# Patient Record
Sex: Male | Born: 1955 | Race: White | Hispanic: No | Marital: Single | Smoking: Former smoker
Health system: Southern US, Community
[De-identification: ages and names within clinical notes are randomized; demographics above are authoritative.]

## PROBLEM LIST (undated history)

## (undated) DIAGNOSIS — A159 Respiratory tuberculosis unspecified: Secondary | ICD-10-CM

## (undated) DIAGNOSIS — Z9221 Personal history of antineoplastic chemotherapy: Secondary | ICD-10-CM

## (undated) DIAGNOSIS — I252 Old myocardial infarction: Secondary | ICD-10-CM

## (undated) DIAGNOSIS — IMO0002 Reserved for concepts with insufficient information to code with codable children: Secondary | ICD-10-CM

## (undated) DIAGNOSIS — I2699 Other pulmonary embolism without acute cor pulmonale: Secondary | ICD-10-CM

## (undated) DIAGNOSIS — Z9981 Dependence on supplemental oxygen: Secondary | ICD-10-CM

## (undated) DIAGNOSIS — J45909 Unspecified asthma, uncomplicated: Secondary | ICD-10-CM

## (undated) DIAGNOSIS — IMO0001 Reserved for inherently not codable concepts without codable children: Secondary | ICD-10-CM

## (undated) DIAGNOSIS — I2510 Atherosclerotic heart disease of native coronary artery without angina pectoris: Secondary | ICD-10-CM

## (undated) DIAGNOSIS — C3490 Malignant neoplasm of unspecified part of unspecified bronchus or lung: Secondary | ICD-10-CM

## (undated) DIAGNOSIS — E669 Obesity, unspecified: Secondary | ICD-10-CM

## (undated) DIAGNOSIS — T66XXXA Radiation sickness, unspecified, initial encounter: Secondary | ICD-10-CM

## (undated) DIAGNOSIS — E785 Hyperlipidemia, unspecified: Secondary | ICD-10-CM

## (undated) DIAGNOSIS — K208 Other esophagitis without bleeding: Secondary | ICD-10-CM

## (undated) DIAGNOSIS — N189 Chronic kidney disease, unspecified: Secondary | ICD-10-CM

## (undated) DIAGNOSIS — I214 Non-ST elevation (NSTEMI) myocardial infarction: Secondary | ICD-10-CM

## (undated) DIAGNOSIS — E119 Type 2 diabetes mellitus without complications: Secondary | ICD-10-CM

## (undated) DIAGNOSIS — I10 Essential (primary) hypertension: Secondary | ICD-10-CM

## (undated) DIAGNOSIS — R229 Localized swelling, mass and lump, unspecified: Secondary | ICD-10-CM

## (undated) DIAGNOSIS — Z8673 Personal history of transient ischemic attack (TIA), and cerebral infarction without residual deficits: Secondary | ICD-10-CM

## (undated) DIAGNOSIS — J189 Pneumonia, unspecified organism: Secondary | ICD-10-CM

## (undated) DIAGNOSIS — J449 Chronic obstructive pulmonary disease, unspecified: Secondary | ICD-10-CM

## (undated) HISTORY — DX: Reserved for concepts with insufficient information to code with codable children: IMO0002

## (undated) HISTORY — DX: Localized swelling, mass and lump, unspecified: R22.9

## (undated) HISTORY — DX: Hyperlipidemia, unspecified: E78.5

## (undated) HISTORY — DX: Other pulmonary embolism without acute cor pulmonale: I26.99

## (undated) HISTORY — DX: Atherosclerotic heart disease of native coronary artery without angina pectoris: I25.10

## (undated) HISTORY — DX: Old myocardial infarction: I25.2

## (undated) HISTORY — DX: Type 2 diabetes mellitus without complications: E11.9

## (undated) HISTORY — DX: Non-ST elevation (NSTEMI) myocardial infarction: I21.4

## (undated) HISTORY — DX: Personal history of transient ischemic attack (TIA), and cerebral infarction without residual deficits: Z86.73

## (undated) HISTORY — DX: Other esophagitis: K20.8

## (undated) HISTORY — DX: Chronic obstructive pulmonary disease, unspecified: J44.9

## (undated) HISTORY — DX: Obesity, unspecified: E66.9

## (undated) HISTORY — DX: Malignant neoplasm of unspecified part of unspecified bronchus or lung: C34.90

## (undated) HISTORY — DX: Dependence on supplemental oxygen: Z99.81

## (undated) HISTORY — DX: Chronic kidney disease, unspecified: N18.9

## (undated) HISTORY — DX: Other esophagitis without bleeding: K20.80

## (undated) HISTORY — DX: Radiation sickness, unspecified, initial encounter: T66.XXXA

---

## 1997-08-23 ENCOUNTER — Emergency Department (HOSPITAL_COMMUNITY): Admission: EM | Admit: 1997-08-23 | Discharge: 1997-08-23 | Payer: Self-pay | Admitting: Emergency Medicine

## 1998-11-12 ENCOUNTER — Encounter: Payer: Self-pay | Admitting: Emergency Medicine

## 1998-11-12 ENCOUNTER — Emergency Department (HOSPITAL_COMMUNITY): Admission: EM | Admit: 1998-11-12 | Discharge: 1998-11-12 | Payer: Self-pay | Admitting: Emergency Medicine

## 2001-12-10 DIAGNOSIS — I679 Cerebrovascular disease, unspecified: Secondary | ICD-10-CM | POA: Insufficient documentation

## 2001-12-28 ENCOUNTER — Inpatient Hospital Stay (HOSPITAL_COMMUNITY): Admission: EM | Admit: 2001-12-28 | Discharge: 2001-12-30 | Payer: Self-pay | Admitting: *Deleted

## 2001-12-28 ENCOUNTER — Encounter: Payer: Self-pay | Admitting: Pediatrics

## 2001-12-28 ENCOUNTER — Encounter: Payer: Self-pay | Admitting: Emergency Medicine

## 2001-12-28 ENCOUNTER — Encounter (INDEPENDENT_AMBULATORY_CARE_PROVIDER_SITE_OTHER): Payer: Self-pay | Admitting: Cardiology

## 2001-12-30 ENCOUNTER — Encounter: Payer: Self-pay | Admitting: Internal Medicine

## 2005-05-12 DIAGNOSIS — Z8673 Personal history of transient ischemic attack (TIA), and cerebral infarction without residual deficits: Secondary | ICD-10-CM

## 2005-05-12 HISTORY — DX: Personal history of transient ischemic attack (TIA), and cerebral infarction without residual deficits: Z86.73

## 2005-07-23 DIAGNOSIS — Z87891 Personal history of nicotine dependence: Secondary | ICD-10-CM | POA: Insufficient documentation

## 2005-07-23 DIAGNOSIS — J45909 Unspecified asthma, uncomplicated: Secondary | ICD-10-CM | POA: Insufficient documentation

## 2006-08-05 ENCOUNTER — Ambulatory Visit: Payer: Self-pay | Admitting: Cardiology

## 2006-08-05 ENCOUNTER — Inpatient Hospital Stay (HOSPITAL_COMMUNITY): Admission: EM | Admit: 2006-08-05 | Discharge: 2006-08-08 | Payer: Self-pay | Admitting: Emergency Medicine

## 2006-08-17 ENCOUNTER — Ambulatory Visit: Payer: Self-pay | Admitting: Cardiology

## 2006-10-17 ENCOUNTER — Ambulatory Visit: Payer: Self-pay | Admitting: Cardiology

## 2006-10-17 ENCOUNTER — Other Ambulatory Visit: Payer: Self-pay

## 2006-10-17 ENCOUNTER — Inpatient Hospital Stay: Payer: Self-pay | Admitting: Internal Medicine

## 2006-11-16 ENCOUNTER — Ambulatory Visit: Payer: Self-pay | Admitting: Cardiology

## 2007-05-13 HISTORY — PX: CORONARY STENT PLACEMENT: SHX1402

## 2007-05-13 HISTORY — PX: CARDIAC CATHETERIZATION: SHX172

## 2007-07-08 ENCOUNTER — Ambulatory Visit: Payer: Self-pay | Admitting: Cardiology

## 2008-01-04 ENCOUNTER — Ambulatory Visit: Payer: Self-pay | Admitting: Cardiology

## 2008-10-23 ENCOUNTER — Ambulatory Visit: Payer: Self-pay | Admitting: Cardiology

## 2008-10-23 ENCOUNTER — Inpatient Hospital Stay: Payer: Self-pay | Admitting: Internal Medicine

## 2008-11-02 DIAGNOSIS — I10 Essential (primary) hypertension: Secondary | ICD-10-CM

## 2008-11-03 ENCOUNTER — Encounter: Payer: Self-pay | Admitting: Cardiology

## 2008-11-03 ENCOUNTER — Ambulatory Visit: Payer: Self-pay | Admitting: Cardiology

## 2008-11-03 DIAGNOSIS — E785 Hyperlipidemia, unspecified: Secondary | ICD-10-CM | POA: Insufficient documentation

## 2008-11-03 DIAGNOSIS — Z9861 Coronary angioplasty status: Secondary | ICD-10-CM

## 2008-11-03 DIAGNOSIS — I2510 Atherosclerotic heart disease of native coronary artery without angina pectoris: Secondary | ICD-10-CM

## 2008-11-20 ENCOUNTER — Encounter: Payer: Self-pay | Admitting: Cardiovascular Disease

## 2008-11-24 ENCOUNTER — Telehealth: Payer: Self-pay | Admitting: Cardiology

## 2008-12-04 ENCOUNTER — Telehealth: Payer: Self-pay | Admitting: Cardiology

## 2008-12-29 ENCOUNTER — Telehealth: Payer: Self-pay | Admitting: Cardiology

## 2009-03-05 ENCOUNTER — Telehealth: Payer: Self-pay | Admitting: Cardiology

## 2009-03-26 ENCOUNTER — Telehealth: Payer: Self-pay | Admitting: Cardiology

## 2009-07-02 ENCOUNTER — Telehealth: Payer: Self-pay | Admitting: Cardiology

## 2009-07-11 ENCOUNTER — Telehealth: Payer: Self-pay | Admitting: Cardiology

## 2009-09-07 ENCOUNTER — Ambulatory Visit: Payer: Self-pay | Admitting: Cardiology

## 2009-09-12 ENCOUNTER — Ambulatory Visit: Payer: Self-pay | Admitting: Cardiology

## 2009-09-12 DIAGNOSIS — E119 Type 2 diabetes mellitus without complications: Secondary | ICD-10-CM | POA: Insufficient documentation

## 2009-09-18 LAB — CONVERTED CEMR LAB
ALT: 16 units/L (ref 0–53)
AST: 14 units/L (ref 0–37)
Cholesterol: 105 mg/dL (ref 0–200)
Creatinine, Ser: 1.58 mg/dL — ABNORMAL HIGH (ref 0.40–1.50)
HDL: 30 mg/dL — ABNORMAL LOW (ref >39)
Hgb A1c MFr Bld: 7.1 % — ABNORMAL HIGH (ref <5.7)
Total Bilirubin: 0.4 mg/dL (ref 0.3–1.2)
Total CHOL/HDL Ratio: 3.5
VLDL: 22 mg/dL (ref 0–40)

## 2009-10-06 ENCOUNTER — Ambulatory Visit: Payer: Self-pay | Admitting: Critical Care Medicine

## 2009-10-06 ENCOUNTER — Inpatient Hospital Stay (HOSPITAL_COMMUNITY): Admission: EM | Admit: 2009-10-06 | Discharge: 2009-10-11 | Payer: Self-pay | Admitting: Emergency Medicine

## 2009-10-09 ENCOUNTER — Encounter: Payer: Self-pay | Admitting: Cardiology

## 2009-10-10 ENCOUNTER — Encounter (INDEPENDENT_AMBULATORY_CARE_PROVIDER_SITE_OTHER): Payer: Self-pay | Admitting: Internal Medicine

## 2009-11-14 ENCOUNTER — Ambulatory Visit: Payer: Self-pay | Admitting: Internal Medicine

## 2009-11-14 DIAGNOSIS — Z87891 Personal history of nicotine dependence: Secondary | ICD-10-CM

## 2009-11-19 ENCOUNTER — Telehealth (INDEPENDENT_AMBULATORY_CARE_PROVIDER_SITE_OTHER): Payer: Self-pay | Admitting: *Deleted

## 2009-11-21 ENCOUNTER — Telehealth: Payer: Self-pay | Admitting: Cardiovascular Disease

## 2009-11-22 ENCOUNTER — Ambulatory Visit: Payer: Self-pay | Admitting: Cardiovascular Disease

## 2009-11-23 ENCOUNTER — Encounter: Payer: Self-pay | Admitting: Cardiovascular Disease

## 2009-11-29 ENCOUNTER — Telehealth: Payer: Self-pay | Admitting: Internal Medicine

## 2009-12-06 ENCOUNTER — Ambulatory Visit: Payer: Self-pay | Admitting: Internal Medicine

## 2009-12-31 ENCOUNTER — Telehealth (INDEPENDENT_AMBULATORY_CARE_PROVIDER_SITE_OTHER): Payer: Self-pay | Admitting: *Deleted

## 2010-01-03 ENCOUNTER — Ambulatory Visit: Payer: Self-pay | Admitting: Internal Medicine

## 2010-01-07 ENCOUNTER — Ambulatory Visit: Payer: Self-pay | Admitting: Internal Medicine

## 2010-01-10 ENCOUNTER — Telehealth: Payer: Self-pay | Admitting: Internal Medicine

## 2010-01-17 ENCOUNTER — Ambulatory Visit (HOSPITAL_COMMUNITY): Admission: RE | Admit: 2010-01-17 | Discharge: 2010-01-17 | Payer: Self-pay | Admitting: Internal Medicine

## 2010-01-17 ENCOUNTER — Telehealth: Payer: Self-pay | Admitting: Internal Medicine

## 2010-01-23 ENCOUNTER — Telehealth: Payer: Self-pay | Admitting: Internal Medicine

## 2010-01-24 ENCOUNTER — Telehealth: Payer: Self-pay | Admitting: Internal Medicine

## 2010-01-25 ENCOUNTER — Telehealth: Payer: Self-pay | Admitting: Internal Medicine

## 2010-01-25 ENCOUNTER — Encounter: Payer: Self-pay | Admitting: Internal Medicine

## 2010-01-30 ENCOUNTER — Telehealth: Payer: Self-pay | Admitting: Internal Medicine

## 2010-02-01 ENCOUNTER — Telehealth: Payer: Self-pay | Admitting: Internal Medicine

## 2010-02-01 ENCOUNTER — Ambulatory Visit: Payer: Self-pay | Admitting: Internal Medicine

## 2010-02-06 ENCOUNTER — Ambulatory Visit (HOSPITAL_COMMUNITY): Admission: RE | Admit: 2010-02-06 | Discharge: 2010-02-06 | Payer: Self-pay | Admitting: Emergency Medicine

## 2010-02-08 ENCOUNTER — Telehealth: Payer: Self-pay | Admitting: Emergency Medicine

## 2010-02-11 ENCOUNTER — Telehealth: Payer: Self-pay | Admitting: Internal Medicine

## 2010-02-20 ENCOUNTER — Telehealth: Payer: Self-pay | Admitting: Internal Medicine

## 2010-02-25 ENCOUNTER — Telehealth: Payer: Self-pay | Admitting: Internal Medicine

## 2010-03-01 ENCOUNTER — Encounter: Payer: Self-pay | Admitting: Internal Medicine

## 2010-03-01 ENCOUNTER — Telehealth: Payer: Self-pay | Admitting: Internal Medicine

## 2010-03-04 ENCOUNTER — Ambulatory Visit: Payer: Self-pay | Admitting: Cardiology

## 2010-03-07 ENCOUNTER — Ambulatory Visit: Payer: Self-pay | Admitting: Internal Medicine

## 2010-03-19 ENCOUNTER — Encounter: Payer: Self-pay | Admitting: Internal Medicine

## 2010-03-19 ENCOUNTER — Telehealth: Payer: Self-pay | Admitting: Cardiology

## 2010-03-19 ENCOUNTER — Ambulatory Visit: Payer: Self-pay | Admitting: Thoracic Surgery

## 2010-03-28 HISTORY — PX: WEDGE RESECTION: SHX5070

## 2010-03-29 ENCOUNTER — Encounter: Payer: Self-pay | Admitting: Thoracic Surgery

## 2010-03-29 ENCOUNTER — Ambulatory Visit: Payer: Self-pay | Admitting: Thoracic Surgery

## 2010-03-29 ENCOUNTER — Encounter: Payer: Self-pay | Admitting: Internal Medicine

## 2010-03-29 ENCOUNTER — Inpatient Hospital Stay (HOSPITAL_COMMUNITY): Admission: RE | Admit: 2010-03-29 | Discharge: 2010-04-03 | Payer: Self-pay | Admitting: Thoracic Surgery

## 2010-04-09 ENCOUNTER — Telehealth: Payer: Self-pay | Admitting: Internal Medicine

## 2010-04-10 ENCOUNTER — Encounter: Admission: RE | Admit: 2010-04-10 | Discharge: 2010-04-10 | Payer: Self-pay | Admitting: Thoracic Surgery

## 2010-04-10 ENCOUNTER — Ambulatory Visit: Payer: Self-pay | Admitting: Thoracic Surgery

## 2010-04-16 ENCOUNTER — Ambulatory Visit: Payer: Self-pay | Admitting: Cardiology

## 2010-05-01 ENCOUNTER — Encounter: Admission: RE | Admit: 2010-05-01 | Payer: Self-pay | Source: Home / Self Care | Admitting: Thoracic Surgery

## 2010-05-01 ENCOUNTER — Ambulatory Visit: Payer: Self-pay | Admitting: Thoracic Surgery

## 2010-05-02 ENCOUNTER — Ambulatory Visit: Payer: Self-pay | Admitting: Internal Medicine

## 2010-05-14 ENCOUNTER — Ambulatory Visit: Admit: 2010-05-14 | Payer: Self-pay

## 2010-06-11 ENCOUNTER — Other Ambulatory Visit: Payer: Self-pay | Admitting: Thoracic Surgery

## 2010-06-11 DIAGNOSIS — R0789 Other chest pain: Secondary | ICD-10-CM

## 2010-06-11 DIAGNOSIS — D381 Neoplasm of uncertain behavior of trachea, bronchus and lung: Secondary | ICD-10-CM

## 2010-06-12 ENCOUNTER — Ambulatory Visit: Admit: 2010-06-12 | Payer: Self-pay | Admitting: Thoracic Surgery

## 2010-06-12 ENCOUNTER — Ambulatory Visit: Payer: 59 | Admitting: Thoracic Surgery

## 2010-06-12 ENCOUNTER — Ambulatory Visit
Admission: RE | Admit: 2010-06-12 | Discharge: 2010-06-12 | Disposition: A | Discharge status: Home / Self Care | Payer: 59 | Source: Ambulatory Visit | Attending: Thoracic Surgery | Admitting: Thoracic Surgery

## 2010-06-12 DIAGNOSIS — J8410 Pulmonary fibrosis, unspecified: Secondary | ICD-10-CM

## 2010-06-12 DIAGNOSIS — D381 Neoplasm of uncertain behavior of trachea, bronchus and lung: Secondary | ICD-10-CM

## 2010-06-13 NOTE — Progress Notes (Signed)
Summary: CPST ok  and needs ENB-  Phone Note Outgoing Call   Summary of Call: I gave cPST report. Acceptable lobecotmy canddiate. D/w Dr. Edwyna Shell who will see scan and decide if ENB First or direct lobectomy first. If this is cancer, patient prefers lobectomy. I will call patient back once I hear back from Dr. Edwyna Shell Initial call taken by: Kalman Shan MD,  February 01, 2010 2:53 PM  Follow-up for Phone Call        heard back from Dr. Edwyna Shell - he wants [place] to ENB bx on wednesday 02/06/2010. Patient should hear from [facility]. Pls check with Forestine Na if she has patient details and if he is all set Follow-up by: Kalman Shan MD,  February 04, 2010 9:15 AM  Additional Follow-up for Phone Call Additional follow up Details #1::        I spoke to pt and he staets he has not heard from anyone regarding biopsy. I advised I will call Forestine Na and find out when pt will be contacted. Carron Curie CMA  February 05, 2010 8:20 AM  Pt called back again. wants to hear back from someone ASAP regarding same. [phone]. Tivis Ringer, CNA  February 05, 2010 1:02 PM    Additional Follow-up for Phone Call Additional follow up Details #2::    Spoke to [facility] and she states I need to contact Short Stay at [phone] to ask about pre-op and appt time. I called and they instructed me that pre-op is today at 3pm and procedure is tomorrow at 8:30am. I called pt and made him aware of each appt.Carron Curie, 02-05-10 at 1:18pm.

## 2010-06-13 NOTE — Letter (Signed)
Summary: CPST [facility] Pul[facility]  [address]   [place], [place] [address]   Phone: [phone]  Fax: [phone]     Patient's Name: [name] Date of Birth: [birth year] MRN: [id number]  CPST  Choose test method and choice  a)__x_Bike - recommended by ATS/ACCP. Do at [facility] at Dr. Gala Romney [facility]  b)___Treadmill - less preferred. Do at [facility] at Dr. Gala Romney lab or do at [facility] lab  Choose one or more indication for test  INDICATIONS FOR CARDIOPULMONARY EXERCISE TESTING Evaluation of exercise tolerance ______ Determination of functional impairment or capacity (peak V? O2) ______ Determination of exercise-limiting factors and pathophysiologic mechanisms  Evaluation of undiagnosed exercise intolerance _____ Assessing contribution of cardiac and pulmonary etiology in coexisting disease _____ Symptoms disproportionate to resting pulmonary and cardiac tests  _____Unexplained dyspnea when initial cardiopulmonary testing is nondiagnostic  Evaluation of patients with cardiovascular disease _____ Functional evaluation and prognosis in patients with heart failure _____ Selection for cardiac transplantation _____ Exercise prescription and monitoring response to exercise training for cardiac rehabilitation (special circumstances; i.e., pacemakers)  Evaluation of patients with respiratory disease _____ Functional impairment assessment (see specific clinical applications)  _____Chronic obstructive pulmonary disease Establishing exercise limitation(s) and assessing other potential contributing factors, especially occult heart disease (ischemia) ______Determination of magnitude of hypoxemia and for O2 prescription When objective determination of therapeutic intervention is necessary and not adequately addressed by standard pulmonary function testing  _____ Interstitial lung diseases _____Detection of early (occult) gas exchange abnormalities _____Overall  assessment/monitoring of pulmonary gas exchange _____Determination of magnitude of hypoxemia and for O2 prescription _____Determination of potential exercise-limiting factors _____Documentation of therapeutic response to potentially toxic therapy  ____ Pulmonary vascular disease (careful risk-benefit analysis required)  ____ Cystic fibrosis  ____ Exercise-induced bronchospasm  Specific clinical applications ___x_  Preoperative evaluation __x___Lung resectional surgery _____Elderly patients undergoing major abdominal surgery _____Lung volume resectional surgery for emphysema (currently investigational)  ____ Exercise evaluation and prescription for pulmonary rehabilitation  _x___ Evaluation for impairment-disability  ____ Evaluation for lung, heart-lung transplantation ____ Definition of abbreviation: V? O2______ -oxygen consumption.    Kalman Shan MD    [facility]

## 2010-06-13 NOTE — Progress Notes (Signed)
Summary: needs PET scan  Phone Note Outgoing Call   Summary of Call: I called him to let him know that we discused at [facility] today and they recommended PET scan. He was not at home and [facility]. If he calls back, pls state the above. I hve placed order inPET Scan. I am sending this message to Lorenda Ishihara, [facility] and triage so all of you know and there is no confusion. '  If he wants to talk to me, pls get a cell number and I can call him late afternoon today Initial call taken by: Kalman Shan MD,  January 10, 2010 10:08 AM  Follow-up for Phone Call        attempted to reach patient agin. LMTCB. Needs PET scan. Order done Follow-up by: Kalman Shan MD,  January 11, 2010 3:31 PM  Additional Follow-up for Phone Call Additional follow up Details #1::        pt aware of PET scan.Carron Curie CMA  January 16, 2010 1:10 PM

## 2010-06-13 NOTE — Assessment & Plan Note (Signed)
Summary: 1 mth follow-up//jrc   Visit Type:  Follow-up Copy to:  Dr. [name] Primary [name]/Referring [name]:  Kevin Merry, MD  CC:  1 month follow-up after procedure. Marland Kitchen  History of Present Illness: RUL PET HOT intermediate prob nodule - ENB nondiagnostic 02/06/2010.  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 Marland Kitchen EX-smoker - quit may 2011. Fnctional status  - Vo2 max 32ml/kg/min on CPST 02/01/2010   March 07, 2010. Follow up after biipsy and tests. ENB 02/06/2010 was indeterminate. Oncimmune panel 03/01/2010 ws negagtive. He feels well and at baseline. He is very frustrated about the nodule. He understands the non-diagnostic results of the biopsy and uncertainty. States family friend was monitored for years for nodule and it ended up with stage 4 cancer and a close relative was operated upon for nodule and was negative for malignancy. Therefore, he is torn if he wants this monitored or removed. He is aware of postoperative risk morbidity and mortality and potential need for O2 and loss of lung function if he has lobectomey. No new complaints.  REC: EVAL WITH DR Edwyna Shell   May 02, 2010: Foloowup for above. s/p RUL wede resection 03/28/2010. 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 We dicussed potential etiologies for granuloma. He admits to extensive travel to [place], [place] few years ago. Currenty feels well other than neuropathic  pan from incisional site. Dyspnea, cough are stable. No new issues. Smoking still in remission. No fever. No chills, No hemoptysis   Preventive Screening-Counseling & Management  Alcohol-Tobacco     Smoking Status: quit < 6 months     Smoking Cessation Counseling: no     Smoke Cessation Stage: quit     Packs/Day: 1.0     Year Started: 1963     Year Quit: May 2011     Pack years: 55     Tobacco Counseling: not to resume use of tobacco products  Current Medications (verified): 1)  Lipitor  40 Mg Tabs (Atorvastatin Calcium) .... Take 1 Tab By Mouth At Bedtime 2)  Aspirin Ec 325 Mg Tbec (Aspirin) .... Take One Tablet By Mouth Daily 3)  Hydrochlorothiazide 12.5 Mg Caps (Hydrochlorothiazide) .... Take 1 Tablet By Mouth Once A Day 4)  Plavix 75 Mg Tabs (Clopidogrel Bisulfate) .... Take One Tablet By Mouth Daily 5)  Metoprolol Tartrate 25 Mg Tabs (Metoprolol Tartrate) .... Take One Tablet By Mouth Twice A Day 6)  Glipizide 5 Mg Tabs (Glipizide) .... Take 1 Tablet By Mouth Two Times A Day 7)  Nitrostat 0.4 Mg Subl (Nitroglycerin) .Marland Kitchen.. 1 Tablet Under Tongue At Onset of Chest Pain; You May Repeat Every 5 Minutes For Up To 3 Doses. 8)  Ventolin Hfa 108 (90 Base) Mcg/act Aers (Albuterol Sulfate) .Marland Kitchen.. 1-2 Puffs Four Times Daily As Needed 9)  Benicar 20 Mg Tabs (Olmesartan Medoxomil) .... Take 1 Tablet By Mouth Once A Day 10)  Atrovent Hfa 17 Mcg/act Aers (Ipratropium Bromide Hfa) .... 2 Puffs Four Times Daily  Allergies (verified): 1)  ! Pcn  Past History:  Past medical, surgical, family and social histories (including risk factors) reviewed, and no changes noted (except as noted below).  Past Medical History: 1.  CAD:  PCI 3/08 to OM2 and mid CFX.  NSTEMI 6/10.  Prior stents patent.  90% distal CFX, totally occluded mRCA (old) with collaterals.  EF 55% on LV-gram.  Xience DES 2.5 x 23 mm to distal CFX.  2.  DM2 3.  Hyperlipidemia 4.  COPD :   - quit tobacco 5/11.    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 Marchelle Gearing - Aug 2011 - started on atrovent HFA - fall 2011. No desaturation on walk test Dec 2011 5.  CKD, last creatinine 1.6 6.  obesity 7..  History of CVA without residual deficits 8.  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  -  Rx  for CAP end May 2011  - Persistent and PET positive - Aug 2011  - ENB Bx 02/06/2010 - indeterminate  - Onciummne lung cancer antigen panel - Negative  03/01/2010 (test of poor sensitivity)    - S/p wedge resetion bx 03/28/2010 - Necrotizing granuloma without lymph node involvement   Past Surgical History: Reviewed history from 11/14/2009 and no changes required. stents  Past Pulmonary History:  Pulmonary History:  PRIMARY DISCHARGE DIAGNOSIS: 10/06/2009  - 10/11/2009  1. Atypical pneumonia.   2. Bronchospasm questionable underlying asthma with chronic       obstructive pulmonary disease.      SECONDARY DISCHARGE DIAGNOSES:   1. History of cerebrovascular accident in 2003.   2. History of coronary artery disease status post percutaneous       transluminal coronary angioplasty in  March 2008.   3. Diabetes.   4. Hypertension.   5. Chronic kidney disease stage two.   6. Tobacco abuse.   7. Asthma as a child.  Family History: Reviewed history from 11/03/2008 and no changes required. Family history of stroke.   Social History: Reviewed history from 03/04/2010 and no changes required. 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  Quit in May 2011.  1ppd x 38 yrs Alcohol Use - yes Regular Exercise - yes Drug Use - no  Review of Systems       The patient complains of chest pain.  The patient denies shortness of breath with activity, shortness of breath at rest, productive cough, non-productive cough, coughing up blood, irregular heartbeats, acid heartburn, indigestion, loss of appetite, weight change, abdominal pain, difficulty swallowing, sore throat, tooth/dental problems, headaches, nasal congestion/difficulty breathing through nose, sneezing, itching, ear ache, anxiety, depression, hand/feet swelling, joint stiffness or pain, rash, change in color of mucus, and fever.    Vital Signs:  Patient  profile:   55 year old male Height:      71 inches Weight:      248.25 pounds BMI:     34.75 O2 Sat:      94 % on Room air Temp:     98.1 degrees F oral Pulse rate:   71 / minute BP sitting:   104 / 70  (right arm) Cuff size:   regular  Vitals Entered By: [name] CMA (May 02, 2010 9:25 AM)  O2 Flow:  Room air  Serial Vital Signs/Assessments:  Comments: Ambulatory Pulse Oximetry  Resting; HR__80___    02 Sat__93___  Lap1 (185 feet)   HR_89____   02 Sat_93____ Lap2 (185 feet)   HR_97____   02 Sat_90____    Lap3 (185 feet)   HR__100___   02 Sat_91____  _x__Test Completed without Difficulty ___Test Stopped  due to:   By: [name] CMA   CC: 1 month follow-up after procedure.  Comments Medications reviewed with patient [name] CMA  May 02, 2010 9:25 AM Daytime phone number verified with patient.    Physical Exam  General:  normal appearance, healthy appearing, and obese.   Head:  normocephalic and atraumatic Eyes:  PERRLA/EOM intact; conjunctiva and sclera clear Ears:  TMs intact and clear with normal canals Nose:  no deformity, discharge, inflammation, or lesions Mouth:  no deformity or lesions Neck:  no masses, thyromegaly, or abnormal cervical nodes Chest Wall:  mild right shoulder droop following wedge resection scar from recent surgery - mildly tender and mild induration + Lungs:  clear bilaterally to auscultation and percussion Heart:  regular rate and rhythm, S1, S2 without murmurs, rubs, gallops, or clicks Abdomen:  bowel sounds positive; abdomen soft and non-tender without masses, or organomegaly Msk:  no deformity or scoliosis noted with normal posture Pulses:  pulses normal Extremities:  no clubbing, cyanosis, edema, or deformity noted Neurologic:  CN II-XII grossly intact with normal reflexes, coordination, muscle strength and tone Skin:  intact without lesions or rashes Cervical Nodes:  no significant  adenopathy Axillary Nodes:  no significant adenopathy Psych:  alert and cooperative; normal mood and affect; normal attention span and concentration   CXR  Procedure date:  05/01/2010  Findings:        Clinical Data: Right VATS.    CHEST - 2 VIEW    Comparison: 04/10/2010    Findings: Trachea is midline.  Heart size normal.  There are   postoperative changes and volume loss in the right hemithorax,   stable.  Probable lingular scarring or atelectasis.  No pleural   fluid.  There are degenerative changes in the spine.    IMPRESSION:   Postoperative changes and volume loss in the right hemithorax,   stable.    Read By:  Kevin Ivan.,  M.D.   Released By:  Kevin Ivan.,  M.D.  MISC. Report  Procedure date:  03/29/2010  Findings:      FINAL DIAGNOSIS    1. 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    2. Lymph node, biopsy, 10 R :   ANTHRACOTIC LYMPH NODE.NO GRANULOMAS OR MALIGNANCY   3. Lymph node, biopsy, 10 R #2 :   BENIGN ADIPOSE TISSUE.NO LYMPH NODE TISSUE, GRANULOMAS OR MALIGNANCY.   4. Lymph node, biopsy, 11 R :   ANTHRACOTIC LYMPH NODE.NO GRANULOMAS OR MALIGNANCY.   CORRECTED   [id number]: Special stain results.    ELECTRONIC SIGNATURE : [name], John, Pathologist, Electronic Signature   Impression & Recommendations:  Problem # 1:  CHRONIC OBSTRUCTIVE PULMONARY DISEASE, SEVERE (ICD-496) Assessment Unchanged stable disease withou desaturation on exertion. Unwilling to attend rehab due to $ issues.  plan continue atroven hfa monitor smoking to stay in remission  Problem # 2:  PULMONARY NODULE, RIGHT UPPER LOBE (ICD-518.89) Assessment: Improved  This is a necrotizing granuloma. I am suspecting cocci from trips to [facility] few years ago.   plan reassure place TB skin test just to be on safe side (has hx of TB exposure as a child but TB skin test negatie at that  time)  Orders: Est. Patient Level III ([address])  Patient Instructions: 1)  #RIght lung nodule/granuloma 2)   - this is very reassuring it is granuloma 3)   - at next visit we wil place a TB skin test  4)   - no interventions curently 5)  #COPD 6)   - continue atrovent 7)   - try to gently exercise as allowd 8)  #RETURN  9)  -  6 months, come on any day except thursday 10)   - TB skin test at followup

## 2010-06-13 NOTE — Progress Notes (Signed)
Summary: RX   Phone Note Refill Request Call back at Home Phone [phone] Message from:  Patient on March 19, 2010 2:40 PM  Refills Requested: Medication #1:  PLAVIX 75 MG TABS Take one tablet by mouth daily [facility]  Initial call taken by: Harlon Flor,  March 19, 2010 2:41 PM    Prescriptions: PLAVIX 75 MG TABS (CLOPIDOGREL BISULFATE) Take one tablet by mouth daily  #30 x 6   Entered by:   Bishop Dublin, CMA   Authorized by:   [name], MD   Signed by:   Bishop Dublin, CMA on 03/19/2010   Method used:   Electronically to        [facility] [address]* (retail)       [address]       [place], [place]  [address]       Ph: [phone]       Fax: [phone]   RxID:   [id number]

## 2010-06-13 NOTE — Consult Note (Signed)
Summary: Consultation Report  Consultation Report   Imported By: Debby Freiberg 11/13/2009 13:05:45  _____________________________________________________________________  External Attachment:    Type:   Image     Comment:   External Document

## 2010-06-13 NOTE — Letter (Signed)
Summary: CPST- R/O Contraindications  [facility]  [address]   [place], [place] [address]   Phone: [phone]  Fax: [phone]    Patient's Name: [name] Date of Birth: 1955/06/26 MRN: [id number]  *********Rule out Contraindications**************** Absolute                                                                                                                           ___ Acute MI (3-5 Days)                                 ___ Unstable Angina                                          ___ Uncontrolled arrhythmias causing symptoms or hemodynamic compromise. ___ Syncope                                                     ___ Active endocarditis                                         ___ Acute Myocarditis/Pericarditis                        ___ Symptomatic severe aortic stenosis  ___ Acute Pulmonary embolus or pulmonary infarction                ___ Uncontrolled Heart Failure  ___ Thrombosis of lower extremitie ___ Suspected dissecting aneurysm ___ Uncontrolled Asthma                          ___ Pulmonary Edema                                        ___ RA desat @ rest<85%                                      ___ Repiratory Failure                                         ___ Acute noncardiopulmonary disorder that may affect exercise performance or be         aggravated by exercise (ie infection, renal failure,  thyrotoxicosis) .                               ___ Mental impairment leading to inabliity to cooperate   Relative ___ Left main coronary stenosis or its equivalent ___ Moderate stentoic valvular heart disease ___ Severe untreated arterial hypertension @ rest (<200 mmHg             systolic,>137mmHg Diastolic ___ Tachy/Brady Arrhythmias ___ High- degree artioventricular block ___ Hypertrophic cardiomyopathy ___ Significant pulmonary hypertension ___ Advanced or complicated pregnancy ___ Electrolyte abnormalities ___ Orthopedic impairment that  compromises exercise performance  NO CONTRAINDICATIONS FROM ABOVE BUT KNOWN CAD WITH STENT. GOOD FUNCTIONAL ADL'S CURRENTLY   Kalman Shan MD    [facility]

## 2010-06-13 NOTE — Progress Notes (Signed)
Summary: Plavix- BMS (lmtc)   Phone Note Outgoing Call   Call placed by: Sherri Rad, RN, BSN,  November 21, 2009 9:38 AM Call placed to: Patient Summary of Call: I left a message for the pt to call - need to let him know his Plavix is in the office. Sherri Rad, RN, BSN  November 21, 2009 9:38 AM  [facility]. Sherri Rad, RN, BSN  November 21, 2009 5:26 PM   Follow-up for Phone Call        Pt came in the office today to pick up plavix Follow-up by: Hardin Negus, RMA,  November 22, 2009 3:45 PM

## 2010-06-13 NOTE — Progress Notes (Signed)
Summary: RX   Phone Note Call from Patient Call back at [phone]   [name]: SELF Call For: [facility] Summary of Call: PT IS ABOUT TO RUN OUT OF PLAVIX-HAS 2 PILLS LEFT-#[phone] Initial call taken by: Harlon Flor,  July 02, 2009 8:40 AM  Follow-up for Phone Call        plavix is being shipped out today and shall be here in 7 business days, will call pt to let know and put samples up front for pt to pick up.  left message on voice mail for pt to come by. Cala Bradford :) Follow-up by: Mercer Pod,  July 03, 2009 3:50 PM     Appended Document: RX plavix here and ready to be picked up, spoke with pt asn pt aware

## 2010-06-13 NOTE — Progress Notes (Signed)
Summary: returning call  Phone Note Call from Patient Call back at Home Phone [phone]   [name]: Patient Call For: Emaley Applin Summary of Call: Returning Jennifer's call. Initial call taken by: Darletta Moll,  April 09, 2010 4:33 PM  Follow-up for Phone Call        pt wanted to r/s appt from 05-16-09 due to insurance he wanted to have appt in 2011 so appt set for 05-02-10. Carron Curie CMA  April 09, 2010 4:41 PM

## 2010-06-13 NOTE — Progress Notes (Signed)
Summary: bx results  Phone Note Outgoing Call   Call placed by: Leslye Peer MD,  February 08, 2010 1:53 PM Call placed to: Patient Summary of Call: Called pt to notify him that final bx results are not back yet. Reviewed the prelim data - all normal bronchial epithelium. Also reviewed the possible strategies ahead depending on results. he is expecting to hear results and review plans with Dr Marchelle Gearing either today or next week.  Initial call taken by: Leslye Peer MD,  February 08, 2010 2:04 PM

## 2010-06-13 NOTE — Progress Notes (Signed)
Summary: talk to nurse---appt date and time of CT  Phone Note Call from Patient Call back at Home Phone [phone]   [name]: Patient Call For: ramaswamy Summary of Call: Pt states he lost his appt card for his ct scan, wants to know the date, time, and location. Initial call taken by: Darletta Moll,  December 31, 2009 8:55 AM  Follow-up for Phone Call        called and spoke with pt.  informed him CT chest scheduled for 01-03-2010 at 10:00am at [facility]. pt needs to arrive 15 mins prior to appt time.  pt verbalized understanding.  nothing further needed.  Aundra Millet Reynolds LPN  December 31, 2009 9:08 AM

## 2010-06-13 NOTE — Assessment & Plan Note (Signed)
Summary: EPH/AMD      Allergies Added:   Visit Type:  Follow-up Referring Provider:  Dr. [name] Primary Provider:  [name], MD  CC:  post lung sugery. Denies SOB, chest pain, and palpitations.  History of Present Illness: 55 yo with h/o HTN, DM, hyperlipidemia, and smoking who developed NSTEMI in 6/10 returns for followup.  At the time, he was found to have a totally occluded mid RCA (known from prior studies) and a tight distal  CFX stenosis.  A drug-eluting stent was placed in the distal CFX, which was though to be the infarct-related lesion.  EF was preserved on left ventriculogram.  Since I last saw him, patient was hospitalized with COPD exacerbation and was found to have RUL mass.  This was PET positive.  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    Since VATS, he has had soreness in his right chest.  He has lost 9 lbs since last appointment.  BP is stable.  Short of breath with heavy exertion (no worse since VATS).  No exertional chest pain.   Labs (6/10):  Creatinine 1.48, K normal Labs (5/11): K 4, creatinine 1.6, LDL 53, HDL 30  Current Medications (verified): 1)  Lipitor 40 Mg Tabs (Atorvastatin Calcium) .... Take 1 Tab By Mouth At Bedtime 2)  Aspirin Ec 325 Mg Tbec (Aspirin) .... Take One Tablet By Mouth Daily 3)  Hydrochlorothiazide 12.5 Mg Caps (Hydrochlorothiazide) .... Take 1 Tablet By Mouth Once A Day 4)  Plavix 75 Mg Tabs (Clopidogrel Bisulfate) .... Take One Tablet By Mouth Daily 5)  Metoprolol Tartrate 25 Mg Tabs (Metoprolol Tartrate) .... Take One Tablet By Mouth Twice A Day 6)  Glipizide 5 Mg Tabs (Glipizide) .... Take 1 Tablet By Mouth Two Times A Day 7)  Nitrostat 0.4 Mg Subl (Nitroglycerin) .Marland Kitchen.. 1 Tablet Under Tongue At Onset of Chest Pain; You May Repeat Every 5 Minutes For Up To 3 Doses. 8)  Ventolin Hfa 108 (90 Base) Mcg/act Aers (Albuterol Sulfate) .Marland Kitchen.. 1-2 Puffs Four Times Daily As Needed 9)   Benicar 20 Mg Tabs (Olmesartan Medoxomil) .... Take 1 Tablet By Mouth Once A Day 10)  Atrovent Hfa 17 Mcg/act Aers (Ipratropium Bromide Hfa) .... 2 Puffs Four Times Daily  Allergies (verified): 1)  ! Pcn  Past History:  Past Surgical History: Last updated: 11/14/2009 stents  Family History: Last updated: 11/03/2008 Family history of stroke.   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  Quit in May 2011.  1ppd x 38 yrs Alcohol Use - yes Regular Exercise - yes Drug Use - no  Risk Factors: Exercise: yes (03/07/2010)  Risk Factors: Smoking Status: quit < 6 months (03/07/2010) Packs/Day: 1.0 (03/07/2010)  Past Medical History: 1.  CAD:  PCI 3/08 to OM2 and mid CFX.  NSTEMI 6/10.  Prior stents patent.  90% distal CFX, totally occluded mRCA (old) with collaterals.  EF 55% on LV-gram.  Xience DES 2.5 x 23 mm to distal CFX.  2.  DM2 3.  Hyperlipidemia 4.  COPD:   - quit tobacco 5/11.    - Gold Stage 3 with asthma component - Fev1 1.53L/54%, 14% fev1 BD response, DLCO `6/54%- July 2011  - MM genotyple  01/07/2010  - unable to afford spiriva and unwilling to try ics due to prior renal  failure: stated to Dr. Marchelle Gearing - Aug 2011 5.  CKD, last creatinine 1.6 6.  obesity 7..  History of CVA without residual deficits 8.  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  -  Rx for CAP end May 2011  - Persistent and PET positive - Aug 2011  - ENB Bx 02/06/2010 - indeterminate  - Onciummne lung cancer antigen panel - Negative  03/01/2010 (test of poor sensitivity)      Family History: Reviewed history from 11/03/2008 and no changes required. Family history of stroke.   Social History: Reviewed history from 03/04/2010 and no changes required. 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  Quit in May 2011.  1ppd x 38 yrs Alcohol Use - yes Regular Exercise - yes Drug Use - no  Vital Signs:  Patient profile:   55 year old male Height:      71 inches Weight:      242.50 pounds BMI:     33.94 Pulse rate:   96 / minute BP sitting:   108 / 68  (left arm) Cuff size:   regular  Vitals Entered By: Lysbeth Galas CMA (April 16, 2010 1:20 PM)  Physical Exam  General:  Well developed, well nourished, in no acute distress.  Obese.  Neck:  Neck thick, no JVD. No masses, thyromegaly or abnormal cervical nodes. Lungs:  Clear bilaterally to auscultation and percussion. Heart:  Non-displaced PMI, chest non-tender; regular rate and rhythm, S1, S2 without murmurs, rubs or gallops. Carotid upstroke normal, no bruit. Pedals normal pulses. No edema, no varicosities. Abdomen:  Bowel sounds positive; abdomen soft and non-tender without masses, organomegaly, or hernias noted. No hepatosplenomegaly. Extremities:  No clubbing or cyanosis. Neurologic:  Alert and oriented x 3. Psych:  Normal affect.   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  Stable post-operatively except for right-sided chest soreness.   Problem # 2:  CAD, NATIVE VESSEL (ICD-414.01) No ischemic symptoms.  He is doing well post-PCI.  I will have him continue his current medications, including ASA, Plavix, ACEI, beta blocker, and statin.  Given his multiple stents, I will have him stay on Plavix.   Problem # 3:  HYPERLIPIDEMIA-MIXED (ICD-272.4) Goal LDL < 70.  Check lipids/LFTs in 1/12.   Patient Instructions: 1)  Your physician recommends that you schedule a follow-up appointment in: 6 months 2)  Your physician recommends that you return for a FASTING lipid profile: TO BE SCHEDULED Jan 2012 3)  Your physician recommends that you continue on your current  medications as directed. Please refer to the Current Medication list given to you today.

## 2010-06-13 NOTE — Progress Notes (Signed)
Summary: cpst pnm 9/23  Phone Note Outgoing Call   Summary of Call: Mr [name] of CPST at [facility] says he can do test at 7:30am on friday 02/01/2010. He wants you guys to give instructions to report Initial call taken by: Kalman Shan MD,  January 30, 2010 3:13 PM  Follow-up for Phone Call         Per Almyra Free only directions is to tell pt to go to admitting at [facility] 15 minutes before appointment. [id number]. Carron Curie CMA  January 30, 2010 3:41 PM   Additional Follow-up for Phone Call Additional follow up Details #1::        ok Renae Fickle chase is also emailing me saying that to call [phone] which is [facility] and get patient on schedule Additional Follow-up by: Kalman Shan MD,  January 30, 2010 4:23 PM    Additional Follow-up for Phone Call Additional follow up Details #2::    ok pt placed on CPST schedule. order has been faxed. I have LMTCB withthe pt to advise of appt. Carron Curie CMA  January 30, 2010 4:40 PM  PER MR stop plavix today for possible biopsy next week. I called the pt to advise of CPST appt, pt stated he was not sure if he could make it because his office would be short staffed that day. He states he will call back and let me know for sure if ha can do it or not. Pt states he will call back within an hour.  Carron Curie CMA  January 31, 2010 9:26 AM  Pt states he will be at [facility] on 02-01-10. I also advised to stop plavix. pt states he stopped on 01-30-10. Carron Curie CMA  January 31, 2010 10:04 AM

## 2010-06-13 NOTE — Progress Notes (Signed)
Summary: cpst needed, cancel ENB for 9/21  Phone Note Outgoing Call   Summary of Call: [name], CPST cannot be done before 9/21 per text communication with Laymond Purser (he is of today). So, a) he can go back on plavix; b) we will cancel ENB [facility] in OR scheduled for 9/21 wednesday - Dr Delton Coombes is the one who sets this up and I have texted him; c) plan for ENB on 9/28 wednesday - patient to stop plavix on 9/21; d)  Renae Fickle chase will get back to me on monday 9/19 about when he can do CPST but understands that we need it by 9/27 tuesday; e) I will place order for CPST now; F) cOPYING note to DR. Mclean his cardiologist who should be aware that we want patient off plavix in context of drug eluting stent but stent placed in 2008; g) note being sent to Dr. Delton Coombes as well to be in the loop Initial call taken by: Kalman Shan MD,  January 25, 2010 12:56 PM  Follow-up for Phone Call        [facility] I spoke to the pt and advised him of the plan for the CPST before the ENB. I advised him of the tenative dates and that I will call him on Monday with time and date of CPST. I also advised pt to go back on plavix and to stop again on 01-30-10. Pt stated understanding of all of the above. Carron Curie CMA  January 25, 2010 2:20 PM

## 2010-06-13 NOTE — Assessment & Plan Note (Signed)
Summary: NURSE VISIT  Nurse Visit   Vital Signs:  Patient profile:   55 year old male Height:      71 inches Weight:      247 pounds BMI:     34.57 Pulse rate:   83 / minute Pulse rhythm:   irregular BP sitting:   127 / 78  (left arm) Cuff size:   large  Vitals Entered By: Danielle Rankin, CMA (November 22, 2009 4:12 PM)  CC:  pt was walk in states chest pain..EKG done Dr. Mariah Milling reviewed said everything looked fine w/EKG.Jovita Gamma pt option to have lab work done to check cardiac enzymes and pt refused. pt states he would call if pain continues..   Allergies: 1)  ! Pcn

## 2010-06-13 NOTE — Progress Notes (Signed)
Summary: diag code for onc immune test  Phone Note From Other Clinic Call back at [phone]   [name]: [facility]  yvonne Call For: ramaswmay Summary of Call: need pt's diagnosis code. Initial call taken by: Rickard Patience,  February 20, 2010 3:31 PM  Follow-up for Phone Call        Diagnosis codes were left off of form. I apoligized and gave code for COPD and Dyspnea. Carron Curie CMA  February 20, 2010 3:55 PM

## 2010-06-13 NOTE — Progress Notes (Signed)
Summary: prescript  Phone Note Call from Patient Call back at [phone]   [name]: Patient Call For: Debbera Wolken Summary of Call: need prescript for inhaler he had samples only can't remember the name pharmacy [facility] [address] Initial call taken by: Rickard Patience,  November 29, 2009 11:13 AM  Follow-up for Phone Call        called and spoke with pt and he is aware of rx for rescue inhaler has been sent in to his pharmacy Randell Loop CMA  November 29, 2009 11:25 AM     Prescriptions: VENTOLIN HFA 108 (90 BASE) MCG/ACT AERS (ALBUTEROL SULFATE) 1-2 puffs four times daily as needed  #1 x 6   Entered by:   Randell Loop CMA   Authorized by:   Kalman Shan MD   Signed by:   Randell Loop CMA on 11/29/2009   Method used:   Electronically to        [facility] [address]* (retail)       [address]       [place], [place]  [address]       Ph: [phone]       Fax: [phone]   RxID:   [id number]

## 2010-06-13 NOTE — Assessment & Plan Note (Signed)
Summary: 6 month f/u/ewj  Medications Added LIPITOR 40 MG TABS (ATORVASTATIN CALCIUM) Take 1 tab by mouth at bedtime      Allergies Added:   Visit Type:  Follow-up Primary [name]:  [name], MD  CC:  [name]. gets shortness of breath..  History of Present Illness: 55 yo with h/o HTN, DM, hyperlipidemia, and smoking who developed NSTEMI in 6/10 returns for followup.  At the time, he was found to have a totally occluded mid RCA (known from prior studies) and a tight distal  CFX stenosis.  A drug-eluting stent was placed in the distal CFX, which was though to be the infarct-related lesion.  EF was preserved on left ventriculogram.  Since I last saw him, patient was hospitalized with COPD exacerbation and was found to have RUL mass.  This was PET positive.  He had a biopsy done recently which was inconclusive and he is going back to see Dr. Marchelle Gearing soon to see what steps are next.   He has chronic exertional dyspnea: mopping floor, moving furniture.  He is actually able to climb up 2 flights of steps at work without trouble.  He has had no chest pain.  Spiriva has been helping his symptoms.   Labs (6/10):  Creatinine 1.48, K normal Labs (5/11): K 4, creatinine 1.6, LDL 53, HDL 30  Current Medications (verified): 1)  Simvastatin 80 Mg Tabs (Simvastatin) .... Take One Tablet By Mouth Daily At Bedtime 2)  Aspirin Ec 325 Mg Tbec (Aspirin) .... Take One Tablet By Mouth Daily 3)  Hydrochlorothiazide 12.5 Mg Caps (Hydrochlorothiazide) .... Take 1 Tablet By Mouth Once A Day 4)  Plavix 75 Mg Tabs (Clopidogrel Bisulfate) .... Take One Tablet By Mouth Daily 5)  Metoprolol Tartrate 25 Mg Tabs (Metoprolol Tartrate) .... Take One Tablet By Mouth Twice A Day 6)  Glipizide 5 Mg Tabs (Glipizide) .... Take 1 Tablet By Mouth Two Times A Day 7)  Nitrostat 0.4 Mg Subl (Nitroglycerin) .Marland Kitchen.. 1 Tablet Under Tongue At Onset of Chest Pain; You May Repeat Every 5 Minutes For Up To 3 Doses. 8)  Ventolin Hfa  108 (90 Base) Mcg/act Aers (Albuterol Sulfate) .Marland Kitchen.. 1-2 Puffs Four Times Daily As Needed 9)  Benicar 20 Mg Tabs (Olmesartan Medoxomil) .... Take 1 Tablet By Mouth Once A Day  Allergies (verified): 1)  ! Pcn  Past History:  Past Surgical History: Last updated: 11/14/2009 stents  Family History: Last updated: 11/03/2008 Family history of stroke.   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  Quit in May 2011.  1ppd x 38 yrs Alcohol Use - yes Regular Exercise - yes Drug Use - no  Risk Factors: Exercise: yes (01/07/2010)  Risk Factors: Smoking Status: quit < 6 months (01/07/2010) Packs/Day: 1.0 (01/07/2010)  Past Medical History: 1.  CAD:  PCI 3/08 to OM2 and mid CFX.  NSTEMI 6/10.  Prior stents patent.  90% distal CFX, totally occluded mRCA (old) with collaterals.  EF 55% on LV-gram.  Xience DES 2.5 x 23 mm to distal CFX.  2.  HTN 3.  DM2 4.  Hyperlipidemia 5.  COPD: quit tobacco 5/11.   > Gold Stage 3 on PFT July 2011 6.  CKD, last creatinine 1.6 7.  obesity 8.  History of CVA 9.  RUL mass: PET positive, biopsy 10/11 indeterminant.      Family History: Reviewed  history from 11/03/2008 and no changes required. Family history of stroke.   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  Quit in May 2011.  1ppd x 38 yrs Alcohol Use - yes Regular Exercise - yes Drug Use - no  Review of Systems       All systems reviewed and negative except as per HPI.   Vital Signs:  Patient profile:   55 year old male Height:      71 inches Weight:      251 pounds BMI:     35.13 Pulse rate:   94 / minute BP sitting:   118 / 75  (left arm) Cuff size:   large  Vitals Entered By: Bishop Dublin, CMA (March 04, 2010 4:14 PM)  Physical Exam  General:  Well  developed, well nourished, in no acute distress. Obese.  Neck:  Neck supple, no JVD. No masses, thyromegaly or abnormal cervical nodes. Lungs:  Distant breath sounds bilaterally.  Heart:  Non-displaced PMI, chest non-tender; regular rate and rhythm, S1, S2 without murmurs, rubs or gallops. Carotid upstroke normal, no bruit.  Pedals normal pulses. 1+ ankle edema.  Abdomen:  Bowel sounds positive; abdomen soft and non-tender without masses, organomegaly, or hernias noted. No hepatosplenomegaly. Extremities:  No clubbing or cyanosis. Neurologic:  Alert and oriented x 3. Psych:  Normal affect.   Impression & Recommendations:  Problem # 1:  CAD, NATIVE VESSEL (ICD-414.01) No ischemic symptoms.  He is doing well post-PCI.  I will have him continue his current medications, including ASA, Plavix, ACEI, beta blocker, and statin.  Given his multiple stents, I will have him stay on Plavix.  If he needs to have a lobectomy, it will be ok to hold Plavix prior to this (> 1 year since last stent).  He should continue his beta blocker post-operatively.  No further testing would be needed before procedure.   Problem # 2:  HYPERLIPIDEMIA-MIXED (ICD-272.4) Lipids at goal when last checked (LDL < 70).  He is on simvastatin 80 mg daily.  Given the higher risk of side effects with this medication, I will have him stop this and go on atorvastatin 40 mg daily with lipids/LFTs in 2 months.  Atorvastatin will be generic in November.    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   Patient Instructions: 1)  Your physician has recommended you make the following change in your medication: STOP simvastatin START lipitor 2)  Your physician wants you to follow-up in:   6 months You will receive a reminder letter in the mail two months in advance. If you don't receive a letter, please call our office to schedule the follow-up appointment. 3)  Your physician recommends that  you return for a FASTING lipid profile: 2 months  Prescriptions: LIPITOR 40 MG TABS (ATORVASTATIN CALCIUM) Take 1 tab by mouth at bedtime  #30 x 12   Entered by:   Benedict Needy, RN   Authorized by:   [name], MD   Signed by:   Benedict Needy, RN on 03/04/2010   Method used:   Electronically to        [facility] [address]* (retail)       [address]       [place], [place]  [address]       Ph: [phone]  Fax: [phone]   RxID:   [id number]

## 2010-06-13 NOTE — Progress Notes (Signed)
Summary: results  Phone Note Call from Patient Call back at Home Phone [phone]   [name]: Patient Call For: ramaswamy Reason for Call: Talk to Nurse Summary of Call: Requesting result of PET scan. Initial call taken by: Lehman Prom,  January 23, 2010 12:51 PM  Follow-up for Phone Call        Spoke with pt and advised that MR has been trying to reach him with these results and that MR is is the office this afternoon.  Pt states that he can be reached at home number today- [phone].  Will forward to MR. Follow-up by: Vernie Murders,  January 23, 2010 12:55 PM  Additional Follow-up for Phone Call Additional follow up Details #1::        d/w patient. He states he never got my messages at the number I called him which is above and he has affirmed as correct number!!  nformed of PET scan result. Explained he needs biopsy. CT guided bx versus ENB. HE prefers to be under anesthesia. Risks explained - bleeding, ptx and non-diagnosis but he wishes to proceed  Additional Follow-up by: Kalman Shan MD,  January 23, 2010 4:49 PM     Appended Document: results pls tell him to go ahead and stop plavix. continue aspirin. This will give [place] a headstart for timing bx  Appended Document: results pt advised.

## 2010-06-13 NOTE — Progress Notes (Signed)
Summary: results of onc immune  Phone Note Call from Patient   [name]: Patient Call For: ramaswamy Summary of Call: calling for lab results Initial call taken by: Rickard Patience,  February 25, 2010 3:51 PM  Follow-up for Phone Call        Pt is requesting results of onc immune test. I advised I have not seen these results yet.I advised pt it takes 2 weeks, pt has test on 02-20-10. Pt states he was told it would be back in a week. I advised i will keep an eye out for these results and we will call him as soon as we know something. Carron Curie CMA  February 25, 2010 4:02 PM

## 2010-06-13 NOTE — Progress Notes (Signed)
Summary: need super D images of existing CT chest or PET CT  Phone Note Outgoing Call   Summary of Call: pls call [facility] at [facility] [address] and see if they can covert CT chest or PET scan already done into SUPERDIMENSION IMAGE PROTOCL and get a CD out for [place].  Initial call taken by: Kalman Shan MD,  January 24, 2010 4:03 PM  Follow-up for Phone Call        Spoke to kristin at [facility] and CT will be put on Super-D and put on CD and sent to [place]. MR aware. Carron Curie CMA  January 24, 2010 5:39 PM

## 2010-06-13 NOTE — Progress Notes (Signed)
Summary: rx  Medications Added NITROSTAT 0.4 MG SUBL (NITROGLYCERIN) 1 tablet under tongue at onset of chest pain; you may repeat every 5 minutes for up to 3 doses.       Phone Note Refill Request Call back at Home Phone [phone] Message from:  Patient on July 11, 2009 4:02 PM  Refills Requested: Medication #1:  nitro kmart  Initial call taken by: Harlon Flor,  July 11, 2009 4:02 PM    New/Updated Medications: NITROSTAT 0.4 MG SUBL (NITROGLYCERIN) 1 tablet under tongue at onset of chest pain; you may repeat every 5 minutes for up to 3 doses. Prescriptions: NITROSTAT 0.4 MG SUBL (NITROGLYCERIN) 1 tablet under tongue at onset of chest pain; you may repeat every 5 minutes for up to 3 doses.  #30 x 6   Entered by:   Mercer Pod   Authorized by:   [name], MD   Signed by:   Mercer Pod on 07/11/2009   Method used:   Electronically to        [facility] [address]* (retail)       [address]       [place], [place]  [address]       Ph: [phone]       Fax: [phone]   RxID:   [id number]

## 2010-06-13 NOTE — Assessment & Plan Note (Signed)
Summary: ROV/AMD      Allergies Added:   Visit Type:  Follow-up Referring Provider:  Dr. [name] Primary Provider:  [name], MD  CC:  Patient has chest pain and discomfort occasionally. Patient had SOB few days ago and also get shortenss when moving items like furniture. No other complaints.  History of Present Illness: 55 yo with h/o HTN, DM, hyperlipidemia, and smoking who developed NSTEMI in 6/10 returns for followup.  At the time, he was found to have a totally occluded mid RCA (known from prior studies) and a tight distal  CFX stenosis.  A drug-eluting stent was placed in the distal CFX, which was though to be the infarct-related lesion.  EF was preserved on left ventriculogram.  Patient had a tough year. He has been out of work and his father died.  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  He gets occasional, fleeting pin-prick type chest pains, not associated with exertion.  He can walk on flat ground and climb a flight of steps without chest pain or dyspnea. He notes some dyspnea with heavier exertion such as moving heavy furniture.  He still does not have health insurance so cost remains a big issue for him.  He is smoking a rare cigarette (maybe once a week).   Labs (6/10):  Creatinine 1.48, K normal  Current Medications (verified): 1)  Simvastatin 80 Mg Tabs (Simvastatin) .... Take One Tablet By Mouth Daily At Bedtime 2)  Aspirin Ec 325 Mg Tbec (Aspirin) .... Take One Tablet By Mouth Daily 3)  Enalapril-Hydrochlorothiazide 5-12.5 Mg Tabs (Enalapril-Hydrochlorothiazide) .... Once Daily 4)  Plavix 75 Mg Tabs (Clopidogrel Bisulfate) .... Take One Tablet By Mouth Daily 5)  Metoprolol Tartrate 25 Mg Tabs (Metoprolol Tartrate) .... Take One Tablet By Mouth Twice A Day 6)  Glipizide 5 Mg Tabs (Glipizide) .... Once Daily 7)  Nitrostat 0.4 Mg Subl (Nitroglycerin) .Marland Kitchen.. 1 Tablet Under Tongue At Onset of Chest Pain; You May Repeat  Every 5 Minutes For Up To 3 Doses.  Allergies (verified): 1)  ! Pcn  Past History:  Past Medical History: Reviewed history from 11/03/2008 and no changes required. 1.  CAD:  PCI 3/08 to OM2 and mid CFX.  NSTEMI 6/10.  Prior stents patent.  90% distal CFX, totally occluded mRCA (old) with collaterals.  EF 55% on LV-gram.  Xience DES 2.5 x 23 mm to distal CFX.  2.  HTN 3.  DM2 4.  Hyperlipidemia 5.  Prior tobacco abuse, quit 6/10.  6.  CKD, last creatinine 1.48 (6/10) 7.  obesity 8.  h/o CVA    Family History: Reviewed history from 11/03/2008 and no changes required. Family history of stroke.   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  Single  Tobacco Use - Former, quit 6/10. However, occasionally sneaks a cigarette.  Alcohol Use - yes Regular Exercise - yes Drug Use - no  Review of Systems       All systems reivewed and negative except as per HPI.   Vital Signs:  Patient profile:   55 year old male Height:      71 inches Weight:      252.50 pounds BMI:     35.34 Pulse rate:   79 / minute BP sitting:   122 / 80  (left arm) Cuff size:   large  Physical Exam  General:  Well developed, well nourished, in no acute distress.  Obese.  Neck:  Neck supple, no JVD. No masses, thyromegaly or abnormal cervical nodes. Lungs:  Clear bilaterally to auscultation and percussion. Heart:  Non-displaced PMI, chest non-tender; regular rate and rhythm, S1, S2 without murmurs, rubs or gallops. Carotid upstroke normal, no bruit. Pedals normal pulses. Trace ankle edema.  Abdomen:  Bowel sounds positive; abdomen soft and non-tender without masses, organomegaly, or hernias noted. No hepatosplenomegaly. Extremities:  No clubbing or cyanosis. Neurologic:  Alert and oriented x 3. Psych:  Normal affect.   Impression & Recommendations:  Problem # 1:  CAD, NATIVE VESSEL (ICD-414.01) No ischemic symptoms.  He is doing well post-PCI.  I will have him continue his  current medications, including ASA, Plavix, ACEI, beta blocker, and statin.  He needs to get more exercise (walking, etc).   Problem # 2:  HYPERLIPIDEMIA-MIXED (ICD-272.4) Patient will return fasting for lipids/LFTs.  Will also check his hemoglobin A1c.   Problem # 3:  SMOKING Rare, but counselled to quit altogether.   Patient Instructions: 1)  Your physician recommends that you schedule a follow-up appointment in: 6 months 2)  Your physician recommends that you return for a FASTING lipid profile: at your convenience (lipids, cmet, A1C) 3)  Your physician recommends that you continue on your current medications as directed. Please refer to the Current Medication list given to you today. Prescriptions: NITROSTAT 0.4 MG SUBL (NITROGLYCERIN) 1 tablet under tongue at onset of chest pain; you may repeat every 5 minutes for up to 3 doses.  #25 x 3   Entered by:   Charlena Cross, RN, BSN   Authorized by:   [name], MD   Signed by:   Charlena Cross, RN, BSN on 09/07/2009   Method used:   Electronically to        [facility] [address]* (retail)       [address]       [place], [place]  [address]       Ph: [phone]       Fax: [phone]   RxID:   [id number] GLIPIZIDE 5 MG TABS (GLIPIZIDE) once daily  #30 x 6   Entered by:   Charlena Cross, RN, BSN   Authorized by:   [name], MD   Signed by:   Charlena Cross, RN, BSN on 09/07/2009   Method used:   Electronically to        [facility] [address]* (retail)       [address]       [place], [place]  [address]       Ph: [phone]       Fax: [phone]   RxID:   [id number] PLAVIX 75 MG TABS (CLOPIDOGREL BISULFATE) Take one tablet by mouth daily  #30 x 6   Entered by:   Charlena Cross, RN, BSN   Authorized by:   [name], MD   Signed by:   Charlena Cross, RN, BSN on 09/07/2009   Method used:   Electronically to        [facility] [address] (retail)        [address]       [place], [place]  [address]       Ph: [phone]       Fax: [phone]   RxID:   [id number] METOPROLOL TARTRATE 25 MG TABS (METOPROLOL TARTRATE) Take one tablet by mouth twice a day  #60 x 6   Entered by:   Charlena Cross, RN,  BSN   Authorized by:   [name], MD   Signed by:   Charlena Cross, RN, BSN on 09/07/2009   Method used:   Electronically to        [facility] [address] (retail)       [address]       [place], [place]  [address]       Ph: [phone]       Fax: [phone]   RxID:   [id number] ENALAPRIL-HYDROCHLOROTHIAZIDE 5-12.5 MG TABS (ENALAPRIL-HYDROCHLOROTHIAZIDE) once daily  #30 x 6   Entered by:   Charlena Cross, RN, BSN   Authorized by:   [name], MD   Signed by:   Charlena Cross, RN, BSN on 09/07/2009   Method used:   Electronically to        [facility] [address]* (retail)       [address]       [place], [place]  [address]       Ph: [phone]       Fax: [phone]   RxID:   [id number] SIMVASTATIN 80 MG TABS (SIMVASTATIN) Take one tablet by mouth daily at bedtime  #30 x 6   Entered by:   Charlena Cross, RN, BSN   Authorized by:   [name], MD   Signed by:   Charlena Cross, RN, BSN on 09/07/2009   Method used:   Electronically to        [facility] [address]* (retail)       [address]       [place], [place]  [address]       Ph: [phone]       Fax: [phone]   RxID:   [id number]

## 2010-06-13 NOTE — Progress Notes (Signed)
Summary: PET SCAN NODULE IS PET HOT-LMTCB x 1  Phone Note Outgoing Call   Call placed by: Kalman Shan MD,  January 17, 2010 5:35 PM Call placed to: Patient Summary of Call: called to give PET Scan results from today - > Nodule is PET HOT. LMTCB. HE might call tomorrow. If so, please page me or call me on my cell with his call back number. So, I can call and discuss next step Initial call taken by: Kalman Shan MD,  January 17, 2010 5:35 PM  Follow-up for Phone Call        called again and phone at his end went silent half way through. [name], pls try him tomorrow 9/13 Follow-up by: Kalman Shan MD,  January 21, 2010 5:39 PM    Additional Follow-up for Phone Call Additional follow up Details #2::    see 01/23/2010 note

## 2010-06-13 NOTE — Progress Notes (Signed)
Summary: onc immune results  Phone Note Call from Patient   [name]: pt Summary of Call: Pt calling requesting ONC Immune results. I have received the results and have faxed them to MR at [facility] so he can review them. Carron Curie CMA  March 01, 2010 4:43 PM  Initial call taken by: Carron Curie CMA,  March 01, 2010 4:43 PM  Follow-up for Phone Call        [facility] test is NEGATIVE. Test collected on 10/10. They got it 10/12 but read out report today10/21 only. Typically is a 1 week turn around that is why I told him "one week to ten days". But this time the lab took this long. Called to give him test result but mail box was full x 2. Negative test does not alter risk profile for lung cancer (a positive test would have). Please give appt with me to discuss implications of test results Follow-up by: Kalman Shan MD,  March 01, 2010 5:45 PM  Additional Follow-up for Phone Call Additional follow up Details #1::        Spoke with pt and advised of resutls. Pt request OV this week in the afternoon as late as possible. Pt adament to haev OV this week. Pt set to see MR on Thursday at 4:30. Carron Curie CMA  March 04, 2010 9:38 AM

## 2010-06-13 NOTE — Progress Notes (Signed)
Summary: Rx refill Plavix 75 mg   Phone Note Call from Patient   Reason for Call: Refill Medication Details for Reason: Plavix 75mg  Summary of Call: Needs a refill called in for Plavix 75 mg one tablet everyday to the patient assistance program.   Initial call taken by: Bishop Dublin, CMA,  November 19, 2009 11:51 AM  Follow-up for Phone Call        Rx Called In for Plavix 75 mg one tablet everyday with 90 tabs and 3 refills to the patient assistance program. Follow-up by: Bishop Dublin, CMA,  November 19, 2009 11:55 AM

## 2010-06-13 NOTE — Assessment & Plan Note (Signed)
Summary: f/u pft results///kp   Visit Type:  Follow-up Copy to:  Dr. [name] Primary [name]/Referring [name]:  Kevin Merry, MD  CC:  Pt here to review CT scan resutls. .  History of Present Illness: 55 year old male. Heave smoker with likely copd. Quit May 2011. Admitte end May 2011 - early June 2011 for RUL infiltrate. Initial concern was TB but BAL bronch showed negative AFB stain. Treated for Round Pneumonia CAP and discharged.   OV 01/07/2010: Now presents for followup 3 months since pna hospialization for pna followup, copd and tobacco relapse review. Has had fu CT and full PFTs. Feels better overall. Has not relapsed into smoking Improved  dyspnea. Les fatigue. Feels well overall. Denies active complaints. PFTs show Gold stage 3 COPD and CT chest 01/03/2010 shows mildly reduced but persistent RUL infiltrate/scar. Has new complaints of medical bill payments and he is struggling with it despite gettung a new job with health insurance; unable to afford spiriva and unwilling to try ics due to prior renal failure  Preventive Screening-Counseling & Management  Alcohol-Tobacco     Smoking Status: quit < 6 months     Smoking Cessation Counseling: no     Smoke Cessation Stage: quit     Packs/Day: 1.0     Year Started: 1963     Year Quit: May 2011     Pack years: 31     Tobacco Counseling: not to resume use of tobacco products  Caffeine-Diet-Exercise     Does Patient Exercise: yes  Current Medications (verified): 1)  Simvastatin 80 Mg Tabs (Simvastatin) .... Take One Tablet By Mouth Daily At Bedtime 2)  Aspirin Ec 325 Mg Tbec (Aspirin) .... Take One Tablet By Mouth Daily 3)  Hydrochlorothiazide 12.5 Mg Caps (Hydrochlorothiazide) .... Take 1 Tablet By Mouth Once A Day 4)  Plavix 75 Mg Tabs (Clopidogrel Bisulfate) .... Take One Tablet By Mouth Daily 5)  Metoprolol Tartrate 25 Mg Tabs (Metoprolol Tartrate) .... Take One Tablet By Mouth Twice A Day 6)  Glipizide 5 Mg Tabs  (Glipizide) .... Take 1 Tablet By Mouth Two Times A Day 7)  Nitrostat 0.4 Mg Subl (Nitroglycerin) .Marland Kitchen.. 1 Tablet Under Tongue At Onset of Chest Pain; You May Repeat Every 5 Minutes For Up To 3 Doses. 8)  Ventolin Hfa 108 (90 Base) Mcg/act Aers (Albuterol Sulfate) .Marland Kitchen.. 1-2 Puffs Four Times Daily As Needed 9)  Benicar 20 Mg Tabs (Olmesartan Medoxomil) .... Take 1 Tablet By Mouth Once A Day  Allergies (verified): 1)  ! Pcn  Past History:  Past medical, surgical, family and social histories (including risk factors) reviewed, and no changes noted (except as noted below).  Past Medical History: #  CAD:  PCI 3/08 to OM2 and mid CFX.  NSTEMI 6/10.  Prior stents patent.  90% distal CFX, totally occluded mRCA (old) with collaterals.  EF 55% on LV-gram.  Xience DES 2.5 x 23 mm to distal CFX.  #  HTN #  DM2 #.  Hyperlipidemia #  Prior tobacco abuse, quit 5/11 #COPD  > Gold Stage 3 on PFT July 2011 #  CKD, last creatinine 1.48 (6/10) #.  obesity #  h/o CVA    Past Surgical History: Reviewed history from 11/14/2009 and no changes required. stents  Past Pulmonary History:  Pulmonary History:  PRIMARY DISCHARGE DIAGNOSIS: 10/06/2009  - 10/11/2009  1. Atypical pneumonia.   2. Bronchospasm questionable underlying asthma with chronic       obstructive pulmonary disease.  SECONDARY DISCHARGE DIAGNOSES:   1. History of cerebrovascular accident in 2003.   2. History of coronary artery disease status post percutaneous       transluminal coronary angioplasty in  March 2008.   3. Diabetes.   4. Hypertension.   5. Chronic kidney disease stage two.   6. Tobacco abuse.   7. Asthma as a child.  Family History: Reviewed history from 11/03/2008 and no changes required. Family history of stroke.   Social History: Reviewed history from 11/14/2009 and no changes required. 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   Quit in May 2011.  1ppd x 38 yrs Alcohol Use - yes Regular Exercise - yes Drug Use - no  Review of Systems  The patient denies shortness of breath with activity, shortness of breath at rest, productive cough, non-productive cough, coughing up blood, chest pain, irregular heartbeats, acid heartburn, indigestion, loss of appetite, weight change, abdominal pain, difficulty swallowing, sore throat, tooth/dental problems, headaches, nasal congestion/difficulty breathing through nose, sneezing, itching, ear ache, anxiety, depression, hand/feet swelling, joint stiffness or pain, rash, change in color of mucus, and fever.    Vital Signs:  Patient profile:   55 year old male Height:      71 inches Weight:      249.38 pounds BMI:     34.91 O2 Sat:      97 % on Room air Temp:     98.9 degrees F oral Pulse rate:   87 / minute BP sitting:   120 / 72  (left arm) Cuff size:   regular  Vitals Entered By: Carron Curie CMA (January 07, 2010 4:15 PM)  O2 Flow:  Room air CC: Pt here to review CT scan resutls.  Comments Medications reviewed with patient Carron Curie CMA  January 07, 2010 4:16 PM Daytime phone number verified with patient.    Physical Exam  General:  normal appearance, healthy appearing, and obese.   Head:  normocephalic and atraumatic Eyes:  PERRLA/EOM intact; conjunctiva and sclera clear Ears:  TMs intact and clear with normal canals Nose:  no deformity, discharge, inflammation, or lesions Mouth:  no deformity or lesions Neck:  no masses, thyromegaly, or abnormal cervical nodes Chest Wall:  no deformities noted Lungs:  clear bilaterally to auscultation and percussion Heart:  regular rate and rhythm, S1, S2 without murmurs, rubs, gallops, or clicks Abdomen:  bowel sounds positive; abdomen soft and non-tender without masses, or organomegaly Msk:  no deformity or scoliosis noted with normal posture Pulses:  pulses normal Extremities:  no clubbing, cyanosis, edema, or  deformity noted Neurologic:  CN II-XII grossly intact with normal reflexes, coordination, muscle strength and tone Skin:  intact without lesions or rashes Cervical Nodes:  no significant adenopathy Axillary Nodes:  no significant adenopathy Psych:  alert and cooperative; normal mood and affect; normal attention span and concentration   CT of Chest  Procedure date:  02/03/2010  Findings:      RUL density persistent. NOt fully like a scar. Concerning for a mass within it. Discussed at [facility] 01/10/2010  MISC. Report  Procedure date:  12/06/2009  Findings:      PFTS - GOld stage 3 COPD. FEv1 1.53L/44%. DLCO 54%  CT of Chest  Procedure date:  01/03/2010  Findings:      tht ct chest is 01/03/2010 not 02/03/2010  Impression & Recommendations:  Problem # 1:  CHRONIC OBSTRUCTIVE PULMONARY DISEASE, SEVERE (ICD-496) Assessment Unchanged stable disease  plan ou have stage 3 or severe copd - nurse will give you pft result  - start spiriva 1 puff daily - take samples, show technique (has $ issues, will give him spiriva samples to tide over) - we wil send out blood for alpha 1 antitrypsin to [facility] - flu shot asap - will figure out pneumovax status next visit  Problem # 2:  TOBACCO USE, QUIT (ICD-V15.82) Assessment: Unchanged continue remission Orders: T- * Misc. Laboratory test [id number]) Radiology Referral (Radiology) Est. 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 MTOC discussion 01/10/2010 everyone is uniformly concerned abut underlying malignancy. Will Get PET scan, IF PET negative then serial followup. IF PET positive, then it could be inflammatory or malignancy. Might need CT guided bx for PET positive lesion but again there is risk for false negative results   I called him 01/10/2010 to discuss but went to  voicemai. LMTCB Orders: Radiology Referral (Radiology) Est. Patient Level IV (13244)  Problem # 4:  FATIGUE (ICD-780.79) Assessment: Improved resolved. Orders: Est. Patient Level IV (01027)  Medications Added to Medication List This Visit: 1)  Glipizide 5 Mg Tabs (Glipizide) .... Take 1 tablet by mouth two times a day 2)  Spiriva Handihaler 18 Mcg Caps (Tiotropium bromide monohydrate) .... One puffs in handihaler daily  Other Orders: Prescription Created Electronically [id number]) HFA Instruction [phone])  Patient Instructions: 1)  #COPD 2)  - you have stage 3 or severe copd - nurse will give you pft result 3)   - start spiriva 1 puff daily - take samples, show technique 4)  - we wil send out blood for alpha 1 antitrypsin to [facility] 5)  #LUNG INFILTRATE RT LUNG  6)   - you likely need PET scan 7)   - I will let you know end of this week after discussing at team conf Prescriptions: SPIRIVA HANDIHALER 18 MCG  CAPS (TIOTROPIUM BROMIDE MONOHYDRATE) one puffs in handihaler daily  #1 x 6   Entered and Authorized by:   Kalman Shan MD   Signed by:   Kalman Shan MD on 01/07/2010   Method used:   Electronically to        [facility] [address] (retail)       [address]       [place], [place]  [address]       Ph: [phone]       Fax: [phone]   RxID:   [id number]

## 2010-06-13 NOTE — Letter (Signed)
Summary: [facility]  [facility]   Imported By: Lester [place] 04/23/2010 08:44:01  _____________________________________________________________________  External Attachment:    Type:   Image     Comment:   External Document

## 2010-06-13 NOTE — Assessment & Plan Note (Signed)
Summary: discuss results//jrc   Visit Type:  Follow-up Copy to:  Dr. [name] Primary [name]/Referring Miracle Mongillo:  [name], MD  CC:  Discuss test results and plan. [name]  History of Present Illness: RUL PET HOT intermediate prob nodule - ENB nondiagnostic 02/06/2010.  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 [name] EX-smoker - quit may 2011. Fnctional status  - Vo2 max 4ml/kg/min on CPST 02/01/2010   March 07, 2010. Follow up after biipsy and tests. ENB 02/06/2010 was indeterminate. Oncimmune panel 03/01/2010 ws negagtive. He feels well and at baseline. He is very frustrated about the nodule. He understands the non-diagnostic results of the biopsy and uncertainty. States family friend was monitored for years for nodule and it ended up with stage 4 cancer and a close relative was operated upon for nodule and was negative for malignancy. Therefore, he is torn if he wants this monitored or removed. He is aware of postoperative risk morbidity and mortality and potential need for O2 and loss of lung function if he has lobectomey. No new complaints.   Preventive Screening-Counseling & Management  Alcohol-Tobacco     Smoking Status: quit < 6 months     Smoking Cessation Counseling: no     Smoke Cessation Stage: quit     Packs/Day: 1.0     Year Started: 1963     Year Quit: May 2011     Pack years: 73     Tobacco Counseling: not to resume use of tobacco products  Caffeine-Diet-Exercise     Does Patient Exercise: yes  Current Medications (verified): 1)  Lipitor 40 Mg Tabs (Atorvastatin Calcium) .... Take 1 Tab By Mouth At Bedtime 2)  Aspirin Ec 325 Mg Tbec (Aspirin) .... Take One Tablet By Mouth Daily 3)  Hydrochlorothiazide 12.5 Mg Caps (Hydrochlorothiazide) .... Take 1 Tablet By Mouth Once A Day 4)  Plavix 75 Mg Tabs (Clopidogrel Bisulfate) .... Take One Tablet By Mouth Daily 5)  Metoprolol Tartrate 25 Mg Tabs (Metoprolol Tartrate) .... Take One Tablet By Mouth  Twice A Day 6)  Glipizide 5 Mg Tabs (Glipizide) .... Take 1 Tablet By Mouth Two Times A Day 7)  Nitrostat 0.4 Mg Subl (Nitroglycerin) .[name].. 1 Tablet Under Tongue At Onset of Chest Pain; You May Repeat Every 5 Minutes For Up To 3 Doses. 8)  Ventolin Hfa 108 (90 Base) Mcg/act Aers (Albuterol Sulfate) .[name].. 1-2 Puffs Four Times Daily As Needed 9)  Benicar 20 Mg Tabs (Olmesartan Medoxomil) .... Take 1 Tablet By Mouth Once A Day  Allergies (verified): 1)  ! Pcn  Past History:  Past medical, surgical, family and social histories (including risk factors) reviewed, and no changes noted (except as noted below).  Past Medical History: #.  CAD:  PCI 3/08 to OM2 and mid CFX.  NSTEMI 6/10.  Prior stents patent.  90% distal CFX, totally occluded mRCA (old) with collaterals.  EF 55% on LV-gram.  Xience DES 2.5 x 23 mm to distal CFX.  #.  DM2 #.  Hyperlipidemia #.  COPD:   - quit tobacco 5/11.    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 Marchelle Gearing - Aug 2011 #  CKD, last creatinine 1.6 #  obesity #.  History of CVA wihtout residual deficits #.  RUL mas   -  Rx for CAP end May 2011  - Persistent and PET positive - Aug 2011  - ENB Bx 02/06/2010 - indeterminate  - Onciummne lung cancer antigen panel - Negative  03/01/2010 (test of poor sensitivity)      Past Surgical History: Reviewed history from 11/14/2009 and no changes required. stents  Past Pulmonary History:  Pulmonary History:  PRIMARY DISCHARGE DIAGNOSIS: 10/06/2009  - 10/11/2009  1. Atypical pneumonia.   2. Bronchospasm questionable underlying asthma with chronic       obstructive pulmonary disease.      SECONDARY DISCHARGE DIAGNOSES:   1. History of cerebrovascular accident in 2003.   2. History of coronary artery disease status post percutaneous        transluminal coronary angioplasty in  March 2008.   3. Diabetes.   4. Hypertension.   5. Chronic kidney disease stage two.   6. Tobacco abuse.   7. Asthma as a child.  Family History: Reviewed history from 11/03/2008 and no changes required. Family history of stroke.   Social History: Reviewed history from 03/04/2010 and no changes required. 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  Quit in May 2011.  1ppd x 38 yrs Alcohol Use - yes Regular Exercise - yes Drug Use - no  Review of Systems  The patient denies anorexia, fever, weight loss, weight gain, vision loss, decreased hearing, hoarseness, chest pain, syncope, dyspnea on exertion, peripheral edema, prolonged cough, headaches, hemoptysis, abdominal pain, melena, hematochezia, severe indigestion/heartburn, hematuria, incontinence, genital sores, muscle weakness, suspicious skin lesions, transient blindness, difficulty walking, depression, unusual weight change, abnormal bleeding, enlarged lymph nodes, angioedema, breast masses, and testicular masses.    Vital Signs:  Patient profile:   55 year old male Height:      71 inches Weight:      252.13 pounds BMI:     35.29 O2 Sat:      95 % on Room air Temp:     98.3 degrees F oral Pulse rate:   86 / minute BP sitting:   108 / 72  (right arm) Cuff size:   regular  Vitals Entered By: Carron Curie CMA (March 07, 2010 4:50 PM)  O2 Flow:  Room air CC: Discuss test results and plan.  Comments Medications reviewed with patient Carron Curie CMA  March 07, 2010 4:50 PM Daytime phone number verified with patient.    Physical Exam  General:  normal appearance, healthy appearing, and obese.   Head:  normocephalic and atraumatic Eyes:  PERRLA/EOM intact; conjunctiva and sclera clear Ears:  TMs intact and clear with normal canals Nose:  no deformity, discharge, inflammation, or  lesions Mouth:  no deformity or lesions Neck:  no masses, thyromegaly, or abnormal cervical nodes Chest Wall:  no deformities noted Lungs:  clear bilaterally to auscultation and percussion Heart:  regular rate and rhythm, S1, S2 without murmurs, rubs, gallops, or clicks Abdomen:  bowel sounds positive; abdomen soft and non-tender without masses, or organomegaly Msk:  no deformity or scoliosis noted with normal posture Pulses:  pulses normal Extremities:  no clubbing, cyanosis, edema, or deformity noted Neurologic:  CN II-XII grossly intact with normal reflexes, coordination, muscle strength and tone Skin:  intact without lesions or rashes Cervical Nodes:  no significant adenopathy Axillary Nodes:  no significant adenopathy Psych:  alert and cooperative; normal mood and affect; normal attention span and concentration   MISC.  Report  Procedure date:  03/07/2010  Findings:      results reviewed: alpha 1 - MM 01/07/2010 CPST 02/01/2010- VO2 max 66ml/kg/min ENB bx 02/06/2010 - NONDIAGNOSTIC 03/01/2010 - Onciumme panel - negative  Impression & Recommendations:  Problem # 1:  PULMONARY NODULE, RIGHT UPPER LOBE (ICD-518.89) Assessment Unchanged Though he has borderline PFTs, his CPST suggests he is an accetable risk candidate for lobecctomy although expect him to be o2 dependent. He is nervous about monitoring lesion. He is not too keen on CT guided biospsy for fear of another non-diagnostic result. He wants to know if he can have wedge biopsy on table and if positive he wil undergo lobectomy. He would prefer this route but wants to talk to surgeon. He hs met Dr. Edwyna Shell and wishes to see the same.  Orders: Surgical Referral (Surgery) Est. Patient Level III (16109)  Problem # 2:  CHRONIC OBSTRUCTIVE PULMONARY DISEASE, SEVERE (ICD-496) Assessment: Unchanged currently not on any inhalers. He stated in august that spiriva was too expensive and he is afraid of inhaled steroids. I will have my  nurse cal him and see if he is interested in Atrovent nebs that could be substantially cheaper  Patient Instructions: 1)  please see Dr. Edwyna Shell  2)  I wil email Dr. Edwyna Shell 3)  call me after seeing Dr. Edwyna Shell 4)  followup based on that    Appended Document: discuss results//jrc jen  a) when is his appt with Dr. Edwyna Shell b) he had stated in past that spiriva too expensive. Please check with him if he wants to do atrovent HFA that could be much cheaper or if he is willing to rethink spiriva  Thanks  MR  Appended Document: discuss results//jrc Pt states he saw Dr. Edwyna Shell yesterday. He is interested in starting atrovent HFA. Please advise on directions and I will send in rx to [facility]  Thanks.   Appended Document: discuss results//jrc atrovent HFA is 2 puff 4 times daily  Appended Document: discuss results//jrc Medications Added ATROVENT HFA 17 MCG/ACT AERS (IPRATROPIUM BROMIDE HFA) 2 puffs four times daily          Clinical Lists Changes  Medications: Added new medication of ATROVENT HFA 17 MCG/ACT AERS (IPRATROPIUM BROMIDE HFA) 2 puffs four times daily - Signed Rx of ATROVENT HFA 17 MCG/ACT AERS (IPRATROPIUM BROMIDE HFA) 2 puffs four times daily;  #1 x 3;  Signed;  Entered by: Carron Curie CMA;  Authorized by: Kalman Shan MD;  Method used: Electronically to [facility] [address], [place], [place]  [address], Ph: [phone], Fax: [phone]    Prescriptions: ATROVENT HFA 17 MCG/ACT AERS (IPRATROPIUM BROMIDE HFA) 2 puffs four times daily  #1 x 3   Entered by:   Carron Curie CMA   Authorized by:   Kalman Shan MD   Signed by:   Carron Curie CMA on 03/22/2010   Method used:   Electronically to        [facility] [address] (retail)       [address]       [place], [place]  [address]       Ph: [phone]       Fax: [phone]   RxID:   [id number]

## 2010-06-13 NOTE — Assessment & Plan Note (Signed)
Summary: hfu/ mbw   Visit Type:  Hospital Follow-up Copy to:  Dr. [name] Primary Provider/Referring Provider:  [name], MD  CC:  Pt here for post hospital follow up.  Pt states he is having good days and bad days.  States ventolin was helping "tremendously" but has ran out. Marland Kitchen  History of Present Illness: 55 year old male. Heave smoker. Quit May 2011. Admitte endMay 2011 - early June 2011 for RUL infiltrate. Initial concern was TB but BAL bronch showed negative AFB stain. Treated for Round Pneumonia CAP and discharged. Now presents for followup. Feels better overall. Still dyspneic. In fact worse than pre-hsoptialization. Gets dyspneic moving furniutre inside house. Also, c/o fatigue and being sleepy all the time.. This was there at baseline but worse now. No other complaints.   Preventive Screening-Counseling & Management  Alcohol-Tobacco     Smoking Status: quit < 6 months     Smoking Cessation Counseling: no     Smoke Cessation Stage: quit     Packs/Day: 1.0     Year Started: 1963     Year Quit: May 2011     Pack years: 12     Tobacco Counseling: not to resume use of tobacco products  Current Medications (verified): 1)  Simvastatin 80 Mg Tabs (Simvastatin) .... Take One Tablet By Mouth Daily At Bedtime 2)  Aspirin Ec 325 Mg Tbec (Aspirin) .... Take One Tablet By Mouth Daily 3)  Hydrochlorothiazide 12.5 Mg Caps (Hydrochlorothiazide) .... Take 1 Tablet By Mouth Once A Day 4)  Plavix 75 Mg Tabs (Clopidogrel Bisulfate) .... Take One Tablet By Mouth Daily 5)  Metoprolol Tartrate 25 Mg Tabs (Metoprolol Tartrate) .... Take One Tablet By Mouth Twice A Day 6)  Glipizide 5 Mg Tabs (Glipizide) .... Once Daily 7)  Nitrostat 0.4 Mg Subl (Nitroglycerin) .Marland Kitchen.. 1 Tablet Under Tongue At Onset of Chest Pain; You May Repeat Every 5 Minutes For Up To 3 Doses. 8)  Ventolin Hfa 108 (90 Base) Mcg/act Aers (Albuterol Sulfate) .Marland Kitchen.. 1-2 Puffs Four Times Daily As Needed 9)  Benicar 20 Mg Tabs  (Olmesartan Medoxomil) .... Take 1 Tablet By Mouth Once A Day  Allergies (verified): 1)  ! Pcn  Past History:  Past Medical History: Last updated: 11/03/2008 1.  CAD:  PCI 3/08 to OM2 and mid CFX.  NSTEMI 6/10.  Prior stents patent.  90% distal CFX, totally occluded mRCA (old) with collaterals.  EF 55% on LV-gram.  Xience DES 2.5 x 23 mm to distal CFX.  2.  HTN 3.  DM2 4.  Hyperlipidemia 5.  Prior tobacco abuse, quit 6/10.  6.  CKD, last creatinine 1.48 (6/10) 7.  obesity 8.  h/o CVA    Family History: Last updated: 11/03/2008 Family history of stroke.   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  Quit in May 2011.  1ppd x 38 yrs Alcohol Use - yes Regular Exercise - yes Drug Use - no  Risk Factors: Exercise: yes (11/02/2008)  Risk Factors: Smoking Status: quit < 6 months (11/14/2009) Packs/Day: 1.0 (11/14/2009)  Past Surgical History: stents  Past Pulmonary History:  Pulmonary History:  PRIMARY DISCHARGE DIAGNOSIS: 10/06/2009  - 10/11/2009  1. Atypical pneumonia.   2. Bronchospasm questionable underlying asthma with chronic       obstructive pulmonary disease.      SECONDARY DISCHARGE DIAGNOSES:   1. History of cerebrovascular accident in 2003.  2. History of coronary artery disease status post percutaneous       transluminal coronary angioplasty in  March 2008.   3. Diabetes.   4. Hypertension.   5. Chronic kidney disease stage two.   6. Tobacco abuse.   7. Asthma as a child.  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  Quit in May 2011.  1ppd x 38 yrs Alcohol Use - yes Regular Exercise - yes Drug Use - no Smoking Status:  quit < 6 months Packs/Day:  1.0 Pack years:  38  Review of Systems       The patient complains of shortness of breath with activity, productive cough, and non-productive cough.  The  patient denies shortness of breath at rest, coughing up blood, chest pain, irregular heartbeats, acid heartburn, indigestion, loss of appetite, weight change, abdominal pain, difficulty swallowing, sore throat, tooth/dental problems, headaches, nasal congestion/difficulty breathing through nose, sneezing, itching, ear ache, anxiety, depression, hand/feet swelling, joint stiffness or pain, rash, change in color of mucus, and fever.    Vital Signs:  Patient profile:   55 year old male Height:      71 inches Weight:      247 pounds BMI:     34.57 O2 Sat:      96 % on Room air Temp:     97.3 degrees F oral Pulse rate:   91 / minute BP sitting:   132 / 82  (right arm) Cuff size:   regular  Vitals Entered By: Gweneth Dimitri RN (November 14, 2009 1:54 PM)  O2 Flow:  Room air  Serial Vital Signs/Assessments:  Comments: Ambulatory Pulse Oximetry  Resting; HR__73___    02 Sat_96%RA____  Lap1 (185 feet)   HR__82___   02 Sat_94%RA____ Lap2 (185 feet)   HR__96___   02 Sat__92%RA___    Lap3 (185 feet)   HR__102___   02 Sat__93%RA___  _X__Test Completed without Difficulty ___Test Stopped due to: Carver Fila  November 14, 2009 2:29 PM  By: Carver Fila   CC: Pt here for post hospital follow up.  Pt states he is having good days and bad days.  States ventolin was helping "tremendously" but has ran out.  Comments Medications reviewed with patient Daytime contact number verified with patient. Gweneth Dimitri RN  November 14, 2009 1:57 PM    Physical Exam  General:  normal appearance, healthy appearing, and obese.   Head:  normocephalic and atraumatic Eyes:  PERRLA/EOM intact; conjunctiva and sclera clear Ears:  TMs intact and clear with normal canals Nose:  no deformity, discharge, inflammation, or lesions Mouth:  no deformity or lesions Neck:  no masses, thyromegaly, or abnormal cervical nodes Chest Wall:  no deformities noted Lungs:  clear bilaterally to auscultation and percussion Heart:   regular rate and rhythm, S1, S2 without murmurs, rubs, gallops, or clicks Abdomen:  bowel sounds positive; abdomen soft and non-tender without masses, or organomegaly Msk:  no deformity or scoliosis noted with normal posture Pulses:  pulses normal Extremities:  no clubbing, cyanosis, edema, or deformity noted Neurologic:  CN II-XII grossly intact with normal reflexes, coordination, muscle strength and tone Skin:  intact without lesions or rashes Cervical Nodes:  no significant adenopathy Axillary Nodes:  no significant adenopathy Psych:  alert and cooperative; normal mood and affect; normal attention span and concentration   CXR  Procedure date:  10/10/2009  Findings:  Comparison: Chest x-ray 10/09/2009    Findings: No pneumothorax post procedure.  The right upper lobe   density may be  improved.  This could be an artifact of C-arm   imaging.    IMPRESSION:   No acute findings post bronchial lavage.  Right upper lobe density   possibly improving.    Read By:  Bernerd Limbo,  M.D.   Released By:  Bernerd Limbo,  M.  Comments:      independently reviewed  Impression & Recommendations:  Problem # 1:  TOBACCO USE, QUIT (ICD-V15.82) Assessment Improved advised to stay quit. He is not having withdrawals. This is a good sign.  Orders: Pulmonary Referral (Pulmonary) Est. Patient Level IV (16109)  Problem # 2:  BRONCHOPNEUMONIA ORGANISM UNSPECIFIED (ICD-485) Assessment: Improved Clinically resolved. Will do fu CXR in 1-2 weeks which will be 6 weeks post admission.  Orders: T-2 View CXR (71020TC) Pulmonary Referral (Pulmonary) Est. Patient Level IV (60454)  Problem # 3:  DYSPNEA (ICD-786.05) Assessment: Deteriorated This ws present at [facility]ine at Class 2. Now class 2-3. ? due to dconditionin or COPD. will get full PFt. In interim, will give sample albuterol for use Orders: T-2 View CXR (71020TC) Pulmonary Referral (Pulmonary) Est. Patient Level IV  (09811)  Problem # 4:  FATIGUE (ICD-780.79) Assessment: New  He had this at baseline. This is worse after pneumonia admit. He is obese. THis could just be deconditioning or it could be deconditioning superimposed on underling sleep apnea. I will give him an epworth questionnaire and reviw this at followup  Orders: Est. Patient Level IV (91478)  Medications Added to Medication List This Visit: 1)  Hydrochlorothiazide 12.5 Mg Caps (Hydrochlorothiazide) .... Take 1 tablet by mouth once a day 2)  Ventolin Hfa 108 (90 Base) Mcg/act Aers (Albuterol sulfate) .Marland Kitchen.. 1-2 puffs four times daily as needed 3)  Benicar 20 Mg Tabs (Olmesartan medoxomil) .... Take 1 tablet by mouth once a day  Patient Instructions: 1)  My nurse will do a walking test on you 2)  Have full breathing test called PFT 3)  have CXR in 1-2 weeks 4)  REturn to see me after above 5)  You will get a sample albuterol inhaler - use 1-2 puff as needed but do not use it more than 4 times a day 6)  My nurse will give you a sleep apnea questionaire now  7)  I wil review the results of the questionnaire at next visit   Immunization History:  Influenza Immunization History:    Influenza:  historical (05/12/2009)  Pneumovax Immunization History:    Pneumovax:  historical (06/12/2009)   Appended Document: hfu/ mbw reviewed pft 12/06/2009 - Shows gold stage 3 COPD. Pls check with him his inhalers and get back to me  also cxr done 12/06/2009 still shows RUL density. So, please have him do non contrast CT chest end of this month on day of or day before he sees me. Pls set this up  Appended Document: Orders Update     Clinical Lists Changes  Orders: Added new Referral order of Radiology Referral (Radiology) - Signed      Appended Document: hfu/ mbw Called and spoke with pt and pt stated he verbally understood the results. Sent pcc referral for the ct chest w/o contrast. Pt states he is only taking the albuteral inhaler  that was given to him as an RX.

## 2010-06-13 NOTE — Progress Notes (Signed)
Summary: requesting results -needs oncimmune  Phone Note Call from Patient   [name]: Patient Call For: byrum Summary of Call: calling for biopsy results Initial call taken by: Rickard Patience,  February 11, 2010 4:03 PM  Follow-up for Phone Call        Per phone note from 9.30.11, Dr. Delton Coombes discuss prelim bx results with pt but pt will be waiting to hear from MR either that day or the follow wk re: results and plan.  MR, pt calling about bx results.  Pls advise.  thanks! Follow-up by: Gweneth Dimitri RN,  February 11, 2010 4:10 PM  Additional Follow-up for Phone Call Additional follow up Details #1::        for unclear reasons as of this minute it is not back yet. IF not back tomorrow, I will call path myself. Unsure why it is not back Additional Follow-up by: Kalman Shan MD,  February 11, 2010 4:18 PM    Additional Follow-up for Phone Call Additional follow up Details #2::    Called, spoke with pt. He was informed of above per MR and verbalized understanding.  Will forward message back to MR as a reminder.  Gweneth Dimitri RN  February 11, 2010 4:44 PM  Pt wants bx results.Darletta Moll  February 12, 2010 3:55 PM    Additional Follow-up for Phone Call Additional follow up Details #3:: Details for Additional Follow-up Action Taken: updated results - NON-DIAGNOSTIC. so, recommended oncimmune lung cancer antigen panel. JEn, pls call him and set it up asap. I will see him a week or 10 days after that so I can discuss face to face Additional Follow-up by: Kalman Shan MD,  February 12, 2010 5:26 PM  Pt advised to come by and have test anytime. I advised him to ask for me at front desk. I will schedule appt at that time Carron Curie [facility]  February 14, 2010 5:00 PM'

## 2010-06-13 NOTE — Miscellaneous (Signed)
Summary: Orders Update pft charges  Clinical Lists Changes  Orders: Added new Service order of Carbon Monoxide diffusing w/capacity (94720) - Signed Added new Service order of Lung Volumes (94240) - Signed Added new Service order of Spirometry (Pre & Post) (94060) - Signed 

## 2010-06-18 ENCOUNTER — Telehealth: Payer: Self-pay | Admitting: Cardiology

## 2010-06-19 ENCOUNTER — Telehealth: Payer: Self-pay | Admitting: Cardiology

## 2010-06-24 ENCOUNTER — Telehealth: Payer: Self-pay | Admitting: Internal Medicine

## 2010-06-24 NOTE — Assessment & Plan Note (Signed)
OFFICE VISIT  DEVLIN, BRINK R DOB:  Jun 25, 1955                                        June 12, 2010 CHART #:  [id number]  The patient came today.  He is still having some moderate pain but this is improving.  Overall, he is doing much better than I thought.  His blood pressure was 100/72, pulse 95, respirations 20, temperature 97.  I told him we would refill his hydrocodone for about another 6 weeks.  I will see him back again in 2 months with a chest x-ray.  Ines Bloomer, M.D. Electronically Signed  DPB/MEDQ  D:  06/12/2010  T:  06/13/2010  Job:  [id number]

## 2010-06-27 NOTE — Progress Notes (Signed)
Summary: RX   Phone Note Refill Request Call back at Home Phone [phone] Message from:  Patient on June 19, 2010 1:18 PM  Refills Requested: Medication #1:  enalipril [facility]  Initial call taken by: Harlon Flor,  June 19, 2010 1:19 PM  Follow-up for Phone Call        The pharmacist at [facility] states patient has been taking the enalapril 5/12.5mg  once daily.  Looks like enalapril changed to HCTZ 12.5mg  once daily back in July 2011.   Do you want this patient on enalapril or just the HCTZ  Follow-up by: Bishop Dublin, CMA,  June 19, 2010 5:17 PM     Appended Document: RX Appears that he should be on Benicar 20 once daily and HCTZ 12.5 qday.   Appended Document: RX Notified pt that this is what he should be taking, pt needs rx. Rx sent in.   Clinical Lists Changes  Medications: Changed medication from BENICAR 20 MG TABS (OLMESARTAN MEDOXOMIL) Take 1 tablet by mouth once a day to BENICAR 20 MG TABS (OLMESARTAN MEDOXOMIL) Take 1 tablet by mouth once a day - Signed Changed medication from HYDROCHLOROTHIAZIDE 12.5 MG CAPS (HYDROCHLOROTHIAZIDE) Take 1 tablet by mouth once a day to HYDROCHLOROTHIAZIDE 12.5 MG CAPS (HYDROCHLOROTHIAZIDE) Take 1 tablet by mouth once a day - Signed Rx of BENICAR 20 MG TABS (OLMESARTAN MEDOXOMIL) Take 1 tablet by mouth once a day;  #30 x 6;  Signed;  Entered by: Lanny Hurst RN;  Authorized by: [name], MD;  Method used: Electronically to [facility] [address], [place], [place]  [address], Ph: [phone], Fax: [phone] Rx of HYDROCHLOROTHIAZIDE 12.5 MG CAPS (HYDROCHLOROTHIAZIDE) Take 1 tablet by mouth once a day;  #30 x 6;  Signed;  Entered by: Lanny Hurst RN;  Authorized by: [name], MD;  Method used: Electronically to [facility] [address] [place], [place]  [address], Ph: [phone], Fax: [phone]    Prescriptions: HYDROCHLOROTHIAZIDE 12.5 MG CAPS (HYDROCHLOROTHIAZIDE) Take 1  tablet by mouth once a day  #30 x 6   Entered by:   Lanny Hurst RN   Authorized by:   [name], MD   Signed by:   Lanny Hurst RN on 06/20/2010   Method used:   Electronically to        [facility] [address] (retail)       [address]       [place], [place]  [address]       Ph: [phone]       Fax: [phone]   RxID:   [id number] BENICAR 20 MG TABS (OLMESARTAN MEDOXOMIL) Take 1 tablet by mouth once a day  #30 x 6   Entered by:   Lanny Hurst RN   Authorized by:   [name], MD   Signed by:   Lanny Hurst RN on 06/20/2010   Method used:   Electronically to        [facility] [address] (retail)       [address]       [place], [place]  [address]       Ph: [phone]       Fax: [phone]   RxID:   [id number]

## 2010-06-27 NOTE — Progress Notes (Signed)
Summary: RX   Phone Note Refill Request Call back at Home Phone [phone] Message from:  Patient on June 18, 2010 2:50 PM  There is an Advertising account executive at [facility] that is waiting to be refilled.  Pt states that Ozzie Hoyle has been trying to get in touch with our office about the medication.  Pt cannot remember what the name of it is.  It is a green pill for BP.  Pt is out of the medication now.  Initial call taken by: Harlon Flor,  June 18, 2010 2:53 PM     Appended Document: RX Attempted to contact pt, voicemailbox is full, unable to reach at work number. 06/20/10 /MES

## 2010-07-03 NOTE — Progress Notes (Signed)
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 NOT contagious. Now that is removed no Rx needed. Genrally we just watch him. If he wants I can do ID consult. Can discuss at fu. Ensure fu per prior ov Initial call taken by: Kalman Shan MD,  June 24, 2010 1:46 AM  Follow-up for Phone Call        pt set to se MR on 07-09-10 at 4:30. pt requested late appt. Carron Curie CMA  June 25, 2010 12:32 PM

## 2010-07-09 ENCOUNTER — Ambulatory Visit: Payer: 59 | Admitting: Internal Medicine

## 2010-07-09 ENCOUNTER — Encounter: Payer: Self-pay | Admitting: Internal Medicine

## 2010-07-09 ENCOUNTER — Ambulatory Visit (INDEPENDENT_AMBULATORY_CARE_PROVIDER_SITE_OTHER): Payer: 59 | Admitting: Internal Medicine

## 2010-07-09 DIAGNOSIS — J449 Chronic obstructive pulmonary disease, unspecified: Secondary | ICD-10-CM

## 2010-07-09 DIAGNOSIS — J984 Other disorders of lung: Secondary | ICD-10-CM

## 2010-07-18 NOTE — Assessment & Plan Note (Signed)
Summary: discuss bx results//jrc   Visit Type:  Follow-up Copy to:  Dr. [name] Primary Provider/Referring Provider:  [name], MD  CC:  Pt here for follow-up to discuss biopsy results.  Pt states doing well except for chest discomfort. [name]  History of Present Illness: RUL PET HOT intermediate prob nodule - ENB nondiagnostic 02/06/2010.  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 [name] EX-smoker - quit may 2011. Fnctional status  - Vo2 max 36ml/kg/min on CPST 02/01/2010   March 07, 2010. Follow up after biipsy and tests. ENB 02/06/2010 was indeterminate. Oncimmune panel 03/01/2010 ws negagtive. He feels well and at baseline. He is very frustrated about the nodule. He understands the non-diagnostic results of the biopsy and uncertainty. States family friend was monitored for years for nodule and it ended up with stage 4 cancer and a close relative was operated upon for nodule and was negative for malignancy. Therefore, he is torn if he wants this monitored or removed. He is aware of postoperative risk morbidity and mortality and potential need for O2 and loss of lung function if he has lobectomey. No new complaints.  REC: EVAL WITH DR Edwyna Shell   May 02, 2010: Foloowup for above. s/p RUL wede resection 03/28/2010. 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 We dicussed potential etiologies for granuloma. He admits to extensive travel to [place], [place] few years ago. Currenty feels well other than neuropathic  pan from incisional site. Dyspnea, cough are stable. No new issues. Smoking still in remission. No fever. No chills, No hemoptysis  July 09, 2010: followup for above. No interim complaints. Taking atrovent only as needed. Not interested in any mdi Rx. RUL bx cultures showing M. Gery Pray. So here to discuss. Saw Dr. Edwyna Shell 06/23/2010 - CXR clear (persinally reviewed). Still having post bx neuropathic pain. No associated fever,  weight loss, hemoptysis, chest pain. Feels well overall.     Preventive Screening-Counseling & Management  Alcohol-Tobacco     Smoking Status: quit > 6 months     Smoking Cessation Counseling: no     Smoke Cessation Stage: quit     Packs/Day: 1.0     Year Started: 1963     Year Quit: May 2011     Pack years: 26     Tobacco Counseling: not to resume use of tobacco products  Caffeine-Diet-Exercise     Does Patient Exercise: yes  Current Medications (verified): 1)  Lipitor 40 Mg Tabs (Atorvastatin Calcium) .... Take 1 Tab By Mouth At Bedtime 2)  Aspirin Ec 325 Mg Tbec (Aspirin) .... Take One Tablet By Mouth Daily 3)  Hydrochlorothiazide 12.5 Mg Caps (Hydrochlorothiazide) .... Take 1 Tablet By Mouth Once A Day 4)  Plavix 75 Mg Tabs (Clopidogrel Bisulfate) .... Take One Tablet By Mouth Daily 5)  Metoprolol Tartrate 25 Mg Tabs (Metoprolol Tartrate) .... Take One Tablet By Mouth Twice A Day 6)  Glipizide 5 Mg Tabs (Glipizide) .... Take 1 Tablet By Mouth Two Times A Day 7)  Nitrostat 0.4 Mg Subl (Nitroglycerin) .[name].. 1 Tablet Under Tongue At Onset of Chest Pain; You May Repeat Every 5 Minutes For Up To 3 Doses. 8)  Ventolin Hfa 108 (90 Base) Mcg/act Aers (Albuterol Sulfate) .[name].. 1-2 Puffs Four Times Daily As Needed 9)  Benicar 20 Mg Tabs (Olmesartan Medoxomil) .... Take 1 Tablet By Mouth Once A Day 10)  Atrovent Hfa 17 Mcg/act Aers (Ipratropium Bromide Hfa) .... 2 Puffs Four Times Daily  Allergies (verified): 1)  ! Pcn  Past History:  Past medical, surgical, family and social histories (including risk factors) reviewed, and no changes noted (except as noted below).  Past Medical History: 1.  CAD:  PCI 3/08 to OM2 and mid CFX.  NSTEMI 6/10.  Prior stents patent.  90% distal CFX, totally occluded mRCA (old) with collaterals.  EF 55% on LV-gram.  Xience DES 2.5 x 23 mm to distal CFX.  2.  DM2 3.  Hyperlipidemia 4.  COPD :   - quit tobacco 5/11.    - Gold Stage 2 with asthma component  - Fev1 1.53L/54%, 14% fev1 BD response, DLCO 54%- July 2011  - MM genotyple  01/07/2010  - unable to afford spiriva and unwilling to try ics due to prior renal failure: stated to Dr. Marchelle Gearing - Aug 2011 - started on atrovent HFA - fall 2011. No desaturation on walk test Dec 2011 5.  CKD, last creatinine 1.6 6.  obesity 7..  History of CVA without residual deficits 8.  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 No further Rx  Past Surgical History: Reviewed history from 11/14/2009 and no changes required. stents  Past Pulmonary History:  Pulmonary History:  PRIMARY DISCHARGE DIAGNOSIS: 10/06/2009  - 10/11/2009  1. Atypical pneumonia.   2. Bronchospasm questionable underlying asthma with chronic       obstructive pulmonary disease.      SECONDARY DISCHARGE DIAGNOSES:   1. History of cerebrovascular accident in 2003.   2. History of coronary artery disease status post percutaneous       transluminal coronary angioplasty in  March 2008.   3. Diabetes.   4. Hypertension.   5. Chronic kidney disease stage two.   6. Tobacco abuse.   7. Asthma as a child.  Family History: Reviewed history from 11/03/2008 and no changes required. Family history of stroke.   Social History: Reviewed history from 03/04/2010 and no changes required. 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  Quit in May 2011.  1ppd x 38 yrs Alcohol Use - yes Regular Exercise - yes Drug Use - no Smoking Status:  quit > 6 months  Review of Systems  The patient denies anorexia, fever, weight loss, weight gain, vision  loss, decreased hearing, hoarseness, chest pain, syncope, dyspnea on exertion, peripheral edema, prolonged cough, headaches, hemoptysis, abdominal pain, melena, hematochezia, severe indigestion/heartburn, hematuria, incontinence, genital sores, muscle weakness, suspicious skin lesions, transient blindness, difficulty walking, depression, unusual weight change, abnormal bleeding, enlarged lymph nodes, angioedema, breast masses, and testicular masses.    Vital Signs:  Patient profile:   55 year old male Height:      71 inches Weight:      246.13 pounds BMI:     34.45 O2 Sat:      98 % on Room air Temp:     98.1 degrees F oral Pulse rate:   89 / minute BP sitting:   110 / 80  (right arm) Cuff size:   regular  Vitals Entered By: Victorino Dike  Yancey Flemings CMA (July 09, 2010 4:34 PM)  O2 Flow:  Room air  CC: Pt here for follow-up to discuss biopsy results.  Pt states doing well except for chest discomfort.  Comments Medications reviewed with patient Carron Curie CMA  July 09, 2010 4:34 PM Daytime phone number verified with patient.    Physical Exam  General:  normal appearance, healthy appearing, and obese.   Head:  normocephalic and atraumatic Eyes:  PERRLA/EOM intact; conjunctiva and sclera clear Ears:  TMs intact and clear with normal canals Nose:  no deformity, discharge, inflammation, or lesions Mouth:  no deformity or lesions Neck:  no masses, thyromegaly, or abnormal cervical nodes Chest Wall:  mild right shoulder droop following wedge resection scar from recent surgery - mildly tender and mild induration + Lungs:  clear bilaterally to auscultation and percussion Heart:  regular rate and rhythm, S1, S2 without murmurs, rubs, gallops, or clicks Abdomen:  bowel sounds positive; abdomen soft and non-tender without masses, or organomegaly Msk:  no deformity or scoliosis noted with normal posture Pulses:  pulses normal Extremities:  no clubbing, cyanosis, edema, or deformity  noted Neurologic:  CN II-XII grossly intact with normal reflexes, coordination, muscle strength and tone Skin:  intact without lesions or rashes Cervical Nodes:  no significant adenopathy Axillary Nodes:  no significant adenopathy Psych:  flat affect   CXR  Procedure date:  06/12/2010  Findings:      clear  Comments:      independently reviwed  Impression & Recommendations:  Problem # 1:  PULMONARY NODULE, 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 s/p resection Fall 2011. CXR FEb 2012 is clear. Asymptomatic. Discussed risk factors - denies he could be HIV positive. States he was tested for this at PMD office and was negative. Refuses testing here. Therefore, COPD is suspected risk factor. No indicaton for ATT Rx. Will monitor clinically. 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 Patient Level III ([address])  Problem # 2:  C O P D (ICD-496) Assessment: Unchanged moderate disease before resection. Not inclined for any inhaler rx. Doing atrovent only as needed. He is open to SUMMITT study inhalers for COPD. Wil screen him  Medications Added to Medication List This Visit: 1)  Proair Hfa 108 (90 Base) Mcg/act Aers (Albuterol sulfate) .[name].. 1-2 puffs every 4-6 hours as needed  Patient Instructions: 1)  #RIght lung nodule/granuloma 2)   - this is mycobacterium kansasii 3)    - no interventions curently as discussed 4)   - wil place PPD skin test next visit 5)  #COPD 6)   - continue atrovent 7)   - try to gently exercise as allowd 8)   -i will have reserarch nurse call you regarding SUMMITT Study 9)  #RETURN  10)  -  9 months or sooner if needed Prescriptions: PROAIR HFA 108 (90 BASE) MCG/ACT  AERS (ALBUTEROL SULFATE) 1-2 puffs every 4-6 hours as needed  #1 x 4   Entered and Authorized by:   Kalman Shan MD   Signed by:   Kalman Shan MD on 07/09/2010   Method used:   Electronically to         [facility] [address] (retail)       [address]       [place], [place]  [address]       Ph: [phone]       Fax:  [phone]   RxID:   [id number]

## 2010-07-23 LAB — BASIC METABOLIC PANEL
BUN: 12 mg/dL (ref 6–23)
BUN: 17 mg/dL (ref 6–23)
CO2: 30 mEq/L (ref 19–32)
CO2: 30 mEq/L (ref 19–32)
CO2: 32 mEq/L (ref 19–32)
Calcium: 8.5 mg/dL (ref 8.4–10.5)
Chloride: 101 mEq/L (ref 96–112)
Chloride: 104 mEq/L (ref 96–112)
Creatinine, Ser: 1.4 mg/dL (ref 0.4–1.5)
Glucose, Bld: 116 mg/dL — ABNORMAL HIGH (ref 70–99)
Glucose, Bld: 131 mg/dL — ABNORMAL HIGH (ref 70–99)
Potassium: 3.6 mEq/L (ref 3.5–5.1)
Potassium: 4.1 mEq/L (ref 3.5–5.1)
Sodium: 138 mEq/L (ref 135–145)

## 2010-07-23 LAB — BLOOD GAS, ARTERIAL
Bicarbonate: 25.9 mEq/L — ABNORMAL HIGH (ref 20.0–24.0)
Drawn by: 181601
FIO2: 0.21 %
O2 Saturation: 97.5 %
pCO2 arterial: 43.3 mmHg (ref 35.0–45.0)
pH, Arterial: 7.394 (ref 7.350–7.450)
pO2, Arterial: 94 mmHg (ref 80.0–100.0)

## 2010-07-23 LAB — GLUCOSE, CAPILLARY
Glucose-Capillary: 103 mg/dL — ABNORMAL HIGH (ref 70–99)
Glucose-Capillary: 120 mg/dL — ABNORMAL HIGH (ref 70–99)
Glucose-Capillary: 123 mg/dL — ABNORMAL HIGH (ref 70–99)
Glucose-Capillary: 123 mg/dL — ABNORMAL HIGH (ref 70–99)
Glucose-Capillary: 127 mg/dL — ABNORMAL HIGH (ref 70–99)
Glucose-Capillary: 135 mg/dL — ABNORMAL HIGH (ref 70–99)
Glucose-Capillary: 142 mg/dL — ABNORMAL HIGH (ref 70–99)
Glucose-Capillary: 158 mg/dL — ABNORMAL HIGH (ref 70–99)
Glucose-Capillary: 164 mg/dL — ABNORMAL HIGH (ref 70–99)
Glucose-Capillary: 212 mg/dL — ABNORMAL HIGH (ref 70–99)
Glucose-Capillary: 217 mg/dL — ABNORMAL HIGH (ref 70–99)
Glucose-Capillary: 242 mg/dL — ABNORMAL HIGH (ref 70–99)
Glucose-Capillary: 87 mg/dL (ref 70–99)

## 2010-07-23 LAB — FUNGUS CULTURE W SMEAR
Fungal Smear: NONE SEEN
Fungal Smear: NONE SEEN

## 2010-07-23 LAB — CBC
HCT: 36.2 % — ABNORMAL LOW (ref 39.0–52.0)
HCT: 37.3 % — ABNORMAL LOW (ref 39.0–52.0)
HCT: 38.1 % — ABNORMAL LOW (ref 39.0–52.0)
HCT: 40.1 % (ref 39.0–52.0)
HCT: 44.6 % (ref 39.0–52.0)
Hemoglobin: 12.5 g/dL — ABNORMAL LOW (ref 13.0–17.0)
Hemoglobin: 12.6 g/dL — ABNORMAL LOW (ref 13.0–17.0)
MCH: 31.4 pg (ref 26.0–34.0)
MCH: 31.6 pg (ref 26.0–34.0)
MCH: 31.7 pg (ref 26.0–34.0)
MCHC: 33.1 g/dL (ref 30.0–36.0)
MCHC: 33.5 g/dL (ref 30.0–36.0)
MCHC: 34.3 g/dL (ref 30.0–36.0)
MCV: 91 fL (ref 78.0–100.0)
MCV: 93.3 fL (ref 78.0–100.0)
MCV: 94.1 fL (ref 78.0–100.0)
MCV: 95.7 fL (ref 78.0–100.0)
Platelets: 156 10*3/uL (ref 150–400)
RBC: 4.26 MIL/uL (ref 4.22–5.81)
RDW: 13.1 % (ref 11.5–15.5)
RDW: 13.3 % (ref 11.5–15.5)
RDW: 13.5 % (ref 11.5–15.5)
WBC: 10.1 10*3/uL (ref 4.0–10.5)

## 2010-07-23 LAB — POCT I-STAT 3, ART BLOOD GAS (G3+)
Acid-base deficit: 3 mmol/L — ABNORMAL HIGH (ref 0.0–2.0)
Bicarbonate: 29.4 mEq/L — ABNORMAL HIGH (ref 20.0–24.0)
O2 Saturation: 97 %
Patient temperature: 98
TCO2: 32 mmol/L (ref 0–100)
pCO2 arterial: 58.8 mmHg (ref 35.0–45.0)
pCO2 arterial: 63.7 mmHg (ref 35.0–45.0)
pH, Arterial: 7.29 — ABNORMAL LOW (ref 7.350–7.450)
pH, Arterial: 7.295 — ABNORMAL LOW (ref 7.350–7.450)
pO2, Arterial: 109 mmHg — ABNORMAL HIGH (ref 80.0–100.0)
pO2, Arterial: 97 mmHg (ref 80.0–100.0)

## 2010-07-23 LAB — TYPE AND SCREEN

## 2010-07-23 LAB — URINALYSIS, ROUTINE W REFLEX MICROSCOPIC
Ketones, ur: NEGATIVE mg/dL
Nitrite: NEGATIVE
Protein, ur: NEGATIVE mg/dL
Urobilinogen, UA: 0.2 mg/dL (ref 0.0–1.0)

## 2010-07-23 LAB — COMPREHENSIVE METABOLIC PANEL
ALT: 16 U/L (ref 0–53)
Albumin: 3.8 g/dL (ref 3.5–5.2)
BUN: 14 mg/dL (ref 6–23)
BUN: 20 mg/dL (ref 6–23)
CO2: 30 mEq/L (ref 19–32)
Calcium: 8.5 mg/dL (ref 8.4–10.5)
Creatinine, Ser: 1.4 mg/dL (ref 0.4–1.5)
Creatinine, Ser: 1.49 mg/dL (ref 0.4–1.5)
GFR calc non Af Amer: 53 mL/min — ABNORMAL LOW (ref >60)
Glucose, Bld: 116 mg/dL — ABNORMAL HIGH (ref 70–99)
Glucose, Bld: 128 mg/dL — ABNORMAL HIGH (ref 70–99)
Total Bilirubin: 0.4 mg/dL (ref 0.3–1.2)
Total Protein: 6.3 g/dL (ref 6.0–8.3)

## 2010-07-23 LAB — AFB CULTURE WITH SMEAR (NOT AT ARMC): Acid Fast Smear: NONE SEEN

## 2010-07-23 LAB — WOUND CULTURE: Culture: NO GROWTH

## 2010-07-23 LAB — PROTIME-INR
INR: 0.91 (ref 0.00–1.49)
Prothrombin Time: 12.5 seconds (ref 11.6–15.2)

## 2010-07-23 LAB — APTT: aPTT: 26 seconds (ref 24–37)

## 2010-07-23 LAB — TISSUE CULTURE: Culture: NO GROWTH

## 2010-07-25 LAB — GLUCOSE, CAPILLARY
Glucose-Capillary: 169 mg/dL — ABNORMAL HIGH (ref 70–99)
Glucose-Capillary: 110 mg/dL — ABNORMAL HIGH (ref 70–99)

## 2010-07-25 LAB — SURGICAL PCR SCREEN
MRSA, PCR: NEGATIVE
Staphylococcus aureus: NEGATIVE

## 2010-07-25 LAB — CBC
HCT: 43.8 % (ref 39.0–52.0)
Hemoglobin: 15.1 g/dL (ref 13.0–17.0)
MCHC: 34.5 g/dL (ref 30.0–36.0)
RBC: 4.68 MIL/uL (ref 4.22–5.81)

## 2010-07-25 LAB — FUNGUS CULTURE W SMEAR: Fungal Smear: NONE SEEN

## 2010-07-25 LAB — PROTIME-INR: INR: 0.97 (ref 0.00–1.49)

## 2010-07-25 LAB — BASIC METABOLIC PANEL
CO2: 27 mEq/L (ref 19–32)
GFR calc Af Amer: 54 mL/min — ABNORMAL LOW (ref >60)
GFR calc non Af Amer: 45 mL/min — ABNORMAL LOW (ref >60)
Glucose, Bld: 164 mg/dL — ABNORMAL HIGH (ref 70–99)
Potassium: 3.7 mEq/L (ref 3.5–5.1)
Sodium: 137 mEq/L (ref 135–145)

## 2010-07-25 LAB — CULTURE, RESPIRATORY W GRAM STAIN

## 2010-07-25 LAB — AFB CULTURE WITH SMEAR (NOT AT ARMC): Acid Fast Smear: NONE SEEN

## 2010-07-25 LAB — APTT: aPTT: 29 seconds (ref 24–37)

## 2010-07-29 LAB — GLUCOSE, CAPILLARY
Glucose-Capillary: 164 mg/dL — ABNORMAL HIGH (ref 70–99)
Glucose-Capillary: 256 mg/dL — ABNORMAL HIGH (ref 70–99)
Glucose-Capillary: 263 mg/dL — ABNORMAL HIGH (ref 70–99)
Glucose-Capillary: 113 mg/dL — ABNORMAL HIGH (ref 70–99)
Glucose-Capillary: 157 mg/dL — ABNORMAL HIGH (ref 70–99)
Glucose-Capillary: 288 mg/dL — ABNORMAL HIGH (ref 70–99)
Glucose-Capillary: 289 mg/dL — ABNORMAL HIGH (ref 70–99)
Glucose-Capillary: 296 mg/dL — ABNORMAL HIGH (ref 70–99)
Glucose-Capillary: 304 mg/dL — ABNORMAL HIGH (ref 70–99)
Glucose-Capillary: 308 mg/dL — ABNORMAL HIGH (ref 70–99)
Glucose-Capillary: 311 mg/dL — ABNORMAL HIGH (ref 70–99)
Glucose-Capillary: 427 mg/dL — ABNORMAL HIGH (ref 70–99)

## 2010-07-29 LAB — DIFFERENTIAL
Basophils Absolute: 0 10*3/uL (ref 0.0–0.1)
Basophils Relative: 0 % (ref 0–1)
Eosinophils Absolute: 0 10*3/uL (ref 0.0–0.7)
Eosinophils Absolute: 0.1 10*3/uL (ref 0.0–0.7)
Eosinophils Relative: 0 % (ref 0–5)
Eosinophils Relative: 1 % (ref 0–5)
Lymphocytes Relative: 5 % — ABNORMAL LOW (ref 12–46)
Lymphs Abs: 0.7 10*3/uL (ref 0.7–4.0)
Monocytes Relative: 5 % (ref 3–12)
Neutrophils Relative %: 86 % — ABNORMAL HIGH (ref 43–77)
Neutrophils Relative %: 95 % — ABNORMAL HIGH (ref 43–77)

## 2010-07-29 LAB — COMPREHENSIVE METABOLIC PANEL
AST: 18 U/L (ref 0–37)
CO2: 24 mEq/L (ref 19–32)
Chloride: 103 mEq/L (ref 96–112)
Creatinine, Ser: 1.57 mg/dL — ABNORMAL HIGH (ref 0.4–1.5)
GFR calc Af Amer: 56 mL/min — ABNORMAL LOW (ref >60)
GFR calc non Af Amer: 46 mL/min — ABNORMAL LOW (ref >60)
Glucose, Bld: 261 mg/dL — ABNORMAL HIGH (ref 70–99)
Total Bilirubin: 0.3 mg/dL (ref 0.3–1.2)

## 2010-07-29 LAB — CBC
HCT: 39.6 % (ref 39.0–52.0)
HCT: 45.2 % (ref 39.0–52.0)
Hemoglobin: 14.7 g/dL (ref 13.0–17.0)
Hemoglobin: 13.7 g/dL (ref 13.0–17.0)
Hemoglobin: 14.1 g/dL (ref 13.0–17.0)
MCHC: 33.6 g/dL (ref 30.0–36.0)
MCHC: 34.8 g/dL (ref 30.0–36.0)
MCV: 98.5 fL (ref 78.0–100.0)
MCV: 97.6 fL (ref 78.0–100.0)
MCV: 97.7 fL (ref 78.0–100.0)
RBC: 4.43 MIL/uL (ref 4.22–5.81)
RBC: 4.06 MIL/uL — ABNORMAL LOW (ref 4.22–5.81)
RBC: 4.14 MIL/uL — ABNORMAL LOW (ref 4.22–5.81)
RBC: 4.63 MIL/uL (ref 4.22–5.81)
WBC: 8.1 10*3/uL (ref 4.0–10.5)
WBC: 12.3 10*3/uL — ABNORMAL HIGH (ref 4.0–10.5)
WBC: 6.4 10*3/uL (ref 4.0–10.5)
WBC: 8.2 10*3/uL (ref 4.0–10.5)

## 2010-07-29 LAB — AFB CULTURE WITH SMEAR (NOT AT ARMC): Acid Fast Smear: NONE SEEN

## 2010-07-29 LAB — FUNGUS CULTURE W SMEAR

## 2010-07-29 LAB — FUNGAL ANTIBODIES PANEL, ID-BLOOD
Aspergillus fumigatus: NEGATIVE
Histoplasma Ab, Immunodiffusion: NEGATIVE

## 2010-07-29 LAB — CULTURE, BLOOD (ROUTINE X 2): Culture: NO GROWTH

## 2010-07-29 LAB — BASIC METABOLIC PANEL
BUN: 23 mg/dL (ref 6–23)
CO2: 27 mEq/L (ref 19–32)
CO2: 26 mEq/L (ref 19–32)
Calcium: 8.7 mg/dL (ref 8.4–10.5)
Chloride: 102 mEq/L (ref 96–112)
Chloride: 101 mEq/L (ref 96–112)
Creatinine, Ser: 1.66 mg/dL — ABNORMAL HIGH (ref 0.4–1.5)
GFR calc Af Amer: 57 mL/min — ABNORMAL LOW (ref >60)
GFR calc Af Amer: >60 mL/min (ref >60)
GFR calc non Af Amer: 51 mL/min — ABNORMAL LOW (ref >60)
Potassium: 4.4 mEq/L (ref 3.5–5.1)
Potassium: 4.1 mEq/L (ref 3.5–5.1)
Sodium: 135 mEq/L (ref 135–145)
Sodium: 136 mEq/L (ref 135–145)

## 2010-07-29 LAB — HEPATIC FUNCTION PANEL
ALT: 20 U/L (ref 0–53)
AST: 27 U/L (ref 0–37)
Alkaline Phosphatase: 64 U/L (ref 39–117)
Bilirubin, Direct: 0.1 mg/dL (ref 0.0–0.3)
Indirect Bilirubin: 0.4 mg/dL (ref 0.3–0.9)
Total Bilirubin: 0.5 mg/dL (ref 0.3–1.2)

## 2010-07-29 LAB — LEGIONELLA PROFILE(CULTURE+DFA/SMEAR): Legionella Antigen (DFA): NEGATIVE

## 2010-07-29 LAB — LACTIC ACID, PLASMA: Lactic Acid, Venous: 3 mmol/L — ABNORMAL HIGH (ref 0.5–2.2)

## 2010-07-29 LAB — CULTURE, RESPIRATORY W GRAM STAIN

## 2010-07-29 LAB — EXPECTORATED SPUTUM ASSESSMENT W GRAM STAIN, RFLX TO RESP C

## 2010-07-29 LAB — PROTIME-INR: Prothrombin Time: 13.4 seconds (ref 11.6–15.2)

## 2010-07-29 LAB — MAGNESIUM: Magnesium: 2.4 mg/dL (ref 1.5–2.5)

## 2010-08-08 ENCOUNTER — Other Ambulatory Visit: Payer: Self-pay | Admitting: Cardiology

## 2010-08-08 NOTE — Telephone Encounter (Signed)
[address] °

## 2010-08-12 ENCOUNTER — Other Ambulatory Visit: Payer: Self-pay | Admitting: Thoracic Surgery

## 2010-08-12 DIAGNOSIS — D381 Neoplasm of uncertain behavior of trachea, bronchus and lung: Secondary | ICD-10-CM

## 2010-08-13 ENCOUNTER — Ambulatory Visit: Payer: 59 | Admitting: Thoracic Surgery

## 2010-09-24 NOTE — Letter (Signed)
April 10, 2010   Kalman Shan, MD  [address] [place] [address]  [place] [address]   Re:  [name], [name]                 DOB:  [birth year]   Dear Dr. Marchelle Gearing:   She came today and removed his chest tube sutures.  Incision is well-  healed.  Chest x-ray showed normal postoperative changes.  He is having  some neurogenic pain, so we added Neurontin to 300 mg 2 twice a day.  His blood pressure is 122/74, pulse 88, and respirations 18.  Overall,  he is making satisfactory recovery.  We will see him back again in 3  weeks with a chest x-ray.   Ines Bloomer, M.D.  Electronically Signed   DPB/MEDQ  D:  04/10/2010  T:  04/11/2010  Job:  [id number]

## 2010-09-24 NOTE — Assessment & Plan Note (Signed)
[facility] OFFICE NOTE   NAME:SHOEKahle, Mcqueen                        MRN:          [id number]  DATE:01/04/2008                            DOB:          03-Apr-1956    Mr. Follansbee comes in today for followup.  He has lost from 233-182 since  February!  He is now on p.r.n. insulin, which is just great news.  He  feels better.   He still smokes a couple of cigarettes a day, but he is doing so well  with his diet.  He denies any angina or ischemic symptoms.   His meds are,  1. Metoprolol 25 b.i.d.  2. Simvastatin 80 mg a day.  3. Enteric-coated aspirin 325 a day.  4. Enalapril/hydrochlorothiazide 5/12.5 daily.  5. Glipizide 5 mg a day.   His blood pressure is 118/68, his pulse is 68 and regular, and his  weight is 182.  HEENT is unchanged.  Carotids are full without bruits.  Thyroid is not enlarged.  Trachea is midline.  Lungs were clear without  rhonchi.  Heart reveals a nondisplaced PMI and normal S1 and S2.  Abdominal exam is protuberant and good bowel sounds.  No midline bruit.  No hepatomegaly.  Extremities reveal no cyanosis, clubbing, or edema.  Pulses are intact.   EKG is completely normal.   ASSESSMENT/PLAN:  Mr. Carstarphen is doing remarkably well.  I am ecstatic  about his weight loss.  His chance now is to keep it off, which he  thinks he can.  There has been a major lifestyle change, however.   Assuming he is doing well, we will see him back again in 6 months.     Thomas C. Daleen Squibb, MD, [facility]  Electronically Signed    TCW/MedQ  DD: 01/04/2008  DT: 01/05/2008  Job #: [id number]

## 2010-09-24 NOTE — Assessment & Plan Note (Signed)
[facility] OFFICE NOTE   NAME:SHOEJashad, [name]                        MRN:          [id number]  DATE:07/08/2007                            DOB:          1955-09-04    She returns today.   PROBLEM LIST:  Nonobstructive coronary artery disease.  He is status  post two drug-eluting stents; one to the obtuse marginal of the  circumflex and one to the circumflex proper March 2008 at the time of  non-ST segment elevation MI.  He has been on Plavix for almost a year.  He is also on aspirin.  He has multiple risk factors, and now he tells  me that he is diabetic.  He has continued to struggle with his weight.   His blood pressures have been low if anything.  I lowered his enalapril  HCTZ to 5/12.5 last year.  He is not symptomatic with this.   His laboratory data is being followed by Dr. Elease Hashimoto, and we have an  excellent lipid profile on him as well.  November 13, 2006 his total  cholesterol at that time was 121, triglycerides 130, LDL 71.  Unfortunately his HDL was 24, but actually it increased from 19.  His  LFT's were normal.   He is not having any symptoms of angina or ischemia present.   MEDICATIONS:  1. Insulin injectable, but he does not know the dose.  2. Glipizide 5 mg a day.  3. Enalapril HCTZ 5/12.5 mg daily.  4. Enteric-coated aspirin 325 mg a day.  5. Plavix 75 mg a day.  6. Simvastatin 80 mg a day.  7. Metoprolol 25 mg p.o. b.i.d.  8. He carries sublingual nitroglycerin.   He is employed and does have insurance at the present time.  Plavix,  however, has cost him well over $120 a month.   PHYSICAL EXAMINATION:  VITAL SIGNS:  Blood pressure today is 88/60,  pulse 89 and regular, weight is 233 up 7 from last summer.  HEENT:  Normocephalic, atraumatic.  PERRLA.  Extraocular movements  intact.  Sclerae are clear.  Face symmetry is normal.  NECK:  Carotid upstrokes are equal bilaterally without bruits, no  JVD.  Thyroid is not enlarged.  Trachea is midline.  LUNGS:  Clear.  HEART:  Reveals a nondisplaced PMI, normal S1, S2.  ABDOMEN:  Protuberant with good bowel sounds.  Organomegaly cannot be  adequately assessed.  EXTREMITIES:  With no cyanosis, clubbing or edema.  Pulses are intact.   Electrocardiogram is normal.   I have had a long talk greater than 20 minutes with [name] today.  I  think his greatest challenge right now is losing weight and keeping it  off.  He can most likely rid himself of insulin which is a major problem  as he knows.  It would also substantially decrease his risk of future  vascular event including renal disease.   PLAN:  1. Try to lose 25 pounds over the next six months which is about a      pound per week.  2. Continue current medications except he can stop his Plavix now.      The risk of a subacute or acute thrombosis is low, and he and I are      comfortable with this risk as discussed.  3. Continue other medications.  4. See me back in six months.     Thomas C. Daleen Squibb, MD, [facility]  Electronically Signed    TCW/MedQ  DD: 07/08/2007  DT: 07/09/2007  Job #: [id number]   cc:   Lorie Phenix

## 2010-09-24 NOTE — Assessment & Plan Note (Signed)
[facility] OFFICE NOTE   NAME:SHOEColton, Tassin                        MRN:          [id number]  DATE:11/16/2006                            DOB:          May 14, 1955    [name] returns today.  He was admitted to [facility]  with pneumonia.  We have no records.   I saw him last on August 17, 2006.  Please refer to that note.   He started smoking again.  He is trying to follow his diet which,  according to his lipids, looks like he probably is doing a good job.  He  is very stressed out about his bills at [facility] and now [employer].   His insurance is covering Plavix.   He is having no angina or ischemic symptoms.   He has had some dizziness.   His last lipids showed a total cholesterol of 121, triglycerides 130,  HDL had increased from 19 to 24, LDL dropped from 101 to 71.  LFTs were  normal.  He is on simvastatin 80 mg a day.   OTHER MEDICATIONS:  1. Enalapril/HCT 10/25 daily.  2. Metoprolol 25 b.i.d.  3. Plavix 75 mg daily.  4. Enteric coated aspirin 325 a day.  5. Sublingual nitroglycerin p.r.n.   PHYSICAL EXAMINATION:  VITAL SIGNS:  Blood pressure 90/60, pulse 80 and  regular, weight 226.  HEENT:  Normocephalic, atraumatic.  PERRLA.  Extraocular movements  intact.  Sclerae clear.  Facial symmetry is normal.  NECK:  Carotids are full, without bruits.  LUNGS:  Clear, except for some rhonchi.  HEART:  Reveals a nondisplaced PMI.  He has a normal S1, S2.  ABDOMEN:  With good bowel sounds.  EXTREMITIES:  Revealed no edema.  Pulses are intact.  NEUROLOGIC:  Intact.   [name] is doing well.  I have cut his enalapril back to 5/12.5 daily,  with the hydrochlorothiazide combination.  We will continue his other  medications.  I will see him back in three months.  I have encouraged  him not to smoke.     Thomas C. Daleen Squibb, MD, [facility]  Electronically Signed    TCW/MedQ  DD: 11/16/2006  DT:  11/16/2006  Job #: [id number]   cc:   [name]

## 2010-09-24 NOTE — Letter (Signed)
May 01, 2010   Kalman Shan, MD  [address] [place] [address]  [place] [address]   Re:  [name], [name]                 DOB:  1955/12/04   Dear Carmin Muskrat,   I saw the patient back today.  His blood pressure is 128/70, pulse 87,  respirations 18, sats were 98%, temp was 97.  As you know, his initial  cultures came back for mycobacterium and he presents today for  evaluation.  His chest x-ray today showed normal postoperative changes.  He is breathing better since his surgery.  Overall, I think he is making  good progress.  I will plan to see him back again in 6 weeks with a  chest x-ray.   Ines Bloomer, M.D.  Electronically Signed   DPB/MEDQ  D:  05/01/2010  T:  05/01/2010  Job:  [id number]   cc:   [name]

## 2010-09-24 NOTE — Letter (Signed)
March 19, 2010   Kalman Shan, MD  [address] [place] [address]  [place], [place] [address]   Re:  [name], [name]                 DOB:  14-Sep-1955   Dear Dr. Marchelle Gearing:   The patient presents with multiple medical problems.  He has got  cerebrovascular disease and had a cerebrovascular accident in 2002.  He  has coronary artery disease and had a PTCA in March 2008.  He has  diabetes mellitus type 2, chronic renal disease, and hypertension.  He  has a long history of tobacco abuse.  He was in the hospital with  pneumonia when he was found to have a right upper lobe lesion and this  was followed and got a PET scan which showed an SUV of 4.2.  He  underwent electromagnetic navigational bronchoscopy by Dr. Delton Coombes which  revealed no evidence of cancer, but apparently on one of the specimens,  there was some evidence of atypia.  Pulmonary function tests showed an  FVC of 4.6 with an FEV1 of 2.11, which is 54% predicted.  He had a  cardiopulmonary exercise test and which was acceptable for lobectomy.  His diffusion capacity was 54%.   ALLERGIES:  He is allergic to penicillin.   MEDICATIONS:  1. Lipitor 40 mg a day.  2. Aspirin.  3. Hydrochlorothiazide 12.5 mg a day.  4. Plavix 75 mg a day.  5. Metoprolol 25 mg twice a day.  6. Glipizide 5 mg twice a day.  7. Nitrostat.  8. Ventolin.  9. Benicar 20 mg daily.   PAST MEDICAL HISTORY:  He also has a history of bronchospasm and asthma  as a child.   FAMILY HISTORY:  Noncontributory except there is a history of cardiac  disease.   SOCIAL HISTORY:  He is married.  He works as a Horticulturist, commercial.  He quit  smoking in 2010.  Does not drink alcohol on a regular basis.   REVIEW OF SYSTEMS:  GENERAL:  His weight has been stable.  CARDIAC:  See history of present illness.  PULMONARY:  See history of present illness.  GI:  No nausea, vomiting, constipation, or diarrhea.  GU:  See past medical history.  VASCULAR:  No claudication, DVT, or  TIAs.  NEUROLOGICAL:  No dizziness, headaches, blackouts, or seizures.  MUSCULOSKELETAL:  No arthritis.  PSYCHIATRIC:  No depression or nervousness.  EYE/ENT:  No changes in eyesight or hearing.  HEMATOLOGICAL:  No problems with bleeding, clotting disorders, or  anemia.   PHYSICAL EXAMINATION:  General:  This is a slightly obese Caucasian  male, in no acute distress.  Vital Signs:  His blood pressure was  121/75, pulse 94, respirations 18, and sats were 95%.  Head, Eyes, Ears,  Nose, and Throat:  Unremarkable.  Neck:  Supple without thyromegaly.  There is no supraclavicular or axillary adenopathy.  Chest:  Clear to  auscultation and percussion.  Heart:  Regular, sinus rhythm.  No  murmurs.  Abdomen:  Soft.  There is no hepatosplenomegaly.  Extremities:  Pulses are 2+.  There is no clubbing or edema.  Neurological:  He is  oriented x3.  Sensory and motor intact.  Cranial nerves intact.   I feel that it is hard to tell about this right upper lobe lesion.  I  would recommend a VATS wedge resection of this and if it is cancer, he  may require right upper lobectomy or  we can get by with an apical  posterior segmentectomy and node dissection.  We plan to do this on the  18th at [facility].  He understands the risks of the surgery.  I  appreciate the opportunity of seeing the patient.   Sincerely,   Ines Bloomer, M.D.  Electronically Signed   DPB/MEDQ  D:  03/19/2010  T:  03/20/2010  Job:  [id number]

## 2010-09-27 NOTE — H&P (Signed)
[name], [name]                           ACCOUNT NO.:  [id number]   MEDICAL RECORD NO.:  [id number]                   PATIENT TYPE:  INP   LOCATION:  3021                                 FACILITY:  [facility]   PHYSICIAN:  Deanna Artis. Sharene Skeans, M.D.           DATE OF BIRTH:  [birth year]   DATE OF ADMISSION:  12/27/2001  DATE OF DISCHARGE:                                HISTORY & PHYSICAL   CHIEF COMPLAINT:  Trouble speaking.   HISTORY OF THE PRESENT CONDITION:  The patient is a 55 year old right-handed  single Caucasian gentleman, sudden onset 7:30 p.m., with inability to say  the correct words in his sentences.  In his words at triage, he knows what  he wants to say but the wrong words are coming out of his mouth.  This  persisted.  He decided to eat dinner and arrived at [facility] at  2148 hours.  On return to the hospital, his symptoms subsided but persisted  to a mild degree before disappearing.  His symptoms are unassociated with  headache, nausea, vomiting, diplopia, dysphagia, weakness, numbness,  tingling, or loss of bowel and bladder control or loss of consciousness.   RISK FACTORS FOR STROKE:  Hypertension (we do not know if this new onset);  obesity (50-60 pound weight gain over the past 8 years).   PAST MEDICAL HISTORY:  The patient has never been admitted to the hospital,  has had no closed-head injuries, no serious illnesses including sexually  transmitted infections.   REVIEW OF SYSTEMS:  GENERAL: The patient has had normal appetite and sleep.  No night sweats.  EAR/NOSE AND THROAT: No infection.  CARDIOVASCULAR: No  chest pain, arrhythmia, or hypotension (known).  RESPIRATIONS: No asthma or  bronchitis or pneumonia but he has a persistent cough and claims that it is  related to allergies.  GI: No nausea, vomiting, diarrhea, or hepatopathy.  GU: No urinary tract infection or hematuria.  MUSCULOSKELETAL: No  osteoarthritis or fractures.  SKIN: No lesions  or cutaneous lesions.  ENDOCRINE: No diabetes or thyroid disease.  HEMATOLOGY: No bleeding or  bruisability.  ALLERGY/LYMPHATIC SYSTEM: He complains of environmental  allergies; no immune or autoimmune disorders.  REPRODUCTIVE: No complaints.  PSYCHIATRIC: No psychiatric.  The patient's mother died of a prolonged  illness (see below) in 1995.  He has gained his weight since that time, one  wonders about a reactive depression.  NEUROLOGIC: See above.  Review of  systems otherwise negative.   PAST SURGICAL HISTORY:  None.   MEDICATIONS:  None.   ALLERGIES:  PENICILLIN which causes rash and hives.   FAMILY HISTORY:  Mother died age 46 eight years after a massive left  cerebrovascular accident, she had diabetes (death in 59).  Father is in  his 4s, he has COPD and is disabled from that, he has hypertension, he had  two repairs of an abdominal aortic aneurysm.  Sister is alive and well.  Brother has atherosclerotic cardiovascular disease and has had angioplasty  and stent.  Father's side has had problems with COPD and atherosclerotic  cardiovascular disease.  Mother's side maternal grandmother had a stroke.   SOCIAL HISTORY:  He is single.  The patient smokes a half a pack of  cigarettes per day between ages 68 and 19.  He has rare alcohol use.  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  He was primary  caretaker for his mother for the 8 years of her declining health.   EXAMINATION TODAY:  VITAL SIGNS: Blood pressure 154/98, resting pulse 75,  respirations 20, afebrile.  EAR/NOSE/THROAT: No signs of infection; no bruits.  LUNGS: Clear.  HEART: No murmurs.  Pulses normal.  ABDOMEN: Soft, bowel sounds normal; no hepatosplenomegaly.  EXTREMITIES: Normal without edema, cyanosis, alterations in tone or tight  heel cords.  NEUROLOGIC: The patient is awake, alert; no dysphasia or dyspraxia.  He is  oriented.  He has normal fund of knowledge and normal memories for  recent  and remote events.  Cranial nerves: Round, reactive pupils, normal fundi,  visual fields full to double simultaneous stimuli, okay and responses equal.  Symmetric facial strength.  Midline tongue and uvula.  Air conduction  greater than bone conduction bilaterally.  MOTOR: Normal strength, tone, and mass.  Good fine motor movements.  No  pronator drift.  SENSATION: Intact to cold, vibration, stereognosis.  CEREBELLAR: Good finger-to-nose, rapid repetitive movements, gait normal, he  is able to walk on his heels and toes and perform a tandem.  DEEP TENDON REFLEXES: Normal.  He has bilateral flexor plantar responses.   IMPRESSION:  Transient ischemic attack left brain.   PLAN:  1. The patient has had a cranial CT scan which I have reviewed and is normal     other than hyperemia in the left parietal region with contrast.  2. EKG shows a normal sinus rhythm.  His other labs are pending at this     time.  3. The patient will have an MRI brain/MRA intracranial and extracranial if     he can tolerate it.  If not a carotid Doppler of his neck vessels and 2-D     echocardiogram.  We will carry out blood work for fasting serum blood     glucose, serum homocysteine, anticardiolipin antibodies, protein S,     protein C, antithrombin III, TSH, and sedimentation rate.   The patient will be placed on aspirin 325 mg per day, hydrochlorothiazide 25  mg per day.  We will consider the addition of an angiotensin-converting  enzyme inhibitor for blood pressure.  The patient will be given Nicorette CQ  14 mg patch and he will not smoke during this hospitalization.  Overall, the  plan is to define his risk factors and provide appropriate secondary stroke  prevention and discharge him to home unless some other unexpected situation  is found such as nocturia or venous malformation, a vascular tumor, or a  significant more proximal occlusion of a vessel.  It would appear at this point that unless he  has some form of patent foramen ovale that the heart is  not involved with this particular situation.  Deanna Artis. Sharene Skeans, M.D.    [facility]  D:  12/28/2001  T:  12/29/2001  Job:  [id number]   cc:   Elliot Cousin, M.D.  [facility]  [address]  [place], [place] [address]

## 2010-09-27 NOTE — Discharge Summary (Signed)
NAMESTEEL, KERNEY                 ACCOUNT NO.:  [id number]   MEDICAL RECORD NO.:  [id number]          PATIENT TYPE:  INP   LOCATION:  3713                         FACILITY:  [facility]   PHYSICIAN:  Jesse Sans. Wall, MD, FACCDATE OF BIRTH:  [birth year]   DATE OF ADMISSION:  08/05/2006  DATE OF DISCHARGE:  08/08/2006                               DISCHARGE SUMMARY   PROCEDURES:  1. Cardiac catheterization.  2. Coronary arteriogram.  3. Left ventriculogram.  4. Overnight oximetry  5. PTCA and drug-eluting stent x2.   PRIMARY DIAGNOSIS:  Non-ST segment elevation myocardial infarction.   SECONDARY DIAGNOSES:  1. Family history of coronary artery disease.  2. Hypertension.  3. Remote history of asthma as a child.  4. Prior to transient ischemic attack/possible cerebrovascular      accident.  5. Allergy or intolerance to penicillin.  6. Ongoing tobacco use.  7. Unknown lipid status.   TIME AT DISCHARGE:  Forty-eight minutes.   HOSPITAL COURSE:  Mr. Saiz is a 55 year old male with no previous  history of coronary artery disease.  He had stuttering chest pain for 3  days prior to admission and came to the emergency room when it got  worse.  He was admitted for further evaluation and treatment.   Initially, he was made pain free with medical therapy, but on August 06, 2006, he had chest tightness requiring nitroglycerin and was taken  urgently to the cath lab.   The cardiac catheterization showed a nondominant RCA that was totalled.  There was a 70% stenosis in the circumflex and a 99% stenosis in the OM,  that were both treated with drug-eluting stents.  He had a 50% stenosis  in the distal circumflex, and a 30% stenosis in the LAD, for which  medical therapy is recommended.  His EF was 55%.   He was started on medications for blood pressure control as his  diastolic was consistently over 100.  He was started on a beta blocker  and ACE inhibitor and a diuretic.  These were up  titrated for better  blood pressure control.  There was concern for sleep apnea, so he was  put on overnight oximetry.   An overnight oximetry:  His total duration of time with an SPO2 of less  than 89% was 1 minute 10 seconds.  The total study duration was greater  than 12 hours.  He had 91 desaturations and some of these were  associated with elevations in his heart rate.  However, because there  was very little time spent below 89%, and because he had no critical  desaturations, he can be followed as an outpatient.   On his initial blood work, his blood sugar, that was non-fasting, was  277.  A fasting blood sugar was 146.  Hemoglobin A1c has been ordered,  but has not yet been drawn.  A lipid profile is also scheduled to be  done.  He was started empirically on Lipitor 80 mg a day but financial  issues are significant and his best option is to get his medications at  [facility] where a 90-day prescription is $15.   Mr. Ohlin was agreeable to quitting smoking.  He is given information on  a heart-healthy diabetic diet with the understanding that he has not yet  been diagnosed as a diabetic, although he states several years ago he  was told he was borderline.  He has not seen his family physician in 2  years, but is advised to follow up with him as soon as possible as well.  He lives in [place] and wishes to be followed there.  Pending  evaluation by Dr. Daleen Squibb, he is in the tentatively considered stable for  discharge with outpatient followup pending.   DISCHARGE INSTRUCTIONS:  1. His activity level is to be increased gradually.  2. He is to call our office for any problems with the cath site.  3. He is not to use tobacco.  4. He is to stick to a heart-healthy diabetic diet.  5. He is to follow up with Dr. Daleen Squibb and a message will be left for an      appointment.  6. He is to follow up with Dr. Sherrie Mustache in [place] as well.   DISCHARGE MEDICATIONS:  1. Enalapril HCT 10/25 mg  daily.  2. Simvastatin 80 mg a day.  3. Toprol 25 mg b.i.d.  4. Coated aspirin 325 mg daily.  5. Plavix 75 mg daily.  6. Nitroglycerin sublingual p.r.n.      Theodore Demark, PA-C      Jesse Sans. Daleen Squibb, MD, [facility]  Electronically Signed    RB/MEDQ  D:  08/08/2006  T:  08/08/2006  Job:  [id number]   cc:   Sherrie Mustache, Dr.

## 2010-09-27 NOTE — Discharge Summary (Signed)
[name], [name]                           ACCOUNT NO.:  [id number]   MEDICAL RECORD NO.:  [id number]                   PATIENT TYPE:  INP   LOCATION:  3021                                 FACILITY:  [facility]   PHYSICIAN:  Marlan Palau, M.D.               DATE OF BIRTH:  19-Jan-1956   DATE OF ADMISSION:  12/27/2001  DATE OF DISCHARGE:  12/30/2001                                 DISCHARGE SUMMARY   ADMISSION DIAGNOSIS:  Transient aphagia rule out left brain transient  ischemic attack or stroke.   DISCHARGE DIAGNOSIS:  1. Left inferoanterior temporal lobe stroke/cerebrovascular infarction.  2. Hypertension.  3. Obesity.  4. Tobacco abuse.   PROCEDURES:  1. CT of the head.  2. MRI of the brain.  3. MRA scan.  4. 2D echocardiogram.  5. Carotid doppler.  6. Transesophageal echocardiogram.   COMPLICATIONS OF PROCEDURES:  None.   HISTORY OF PRESENT ILLNESS:  [name] is a 55 year old right-hand  white male born on Sep 27, 2055, with a history of hypertension, and obesity.  The patient noted onset of  problem with verbalizations, noted that he would  say the wrong words that persisted over an hour or two.  The patient had  some improvement in his symptomatology eventually return to normal.  The  patient denied any headache, visual field changes, numbness or weakness on  the face, arms, or legs.   PAST MEDICAL HISTORY:  Significant for:  1. History of new onset speech disturbance, left brain stroke.  2. Obesity.  3. Hypertension.  4. Tobacco abuse.   MEDICATIONS PRIOR TO ADMISSION:  None.   PAST SURGICAL HISTORY:  None.   ALLERGIES:  PENICILLIN.   SOCIAL HISTORY:  The patient smokes 1/2 pack of cigarettes a day.  Does not  drink alcohol regularly.  Please refer to the history and physical dictation  for social history, family history, review of history, physical examination.   LABORATORY DATA:  Notable for a homocystine level of 19.52, TSH of 2.04,  white count of  7.4, hemoglobin 16.1, hematocrit of 46.4.  MCV of 93.2.  Platelets of 210,000, sed rate of 4, INR of 0.9.  Cholesterol level of 161,  triglycerides of 149.  TSH of 2.04, HDL cholesterol of 27, VLDL 30, LDL 104.  Anticardiolipin antibodies are pending.  Urinalysis reveals specific gravity  1.014, pH of 6.0, antithrombin 3 level of 83 which is normal.  Protein S and  protein C levels are pending.   EKG reveals normal sinus rhythm, normal EKG.  Heart rate is 66.   HOSPITAL COURSE:  This patient was admitted to the hospital for evaluation  for transient speech disturbance.  The patient was placed on aspirin, was  set for an MRI scan of the brain which showed a moderate sized acute  nonhemorrhagic stroke involving the posterior aspect of the left sub-insular  region, periopercular region.  MRI angiogram shows pruning of the left  middle cerebral vessels, high grade stenosis at the origin of the right  vertebral artery was seen.  CT of the head initially was unremarkable.  A 2D  echocardiogram was performed showing ejection fraction of 55-65 percent.  Left atrium was mildly dilated.  A transesophageal echocardiogram was set up  and the patient underwent this study showing no evidence of a patent foramen  ovale and no other significant abnormalities were seen.  Normal thoracic  aorta was seen.   The patient clinically is at baseline and is to be sent home today taking  aspirin 325 mg one daily.  Will also place patient on folate.  Recheck  homocystine level when the patient is seen in the office in 3 weeks.  The  patient is to have no strenuous activity in 4 weeks.  May return to work  within the next several days, as patient works as a Retail buyer.  The  patient is to contact the office immediately if any further problems arise.  The patient will try to stop smoking.  The patient has no focal motor or  sensory deficits.  No speech disturbance or visual field disturbance at the  time of  discharge.                                               Marlan Palau, M.D.    CKW/MEDQ  D:  12/30/2001  T:  01/03/2002  Job:  [id number]   cc:   [facility]  [address]   Elliot Cousin, M.D.

## 2010-09-27 NOTE — H&P (Signed)
[name]                 ACCOUNT NO.:  [id number]   MEDICAL RECORD NO.:  [id number]          PATIENT TYPE:  INP   LOCATION:  1823                         FACILITY:  [facility]   PHYSICIAN:  Learta Codding, MD,FACC DATE OF BIRTH:  April 08, 1956   DATE OF ADMISSION:  08/05/2006  DATE OF DISCHARGE:                              HISTORY & PHYSICAL   CHIEF COMPLAINT:  Chest pain.   HISTORY OF PRESENT ILLNESS:  [name] is a 55 year old smoker who has  no doctor, has a history of stroke 3 years ago, has had stuttering chest  pain for 3 days.  He describes a substernal ache, which radiates down  his left arm, which lasts for several hours.  Seems to be worse with  exertion and is associated with shortness of breath on exertion.  He  denies any lower extremity edema or orthopnea.  He has had no syncope or  palpitations.  The symptoms started 3 days ago.  On the second day, the  patient began taking aspirin with some symptomatic improvement.  Today  symptoms however, seemed worse and so he sought attention in the  emergency room.  Upon arrival to the emergency room he was found to be  extremely hypertensive with a blood pressure of 194/109.  After 1  sublingual nitroglycerin his blood pressure dropped to 157/67 and his  chest pain resolved.   PAST MEDICAL HISTORY:  1. Asthma as a child, currently without requirements of inhalers or      any other medications.  2. Prior stroke.   FAMILY HISTORY:  Positive for coronary artery disease in a brother and a  stroke in mother.   SOCIAL HISTORY:  Cigarettes.   ALLERGIES:  PENICILLIN.   MEDICATIONS:  1. Aspirin.  2. Folic acid.   REVIEW OF SYSTEMS:  Otherwise negative.   PHYSICAL EXAMINATION:  VITAL SIGNS:  Temperature 98, pulse 80,  respiratory rate 18, blood pressure 180/100, current blood pressure on  my exam was 157/67.  GENERAL:  He or alert and oriented x3 with no apparent distress.  NECK:  Supple without bruits bilaterally.   Thyroid is normal.  CHEST:  Clear to auscultation bilaterally.  CARDIOVASCULAR EXAM:  Shows 2+ pulses in bilateral femoral areas, radial  areas, and carotid normal contour.  S1, S2 are normal.  There are no  murmurs, rubs, or gallop.  ABDOMEN:  Soft and nontender and obese.  EXTREMITIES:  Show full range of motion of all extremities.  SKIN:  Showed no lesions.  NEUROLOGICAL EXAM:  Nonfocal.   LABORATORY DATA:  Currently are pending.   IMPRESSION:  This is unstable angina in a 55 year old smoker with a  family history of coronary artery disease.  The symptoms have been  ongoing for 3 days.  There is no occur of injury on EKG.   PLAN:  Plan of action will be a treadmill stress test if enzymes remain  negative and patient remains chest pain free.      Daniel B. Haithcock, MD   Electronically Signed     ______________________________  Learta Codding, MD,FACC  DBH/MEDQ  D:  08/05/2006  T:  08/06/2006  Job:  [id number]

## 2010-09-27 NOTE — Assessment & Plan Note (Signed)
[facility] OFFICE NOTE   NAME:Follette, AREON COCUZZA                        MRN:          [id number]  DATE:08/17/2006                            DOB:          12/18/1955    [name] returns today after being discharged from [facility] on  August 08, 2006 with a non-ST segment elevation infarct.   He has multiple cardiac risk factors including a family history of  coronary disease, hypertension, tobacco use which he has quit and  severely depressed HDL.   He underwent coronary angiography.  His EF was 55%.  He had drug-eluting  stents placed to an obtuse marginal of the circumflex, as well as a 70%  stenosis in the circumflex proper.  He had residual 30% stenosis in the  LAD, 50% distal circumflex lesion and a totally occluded non-dominant  right.   He has also had a history of a stroke in the past, treated [facility]  neurology in 2005.  Echocardiogram at that time showed no cardio-embolic  source of his stroke.   Since discharge, he has quit smoking.  He has thrown out all of his food  and has gotten healthy food.  He refuses to go out to eat.  He is very  stressed, in that he did not have insurance at the time of his event,  and he is actually going to [facility] today to talk to them about  what to do about his bill.   He spent the large majority of his life taking care of his grandparents  and his parents.  He thinks this has a lot to do with him being in the  shape he is in.   CURRENT MEDICATIONS:  He is currently on:  1. Enalapril HCT 10/25 q. day.  2. Simvastatin 80 mg a day.  3. Toprol XL 25 b.i.d.  4. Coated aspirin 325 a day.  5. Plavix 75 mg a day.  6. Nitroglycerin p.r.n.   EXAMINATION:  GENERAL:  He is in no acute distress.  VITAL SIGNS:  His blood pressure is 110/78.  His pulse is 67 and  regular.  His electrocardiogram demonstrates sinus rhythm with non-  specific ST segment changes.   Weight is 237.  HEENT:  Normocephalic, atraumatic, PERRLA, extraocular movements intact,  sclerae clear.  Facial symmetry is normal, dentition satisfactory.  NECK:  Supple.  Carotid upstrokes are equal bilaterally without bruits.  There is no JVD.  Thyroid is not enlarged.  Trachea is midline.  LUNGS:  Clear to auscultation.  HEART:  Reveals a poorly appreciated PMI, is normal S1, S2.  ABDOMEN:  Protuberant with good bowel sounds.  There is no midline  tenderness.  There is no midline bruits.  EXTREMITIES:  Reveals good pulses bilaterally.  There is no edema.  NEURO:  Exam is intact.   ASSESSMENT/PLAN:  We had a long talk with Mr. Binion today.  I have  reinforced to him not smoking and watching his diet.  Will follow up  lipids in 4 weeks on Simvastatin which is a new  drug.  Will try to keep  everything generic to reduce his cost.  He will need to be on Plavix for  at least a year.   I will plan on seeing him back again in a couple of months.     Thomas C. Daleen Squibb, MD, [facility]  Electronically Signed    TCW/MedQ  DD: 08/17/2006  DT: 08/17/2006  Job #: [id number]   cc:   [name]

## 2010-09-27 NOTE — Cardiovascular Report (Signed)
NAMECHASE, ARNALL                 ACCOUNT NO.:  [id number]   MEDICAL RECORD NO.:  [id number]          PATIENT TYPE:  INP   LOCATION:  1823                         FACILITY:  [facility]   PHYSICIAN:  Veverly Fells. Excell Seltzer, MD  DATE OF BIRTH:  October 11, 1955   DATE OF PROCEDURE:  08/06/2006  DATE OF DISCHARGE:                            CARDIAC CATHETERIZATION   PROCEDURE:  Left heart catheterization, selective coronary angiography,  left ventricular angiography, PTCA and drug-eluting stent placement in  the left circumflex and OM2.   INDICATIONS:  Mr. [name] is a 55 year old gentleman who presented with a  non-ST MI.  He had typical symptoms of an acute coronary syndrome and  was treated with Lovenox and Integralin.  He was referred for urgent  cardiac catheterization.   The risks and indication of the procedure were explained to the patient.  Informed consent was obtained.  The right groin was prepped, draped and  anesthetized with 1% lidocaine using a front wall puncture.  A 6-French  sheath was placed in the right femoral artery.  Standard catheters were  used to image the left and right coronary arteries.  An angled pigtail  catheter was inserted into the left ventricle, and pressures were  recorded.  A 30-degree RAO left ventriculogram was performed.  A  pullback across the aortic valve was done.   At the conclusion of the diagnostic procedure, the patient was found to  have his tight stenosis in the second OM branch of the left circumflex  and moderate stenosis of the mid left circumflex.  I elected to treat  those areas percutaneously.  He had received full-dose Lovenox recently,  and his Integralin was continued.  An XB4 guide catheter was inserted,  and a cougar guidewire was inserted beyond the tight stenosis.  The  second OM branch was pre-dilated with a 2.0 x 15-mm Maverick balloon up  to 6 atmospheres.  A 2.5 x 24-mm Taxus stent was placed in that vessel  and deployed at 14  atmospheres.  The stent was well expanded, but I  elected to post dilate it with a Quantum Maverick 2.75 x 20-mm balloon,  up to 16 atmospheres.  At the conclusion of the OM intervention, the  stent was well expanded, and there was TIMI III flow in the vessel.   At that point, attention was turned to the mid-circumflex.  There was a  70% stenosis, just beyond the first OM branch.  That area was direct  stented with 3.5 x 16-mm Taxus stent which was deployed at 16  atmospheres.  It was then post-dilated with 3.75 x 12-mm Quantum  Maverick balloon, up to 16 atmospheres on two serial inflations.  At the  conclusion of the procedure, both stents were well expanded, and there  was TIMI III flow through the entire circumflex system.   The right femoral arteriotomy was closed with a Star close device.  The  patient tolerated procedure well and had no immediate complications.   FINDINGS:  Aortic pressure 156/98 with a mean of 123, left ventricular  pressure 155/8 with an end-diastolic pressure  of 13.   The left mainstem is angiographically normal.  It bifurcates into the  LAD and left circumflex.   The LAD is large-caliber vessel that courses down to the LV apex.  The  LAD gives off  two small diseased diagonal branches from its most  proximal portion.  There is a medium-size diagonal branch from the mid-  portion of the vessel that is actually the third diagonal.  The proximal  LAD has area of 30% smooth stenosis.  There is no other significant  angiographic disease in the LAD or the diagonal branches.   The left circumflex is dominant.  It is a large caliber throughout the  proximal vessel.  Just beyond the first OM and the mid-circumflex, there  is a focal 70% stenosis that is irregular.  It then courses down and  gives off a second OM branch.  The second OM branch has a 99% stenosis  that is likely the patient's culprit vessel.  Beyond the area of the  stenosis, the vessel bifurcates  into twin vessels.  The left circumflex  courses down the distal AV groove and gives off a left posterior  descending branch.  Just beyond the second OM,  there is a 50% stenosis.   The right coronary artery is occluded in the mid-portion.  The distal  portion of the vessel is collateralized both from left-to-right  collaterals, as well as right-to-right collaterals.   Left ventricular function assessed by 30-degree RAO.  Left  ventriculogram shows mid-inferior wall hypokinesis with overall  preserved left ventricular ejection fraction of 55%.   ASSESSMENT:  1. 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  2. Occluded non-dominant right coronary artery.  3. Non-obstructive LAD disease.  4. Mild segmental left ventricular dysfunction with overall preserved      left ventricular ejection fraction.   DISCUSSION:  As as detailed above.  PCI with drug-eluting stents were  was performed in the second OM branch, as well as the mid-circumflex.  The patient tolerated the procedure well with no residual stenosis, and  TIMI III flow at the conclusion of the case.  He was given 600 mg of  clopidogrel in the cath lab.  He should continue on aspirin and  clopidogrel in combination for a minimum of 12 months.  He will receive  18 hours of Integralin and is transferred to the PCU in stable  condition.      Veverly Fells. Excell Seltzer, MD  Electronically Signed     MDC/MEDQ  D:  08/06/2006  T:  08/06/2006  Job:  [id number]

## 2010-10-03 ENCOUNTER — Other Ambulatory Visit: Payer: Self-pay | Admitting: Internal Medicine

## 2010-10-16 ENCOUNTER — Ambulatory Visit: Payer: 59 | Admitting: Internal Medicine

## 2010-10-18 ENCOUNTER — Encounter: Payer: Self-pay | Admitting: Cardiology

## 2010-10-22 ENCOUNTER — Encounter: Payer: Self-pay | Admitting: Internal Medicine

## 2010-10-22 ENCOUNTER — Ambulatory Visit (INDEPENDENT_AMBULATORY_CARE_PROVIDER_SITE_OTHER): Payer: 59 | Admitting: Internal Medicine

## 2010-10-22 VITALS — BP 126/78 | HR 74 | Temp 97.5°F | Ht 70.0 in | Wt 250.4 lb

## 2010-10-22 DIAGNOSIS — J449 Chronic obstructive pulmonary disease, unspecified: Secondary | ICD-10-CM

## 2010-10-22 DIAGNOSIS — R0602 Shortness of breath: Secondary | ICD-10-CM

## 2010-10-22 DIAGNOSIS — G548 Other nerve root and plexus disorders: Secondary | ICD-10-CM

## 2010-10-22 DIAGNOSIS — M792 Neuralgia and neuritis, unspecified: Secondary | ICD-10-CM

## 2010-10-22 MED ORDER — IPRATROPIUM BROMIDE HFA 17 MCG/ACT IN AERS: 2.0000 | INHALATION_SPRAY | Freq: Two times a day (BID) | RESPIRATORY_TRACT | Status: DC

## 2010-10-22 MED ORDER — GABAPENTIN 100 MG PO CAPS: 100.0000 mg | ORAL_CAPSULE | Freq: Three times a day (TID) | ORAL | Status: DC

## 2010-10-22 NOTE — Progress Notes (Signed)
Subjective:    Patient ID: [name], male    DOB: [birth year], 55 y.o.   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 Refused/noncompliant  mdi Rx Ex-smoker - quit may 2011 Class 2-3 dyspnea but pre-op VO2 max 103ml/kg/min - CPST 02/01/2010 RUL PET Hot interemediate prob nodule, ENB non-diagnostic 9/28/20111. Culture: mycobacterium kansasii. Not on drug Rx Rt incisional neuropathic pain - since dec 2011  OV 10/22/2010: 4 month followup. Sleeping easy but ESS score is only 3. Admits to weight gain. Thinking of applying for disability; wants to talk to Dr Shirlee Latch cardiologist about it. Main issue is that he is more dyspneice since last visit. STates progressive dyspnea since surgery. Brought on with exertion at work like loading printer set or doing vaccuum in house. Relieved by rest and prn albuterol. Not using the atrovent I advised although in past it helped him. HE is not keen on MDI Rx. Also, with pain in incisional site. Presents almost sinc surgery. On ad off  But worse past 2 months. Some days worse than others. [employer] paper wat work makes it worse. STopping work helps pain. He is open to taking medications for same   Review of Systems  Constitutional: Negative for fever and unexpected weight change.  HENT: Negative for ear pain, nosebleeds, congestion, sore throat, rhinorrhea, sneezing, trouble swallowing, dental problem, postnasal drip and sinus pressure.   Eyes: Negative for redness and itching.  Respiratory: Positive for shortness of breath. Negative for cough, chest tightness and wheezing.   Cardiovascular: Positive for chest pain. Negative for palpitations and leg swelling.  Gastrointestinal: Negative for nausea and vomiting.  Genitourinary: Negative for dysuria.  Musculoskeletal: Negative for joint swelling.  Skin: Negative for rash.  Neurological: Negative for headaches.  Hematological: Does not bruise/bleed easily.    Psychiatric/Behavioral: Negative for dysphoric mood. The patient is not nervous/anxious.        Objective:   Physical Exam  Nursing note and vitals reviewed. Constitutional: He is oriented to person, place, and time. He appears well-developed and well-nourished. No distress.       obese  HENT:  Head: Normocephalic and atraumatic.  Right Ear: External ear normal.  Left Ear: External ear normal.  Mouth/Throat: Oropharynx is clear and moist. No oropharyngeal exudate.  Eyes: Conjunctivae and EOM are normal. Pupils are equal, round, and reactive to light. Right eye exhibits no discharge. Left eye exhibits no discharge. No scleral icterus.  Neck: Normal range of motion. Neck supple. No JVD present. No tracheal deviation present. No thyromegaly present.  Cardiovascular: Normal rate, regular rhythm and intact distal pulses.  Exam reveals no gallop and no friction rub.   No murmur heard. Pulmonary/Chest: Effort normal and breath sounds normal. No respiratory distress. He has no wheezes. He has no rales. He exhibits no tenderness.       Tender in areas of scar of prior surgery  Abdominal: Soft. Bowel sounds are normal. He exhibits no distension and no mass. There is no tenderness. There is no rebound and no guarding.  Musculoskeletal: Normal range of motion. He exhibits no edema and no tenderness.  Lymphadenopathy:    He has no cervical adenopathy.  Neurological: He is alert and oriented to person, place, and time. He has normal reflexes. No cranial nerve deficit. Coordination normal.  Skin: Skin is warm and dry. No rash noted. He is not diaphoretic. No erythema. No pallor.  Psychiatric: He has a normal mood  and affect. His behavior is normal. Judgment and thought content normal.          Assessment & Plan:

## 2010-10-22 NOTE — Patient Instructions (Signed)
#  Shortness of breath and copd  - this is due to untreated copd, weight, lack of fitness, and maybe heart issues  - you SHOULD take your atrovent inhaler 2 puff twice daily  - Beyond this we can consider research study or adding a low dose inhaled steroids   - I will refer you to pulmonary rehab (nurse to do order) #possible sleep apnea  - will administer sleep questionnaire now  - depending on the results we will call you to discuss sleep study evaluation #Pain  - this is probably related to surgical site pain and exertional pull with loading at work  - if you are interested we can try generic gapapentin 100mg  three times daily (can make you sleep) #Followup   - 6 weeks

## 2010-10-22 NOTE — Assessment & Plan Note (Signed)
Start neurontin at low dose Will reassess at fu and decide on dose escalation

## 2010-10-22 NOTE — Assessment & Plan Note (Signed)
#  Shortness of breath and copd  - this is due to untreated copd, weight, lack of fitness, and maybe heart issues  - you SHOULD take your atrovent inhaler 2 puff twice daily - i counseled extensively on importance of complaince  - Beyond this we can consider research study or adding a low dose inhaled steroids but it depends on how do with atrovent inhaler  - I will refer you to pulmonary rehab (nurse to do order)

## 2010-10-25 ENCOUNTER — Ambulatory Visit (INDEPENDENT_AMBULATORY_CARE_PROVIDER_SITE_OTHER): Payer: 59 | Admitting: Cardiology

## 2010-10-25 ENCOUNTER — Encounter: Payer: Self-pay | Admitting: *Deleted

## 2010-10-25 ENCOUNTER — Encounter: Payer: Self-pay | Admitting: Cardiology

## 2010-10-25 DIAGNOSIS — E785 Hyperlipidemia, unspecified: Secondary | ICD-10-CM

## 2010-10-25 DIAGNOSIS — I2510 Atherosclerotic heart disease of native coronary artery without angina pectoris: Secondary | ICD-10-CM

## 2010-10-25 DIAGNOSIS — I519 Heart disease, unspecified: Secondary | ICD-10-CM

## 2010-10-25 DIAGNOSIS — R0602 Shortness of breath: Secondary | ICD-10-CM

## 2010-10-25 DIAGNOSIS — I10 Essential (primary) hypertension: Secondary | ICD-10-CM

## 2010-10-25 MED ORDER — ASPIRIN 81 MG PO TABS: 81.0000 mg | ORAL_TABLET | Freq: Every day | ORAL | Status: DC

## 2010-10-25 NOTE — Patient Instructions (Signed)
Your physician has recommended you make the following change in your medication: START Aspirin 81mg  daily.  Your physician has requested that you have an echocardiogram. Echocardiography is a painless test that uses sound waves to create images of your heart. It provides your doctor with information about the size and shape of your heart and how well your heart's chambers and valves are working. This procedure takes approximately one hour. There are no restrictions for this procedure.  Your physician recommends that you return for a FASTING lipid profile: (Lipid/lft/BMP/BNP)  Your physician recommends that you schedule a follow-up appointment in: 6 months

## 2010-10-27 NOTE — Assessment & Plan Note (Addendum)
BP is under good control.   I told him that I would write him a letter for disability.  He also may need depression treatment.

## 2010-10-27 NOTE — Assessment & Plan Note (Signed)
Check lipids/LFTs, goal LDL < 70.  

## 2010-10-27 NOTE — Assessment & Plan Note (Addendum)
No chest pain.  I will have him continue his current medications, including ASA, Plavix, ACEI, beta blocker, and statin.  Given his multiple stents, I will have him stay on Plavix long-term as long as he does not have problems with it.  He is getting it at generic pricing.  OK to decrease ASA to 81 mg daily.

## 2010-10-27 NOTE — Assessment & Plan Note (Signed)
I think COPD is the major cause of his exertional dyspnea.  However, I will get an echo to make sure that LV systolic function is preserved and will check a BNP with labs.  He is following up with Dr. Marchelle Gearing for pulmonology.

## 2010-10-27 NOTE — Progress Notes (Signed)
PCP: Dr. Sherrie Mustache  55 yo with h/o HTN, DM, hyperlipidemia, and smoking who developed NSTEMI in 6/10 returns for followup.  At that time, he was found to have a totally occluded mid RCA (known from prior studies) and a tight distal  CFX stenosis.  A drug-eluting stent was placed in the distal CFX, which was though to be the infarct-related lesion.  EF was preserved on left ventriculogram.  Patient had a lung mass removed last year.  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  He has COPD. Since the surgery, he has been more short of breath.  He has to carry up to 50 lb loads at work and is getting very dyspneic with this.  He is short of breath climbing a flight of steps.  Housework like mopping and vacuuming get him short of breath.  No orthopnea or PND.   No chest pain with exertion.  He still gets some right-sided surgical site pain.  He has been prescribed Neurontin to try to help this.  He is thinking about filing for disability.  BP is under control.  He is not smoking.  His mood is poor today.  He is very concerned about finances and about whether he can physically continue working.   Labs (6/10):  Creatinine 1.48, K normal Labs (5/11): K 4, creatinine 1.6, LDL 53, HDL 30  Allergies (verified):  1)  ! Pcn  Family History: Family history of stroke.   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  Single  Tobacco Use - Former smoker.  Quit in May 2011.  1ppd x 38 yrs Alcohol Use - yes Regular Exercise - yes Drug Use - no  Past Medical History: 1.  CAD:  PCI 3/08 to OM2 and mid CFX.  NSTEMI 6/10.  Prior stents patent.  90% distal CFX, totally occluded mRCA (old) with collaterals.  EF 55% on LV-gram.  Xience DES 2.5 x 23 mm to distal CFX.  2.  DM2 3.  Hyperlipidemia 4.  COPD:   - quit tobacco 5/11.   - Gold Stage 3 with asthma component - Fev1 1.53L/54%, 14% fev1 BD response, DLCO `6/54%- July  2011  - MM genotyple  01/07/2010  - unable to afford spiriva and unwilling to try ics due to prior renal failure: stated to Dr. Marchelle Gearing - Aug 2011 5.  CKD, last creatinine 1.6 6.  obesity 7..  History of CVA without residual deficits 8.  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  -  Rx for CAP end May 2011  - Persistent and PET positive - Aug 2011  - ENB Bx 02/06/2010 - indeterminate  - Onciummne lung cancer antigen panel - Negative  03/01/2010 (test of poor sensitivity)    - Culture with Mycobacterium kansasii   ROS: All systems reviewed and negative except as per HPI.   Current Outpatient Prescriptions  Medication Sig Dispense Refill  . atorvastatin (LIPITOR) 40 MG tablet Take 40 mg by mouth at bedtime.        . clopidogrel (PLAVIX) 75 MG tablet Take 75 mg by mouth daily.        Marland Kitchen gabapentin (NEURONTIN) 100 MG capsule Take 1 capsule (100 mg total) by mouth 3 (three) times daily.  90 capsule  0  . glipiZIDE (GLUCOTROL) 10 MG tablet Take 10 mg by mouth 2 (two) times daily  before a meal.        . hydrochlorothiazide (,MICROZIDE/HYDRODIURIL,) 12.5 MG capsule Take 12.5 mg by mouth daily.        Marland Kitchen ipratropium (ATROVENT HFA) 17 MCG/ACT inhaler Inhale 2 puffs into the lungs 2 (two) times daily.  1 Inhaler  3  . metoprolol tartrate (LOPRESSOR) 25 MG tablet TAKE ONE TABLET BY MOUTH TWICE DAILY  30 tablet  5  . nitroGLYCERIN (NITROSTAT) 0.4 MG SL tablet Place 0.4 mg under the tongue every 5 (five) minutes as needed. May repeat for up to 3 doses.       Marland Kitchen olmesartan (BENICAR) 20 MG tablet Take 20 mg by mouth daily.        . VENTOLIN HFA 108 (90 BASE) MCG/ACT inhaler INHALE ONE OR TWO PUFFS EVERY FOUR TO SIX HOURS AS NEEDED  1 Inhaler  3  . aspirin 81 MG tablet Take 1 tablet (81 mg total) by mouth daily.  30 tablet  0    BP 112/72  Pulse 82  Ht 5\' 10"  (1.778 m)  Wt 248 lb (112.492 kg)  BMI 35.58 kg/m2 General:  Well developed, well  nourished, in no acute distress.  Obese.  Neck:  Neck thick, no JVD. No masses, thyromegaly or abnormal cervical nodes. Lungs:  Clear bilaterally to auscultation and percussion. Heart:  Non-displaced PMI, chest non-tender; regular rate and rhythm, S1, S2 without murmurs, rubs or gallops. Carotid upstroke normal, no bruit. Pedals normal pulses. No edema, no varicosities. Abdomen:  Bowel sounds positive; abdomen soft and non-tender without masses, organomegaly, or hernias noted. No hepatosplenomegaly. Extremities:  No clubbing or cyanosis. Neurologic:  Alert and oriented x 3. Psych:  Depressed affect.

## 2010-11-04 ENCOUNTER — Other Ambulatory Visit: Payer: Self-pay | Admitting: *Deleted

## 2010-11-04 MED ORDER — CLOPIDOGREL BISULFATE 75 MG PO TABS: 75.0000 mg | ORAL_TABLET | Freq: Every day | ORAL | Status: DC

## 2010-11-05 ENCOUNTER — Other Ambulatory Visit: Payer: Self-pay | Admitting: Emergency Medicine

## 2010-11-05 MED ORDER — CLOPIDOGREL BISULFATE 75 MG PO TABS: 75.0000 mg | ORAL_TABLET | Freq: Every day | ORAL | Status: DC

## 2010-11-07 ENCOUNTER — Other Ambulatory Visit: Payer: 59 | Admitting: *Deleted

## 2010-11-12 ENCOUNTER — Other Ambulatory Visit (INDEPENDENT_AMBULATORY_CARE_PROVIDER_SITE_OTHER): Payer: 59 | Admitting: *Deleted

## 2010-11-12 DIAGNOSIS — R0602 Shortness of breath: Secondary | ICD-10-CM

## 2010-11-12 DIAGNOSIS — I519 Heart disease, unspecified: Secondary | ICD-10-CM

## 2010-11-18 ENCOUNTER — Encounter: Payer: Self-pay | Admitting: Internal Medicine

## 2010-11-21 ENCOUNTER — Telehealth: Payer: Self-pay | Admitting: Internal Medicine

## 2010-11-21 MED ORDER — GABAPENTIN 100 MG PO CAPS: 100.0000 mg | ORAL_CAPSULE | Freq: Three times a day (TID) | ORAL | Status: DC

## 2010-11-21 NOTE — Telephone Encounter (Signed)
Spoke with pt. He states that he is still waiting on MR to write letter for disabilty for him. I explained that MR is out of the office and has been out of country this month. I advised he returns to the office on 12/03/10 and will forward this msg to him. Please advise thanks

## 2010-11-21 NOTE — Telephone Encounter (Signed)
done

## 2010-11-25 ENCOUNTER — Other Ambulatory Visit: Payer: 59 | Admitting: *Deleted

## 2010-11-26 NOTE — Telephone Encounter (Signed)
My last ov 6/12 he told me he was thinking about applying for disability. I told him I would support him. He told me he needed to talk to Dr. Shirlee Latch about it. On 6/15 when he saw Dr Shirlee Latch, DR Shirlee Latch told him that Dr. Shirlee Latch would write a letter for him. What is the status on that ?  I can write a letter too but a) I never heard back from him after his discussion with Dr. Shirlee Latch if he wanted a letter from me' b) is the process for disability - does he not provide me or healthport the forms to fill?  MR

## 2010-11-27 NOTE — Telephone Encounter (Signed)
[email or web address].Jennifer Castillo, CMA  

## 2010-11-28 NOTE — Telephone Encounter (Signed)
lmtcb

## 2010-12-02 NOTE — Telephone Encounter (Signed)
Called and spoke with pt. Informed him of MR's response.  Pt states Dr. Shirlee Latch has already dictated a letter for pt (see under the letters tab in chart review  for complete details)  and mailed letter to his home address.  Pt is requesting MR do the same.  Will forward message back to MR so he is aware.

## 2010-12-03 ENCOUNTER — Ambulatory Visit: Payer: 59 | Admitting: Internal Medicine

## 2010-12-09 ENCOUNTER — Other Ambulatory Visit: Payer: 59 | Admitting: *Deleted

## 2010-12-09 NOTE — Telephone Encounter (Signed)
MR, please advise if this has been taken care of for patient. Thanks.

## 2010-12-10 ENCOUNTER — Other Ambulatory Visit (INDEPENDENT_AMBULATORY_CARE_PROVIDER_SITE_OTHER): Payer: 59 | Admitting: *Deleted

## 2010-12-10 DIAGNOSIS — E785 Hyperlipidemia, unspecified: Secondary | ICD-10-CM

## 2010-12-10 DIAGNOSIS — R0602 Shortness of breath: Secondary | ICD-10-CM

## 2010-12-10 DIAGNOSIS — I2510 Atherosclerotic heart disease of native coronary artery without angina pectoris: Secondary | ICD-10-CM

## 2010-12-10 LAB — HEPATIC FUNCTION PANEL
Bilirubin, Direct: 0.1 mg/dL (ref 0.0–0.3)
Indirect Bilirubin: 0.3 mg/dL (ref 0.0–0.9)
Total Bilirubin: 0.4 mg/dL (ref 0.3–1.2)

## 2010-12-10 LAB — LIPID PANEL
LDL Cholesterol: 34 mg/dL (ref 0–99)
Total CHOL/HDL Ratio: 4.5 Ratio
VLDL: 40 mg/dL (ref 0–40)

## 2010-12-10 LAB — BASIC METABOLIC PANEL
BUN: 29 mg/dL — ABNORMAL HIGH (ref 6–23)
Chloride: 104 mEq/L (ref 96–112)
Glucose, Bld: 287 mg/dL — ABNORMAL HIGH (ref 70–99)
Potassium: 4.2 mEq/L (ref 3.5–5.3)
Sodium: 137 mEq/L (ref 135–145)

## 2010-12-12 NOTE — Telephone Encounter (Signed)
[name], can you do a similar leetter to Dr. Shirlee Latch please for his disability app. Thanks. MR

## 2010-12-16 ENCOUNTER — Other Ambulatory Visit: Payer: Self-pay | Admitting: Cardiology

## 2010-12-19 ENCOUNTER — Telehealth: Payer: Self-pay | Admitting: Internal Medicine

## 2010-12-19 MED ORDER — ALBUTEROL SULFATE HFA 108 (90 BASE) MCG/ACT IN AERS: 2.0000 | INHALATION_SPRAY | Freq: Four times a day (QID) | RESPIRATORY_TRACT | Status: DC | PRN

## 2010-12-19 NOTE — Telephone Encounter (Signed)
Refill has been sent to the pharmacy.  

## 2010-12-20 NOTE — Telephone Encounter (Signed)
Victorino Dike, Have you already taken care of this? Thanks.

## 2010-12-26 NOTE — Telephone Encounter (Signed)
Letter mailed to the pt. Pt aware. [name], CMA

## 2010-12-26 NOTE — Telephone Encounter (Signed)
I did it and routed to [facility]

## 2011-01-15 ENCOUNTER — Other Ambulatory Visit: Payer: Self-pay | Admitting: Cardiology

## 2011-01-16 ENCOUNTER — Other Ambulatory Visit: Payer: Self-pay | Admitting: *Deleted

## 2011-02-16 ENCOUNTER — Other Ambulatory Visit: Payer: Self-pay | Admitting: Cardiology

## 2011-03-27 ENCOUNTER — Telehealth: Payer: Self-pay | Admitting: Internal Medicine

## 2011-03-27 NOTE — Telephone Encounter (Signed)
Spoke with pharmacist at [facility]. He states that the pt will need refills on rescue inhaler, just had this filled yesterday and no refills remaining. I advised that pt will need to come in for appt before we give any refills since he is overdue for followup. Pharmacist verbalized understanding. I called and spoke with pt and notified needs ov and sched him for 04-22-11 at 10:15 am.

## 2011-03-31 ENCOUNTER — Other Ambulatory Visit: Payer: Self-pay | Admitting: Cardiology

## 2011-04-01 ENCOUNTER — Encounter: Payer: Self-pay | Admitting: Internal Medicine

## 2011-04-01 ENCOUNTER — Ambulatory Visit (INDEPENDENT_AMBULATORY_CARE_PROVIDER_SITE_OTHER): Payer: 59 | Admitting: Internal Medicine

## 2011-04-01 VITALS — BP 130/98 | HR 86 | Temp 98.0°F | Ht 68.5 in | Wt 245.4 lb

## 2011-04-01 DIAGNOSIS — R0602 Shortness of breath: Secondary | ICD-10-CM

## 2011-04-01 DIAGNOSIS — G548 Other nerve root and plexus disorders: Secondary | ICD-10-CM

## 2011-04-01 DIAGNOSIS — J441 Chronic obstructive pulmonary disease with (acute) exacerbation: Secondary | ICD-10-CM

## 2011-04-01 DIAGNOSIS — R0789 Other chest pain: Secondary | ICD-10-CM

## 2011-04-01 DIAGNOSIS — M792 Neuralgia and neuritis, unspecified: Secondary | ICD-10-CM

## 2011-04-01 MED ORDER — PREDNISONE 10 MG PO TABS: ORAL_TABLET | ORAL | Status: DC

## 2011-04-01 MED ORDER — ALBUTEROL SULFATE (2.5 MG/3ML) 0.083% IN NEBU
2.5000 mg | INHALATION_SOLUTION | Freq: Once | RESPIRATORY_TRACT | Status: AC
  Administered 2011-04-01: 2.5 mg via RESPIRATORY_TRACT

## 2011-04-01 NOTE — Patient Instructions (Signed)
I think you are in the middle of a copd/asthma attack and been living like this for a while; this is likely due to not being on optimal medications I think the pain and shortness of breath are related to this It is important that despite costs that you agree to be on optimal medications.  We discussed options of nebulizers (pulmicort, atrovent, brovana), 90 day supply on spiriva and symbicort or monthly supply of same; please find out from insurance the costs for these For now, today take a nebulizer Rx in office Please take Take prednisone 40mg once daily x 3 days, then 30mg once daily x 3 days, then 20mg once daily x 3 days, then prednisone 10mg once daily  x 3 days and stop Start symbicort 160/4.5 2 puff bid - take sample, script and show technique Start spiriva 1 puff daily -  take sample, script and show technique Return in 2 weeks with spirometry at followup to see me or my NP to review symptoms of pain and shortness of breath Continue your other medications   

## 2011-04-01 NOTE — Assessment & Plan Note (Signed)
Partly controlled by neurontin but I think active copd is cotnributing to a musculoskeletal pain. Wil monitor this with Rx of AECOPD and see how response is

## 2011-04-01 NOTE — Progress Notes (Signed)
Subjective:    Patient ID: [name], male    DOB: 12-07-1955, 55 y.o.   MRN: [id number]  HPI 1. 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 Refused/noncompliant  mdi Rx 2. Ex-smoker - quit may 2011 3. lass 2-3 dyspnea but pre-op VO2 max 51ml/kg/min - CPST 02/01/2010 RUL PET Hot interemediate prob nodule, ENB non-diagnostic 9/28/20111. Culture: mycobacterium kansasii. Not on drug Rx 4. 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 Sleeping easy but ESS score is only 3. Admits to weight gain. Thinking of applying for disability; wants to talk to Dr Shirlee Latch cardiologist about it. Main issue is that he is more dyspneice since last visit. STates progressive dyspnea since surgery. Brought on with exertion at work like loading printer set or doing vaccuum in house. Relieved by rest and prn albuterol. Not using the atrovent I advised although in past it helped him. HE is not keen on MDI Rx. Also, with pain in incisional site. Presents almost sinc surgery. On ad off  But worse past 2 months. Some days worse than others. [employer] paper wat work makes it worse. STopping work helps pain. He is open to taking medications for same   #Shortness of breath and copd  - this is due to untreated copd, weight, lack of fitness, and maybe heart issues  - you SHOULD take your atrovent inhaler 2 puff twice daily  - Beyond this we can consider research study or adding a low dose inhaled steroids  - I will refer you to pulmonary rehab (nurse to do order)  #possible sleep apnea  - will administer sleep questionnaire now  - depending on the results we will call you to discuss sleep study evaluation  #Pain  - this is probably related to surgical site pain and exertional pull with loading at work  - if you are interested we can try generic gapapentin 100mg  three times daily (can make you sleep)  #Followup  - 6 weeks  OV 04/01/2011  Followup for above issues. Reports worsening of pain. Infrascapular location - same site as post surgical and present since surgey. Also, incisional site pain +. The pain that is worse is the infrascapular pain which is constant, worse, and made worse by exertion, and carrying heavy things. At 1 flight of stairs would stop due to pain that he thinks is moderate-severe. Pain is more dominant than dyspnea. Incisional site pain is less and only random compared to infrascapular pain though both started after surgery.  He is taking gabapentin low dose but is only helping some.  Pain is sometimes only associated with dyspnea though dyspnea too occurs for same level of exertion. He states he is now more compliant with atrovent and taking it only few times a day. Denies fever, cough, weight loss or gain, chills, nausea, vomit, leg edema, hemoptysis. His sprioemtry today shows significant worsening of Fev1 to 1/34L/37%, Ratio 49 and c/w SEVERE OBSTRUCTION. Walking desaturation test shows: dyspnea at 3 laps with HR 122 but did not desaturate below 91%.   Social: now working for [employer]. 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 Still off cigs. Not yet applied for disability; trying to avoid.   No change in family or past medical hx.       Review of Systems  Constitutional: Negative for fever and unexpected weight change.  HENT: Negative for ear pain, nosebleeds, congestion, sore  throat, rhinorrhea, sneezing, trouble swallowing, dental problem, postnasal drip and sinus pressure.   Eyes: Negative for redness and itching.  Respiratory: Positive for cough and shortness of breath. Negative for chest tightness and wheezing.   Cardiovascular: Negative for palpitations and leg swelling.  Gastrointestinal: Negative for nausea and vomiting.  Genitourinary: Negative for dysuria.  Musculoskeletal: Negative for joint swelling.  Skin: Negative for rash.  Neurological: Negative for headaches.  Hematological: Does  not bruise/bleed easily.  Psychiatric/Behavioral: Negative for dysphoric mood. The patient is not nervous/anxious.        Objective:   Physical Exam Nursing note and vitals reviewed. Constitutional: He is oriented to person, place, and time. He appears well-developed and well-nourished. No distress.       obese  HENT:  Head: Normocephalic and atraumatic.  Right Ear: External ear normal.  Left Ear: External ear normal.  Mouth/Throat: Oropharynx is clear and moist. No oropharyngeal exudate.  Eyes: Conjunctivae and EOM are normal. Pupils are equal, round, and reactive to light. Right eye exhibits no discharge. Left eye exhibits no discharge. No scleral icterus.  Neck: Normal range of motion. Neck supple. No JVD present. No tracheal deviation present. No thyromegaly present.  Cardiovascular: Normal rate, regular rhythm and intact distal pulses.  Exam reveals no gallop and no friction rub.   No murmur heard. Pulmonary/Chest: Effort normal and breath sounds normal. No respiratory distress. He has no wheezes. He has no rales. He exhibits no tenderness.       Tender in areas of scar of prior surgery and also rt infrascapular area has costochondral tenderness Abdominal: Soft. Bowel sounds are normal. He exhibits no distension and no mass. There is no tenderness. There is no rebound and no guarding.  Musculoskeletal: Normal range of motion. He exhibits no edema and no tenderness.  Lymphadenopathy:    He has no cervical adenopathy.  Neurological: He is alert and oriented to person, place, and time. He has normal reflexes. No cranial nerve deficit. Coordination normal.  Skin: Skin is warm and dry. No rash noted. He is not diaphoretic. No erythema. No pallor.  Psychiatric: FLAT AFFECT His behavior is normal. Judgment and thought content normal.             Assessment & Plan:

## 2011-04-01 NOTE — Assessment & Plan Note (Signed)
I think you are in the middle of a copd/asthma attack and been living like this for a while; this is likely due to not being on optimal medications I think the pain and shortness of breath are related to this It is important that despite costs that you agree to be on optimal medications.  We discussed options of nebulizers (pulmicort, atrovent, brovana), 90 day supply on spiriva and symbicort or monthly supply of same; please find out from insurance the costs for these For now, today take a nebulizer Rx in office Please take Take prednisone 40mg  once daily x 3 days, then 30mg  once daily x 3 days, then 20mg  once daily x 3 days, then prednisone 10mg  once daily  x 3 days and stop Start symbicort 160/4.5 2 puff bid - take sample, script and show technique Start spiriva 1 puff daily -  take sample, script and show technique Return in 2 weeks with spirometry at followup to see me or my NP to review symptoms of pain and shortness of breath Continue your other medications

## 2011-04-09 ENCOUNTER — Telehealth: Payer: Self-pay | Admitting: Internal Medicine

## 2011-04-09 MED ORDER — IPRATROPIUM BROMIDE HFA 17 MCG/ACT IN AERS: 2.0000 | INHALATION_SPRAY | Freq: Two times a day (BID) | RESPIRATORY_TRACT | Status: DC

## 2011-04-09 NOTE — Telephone Encounter (Signed)
I spoke with pt and he states he needed his atrovent inhaler sent to [facility] in [place]. I advised pt will send rx and needed nothing further

## 2011-04-10 ENCOUNTER — Telehealth: Payer: Self-pay | Admitting: Internal Medicine

## 2011-04-10 MED ORDER — ALBUTEROL SULFATE HFA 108 (90 BASE) MCG/ACT IN AERS: 2.0000 | INHALATION_SPRAY | Freq: Four times a day (QID) | RESPIRATORY_TRACT | Status: DC | PRN

## 2011-04-10 NOTE — Telephone Encounter (Signed)
Called and spoke with pt. Pt states rx was sent yesterday for the wrong med.  He states he needed ventolin sent in, not atrovent.  rx sent for ventolin.  Pt aware.

## 2011-04-22 ENCOUNTER — Ambulatory Visit: Payer: 59 | Admitting: Internal Medicine

## 2011-04-22 ENCOUNTER — Encounter: Payer: Self-pay | Admitting: Internal Medicine

## 2011-04-22 ENCOUNTER — Ambulatory Visit (INDEPENDENT_AMBULATORY_CARE_PROVIDER_SITE_OTHER): Payer: 59 | Admitting: Internal Medicine

## 2011-04-22 DIAGNOSIS — G548 Other nerve root and plexus disorders: Secondary | ICD-10-CM

## 2011-04-22 DIAGNOSIS — M792 Neuralgia and neuritis, unspecified: Secondary | ICD-10-CM

## 2011-04-22 DIAGNOSIS — J449 Chronic obstructive pulmonary disease, unspecified: Secondary | ICD-10-CM | POA: Insufficient documentation

## 2011-04-22 MED ORDER — BUDESONIDE-FORMOTEROL FUMARATE 160-4.5 MCG/ACT IN AERO: 2.0000 | INHALATION_SPRAY | Freq: Two times a day (BID) | RESPIRATORY_TRACT | Status: DC

## 2011-04-22 NOTE — Assessment & Plan Note (Signed)
Improved  Plan Continue gabapentin

## 2011-04-22 NOTE — Progress Notes (Signed)
Subjective:    Patient ID: [name], male    DOB: 1955-08-30, 55 y.o.   MRN: [id number]  HPI 1. 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 - Clinical worsening to stage 4 Fev1 to 1.34L/37%, Ratio 49 in setting of mdi refusal ($$) - Nov 2012 - No desaturation on submaximal exertion Nov 2012 2. Ex-smoker - quit may 2011 3. lass 2-3 dyspnea but pre-op VO2 max 55ml/kg/min - CPST 02/01/2010 RUL PET Hot interemediate prob nodule, ENB non-diagnostic 9/28/20111. Culture: mycobacterium kansasii. Not on drug Rx 4. 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 Obese with ESS - 3 in June 2012  OV 10/22/2010: 4 month followup. Sleeping easy but ESS score is only 3. Admits to weight gain. Thinking of applying for disability; wants to talk to Dr Shirlee Latch cardiologist about it. Main issue is that he is more dyspneice since last visit. STates progressive dyspnea since surgery. Brought on with exertion at work like loading printer set or doing vaccuum in house. Relieved by rest and prn albuterol. Not using the atrovent I advised although in past it helped him. HE is not keen on MDI Rx. Also, with pain in incisional site. Presents almost sinc surgery. On ad off  But worse past 2 months. Some days worse than others. [employer] paper wat work makes it worse. STopping work helps pain. He is open to taking medications for same   #Shortness of breath and copd  - this is due to untreated copd, weight, lack of fitness, and maybe heart issues  - you SHOULD take your atrovent inhaler 2 puff twice daily  - Beyond this we can consider research study or adding a low dose inhaled steroids  - I will refer you to pulmonary rehab (nurse to do order)  #possible sleep apnea  - will administer sleep questionnaire now  - depending on the results we will call you to discuss sleep study evaluation  #Pain  - this is probably related to surgical site pain and exertional pull  with loading at work  - if you are interested we can try generic gapapentin 100mg  three times daily (can make you sleep)  #Followup  - 6 weeks  OV 04/01/2011 Followup for above issues. Reports worsening of pain. Infrascapular location - same site as post surgical and present since surgey. Also, incisional site pain +. The pain that is worse is the infrascapular pain which is constant, worse, and made worse by exertion, and carrying heavy things. At 1 flight of stairs would stop due to pain that he thinks is moderate-severe. Pain is more dominant than dyspnea. Incisional site pain is less and only random compared to infrascapular pain though both started after surgery.  He is taking gabapentin low dose but is only helping some.  Pain is sometimes only associated with dyspnea though dyspnea too occurs for same level of exertion. He states he is now more compliant with atrovent and taking it only few times a day. Denies fever, cough, weight loss or gain, chills, nausea, vomit, leg edema, hemoptysis. His sprioemtry today shows significant worsening of Fev1 to 1.34L/37%, Ratio 49 and c/w SEVERE OBSTRUCTION. Walking desaturation test shows: dyspnea at 3 laps with HR 122 but did not desaturate below 91%.   Social: now working for [employer]. 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 Still off cigs. Not yet applied for disability; trying to avoid.   No change  in family or past medical hx.   REC  I think you are in the middle of a copd/asthma attack and been living like this for a while; this is likely due to not being on optimal medications I think the pain and shortness of breath are related to this It is important that despite costs that you agree to be on optimal medications.  We discussed options of nebulizers (pulmicort, atrovent, brovana), 90 day supply on spiriva and symbicort or monthly supply of same; please find out from insurance the costs for these For now, today take a nebulizer Rx in  office Please take Take prednisone 40mg  once daily x 3 days, then 30mg  once daily x 3 days, then 20mg  once daily x 3 days, then prednisone 10mg  once daily  x 3 days and stop Start symbicort 160/4.5 2 puff bid - take sample, script and show technique Start spiriva 1 puff daily -  take sample, script and show technique Return in 2 weeks with spirometry at followup to see me or my NP to review symptoms of pain and shortness of breath Continue your other medications  OV 04/22/2011  Followup COPD and neuropathic pain. He is now taking gabapentin and pain score from 8 has dropped to 4. It is tolerable now. In terms of copd, he is not taking spiriva but is fairly compliant with the symbicort samples. With this dyspnea much improved. Feels good. He has had flu shot. However, running out of sample symbicort. He is yet to work out costs. He wants more free samples.  Past, Family, Social: No change    Review of Systems  Constitutional: Negative for fever and unexpected weight change.  HENT: Positive for rhinorrhea. Negative for ear pain, nosebleeds, congestion, sore throat, sneezing, trouble swallowing, dental problem, postnasal drip and sinus pressure.   Eyes: Negative for redness and itching.  Respiratory: Positive for cough and shortness of breath. Negative for chest tightness and wheezing.   Cardiovascular: Positive for palpitations. Negative for leg swelling.  Gastrointestinal: Negative for nausea and vomiting.  Genitourinary: Negative for dysuria.  Musculoskeletal: Negative for joint swelling.  Skin: Negative for rash.  Neurological: Negative for headaches.  Hematological: Bruises/bleeds easily.  Psychiatric/Behavioral: Negative for dysphoric mood. The patient is not nervous/anxious.        Objective:   Physical Exam  Nursing note and vitals reviewed. Constitutional: He is oriented to person, place, and time. He appears well-developed and well-nourished. No distress.       obese    HENT:  Head: Normocephalic and atraumatic.  Right Ear: External ear normal.  Left Ear: External ear normal.  Mouth/Throat: Oropharynx is clear and moist. No oropharyngeal exudate.  Eyes: Conjunctivae and EOM are normal. Pupils are equal, round, and reactive to light. Right eye exhibits no discharge. Left eye exhibits no discharge. No scleral icterus.  Neck: Normal range of motion. Neck supple. No JVD present. No tracheal deviation present. No thyromegaly present.  Cardiovascular: Normal rate, regular rhythm and intact distal pulses.  Exam reveals no gallop and no friction rub.   No murmur heard. Pulmonary/Chest: Effort normal and breath sounds normal. No respiratory distress. He has no wheezes. He has no rales. He exhibits no tenderness.       Tender in areas of scar of prior surgery and also rt infrascapular area has costochondral tenderness but this is much much improved Abdominal: Soft. Bowel sounds are normal. He exhibits no distension and no mass. There is no tenderness. There is no rebound  and no guarding.  Musculoskeletal: Normal range of motion. He exhibits no edema and no tenderness.  Lymphadenopathy:    He has no cervical adenopathy.  Neurological: He is alert and oriented to person, place, and time. He has normal reflexes. No cranial nerve deficit. Coordination normal.  Skin: Skin is warm and dry. No rash noted. He is not diaphoretic. No erythema. No pallor.  Psychiatric: FLAT AFFECT His behavior is normal. Judgment and thought content normal.            Assessment & Plan:

## 2011-04-22 NOTE — Patient Instructions (Addendum)
#  Obstructive lung disease  Glad you are better Continnue symbicort 160/4.5 2 puff bid - take sample, and script; work out with insurance the cost Ok to hold off spiriva #Neuropathic pain - Glad you are better  - Continue gabapentin with as before #Followup - Return in 3 months  weeks with spirometry at followup  - Continue your other medications

## 2011-04-22 NOTE — Assessment & Plan Note (Signed)
Resolved aecopd. REfusing spiriva. Has had flu shot. Will do symbicort samples. Check spiro next ov in 3 months. Asked him to talk to pharmacy and work out cost

## 2011-07-18 ENCOUNTER — Telehealth: Payer: Self-pay | Admitting: Cardiology

## 2011-07-18 ENCOUNTER — Other Ambulatory Visit: Payer: Self-pay | Admitting: Internal Medicine

## 2011-07-18 NOTE — Telephone Encounter (Signed)
Pt calling to see if he needs to stop his blood thinner for dental procedure.

## 2011-07-18 NOTE — Telephone Encounter (Signed)
The patient is aware of Dr. McLean's recommendations. 

## 2011-07-18 NOTE — Telephone Encounter (Signed)
I spoke with the patient. He states his dentist did want him to hold plavix prior to a tooth extraction. He wanted to know from Dr. Shirlee Latch how long he could hold prior to extraction. I explained I will forward this to Dr. Shirlee Latch and Thurston Hole and we will be in touch with him next week.

## 2011-07-18 NOTE — Telephone Encounter (Signed)
Can hold 5 days prior to tooth extraction, then start back afterwards.

## 2011-07-31 ENCOUNTER — Other Ambulatory Visit: Payer: Self-pay | Admitting: Cardiology

## 2011-07-31 ENCOUNTER — Other Ambulatory Visit: Payer: Self-pay

## 2011-07-31 MED ORDER — METOPROLOL TARTRATE 25 MG PO TABS: 25.0000 mg | ORAL_TABLET | Freq: Two times a day (BID) | ORAL | Status: DC

## 2011-08-14 ENCOUNTER — Other Ambulatory Visit: Payer: Self-pay | Admitting: Cardiology

## 2011-08-14 ENCOUNTER — Other Ambulatory Visit: Payer: Self-pay | Admitting: Internal Medicine

## 2011-08-15 ENCOUNTER — Telehealth: Payer: Self-pay | Admitting: Internal Medicine

## 2011-08-15 MED ORDER — ALBUTEROL SULFATE HFA 108 (90 BASE) MCG/ACT IN AERS: 2.0000 | INHALATION_SPRAY | Freq: Four times a day (QID) | RESPIRATORY_TRACT | Status: DC | PRN

## 2011-08-15 NOTE — Telephone Encounter (Signed)
I spoke with pt and he stated he needed a refill on his ventolin. I advised him will send rx but needed to keep OV for further refills. He voiced her understanding and needed nothing further

## 2011-08-29 ENCOUNTER — Other Ambulatory Visit: Payer: Self-pay | Admitting: Internal Medicine

## 2011-09-01 ENCOUNTER — Telehealth: Payer: Self-pay | Admitting: Internal Medicine

## 2011-09-01 MED ORDER — ALBUTEROL SULFATE HFA 108 (90 BASE) MCG/ACT IN AERS: 2.0000 | INHALATION_SPRAY | Freq: Four times a day (QID) | RESPIRATORY_TRACT | Status: DC | PRN

## 2011-09-01 NOTE — Telephone Encounter (Signed)
Refill sent. Pt is aware. Easten Maceachern, CMA  

## 2011-09-26 ENCOUNTER — Ambulatory Visit (INDEPENDENT_AMBULATORY_CARE_PROVIDER_SITE_OTHER): Payer: 59 | Admitting: Internal Medicine

## 2011-09-26 ENCOUNTER — Encounter: Payer: Self-pay | Admitting: Internal Medicine

## 2011-09-26 VITALS — BP 120/78 | HR 71 | Temp 97.0°F | Ht 69.0 in | Wt 242.4 lb

## 2011-09-26 DIAGNOSIS — M792 Neuralgia and neuritis, unspecified: Secondary | ICD-10-CM

## 2011-09-26 DIAGNOSIS — E8881 Metabolic syndrome: Secondary | ICD-10-CM

## 2011-09-26 DIAGNOSIS — J449 Chronic obstructive pulmonary disease, unspecified: Secondary | ICD-10-CM

## 2011-09-26 DIAGNOSIS — G548 Other nerve root and plexus disorders: Secondary | ICD-10-CM

## 2011-09-26 MED ORDER — ALBUTEROL SULFATE (2.5 MG/3ML) 0.083% IN NEBU: 2.5000 mg | INHALATION_SOLUTION | Freq: Four times a day (QID) | RESPIRATORY_TRACT | Status: DC

## 2011-09-26 MED ORDER — IPRATROPIUM BROMIDE 0.02 % IN SOLN: 500.0000 ug | Freq: Four times a day (QID) | RESPIRATORY_TRACT | Status: DC

## 2011-09-26 NOTE — Patient Instructions (Signed)
#  Obstructive lung disease - Too bad cost is an issue  - in order to combat cost we can try nebulizer treatment; might be cheaper  - take albuterol and atrovent generic nebulizer treatment 4 times daily with a nebulizer machine - take spiriva till we get nebulizer - no need to take symbicort due to cost   #Neuropathic pain - Glad you are better and off gabapentin  #Weight and diabetes  - avoid rice, breads, pastas, sugars, desserts, corn  - eat more vegetables

## 2011-09-26 NOTE — Progress Notes (Signed)
Subjective:    Patient ID: [name], male    DOB: Feb 24, 1956, 56 y.o.   MRN: [id number]  HPI  1. 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 - Clinical worsening to stage 4 Fev1 to 1.34L/37%, Ratio 49 in setting of mdi refusal ($$) - Nov 2012 - No desaturation on submaximal exertion Nov 2012 - CAT score at baseline (without med Rx) : 26  - Ma 2013  2. Ex-smoker - quit may 2011  3. Class 2-3 dyspnea but pre-op VO2 max 4ml/kg/min - CPST 02/01/2010  4. RUL PET Hot interemediate prob nodule, ENB non-diagnostic 9/28/20111. Culture: mycobacterium kansasii. Not on drug Rx  5. 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 Metabolic Syndrome with Obesitywith ESS - 3 in June 2012  7.Compliance with meds: Cost issues with MDI v reluctance to take MDI  - June 2012 - reluctant - Dec 2012 - samples given, new insurance + but still not buying mdi due to out of pocket cost   OV 09/26/2011 Six month followup for all the above multiple issues.  Feels stable.  Denies AECOPD symptoms  Off mdi - cost issues. Unable to afford even $50 per mdi.  Dysoneic with climibing 1 flight and vacuuming at house since coming off mdi Wheeiy at work env due to fabric exposure and carrying boxes No aecopd acute deterioration All symptooms above are chronic Off gabpentin too due to cost and fact pain is better  CAT COPD Symptom and Quality of Life Score - reflects below a score of 26 and high symptom burden of copd placing him in high risk category with dyspnea and cough/fatigue main issues  CAT COPD Symptom and Quality of Life Score ([employer])  0 (no burden) to 5 (highest burden)  Never Cough -> Cough all the time 3  No phlegm in chest -> Chest is full of phlegm 3  No chest tightness -> Chest feels very tight 2  No dyspnea for 1 flight stairs/hill -> Very dyspneic for 1 flight of stairs 4  No limitations for ADL at home -> Very limited with  ADL at home 5  Confident leaving home -> Not at all confident leaving home 2  Sleep soundly -> Do not sleep soundly because of lung condition 4  Lots of Energy -> No energy at all 3  TOTAL Score (max 40)  26     Review of Systems  Constitutional: Positive for fatigue.  Eyes: Negative.   Respiratory: Positive for cough, chest tightness and stridor. Negative for apnea.   Cardiovascular: Negative.   Gastrointestinal: Negative.   Genitourinary: Negative.   Musculoskeletal: Negative.   Skin: Negative.   Neurological: Negative.   Hematological: Negative.   Psychiatric/Behavioral: Negative.        Objective:   Physical Exam  Physical Exam  Nursing note and vitals reviewed.  Constitutional: He is oriented to person, place, and time. He appears well-developed and well-nourished. No distress.  obese  HENT:  Head: Normocephalic and atraumatic.  Right Ear: External ear normal.  Left Ear: External ear normal.  Mouth/Throat: Oropharynx is clear and moist. No oropharyngeal exudate.  Eyes: Conjunctivae and EOM are normal. Pupils are equal, round, and reactive to light. Right eye exhibits no discharge. Left eye exhibits no discharge. No scleral icterus.  Neck: Normal range of motion. Neck supple. No JVD present. No tracheal deviation present. No thyromegaly present.  Cardiovascular: Normal  rate, regular rhythm and intact distal pulses. Exam reveals no gallop and no friction rub.  No murmur heard.  Pulmonary/Chest: Effort normal and breath sounds normal. No respiratory distress. He has no wheezes. He has no rales. He exhibits no tenderness.  No longer Tender in areas of scar of prior surgery  Abdominal: Soft. Bowel sounds are normal. He exhibits no distension and no mass. There is no tenderness. There is no rebound and no guarding.  Musculoskeletal: Normal range of motion. He exhibits no edema and no tenderness.  Lymphadenopathy:  He has no cervical adenopathy.  Neurological: He is alert  and oriented to person, place, and time. He has normal reflexes. No cranial nerve deficit. Coordination normal.  Skin: Skin is warm and dry. No rash noted. He is not diaphoretic. No erythema. No pallor.  Psychiatric: FLAT AFFECT as before. His behavior is normal. Judgment and thought content normal.          Assessment & Plan:

## 2011-09-27 DIAGNOSIS — E8881 Metabolic syndrome: Secondary | ICD-10-CM | POA: Insufficient documentation

## 2011-09-27 NOTE — Assessment & Plan Note (Signed)
Discussed in detail about healthy  Lifestyle to help cut down health care costs in the long run. Expalined concepts of low carb diet and avoidance of pastas and bread and sugars. He says he is already working on this though his blood sugar admittedly is out of control. Will keep encouraging. Next visit will give him a diet sheet

## 2011-09-27 NOTE — Assessment & Plan Note (Signed)
Again discussed high symptom burden and the need for medication rx. Again he says cost is an issue but he does not call me about these problems instead opting to wait for next visit. We ddicussed using generic alb/atrovent nebs schjeduled to see if these will shave some cost. He is willing to look into this. Till then, will tide over with some spiriva samples

## 2011-09-27 NOTE — Assessment & Plan Note (Signed)
This is almost resolved. He is not taking gabapentin partly due to improvement but partly due to cost. Willcontinue to monitor clinically

## 2011-10-13 ENCOUNTER — Other Ambulatory Visit: Payer: Self-pay | Admitting: Internal Medicine

## 2011-11-04 ENCOUNTER — Other Ambulatory Visit: Payer: Self-pay | Admitting: Cardiology

## 2011-12-06 ENCOUNTER — Other Ambulatory Visit: Payer: Self-pay | Admitting: Cardiology

## 2011-12-09 ENCOUNTER — Other Ambulatory Visit: Payer: Self-pay | Admitting: *Deleted

## 2011-12-09 MED ORDER — CLOPIDOGREL BISULFATE 75 MG PO TABS: 75.0000 mg | ORAL_TABLET | Freq: Every day | ORAL | Status: DC

## 2012-01-02 ENCOUNTER — Other Ambulatory Visit: Payer: Self-pay | Admitting: Cardiology

## 2012-01-21 ENCOUNTER — Other Ambulatory Visit: Payer: Self-pay | Admitting: Internal Medicine

## 2012-02-12 ENCOUNTER — Other Ambulatory Visit: Payer: Self-pay | Admitting: Cardiology

## 2012-02-13 ENCOUNTER — Telehealth: Payer: Self-pay

## 2012-02-13 MED ORDER — METOPROLOL TARTRATE 25 MG PO TABS: 25.0000 mg | ORAL_TABLET | Freq: Two times a day (BID) | ORAL | Status: DC

## 2012-02-13 NOTE — Telephone Encounter (Signed)
Patient is to make a follow up appointment with Dr. Shirlee Latch.

## 2012-03-04 ENCOUNTER — Encounter: Payer: Self-pay | Admitting: Internal Medicine

## 2012-03-04 ENCOUNTER — Ambulatory Visit (INDEPENDENT_AMBULATORY_CARE_PROVIDER_SITE_OTHER): Payer: 59 | Admitting: Internal Medicine

## 2012-03-04 VITALS — BP 98/62 | HR 96 | Temp 98.3°F | Ht 70.0 in | Wt 242.8 lb

## 2012-03-04 DIAGNOSIS — J4489 Other specified chronic obstructive pulmonary disease: Secondary | ICD-10-CM

## 2012-03-04 DIAGNOSIS — E8881 Metabolic syndrome: Secondary | ICD-10-CM

## 2012-03-04 DIAGNOSIS — J449 Chronic obstructive pulmonary disease, unspecified: Secondary | ICD-10-CM

## 2012-03-04 NOTE — Progress Notes (Signed)
Subjective:    Patient ID: [name], male    DOB: Jul 18, 1955, 56 y.o.   MRN: [id number]  HPI 1. 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 - Clinical worsening to stage 4 Fev1 to 1.34L/37%, Ratio 49 in setting of mdi refusal ($$) - Nov 2012 - No desaturation on submaximal exertion Nov 2012 - CAT score at baseline (without med Rx) : 26  - Ma 2013  2. Ex-smoker - quit may 2011  3. Class 2-3 dyspnea but pre-op VO2 max 9ml/kg/min - CPST 02/01/2010  4. RUL PET Hot interemediate prob nodule, ENB non-diagnostic 9/28/20111. Culture: mycobacterium kansasii. Not on drug Rx  5. 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 Metabolic Syndrome with Obesitywith ESS - 3 in June 2012  7.Compliance with meds: Cost issues with MDI v reluctance to take MDI  - June 2012 - reluctant - Dec 2012 - samples given, new insurance + but still not buying mdi due to out of pocket cost   OV 09/26/2011 Six month followup for all the above multiple issues.  Feels stable.  Denies AECOPD symptoms  Off mdi - cost issues. Unable to afford even $50 per mdi.  Dysoneic with climibing 1 flight and vacuuming at house since coming off mdi Wheeiy at work env due to fabric exposure and carrying boxes No aecopd acute deterioration All symptooms above are chronic Off gabpentin too due to cost and fact pain is better  CAT COPD Symptom and Quality of Life Score - reflects below a score of 26 and high symptom burden of copd placing him in high risk category with dyspnea and cough/fatigue main issues  REC   #Obstructive lung disease  - Too bad cost is an issue  - in order to combat cost we can try nebulizer treatment; might be cheaper  - take albuterol and atrovent generic nebulizer treatment 4 times daily with a nebulizer machine  - take spiriva till we get nebulizer  - no need to take symbicort due to cost  #Neuropathic pain  - Glad you are better and off gabapentin    #Weight and diabetes  - avoid rice, breads, pastas, sugars, desserts, corn  - eat more vegetables   OV 03/04/2012   Followup COPD and obesity   COPD: stable. CAT score 26 and stble (see below). Past few days increased hoarseness of voice without change in cough, dyspnea or sputum. Says is due to weather change. Insists he is not in aECOPD. REfuses COPD flare Rx even though his exam showed wheeze. Says nebs working out better for him from $ standpiint but doing atrovent neb only bid due to work issues. Doing alb prn. REfuses neb steroids due to cost. Trying to get disability but says he needs to be laid off to apply for disability and cannot afford being laid off. Yet unhappy with work; they work him too hard despite his copd and give him bad insurance and give him hard time to take time off to visit MD  Weight: rviewed his diet. Lost 8# in 1 year but I noted he hardly eats and what he eats is mostly bread. Never eats nuts. Rarely eats vegetables.     CAT COPD Symptom and Quality of Life Score ([employer]) 5/71/13 03/04/2012   Never Cough -> Cough all the time 3 3  No phlegm in chest -> Chest is full of phlegm 3 4  No chest tightness -> Chest feels very tight 2 3  No dyspnea for 1 flight stairs/hill -> Very dyspneic for 1 flight of stairs 4 5  No limitations for ADL at home -> Very limited with ADL at home 5 5  Confident leaving home -> Not at all confident leaving home 2 1  Sleep soundly -> Do not sleep soundly because of lung condition 4 2  Lots of Energy -> No energy at all 3 3  TOTAL Score (max 40)  26 26   Review of Systems  Constitutional: Negative for fever and unexpected weight change.  HENT: Negative for ear pain, nosebleeds, congestion, sore throat, rhinorrhea, sneezing, trouble swallowing, dental problem, postnasal drip and sinus pressure.   Eyes: Negative for redness and itching.  Respiratory: Positive for cough and shortness of breath. Negative for chest  tightness and wheezing.   Cardiovascular: Negative for palpitations and leg swelling.  Gastrointestinal: Negative for nausea and vomiting.  Genitourinary: Negative for dysuria.  Musculoskeletal: Negative for joint swelling.  Skin: Negative for rash.  Neurological: Negative for headaches.  Hematological: Does not bruise/bleed easily.  Psychiatric/Behavioral: Negative for dysphoric mood. The patient is not nervous/anxious.       Current outpatient prescriptions:albuterol (PROVENTIL) (2.5 MG/3ML) 0.083% nebulizer solution, Take 3 mLs (2.5 mg total) by nebulization 4 (four) times daily., Disp: 75 mL, Rfl: 3;  atorvastatin (LIPITOR) 40 MG tablet, TAKE  ONE TABLET BY MOUTH NIGHTLY AT BEDTIME, Disp: 30 tablet, Rfl: 11;  BENICAR 20 MG tablet, TAKE ONE TABLET BY MOUTH EVERY DAY, Disp: 30 each, Rfl: 12 clopidogrel (PLAVIX) 75 MG tablet, Take 1 tablet (75 mg total) by mouth daily., Disp: 30 tablet, Rfl: 6;  gabapentin (NEURONTIN) 100 MG capsule, Take 100 mg by mouth 3 (three) times daily., Disp: , Rfl: ;  glipiZIDE (GLUCOTROL) 10 MG tablet, Take 10 mg by mouth 2 (two) times daily before a meal.  , Disp: , Rfl: ;  hydrochlorothiazide (MICROZIDE) 12.5 MG capsule, TAKE ONE CAPSULE BY MOUTH EVERY DAY, Disp: 90 capsule, Rfl: 0 , Rfl: 3;  ipratropium (ATROVENT) 0.02 % nebulizer solution, Take 2.5 mLs (500 mcg total) by nebulization 4 (four) times daily., Disp: 75 mL, Rfl: 3;  metoprolol tartrate (LOPRESSOR) 25 MG tablet, Take 1 tablet (25 mg total) by mouth 2 (two) times daily., Disp: 60 tablet, Rfl: 0 nitroGLYCERIN (NITROSTAT) 0.4 MG SL tablet, Place 0.4 mg under the tongue every 5 (five) minutes as needed. May repeat for up to 3 doses. , Disp: , Rfl: ;  VENTOLIN HFA 108 (90 BASE) MCG/ACT inhaler, INHALE 2 PUFFS EVERY 6 HOURS AS NEEDED FOR WHEEZING, SHORTNESS OFBREATH., Disp: 1 Inhaler, Rfl: 5;  DISCONTD: gabapentin (NEURONTIN) 100 MG capsule, Take 1 capsule (100 mg total) by mouth 3 (three) times daily., Disp: 90  capsule, Rfl: 0  Objective:   Physical Exam Nursing note and vitals reviewed.  Constitutional: He is oriented to person, place, and time. He appears well-developed and well-nourished. No distress.  obese  Filed Weights   03/04/12 1639  Weight: 242 lb 12.8 oz (110.133 kg)    HENT:  Head: Normocephalic and atraumatic.  Right Ear: External ear normal.  Left Ear: External ear normal.  Mouth/Throat: Oropharynx is clear and moist. No oropharyngeal exudate.  Eyes: Conjunctivae and EOM are normal. Pupils are equal, round, and reactive to light. Right eye exhibits no discharge. Left eye exhibits no discharge. No scleral icterus.  Neck: Normal range of motion. Neck supple. No JVD present. No tracheal deviation  present. No thyromegaly present.  Cardiovascular: Normal rate, regular rhythm and intact distal pulses. Exam reveals no gallop and no friction rub.  No murmur heard.  Pulmonary/Chest: Effort normal and breath sounds normal. No respiratory distress. He has wheezes which is a change but insists this is not copd flare and is because of missed medications. He has no rales. He exhibits no tenderness.  No longer Tender in areas of scar of prior surgery  Abdominal: Soft. Bowel sounds are normal. He exhibits no distension and no mass. There is no tenderness. There is no rebound and no guarding.  Musculoskeletal: Normal range of motion. He exhibits no edema and no tenderness.  Lymphadenopathy:  He has no cervical adenopathy.  Neurological: He is alert and oriented to person, place, and time. He has normal reflexes. No cranial nerve deficit. Coordination normal.  Skin: Skin is warm and dry. No rash noted. He is not diaphoretic. No erythema. No pallor.  Psychiatric: FLAT AFFECT as before. His behavior is normal. Judgment and thought content normal.            Assessment & Plan:

## 2012-03-04 NOTE — Patient Instructions (Addendum)
#Followup  - 9 months or sooner if needed  #COPD  - continue your medications  - you are wheezing but at your request will hold off treating this as copd flare because you feel this is because of missed medications today  - we will give you sample of ventolin HFA if we have it or pro-air hfa - CMA will ensure refills  - glad you haad flu shot  - we discussed rehab again but understand at this time due to work issues you cannot attend it  CAT score and walk test at followup   #WEight Management    - we discussed extensively about weight management   - follow low glycemic diet plan that I outlined for you after extensive discussion. Do not follow other plans  - General  - drink lot of water  - avoid all moderate and high glycemic foods especially bad fruits, breads, pastas, fried foods, battered foods, sugary foods  - make non-starchy vegetables your base in terms of volume you eat  - always make sure you balance good carbs, good protein and good fat source  - good carbs are non-starchy vegetables, uncanned beans in the left column and low glycemic fruits in the left colum  - good protein source is egg white, beans, tofu, fish, chicken breast, fish, [place] and bison. Remember meat has to be skinless  - good healthy fat source is nuts, and fish   - focusing on eating right healthy foods (left lane) and avoiding unhealthy foods (middle and right lane) is better way to lose weight than to go hypo-caloric  - focus on staying full by eating right  - having a daily and weekly plan for what you will eat and where you will eat depending on your work, social life schedule is very important   - watch out for misleading labels on grocery aisle: High Fiber and Low Fat labeled foods generally are high in bad carbs or sugar  - measure weight once  a week  - discipline and attitude is key. Do not care for anyone else's opinion or feelings. Only yours matters   - For breakfast  - most important meal  of the day. So, eat daily breakfast. Do not skip   - recommend 1/2 to 1 cup steel cut oat meal or 1/2 to 1 cup fiber one 60 cal   Or  1 to 1.5 cups Kashi go-lean with non-fat plain milk or 60 Cal Silk Soy mild. Can add Berries. Can have egg at same time for breakfast  - For snacks  - recommend total 2-3 snacks per day  - snack should be light and filling  - best times are between breakfast and lunch, lunch and dinner and sometimes post-dinner snack  - Nut are great snacks. Stick to low glycemic nuts (less than 50gm per day) and eat only the nuts in the left lane like peanuts, pista, almond, walnut  - Low glycemic fruits are great snacks. Have 1-2 servings each day of fruits from the left lane   - If you like yogurt or cottage cheese - recommend Oikos or Fage 0% greek yogourt or Plan non-fat yogurt or Breakstone non-fat cottage cheese. Theyse have the least sugar. Do no exceed 100-200 gram per day. Fruit yogurts are the worst  - For Lunch and dinner  - unlimited non-starchy vegetable (prefer raw fresh or roasted or grilled) with skinless chicken or fish  - Special Notes  - Nuts: Nut are great snacks  and have heart benefits. Stick to low glycemic nuts (less than 50gm per day) and eat only the nuts in the left lane like peanuts, pista, almond, walnuts  -  Ok to eat above nuts daily but only < 50gm/day  - If you eat more than 50gm/day then you run risk of eating too many calories or saturated fat  - AVoid nuts glazed with sugar. Nuts have to be in salted/original form or roasted   - Fruits: Eat 1-2 fresh fruit servings daily but fruits can be dangerous because of high sugar content. So, choose your fruits wisely. Eat only the low glycemic fruits (left lane). Eat them fresh.  Do not eat them canned  - Avoid all fresh juices except if you use the low glycemic fruits and make them yourself without adding extra sugar   - Dairy: Is optional. Eat zero fat or low fat, fruit free yogurts or cottage cheese  but not more than 100-200g per day  - Restaurant  - all restaurants have bad and good choices. Even fast food restaurants offer you good choices  - at restaurants do no fall prey to social pressure.. One way to eat healthy at restaurant is to eat healthy snack or light healthy meal before you go to restaurant so that will prevent your cravings  - Restaurants with worst choices: [place] (except [employer], or [place]), [place], [place]. At these restaurants avoid the bread, curry, fried and battered foods and chips  - Restaurants with best choices: greek, [place], [place], [place] (again here avoid bread, deep fried stuffed and pasta)  - Restaurants with Ok choice: McDonald's, [employer], [employer] (again here avoid the bread, fried stuff, fried meat)  - Always ask for grilled meat or vegetables, and fresh salad choices (get your salad dressing as low fat and to the side)

## 2012-03-05 ENCOUNTER — Ambulatory Visit: Payer: 59 | Admitting: Internal Medicine

## 2012-03-07 NOTE — Assessment & Plan Note (Signed)
#WEight Management    - we discussed extensively about weight management   - follow low glycemic diet plan that I outlined for you after extensive discussion. Do not follow other plans  - General  - drink lot of water  - avoid all moderate and high glycemic foods especially bad fruits, breads, pastas, fried foods, battered foods, sugary foods  - make non-starchy vegetables your base in terms of volume you eat  - always make sure you balance good carbs, good protein and good fat source  - good carbs are non-starchy vegetables, uncanned beans in the left column and low glycemic fruits in the left colum  - good protein source is egg white, beans, tofu, fish, chicken breast, fish, [place] and bison. Remember meat has to be skinless  - good healthy fat source is nuts, and fish   - focusing on eating right healthy foods (left lane) and avoiding unhealthy foods (middle and right lane) is better way to lose weight than to go hypo-caloric  - focus on staying full by eating right  - having a daily and weekly plan for what you will eat and where you will eat depending on your work, social life schedule is very important   - watch out for misleading labels on grocery aisle: High Fiber and Low Fat labeled foods generally are high in bad carbs or sugar  - measure weight once  a week  - discipline and attitude is key. Do not care for anyone else's opinion or feelings. Only yours matters   - For breakfast  - most important meal of the day. So, eat daily breakfast. Do not skip   - recommend 1/2 to 1 cup steel cut oat meal or 1/2 to 1 cup fiber one 60 cal   Or  1 to 1.5 cups Kashi go-lean with non-fat plain milk or 60 Cal Silk Soy mild. Can add Berries. Can have egg at same time for breakfast  - For snacks  - recommend total 2-3 snacks per day  - snack should be light and filling  - best times are between breakfast and lunch, lunch and dinner and sometimes post-dinner snack  - Nut are great snacks. Stick to  low glycemic nuts (less than 50gm per day) and eat only the nuts in the left lane like peanuts, pista, almond, walnut  - Low glycemic fruits are great snacks. Have 1-2 servings each day of fruits from the left lane   - If you like yogurt or cottage cheese - recommend Oikos or Fage 0% greek yogourt or Plan non-fat yogurt or Breakstone non-fat cottage cheese. Theyse have the least sugar. Do no exceed 100-200 gram per day. Fruit yogurts are the worst  - For Lunch and dinner  - unlimited non-starchy vegetable (prefer raw fresh or roasted or grilled) with skinless chicken or fish  - Special Notes  - Nuts: Nut are great snacks and have heart benefits. Stick to low glycemic nuts (less than 50gm per day) and eat only the nuts in the left lane like peanuts, pista, almond, walnuts  -  Ok to eat above nuts daily but only < 50gm/day  - If you eat more than 50gm/day then you run risk of eating too many calories or saturated fat  - AVoid nuts glazed with sugar. Nuts have to be in salted/original form or roasted   - Fruits: Eat 1-2 fresh fruit servings daily but fruits can be dangerous because of high sugar content. So, choose your fruits  wisely. Eat only the low glycemic fruits (left lane). Eat them fresh.  Do not eat them canned  - Avoid all fresh juices except if you use the low glycemic fruits and make them yourself without adding extra sugar   - Dairy: Is optional. Eat zero fat or low fat, fruit free yogurts or cottage cheese but not more than 100-200g per day  - Restaurant  - all restaurants have bad and good choices. Even fast food restaurants offer you good choices  - at restaurants do no fall prey to social pressure.. One way to eat healthy at restaurant is to eat healthy snack or light healthy meal before you go to restaurant so that will prevent your cravings  - Restaurants with worst choices: [place] (except [employer], or [place]), [place], [place]. At these restaurants avoid the bread, curry,  fried and battered foods and chips  - Restaurants with best choices: greek, [place], [place], [place] (again here avoid bread, deep fried stuffed and pasta)  - Restaurants with Ok choice: McDonald's, [employer], [employer] (again here avoid the bread, fried stuff, fried meat)  - Always ask for grilled meat or vegetables, and fresh salad choices (get your salad dressing as low fat and to the side)

## 2012-03-07 NOTE — Assessment & Plan Note (Signed)
#  COPD  - continue your medications  - you are wheezing but at your request will hold off treating this as copd flare because you feel this is because of missed medications today  - we will give you sample of ventolin HFA if we have it or pro-air hfa - CMA will ensure refills  - glad you haad flu shot  - we discussed rehab again but understand at this time due to work issues you cannot attend it  CAT score and walk test at followup

## 2012-03-29 ENCOUNTER — Other Ambulatory Visit: Payer: Self-pay | Admitting: Internal Medicine

## 2012-03-31 ENCOUNTER — Other Ambulatory Visit: Payer: Self-pay | Admitting: Cardiology

## 2012-03-31 ENCOUNTER — Encounter: Payer: Self-pay | Admitting: *Deleted

## 2012-03-31 ENCOUNTER — Ambulatory Visit (INDEPENDENT_AMBULATORY_CARE_PROVIDER_SITE_OTHER): Payer: 59 | Admitting: Cardiology

## 2012-03-31 ENCOUNTER — Encounter: Payer: Self-pay | Admitting: Cardiology

## 2012-03-31 VITALS — BP 116/58 | HR 91 | Ht 70.0 in | Wt 237.0 lb

## 2012-03-31 DIAGNOSIS — E785 Hyperlipidemia, unspecified: Secondary | ICD-10-CM

## 2012-03-31 DIAGNOSIS — R079 Chest pain, unspecified: Secondary | ICD-10-CM

## 2012-03-31 DIAGNOSIS — I2510 Atherosclerotic heart disease of native coronary artery without angina pectoris: Secondary | ICD-10-CM

## 2012-03-31 DIAGNOSIS — J441 Chronic obstructive pulmonary disease with (acute) exacerbation: Secondary | ICD-10-CM

## 2012-03-31 MED ORDER — LOSARTAN POTASSIUM 50 MG PO TABS: 50.0000 mg | ORAL_TABLET | Freq: Every day | ORAL | Status: DC

## 2012-03-31 NOTE — Patient Instructions (Addendum)
Stop benicar.   Start losartan 50mg  daily.  Your physician has requested that you have en exercise stress myoview. For further information please visit [email or web address] Please follow instruction sheet, as given.    Your physician recommends that you have  lab work today--BMET.  Your physician wants you to follow-up in: 6 months with Dr Shirlee Latch. (May 2014). You will receive a reminder letter in the mail two months in advance. If you don't receive a letter, please call our office to schedule the follow-up appointment.

## 2012-04-01 LAB — BASIC METABOLIC PANEL
BUN: 26 mg/dL — ABNORMAL HIGH (ref 6–23)
Calcium: 9.2 mg/dL (ref 8.4–10.5)
GFR: 35.03 mL/min — ABNORMAL LOW (ref >60.00)
Glucose, Bld: 202 mg/dL — ABNORMAL HIGH (ref 70–99)
Sodium: 137 mEq/L (ref 135–145)

## 2012-04-01 NOTE — Progress Notes (Signed)
Patient ID: [name], male   DOB: 08/25/1955, 56 y.o.   MRN: [id number] PCP: Dr. Fisher  56 yo with h/o HTN, DM, hyperlipidemia, and smoking who developed NSTEMI in 6/10 returns for followup.  At that time, he was found to have a totally occluded mid RCA (known from prior studies) and a tight distal  CFX stenosis.  A drug-eluting stent was placed in the distal CFX, which was though to be the infarct-related lesion.  EF was preserved on left ventriculogram.  Patient had a lung mass removed in 2011.  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  He has significant COPD followed by Dr. Ramaswamy.   Since I last saw him, he has gradually become more short of breath with exertion.  He is dyspneic after walking 100 yards, with sweeping or mopping, and with steps.  He has had 2 episodes in the last few months of severe chest tightness.  The first happened while carrying a load of paper down steps.  He became very short of breath with chest tightness and felt his heart racing.  He almost passed out and actually tripped and fell down the steps.  The second episode occurred again while carrying a heavy load of paper in his office.  He developed chest tightness that was rather severe.  Besides these episodes, he has occasional atypical chest pain.  He is also limited by chronic low back pain.  He is planning to file for disability.   Labs (6/10):  Creatinine 1.48, K normal Labs (5/11): K 4, creatinine 1.6, LDL 53, HDL 30 Labs (8/13): LDL 73, HDL 26, K 4.5, creatinine 1.58  ECG: NSR, normal  Allergies (verified):  1)  ! Pcn  Family History: Family history of stroke.   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  Single  Tobacco Use - Former smoker.  Quit in May 2011.  1ppd x 38 yrs Alcohol Use - yes Regular Exercise - yes Drug Use - no  Past Medical History: 1.  CAD:  PCI 3/08 to OM2 and mid CFX.   NSTEMI 6/10.  Prior stents patent.  90% distal CFX, totally occluded mRCA (old) with collaterals.  EF 55% on LV-gram.  Xience DES 2.5 x 23 mm to distal CFX.  Echo (7.12): EF 55-60%, grade I diastolic dysfunction, normal RV size and systolic function.  2.  DM2 3.  Hyperlipidemia 4.  COPD:   - quit tobacco 5/11.   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 Ramaswamy - Aug 2011 5.  CKD, last creatinine 1.6 6.  obesity 7..  History of CVA without residual deficits 8.  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  -  Rx for CAP end May 2011  - Persistent and PET positive - Aug 2011  - ENB Bx 02/06/2010 - indeterminate  - Onciummne lung cancer antigen panel - Negative  03/01/2010 (test of poor sensitivity)    - Culture with Mycobacterium kansasii   ROS: All systems reviewed and negative except as per HPI.   Current Outpatient Prescriptions  Medication Sig Dispense Refill  . albuterol (PROVENTIL) (2.5 MG/3ML) 0.083% nebulizer solution Take 3   mLs (2.5 mg total) by nebulization 4 (four) times daily.  75 mL  3  . atorvastatin (LIPITOR) 40 MG tablet TAKE  ONE TABLET BY MOUTH NIGHTLY AT BEDTIME  30 tablet  11  . clopidogrel (PLAVIX) 75 MG tablet Take 1 tablet (75 mg total) by mouth daily.  30 tablet  6  . gabapentin (NEURONTIN) 100 MG capsule Take 100 mg by mouth 3 (three) times daily.      . glipiZIDE (GLUCOTROL) 10 MG tablet Take 10 mg by mouth 2 (two) times daily before a meal.        . hydrochlorothiazide (MICROZIDE) 12.5 MG capsule TAKE ONE CAPSULE BY MOUTH EVERY DAY  90 capsule  0  . ipratropium (ATROVENT HFA) 17 MCG/ACT inhaler Inhale 2 puffs into the lungs 2 (two) times daily.  1 Inhaler  3  . ipratropium (ATROVENT) 0.02 % nebulizer solution Take 2.5 mLs (500  mcg total) by nebulization 4 (four) times daily.  75 mL  3  . metoprolol tartrate (LOPRESSOR) 25 MG tablet Take 1 tablet (25 mg total) by mouth 2 (two) times daily.  60 tablet  0  . nitroGLYCERIN (NITROSTAT) 0.4 MG SL tablet Place 0.4 mg under the tongue every 5 (five) minutes as needed. May repeat for up to 3 doses.       . VENTOLIN HFA 108 (90 BASE) MCG/ACT inhaler INHALE 2 PUFFS EVERY 6 HOURS  AS NEEDED FOR WHEEZING AND SHORTNESS OFBREATH.  18 each  4  . losartan (COZAAR) 50 MG tablet Take 1 tablet (50 mg total) by mouth daily.  30 tablet  6    BP 116/58  Pulse 91  Ht 5' 10" (1.778 m)  Wt 237 lb (107.502 kg)  BMI 34.01 kg/m2 General:  Well developed, well nourished, in no acute distress.  Obese.  Neck:  Neck thick, no JVD. No masses, thyromegaly or abnormal cervical nodes. Lungs:  Distant breath sounds bilaterally.  Heart:  Non-displaced PMI, chest non-tender; regular rate and rhythm, S1, S2 without murmurs, rubs or gallops. Carotid upstroke normal, no bruit. Pedals normal pulses. No edema, no varicosities. Abdomen:  Bowel sounds positive; abdomen soft and non-tender without masses, organomegaly, or hernias noted. No hepatosplenomegaly. Extremities:  No clubbing or cyanosis. Neurologic:  Alert and oriented x 3. Psych:  Depressed affect.  Assessment/Plan:  1. CAD: Patient has had 2 episodes of exertional chest pain in recent months along with episodes of atypical chest pain.  His exertional dyspnea is worsening.  I will set him up for an ETT-myoview to assess for ischemia. Continue ASA, Plavix, ARB, metoprolol.  I am going to keep him on Plavix as he has had no problems with it and has had multiple stents.  I suspect that the stents in 2008 were 1st generation DES. I will have him stop olmesartan and use losartan 50 mg daily instead (wants cheaper medication).  2. Hyperlipidemia: LDL at goal (< 70) in 8/13. 3. Dyspnea: He does not appear volume overloaded on exam.  Most likely this is from  COPD, but as above plan on getting ETT-myoview.   [name] 04/01/2012  

## 2012-04-05 ENCOUNTER — Telehealth: Payer: Self-pay | Admitting: *Deleted

## 2012-04-05 DIAGNOSIS — I2510 Atherosclerotic heart disease of native coronary artery without angina pectoris: Secondary | ICD-10-CM

## 2012-04-05 DIAGNOSIS — R079 Chest pain, unspecified: Secondary | ICD-10-CM

## 2012-04-05 MED ORDER — LOSARTAN POTASSIUM 25 MG PO TABS: 25.0000 mg | ORAL_TABLET | Freq: Every day | ORAL | Status: DC

## 2012-04-05 NOTE — Telephone Encounter (Signed)
I spoke with pt this morning about lab results. He has already taken all meds this am. He will stop HCTZ tomorrow and decrease Losartan to 25mg  daily. New prescription sent in to his pharmacy. Return for BMP on same day ( 04/14/12) as exercise myoview. Mylo Red RN

## 2012-04-12 ENCOUNTER — Other Ambulatory Visit: Payer: 59

## 2012-04-12 ENCOUNTER — Other Ambulatory Visit: Payer: Self-pay | Admitting: Cardiology

## 2012-04-14 ENCOUNTER — Other Ambulatory Visit (INDEPENDENT_AMBULATORY_CARE_PROVIDER_SITE_OTHER): Payer: 59

## 2012-04-14 ENCOUNTER — Ambulatory Visit (HOSPITAL_COMMUNITY): Payer: 59 | Attending: Cardiology | Admitting: Radiology

## 2012-04-14 VITALS — BP 121/78 | Ht 70.0 in | Wt 240.0 lb

## 2012-04-14 DIAGNOSIS — I10 Essential (primary) hypertension: Secondary | ICD-10-CM | POA: Insufficient documentation

## 2012-04-14 DIAGNOSIS — R0609 Other forms of dyspnea: Secondary | ICD-10-CM | POA: Insufficient documentation

## 2012-04-14 DIAGNOSIS — I2510 Atherosclerotic heart disease of native coronary artery without angina pectoris: Secondary | ICD-10-CM

## 2012-04-14 DIAGNOSIS — Z87891 Personal history of nicotine dependence: Secondary | ICD-10-CM | POA: Insufficient documentation

## 2012-04-14 DIAGNOSIS — Z8673 Personal history of transient ischemic attack (TIA), and cerebral infarction without residual deficits: Secondary | ICD-10-CM | POA: Insufficient documentation

## 2012-04-14 DIAGNOSIS — R002 Palpitations: Secondary | ICD-10-CM | POA: Insufficient documentation

## 2012-04-14 DIAGNOSIS — R079 Chest pain, unspecified: Secondary | ICD-10-CM

## 2012-04-14 DIAGNOSIS — R0989 Other specified symptoms and signs involving the circulatory and respiratory systems: Secondary | ICD-10-CM | POA: Insufficient documentation

## 2012-04-14 DIAGNOSIS — R000 Tachycardia, unspecified: Secondary | ICD-10-CM | POA: Insufficient documentation

## 2012-04-14 DIAGNOSIS — E785 Hyperlipidemia, unspecified: Secondary | ICD-10-CM | POA: Insufficient documentation

## 2012-04-14 DIAGNOSIS — R0602 Shortness of breath: Secondary | ICD-10-CM | POA: Insufficient documentation

## 2012-04-14 DIAGNOSIS — E119 Type 2 diabetes mellitus without complications: Secondary | ICD-10-CM | POA: Insufficient documentation

## 2012-04-14 LAB — BASIC METABOLIC PANEL
Calcium: 8.6 mg/dL (ref 8.4–10.5)
Creatinine, Ser: 1.5 mg/dL (ref 0.4–1.5)

## 2012-04-14 MED ORDER — TECHNETIUM TC 99M SESTAMIBI GENERIC - CARDIOLITE
33.0000 | Freq: Once | INTRAVENOUS | Status: AC | PRN
  Administered 2012-04-14: 33 via INTRAVENOUS

## 2012-04-14 MED ORDER — REGADENOSON 0.4 MG/5ML IV SOLN
0.4000 mg | Freq: Once | INTRAVENOUS | Status: AC
  Administered 2012-04-14: 0.4 mg via INTRAVENOUS

## 2012-04-14 MED ORDER — TECHNETIUM TC 99M SESTAMIBI GENERIC - CARDIOLITE
11.0000 | Freq: Once | INTRAVENOUS | Status: AC | PRN
  Administered 2012-04-14: 11 via INTRAVENOUS

## 2012-04-14 MED ORDER — ALBUTEROL SULFATE (5 MG/ML) 0.5% IN NEBU
5.0000 mg | INHALATION_SOLUTION | Freq: Once | RESPIRATORY_TRACT | Status: AC
  Administered 2012-04-14: 5 mg via RESPIRATORY_TRACT

## 2012-04-14 NOTE — Progress Notes (Addendum)
[facility] SITE 3 NUCLEAR MED [address] [id number] [place] [address] [id number]  Cardiology Nuclear Med Study  [name] is a 56 y.o. male     MRN : [id number]     DOB: 17-Dec-1955  Procedure Date: 04/14/2012  Nuclear Med Background Indication for Stress Test:  Evaluation for Ischemia and Stent Patency History:  2012 Echo EF 55-60%, 2010 MI Stent Cfx- EF 55% Cardiac Risk Factors: CVA, History of Smoking, Hypertension, Lipids and NIDDM  Symptoms:  Chest Pain, DOE, Palpitations, Rapid HR and SOB   Nuclear Pre-Procedure Caffeine/Decaff Intake:  None > 12 hrs NPO After: 5:30am   Lungs:  Mild exp. wheezing O2 Sat: 97% on room air. IV 0.9% NS with Angio Cath:  20g  IV Site: R Antecubital x 1, tolerated well IV Started by:  Irean Hong, RN  Chest Size (in):  46 Cup Size: n/a  Height: 5\' 10"  (1.778 m)  Weight:  240 lb (108.863 kg)  BMI:  Body mass index is 34.44 kg/(m^2). Tech Comments:  Held lopressor x 24 hrs. On arrival patient had audible wheezes,Lung fields with minimal expiratory wheezing,and diminished breath sounds. O2 Sat 95% RA. Pt uses nebulizer treatments four times a day at home.Last treatment was at 6:00am. Nebulizer treatment with albuterol 5 mg solution given via mask with 8L oxygen at 12:45 after arrival. Irean Hong, RN.     Nuclear Med Study 1 or 2 day study: 1 day  Stress Test Type:  Treadmill/Lexiscan  Reading MD: Cassell Clement, MD  Order Authorizing Provider:  [name], MD  Resting Radionuclide: Technetium 102m Sestamibi  Resting Radionuclide Dose: 11.0 mCi   Stress Radionuclide:  Technetium 70m Sestamibi  Stress Radionuclide Dose: 33.0 mCi           Stress Protocol Rest HR: 84 Stress HR: 116  Rest BP: 121/78 Stress BP: 140/87  Exercise Time (min): 2:00 METS: 1.0   Predicted Max HR: 164 bpm % Max HR: 70.73 bpm Rate Pressure Product: 13244   Dose of Adenosine (mg):  n/a Dose of Lexiscan: 0.4 mg  Dose of  Atropine (mg): n/a Dose of Dobutamine: n/a mcg/kg/min (at max HR)  Stress Test Technologist: Bonnita Levan, RN  Nuclear Technologist:  Domenic Polite, CNMT     Rest Procedure:  Myocardial perfusion imaging was performed at rest 45 minutes following the intravenous administration of Technetium 82m Tetrofosmin. Rest ECG: NSR with non-specific ST-T wave changes  Stress Procedure:   The patient attempted to walk on the treadmill for 2:00. He became extremely SOB with 02 Sats dropping to 85% and was unable to achieve target heart rate. He was changed to a sitting Lexiscan. The patient received IV Lexiscan 0.4 mg over 15-seconds.  Technetium 22m Tetrofosmin injected at 30-seconds. Quantitative spect images were obtained after a 45 minute delay. Stress ECG: No significant change from baseline ECG  QPS Raw Data Images:  There is interference from nuclear activity from structures below the diaphragm. This affects the ability to read the study. Stress Images:  There is decreased uptake in the apex and inferior wall Rest Images:  There is decreased uptake in the inferior wall. Subtraction (SDS):  These findings are consistent with ischemia. Transient Ischemic Dilatation (Normal <1.22):  1.06 Lung/Heart Ratio (Normal <0.45):  0.34  Quantitative Gated Spect Images QGS EDV:  94 ml QGS ESV:  28 ml  Impression Exercise Capacity:  Lexiscan with no exercise. BP Response:  Hypotensive blood pressure response. Clinical Symptoms:  Extreme dyspnea when attempted to exercise.  Therefore changed to lexiscan. ECG Impression:  No significant ST segment change suggestive of ischemia. Comparison with Prior Nuclear Study: No images to compare  Overall Impression:  Intermediate risk stress nuclear study. There is a small reversible perfusion defect of moderate severity at apex.  There is decreased perfusion on stress and rest images involving moderate sized area of midinferior and basal inferior regions with partial  reversibility. This area is difficult to interpret because of interference from structures below the diaphragm.  LV Ejection Fraction: 71%.  LV Wall Motion:  NL LV Function; NL Wall Motion  [employer]  Intermediate risk study.  I think he should have LHC given some chest pain.  Would set up for next week in J[facility] lab, radial access.  [name] 04/15/2012 3:43 PM

## 2012-04-15 NOTE — Progress Notes (Signed)
Dr Shirlee Latch spoke with pt. Dr Shirlee Latch recommended LHC radial to be done 04/23/12 at 12N.  Pt is aware of this and is going to call back on Monday and confirm this date and time works for him.

## 2012-04-19 ENCOUNTER — Other Ambulatory Visit: Payer: Self-pay | Admitting: *Deleted

## 2012-04-19 ENCOUNTER — Other Ambulatory Visit (INDEPENDENT_AMBULATORY_CARE_PROVIDER_SITE_OTHER): Payer: 59

## 2012-04-19 ENCOUNTER — Encounter: Payer: Self-pay | Admitting: *Deleted

## 2012-04-19 DIAGNOSIS — I2510 Atherosclerotic heart disease of native coronary artery without angina pectoris: Secondary | ICD-10-CM

## 2012-04-19 LAB — CBC WITH DIFFERENTIAL/PLATELET
Basophils Relative: 0.4 % (ref 0.0–3.0)
Eosinophils Absolute: 0.2 10*3/uL (ref 0.0–0.7)
Eosinophils Relative: 3.1 % (ref 0.0–5.0)
Hemoglobin: 14.5 g/dL (ref 13.0–17.0)
Lymphocytes Relative: 18.4 % (ref 12.0–46.0)
MCHC: 33.3 g/dL (ref 30.0–36.0)
Monocytes Relative: 7.6 % (ref 3.0–12.0)
Neutro Abs: 5 10*3/uL (ref 1.4–7.7)
RBC: 4.48 Mil/uL (ref 4.22–5.81)

## 2012-04-19 LAB — BASIC METABOLIC PANEL
CO2: 28 mEq/L (ref 19–32)
Calcium: 9 mg/dL (ref 8.4–10.5)
Sodium: 135 mEq/L (ref 135–145)

## 2012-04-19 NOTE — Progress Notes (Signed)
Spoke with pt. Pt confirmed he will have cath 04/23/12.

## 2012-04-22 ENCOUNTER — Other Ambulatory Visit: Payer: Self-pay | Admitting: Cardiology

## 2012-04-22 ENCOUNTER — Other Ambulatory Visit (INDEPENDENT_AMBULATORY_CARE_PROVIDER_SITE_OTHER): Payer: 59

## 2012-04-22 DIAGNOSIS — I2510 Atherosclerotic heart disease of native coronary artery without angina pectoris: Secondary | ICD-10-CM

## 2012-04-22 DIAGNOSIS — I200 Unstable angina: Secondary | ICD-10-CM

## 2012-04-22 LAB — BASIC METABOLIC PANEL
CO2: 28 mEq/L (ref 19–32)
Chloride: 100 mEq/L (ref 96–112)
Glucose, Bld: 188 mg/dL — ABNORMAL HIGH (ref 70–99)
Potassium: 4.2 mEq/L (ref 3.5–5.1)
Sodium: 134 mEq/L — ABNORMAL LOW (ref 135–145)

## 2012-04-23 ENCOUNTER — Encounter (HOSPITAL_BASED_OUTPATIENT_CLINIC_OR_DEPARTMENT_OTHER)
Admission: RE | Disposition: A | Discharge status: Home / Self Care | Payer: Self-pay | Source: Ambulatory Visit | Attending: Cardiology

## 2012-04-23 ENCOUNTER — Inpatient Hospital Stay (HOSPITAL_BASED_OUTPATIENT_CLINIC_OR_DEPARTMENT_OTHER)
Admission: RE | Admit: 2012-04-23 | Discharge: 2012-04-23 | Disposition: A | Discharge status: Home / Self Care | Payer: 59 | Source: Ambulatory Visit | Attending: Cardiology | Admitting: Cardiology

## 2012-04-23 DIAGNOSIS — J449 Chronic obstructive pulmonary disease, unspecified: Secondary | ICD-10-CM | POA: Insufficient documentation

## 2012-04-23 DIAGNOSIS — I2510 Atherosclerotic heart disease of native coronary artery without angina pectoris: Secondary | ICD-10-CM | POA: Insufficient documentation

## 2012-04-23 DIAGNOSIS — J4489 Other specified chronic obstructive pulmonary disease: Secondary | ICD-10-CM | POA: Insufficient documentation

## 2012-04-23 DIAGNOSIS — Z79899 Other long term (current) drug therapy: Secondary | ICD-10-CM | POA: Insufficient documentation

## 2012-04-23 DIAGNOSIS — N189 Chronic kidney disease, unspecified: Secondary | ICD-10-CM | POA: Insufficient documentation

## 2012-04-23 DIAGNOSIS — R0989 Other specified symptoms and signs involving the circulatory and respiratory systems: Secondary | ICD-10-CM | POA: Insufficient documentation

## 2012-04-23 DIAGNOSIS — Y840 Cardiac catheterization as the cause of abnormal reaction of the patient, or of later complication, without mention of misadventure at the time of the procedure: Secondary | ICD-10-CM | POA: Insufficient documentation

## 2012-04-23 DIAGNOSIS — E785 Hyperlipidemia, unspecified: Secondary | ICD-10-CM | POA: Insufficient documentation

## 2012-04-23 DIAGNOSIS — I129 Hypertensive chronic kidney disease with stage 1 through stage 4 chronic kidney disease, or unspecified chronic kidney disease: Secondary | ICD-10-CM | POA: Insufficient documentation

## 2012-04-23 DIAGNOSIS — E119 Type 2 diabetes mellitus without complications: Secondary | ICD-10-CM | POA: Insufficient documentation

## 2012-04-23 DIAGNOSIS — Z8673 Personal history of transient ischemic attack (TIA), and cerebral infarction without residual deficits: Secondary | ICD-10-CM | POA: Insufficient documentation

## 2012-04-23 DIAGNOSIS — Y929 Unspecified place or not applicable: Secondary | ICD-10-CM | POA: Insufficient documentation

## 2012-04-23 DIAGNOSIS — Z87891 Personal history of nicotine dependence: Secondary | ICD-10-CM | POA: Insufficient documentation

## 2012-04-23 DIAGNOSIS — I200 Unstable angina: Secondary | ICD-10-CM

## 2012-04-23 DIAGNOSIS — R0609 Other forms of dyspnea: Secondary | ICD-10-CM | POA: Insufficient documentation

## 2012-04-23 DIAGNOSIS — I252 Old myocardial infarction: Secondary | ICD-10-CM | POA: Insufficient documentation

## 2012-04-23 DIAGNOSIS — E669 Obesity, unspecified: Secondary | ICD-10-CM | POA: Insufficient documentation

## 2012-04-23 LAB — POCT I-STAT GLUCOSE: Operator id: 194801

## 2012-04-23 SURGERY — JV LEFT HEART CATHETERIZATION WITH CORONARY ANGIOGRAM
Anesthesia: Moderate Sedation

## 2012-04-23 MED ORDER — SODIUM CHLORIDE 0.9 % IJ SOLN: 3.0000 mL | INTRAMUSCULAR | Status: DC | PRN

## 2012-04-23 MED ORDER — SODIUM CHLORIDE 0.9 % IV SOLN
INTRAVENOUS | Status: DC
  Administered 2012-04-23: 12:00:00 via INTRAVENOUS

## 2012-04-23 MED ORDER — SODIUM CHLORIDE 0.9 % IV SOLN: 250.0000 mL | INTRAVENOUS | Status: DC | PRN

## 2012-04-23 MED ORDER — SODIUM CHLORIDE 0.9 % IV SOLN: INTRAVENOUS | Status: DC

## 2012-04-23 MED ORDER — ASPIRIN 81 MG PO CHEW
324.0000 mg | CHEWABLE_TABLET | ORAL | Status: AC
  Administered 2012-04-23: 324 mg via ORAL

## 2012-04-23 MED ORDER — SODIUM CHLORIDE 0.9 % IJ SOLN: 3.0000 mL | Freq: Two times a day (BID) | INTRAMUSCULAR | Status: DC

## 2012-04-23 MED ORDER — ACETAMINOPHEN 325 MG PO TABS: 650.0000 mg | ORAL_TABLET | ORAL | Status: DC | PRN

## 2012-04-23 MED ORDER — ONDANSETRON HCL 4 MG/2ML IJ SOLN: 4.0000 mg | Freq: Four times a day (QID) | INTRAMUSCULAR | Status: DC | PRN

## 2012-04-23 NOTE — CV Procedure (Signed)
   Cardiac Catheterization Procedure Note  Name: [name] MRN: [id number] DOB: February 13, 1956  Procedure: Coronary Angiography  Indication: Exertional dyspnea, abnormal stress test (intermediate risk).    Procedural Details: The right wrist was prepped, draped, and anesthetized with 1% lidocaine. Using the modified Seldinger technique, a 5 French sheath was introduced into the right radial artery. 3 mg of verapamil was administered through the sheath, weight-based unfractionated heparin was administered intravenously. Standard Judkins catheters were used for selective coronary angiography. The RCA was not injected as it was known to be totally occluded proximally and we were attempting to conserve contrast with CKD.  Catheter exchanges were performed over an exchange length guidewire. There were no immediate procedural complications. A TR band was used for radial hemostasis at the completion of the procedure.  The patient was transferred to the post catheterization recovery area for further monitoring.  Procedural Findings: Hemodynamics: AO 109/68  Coronary angiography: Coronary dominance: left  Left mainstem: Short vessel, no significant disease.   Left anterior descending (LAD): 50% stenosis in the proximal LAD after a small, diffusely diseased 1st diagonal and the first septal perforator.  The remainder of the LAD had diffuse luminal irregularities.   Left circumflex (LCx): Dominant vessel.  80% ostial stenosis in a moderate 1st OM.   There was a long stented segment in the mid LCx extending into a PLOM.  The PLOM was totally occluded ostially with collaterals filling the distal vessel from OM1.  There was an area of 70% mid LCx in-stent restenosis in the LCx stent proximal to the PLOM origin. There was 80% in-stent restenosis in the mid LCx stent just distal to the PLOM origin.  The was a small left-sided PDA supplied by the AV LCx beyond the occluded PLOM.   Right coronary artery (RCA):  Known to be totally occluded proximally from prior caths.  Relatively small, nondominant vessel.  There were some left to right collaterals. Not injected to conserve contrast.   Left ventriculography: Not done due to CKD.  Final Conclusions:  There is diffuse disease in the LCx system.  Since last procedure, the stent in the PLOM has totally occluded beginning at the ostium with collaterals to the distal vessel.  There is significant in-stent restenosis in the AV LCx in 2 areas.  The territory supplied by the AV LCx beyond the stenoses is relatively small, mainly comprised of a small left-sided PDA.  There is a 50% proximal LAD stenosis that does not appear to be hemodynamically significant.  The RCA is a small vessel known to be occluded proximally.   Recommendations: Difficult situation.  Myoview findings may be related to occlusion of the PLOM.  The AV LCx would be a potential interventional target but the territory supplied is not extensive and would have to deploy a stent within a pre-existing stent.  Plan medical management for the time being.    I will rework his medical regimen.  Given CKD, will stop HCTZ and losartan for now.  Will also stop metoprolol given potential to interact with his severe COPD.  I will use bisoprolol 5 mg daily, amlodipine 2.5 mg daily, and Imdur 30 mg daily.  He will continue other meds. I will need to see him back in 2 wks.   [name] 04/23/2012, 1:10 PM

## 2012-04-23 NOTE — Interval H&P Note (Signed)
History and Physical Interval Note:  04/23/2012 12:32 PM  [name]  has presented today for surgery, with the diagnosis of abnormal myoview and chest pain  The various methods of treatment have been discussed with the patient and family. After consideration of risks, benefits and other options for treatment, the patient has consented to  Procedure(s) (LRB) with comments: JV LEFT HEART CATHETERIZATION WITH CORONARY ANGIOGRAM (N/A) as a surgical intervention .  The patient's history has been reviewed, patient examined, no change in status, stable for surgery.  I have reviewed the patient's chart and labs.  Questions were answered to the patient's satisfaction.     Jaslene Marsteller [employer]

## 2012-04-23 NOTE — Progress Notes (Signed)
Allen's test performed on right hand with positive results, spo2 98%.

## 2012-04-23 NOTE — H&P (View-Only) (Signed)
Patient ID: [name], male   DOB: 1956-03-11, 56 y.o.   MRN: [id number] PCP: Dr. Sherrie Mustache  56 yo with h/o HTN, DM, hyperlipidemia, and smoking who developed NSTEMI in 6/10 returns for followup.  At that time, he was found to have a totally occluded mid RCA (known from prior studies) and a tight distal  CFX stenosis.  A drug-eluting stent was placed in the distal CFX, which was though to be the infarct-related lesion.  EF was preserved on left ventriculogram.  Patient had a lung mass removed in 2011.  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  He has significant COPD followed by Dr. Marchelle Gearing.   Since I last saw him, he has gradually become more short of breath with exertion.  He is dyspneic after walking 100 yards, with sweeping or mopping, and with steps.  He has had 2 episodes in the last few months of severe chest tightness.  The first happened while carrying a load of paper down steps.  He became very short of breath with chest tightness and felt his heart racing.  He almost passed out and actually tripped and fell down the steps.  The second episode occurred again while carrying a heavy load of paper in his office.  He developed chest tightness that was rather severe.  Besides these episodes, he has occasional atypical chest pain.  He is also limited by chronic low back pain.  He is planning to file for disability.   Labs (6/10):  Creatinine 1.48, K normal Labs (5/11): K 4, creatinine 1.6, LDL 53, HDL 30 Labs (8/13): LDL 73, HDL 26, K 4.5, creatinine 4.09  ECG: NSR, normal  Allergies (verified):  1)  ! Pcn  Family History: Family history of stroke.   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  Single  Tobacco Use - Former smoker.  Quit in May 2011.  1ppd x 38 yrs Alcohol Use - yes Regular Exercise - yes Drug Use - no  Past Medical History: 1.  CAD:  PCI 3/08 to OM2 and mid CFX.   NSTEMI 6/10.  Prior stents patent.  90% distal CFX, totally occluded mRCA (old) with collaterals.  EF 55% on LV-gram.  Xience DES 2.5 x 23 mm to distal CFX.  Echo (7.12): EF 55-60%, grade I diastolic dysfunction, normal RV size and systolic function.  2.  DM2 3.  Hyperlipidemia 4.  COPD:   - quit tobacco 5/11.   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 Marchelle Gearing - Aug 2011 5.  CKD, last creatinine 1.6 6.  obesity 7..  History of CVA without residual deficits 8.  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  -  Rx for CAP end May 2011  - Persistent and PET positive - Aug 2011  - ENB Bx 02/06/2010 - indeterminate  - Onciummne lung cancer antigen panel - Negative  03/01/2010 (test of poor sensitivity)    - Culture with Mycobacterium kansasii   ROS: All systems reviewed and negative except as per HPI.   Current Outpatient Prescriptions  Medication Sig Dispense Refill  . albuterol (PROVENTIL) (2.5 MG/3ML) 0.083% nebulizer solution Take 3  mLs (2.5 mg total) by nebulization 4 (four) times daily.  75 mL  3  . atorvastatin (LIPITOR) 40 MG tablet TAKE  ONE TABLET BY MOUTH NIGHTLY AT BEDTIME  30 tablet  11  . clopidogrel (PLAVIX) 75 MG tablet Take 1 tablet (75 mg total) by mouth daily.  30 tablet  6  . gabapentin (NEURONTIN) 100 MG capsule Take 100 mg by mouth 3 (three) times daily.      Marland Kitchen glipiZIDE (GLUCOTROL) 10 MG tablet Take 10 mg by mouth 2 (two) times daily before a meal.        . hydrochlorothiazide (MICROZIDE) 12.5 MG capsule TAKE ONE CAPSULE BY MOUTH EVERY DAY  90 capsule  0  . ipratropium (ATROVENT HFA) 17 MCG/ACT inhaler Inhale 2 puffs into the lungs 2 (two) times daily.  1 Inhaler  3  . ipratropium (ATROVENT) 0.02 % nebulizer solution Take 2.5 mLs (500  mcg total) by nebulization 4 (four) times daily.  75 mL  3  . metoprolol tartrate (LOPRESSOR) 25 MG tablet Take 1 tablet (25 mg total) by mouth 2 (two) times daily.  60 tablet  0  . nitroGLYCERIN (NITROSTAT) 0.4 MG SL tablet Place 0.4 mg under the tongue every 5 (five) minutes as needed. May repeat for up to 3 doses.       . VENTOLIN HFA 108 (90 BASE) MCG/ACT inhaler INHALE 2 PUFFS EVERY 6 HOURS  AS NEEDED FOR WHEEZING AND SHORTNESS OFBREATH.  18 each  4  . losartan (COZAAR) 50 MG tablet Take 1 tablet (50 mg total) by mouth daily.  30 tablet  6    BP 116/58  Pulse 91  Ht 5\' 10"  (1.778 m)  Wt 237 lb (107.502 kg)  BMI 34.01 kg/m2 General:  Well developed, well nourished, in no acute distress.  Obese.  Neck:  Neck thick, no JVD. No masses, thyromegaly or abnormal cervical nodes. Lungs:  Distant breath sounds bilaterally.  Heart:  Non-displaced PMI, chest non-tender; regular rate and rhythm, S1, S2 without murmurs, rubs or gallops. Carotid upstroke normal, no bruit. Pedals normal pulses. No edema, no varicosities. Abdomen:  Bowel sounds positive; abdomen soft and non-tender without masses, organomegaly, or hernias noted. No hepatosplenomegaly. Extremities:  No clubbing or cyanosis. Neurologic:  Alert and oriented x 3. Psych:  Depressed affect.  Assessment/Plan:  1. CAD: Patient has had 2 episodes of exertional chest pain in recent months along with episodes of atypical chest pain.  His exertional dyspnea is worsening.  I will set him up for an ETT-myoview to assess for ischemia. Continue ASA, Plavix, ARB, metoprolol.  I am going to keep him on Plavix as he has had no problems with it and has had multiple stents.  I suspect that the stents in 2008 were 1st generation DES. I will have him stop olmesartan and use losartan 50 mg daily instead (wants cheaper medication).  2. Hyperlipidemia: LDL at goal (< 70) in 8/13. 3. Dyspnea: He does not appear volume overloaded on exam.  Most likely this is from  COPD, but as above plan on getting ETT-myoview.   [name] 04/01/2012

## 2012-05-10 ENCOUNTER — Telehealth: Payer: Self-pay | Admitting: *Deleted

## 2012-05-10 ENCOUNTER — Encounter: Payer: Self-pay | Admitting: Physician Assistant

## 2012-05-10 ENCOUNTER — Ambulatory Visit (INDEPENDENT_AMBULATORY_CARE_PROVIDER_SITE_OTHER): Payer: 59 | Admitting: Physician Assistant

## 2012-05-10 VITALS — BP 104/72 | HR 68 | Ht 70.0 in | Wt 244.0 lb

## 2012-05-10 DIAGNOSIS — R0602 Shortness of breath: Secondary | ICD-10-CM

## 2012-05-10 DIAGNOSIS — I2510 Atherosclerotic heart disease of native coronary artery without angina pectoris: Secondary | ICD-10-CM

## 2012-05-10 DIAGNOSIS — R079 Chest pain, unspecified: Secondary | ICD-10-CM

## 2012-05-10 DIAGNOSIS — N189 Chronic kidney disease, unspecified: Secondary | ICD-10-CM

## 2012-05-10 LAB — BASIC METABOLIC PANEL
BUN: 24 mg/dL — ABNORMAL HIGH (ref 6–23)
Creatinine, Ser: 1.6 mg/dL — ABNORMAL HIGH (ref 0.4–1.5)
GFR: 49.07 mL/min — ABNORMAL LOW (ref >60.00)
Glucose, Bld: 235 mg/dL — ABNORMAL HIGH (ref 70–99)
Potassium: 4.4 mEq/L (ref 3.5–5.1)

## 2012-05-10 NOTE — Telephone Encounter (Signed)
pt notified about lab results with verbal understanding  

## 2012-05-10 NOTE — Progress Notes (Signed)
[address], [address] [place], [place]  [address] Phone: [phone], Fax:  [phone]  Date:  05/10/2012   Name:  [name]   DOB:  Oct 09, 1955   MRN:  [id number]  PCP:  Clydell Hakim, MD  Primary Cardiologist:  Dr. [name]  Primary Electrophysiologist:  None    History of Present Illness: [name] is a 56 y.o. male who returns for follow up after recent heart catheterization.  He has a hx of CAD, HTN, DM, HL, and smoking.  He suffered NSTEMI in 6/10.  At that time, he was found to have a totally occluded mid RCA (known from prior studies) and a tight distal CFX stenosis. A drug-eluting stent was placed in the distal CFX, which was though to be the infarct-related lesion. EF was preserved on left ventriculogram.  Patient had a lung mass removed in 2011. 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 He has significant COPD followed by Dr. Marchelle Gearing.   Patient recently saw Dr. Shirlee Latch with complaints of DOE and severe chest tightness. Myoview 04/14/12 was intermediate risk and he was set up forLHC.  LHC 04/23/12:  pLAD 50%, oOM1 80%, long stented segment of the CFX extending into the PLOM with the PLOM totally occluded ostially, CFX 70% ISR, mCFX 80% ISR, pRCA chronically occluded. Medical therapy planned. Metoprolol was changed to bisoprolol. HCTZ and losartan were stopped due to chronic kidney disease. He was placed on amlodipine and isosorbide. Of note, AV CFX could be potential target for intervention.  However, the territory supplied was not extensive and a stent would have to be deployed within a pre-existing stent.  Patient overall is stable. He continues to note occasional chest pain with exertion. He also notes dyspnea with exertion. He describes class IIb-III symptoms. This is fairly chronic without change. He denies syncope, orthopnea, PND or edema. He feels that, overall, he may be slightly improved since the  medication adjustments. He seems to be quite frustrated by the results on his heart catheterization. He also seems to be somewhat confused about his medication changes. It is not certain if he is still taking Benicar or Cozaar. As noted, these medications were to be discontinued.  Labs (6/10): Creatinine 1.48, K normal  Labs (5/11): K 4, creatinine 1.6, LDL 53, HDL 30  Labs (8/13): LDL 73, HDL 26, K 4.5, creatinine 6.96 Labs (11/13):  K 4.2, creatinine 2.1 Labs (12/13):  K 3.9 => 4.5 => 4.2; creatinine 1.5 => 1.8 => 1.6, Hgb 12.5  Wt Readings from Last 3 Encounters:  05/10/12 244 lb (110.678 kg)  04/23/12 240 lb (108.863 kg)  04/23/12 240 lb (108.863 kg)     Past Medical History  Diagnosis Date  . CAD (coronary artery disease)     a. PCI 3/08 to OM2 and mid CFX; b.  NSTEMI 6/10:  Prior stents patent, 90% dCFX,  mRCA occl (old) with collats; EF 55% on LV-gram => Xience DES 2.5 x 23 mm to distal CFX;  c. abnl MV 12/13 => LHC 04/23/12:  pLAD 50%, oOM1 80%, long stented segment of the CFX extending into the PLOM with the PLOM totally occluded ostially, CFX 70% ISR, mCFX 80% ISR, pRCA chronically occluded. Med Rx planned  . Diabetes mellitus type II   . Hyperlipidemia   . COPD (chronic obstructive pulmonary disease)     Quit tobacco 09/2009; Gold Stage 2 with asthma component - FEV1 1.53 L/54%, 14% fev1 BD response, DLCO 54% -  July 2011; MM genotype 01/07/10; unable to afford spiriva and unwilling to try ics due to prior renal failure: state to Dr. Marchelle Gearing, Aug 2011; started on atrovent HFA fall 2011. no desturation on walk test Dec 2011  . CKD (chronic kidney disease)   . Obesity   . History of CVA (cerebrovascular accident)     Without residual defecits  . 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 no further rx    Current Outpatient Prescriptions  Medication Sig Dispense Refill  . albuterol (PROVENTIL) (2.5 MG/3ML) 0.083% nebulizer solution Take 3 mLs (2.5 mg total) by nebulization 4 (four) times daily.  75 mL  3  . amLODipine (NORVASC) 2.5 MG tablet       . atorvastatin (LIPITOR) 40 MG tablet TAKE  ONE TABLET BY MOUTH NIGHTLY AT BEDTIME  30 tablet  10  . BENICAR 20 MG tablet       . bisoprolol (ZEBETA) 5 MG tablet       . clopidogrel (PLAVIX) 75 MG tablet Take 1 tablet (75 mg total) by mouth daily.  30 tablet  6  . gabapentin (NEURONTIN) 100 MG capsule Take 100 mg by mouth 3 (three) times daily.      Marland Kitchen glipiZIDE (GLUCOTROL) 10 MG tablet Take 10 mg by mouth 2 (two) times daily before a meal.        . ipratropium (ATROVENT HFA) 17 MCG/ACT inhaler Inhale 2 puffs into the lungs 2 (two) times daily.  1 Inhaler  3  . ipratropium (ATROVENT) 0.02 % nebulizer solution Take 2.5 mLs (500 mcg total) by nebulization 4 (four) times daily.  75 mL  3  . isosorbide mononitrate (IMDUR) 30 MG 24 hr tablet       . losartan (COZAAR) 25 MG tablet       . metoprolol tartrate (LOPRESSOR) 25 MG tablet Take 1 tablet (25 mg total) by mouth 2 (two) times daily.  60 tablet  0  . nitroGLYCERIN (NITROSTAT) 0.4 MG SL tablet Place 0.4 mg under the tongue every 5 (five) minutes as needed. May repeat for up to 3 doses.       . VENTOLIN HFA 108 (90 BASE) MCG/ACT inhaler INHALE 2 PUFFS EVERY 6 HOURS  AS NEEDED FOR WHEEZING AND SHORTNESS OFBREATH.  18 each  4    Allergies: Allergies  Allergen Reactions  . Erythromycin   . Penicillins     Social History:  The patient  reports that he quit smoking about 2 years ago. He does not have any smokeless tobacco history on file. He reports that he does not drink alcohol or use illicit drugs.   ROS:  Please see the history of present illness.   He notes chronic cough and  wheezing.   All other systems reviewed and negative.   PHYSICAL EXAM: VS:  BP 104/72  Pulse 68  Ht 5\' 10"  (1.778 m)  Wt 244 lb (110.678 kg)  BMI 35.01 kg/m2 Well nourished, well developed, in no acute distress HEENT: normal Neck: no JVD Cardiac:  normal S1, S2; RRR; no murmur Lungs:  clear to auscultation bilaterally, no wheezing, rhonchi or rales Abd: soft, nontender, no hepatomegaly Ext: no edema; right wrist without hematoma  or bruit  Skin: warm and dry Neuro:  CNs 2-12 intact, no focal abnormalities noted  EKG:  NSR, HR 68, normal axis, NSSTTW changes      ASSESSMENT AND PLAN:  1. Coronary Artery Disease:   As noted, his LHC demonstrated a totally occluded stent in the PLOM since his previous procedure beginning at the ostium with collaterals to the distal vessel as well as significant ISR in AVCFX in 2 areas. Medical therapy is to be attempted. AVCFX is a potential target for intervention but the territory supplied is not extensive and a stent would have to be deployed within a pre-existing stent. Patient seems somewhat confused about his medications. It is uncertain whether or not he is still taking ARB. He will review his medications when he gets home and contact us. If if he is still taking an ARB, this can be discontinued and his amlodipine can be increased to 5 mg. Otherwise, continue current Rx.  Consider Ranolazine in the future.  Continue ASA, Plavix and statin.  2. Hyperlipidemia:  Continue statin.  3. COPD:  Managed by pulmonary.  4. Chronic Kidney Disease:  Check BMET today.   4. Disposition:  Follow up with Dr. [name] in 6 weeks.   Signed, Tereso Newcomer, PA-C  11:53 AM 05/10/2012

## 2012-05-10 NOTE — Patient Instructions (Addendum)
When you get home call office back to verify medications.   Today lab (BMET)  Schedule appointment to see Dr. Shirlee Latch in 4-6 weeks

## 2012-05-10 NOTE — Telephone Encounter (Signed)
Message copied by Tarri Fuller on Mon May 10, 2012  3:24 PM ------      Message from: [place], [place] T      Created: Mon May 10, 2012  3:16 PM       Creatinine stable.      Continue with current treatment plan.      Tereso Newcomer, PA-C  3:16 PM 05/10/2012

## 2012-05-14 ENCOUNTER — Other Ambulatory Visit: Payer: Self-pay | Admitting: *Deleted

## 2012-05-14 ENCOUNTER — Telehealth: Payer: Self-pay | Admitting: Internal Medicine

## 2012-05-14 MED ORDER — ALBUTEROL SULFATE (2.5 MG/3ML) 0.083% IN NEBU: 2.5000 mg | INHALATION_SOLUTION | Freq: Four times a day (QID) | RESPIRATORY_TRACT | Status: DC

## 2012-05-14 NOTE — Telephone Encounter (Signed)
Rx has been sent in. 

## 2012-06-06 ENCOUNTER — Other Ambulatory Visit: Payer: Self-pay | Admitting: Internal Medicine

## 2012-06-07 ENCOUNTER — Ambulatory Visit: Payer: 59 | Admitting: Cardiology

## 2012-06-23 ENCOUNTER — Ambulatory Visit: Payer: 59 | Admitting: Cardiology

## 2012-07-13 ENCOUNTER — Ambulatory Visit: Payer: 59 | Admitting: Cardiology

## 2012-07-28 ENCOUNTER — Other Ambulatory Visit: Payer: Self-pay | Admitting: Internal Medicine

## 2012-07-29 ENCOUNTER — Telehealth: Payer: Self-pay | Admitting: Internal Medicine

## 2012-07-29 NOTE — Telephone Encounter (Signed)
Spoke with patient, states he needs Ventolin sent to pharmacy. According to our records Rx was sent 07/28/12 to [facility] Patients states they do not have this Rx. Spoke with randy @ [facility] they do have Rx but with patients insurance he will not be able to pick it up until tomorrow.   Spoke back with patient, made him aware of this. Patient also wants to be seen earlier than 9 month follow up (for insurance purposes) Patient has been scheduled to see MR 08/30/12 at 945 am.  Nothing further needed at this time.

## 2012-07-30 ENCOUNTER — Other Ambulatory Visit: Payer: Self-pay

## 2012-08-02 ENCOUNTER — Ambulatory Visit (INDEPENDENT_AMBULATORY_CARE_PROVIDER_SITE_OTHER): Payer: 59 | Admitting: Nurse Practitioner

## 2012-08-02 ENCOUNTER — Encounter: Payer: Self-pay | Admitting: Nurse Practitioner

## 2012-08-02 VITALS — BP 100/64 | HR 72 | Resp 20 | Ht 70.0 in | Wt 234.8 lb

## 2012-08-02 DIAGNOSIS — I2510 Atherosclerotic heart disease of native coronary artery without angina pectoris: Secondary | ICD-10-CM

## 2012-08-02 DIAGNOSIS — E785 Hyperlipidemia, unspecified: Secondary | ICD-10-CM

## 2012-08-02 DIAGNOSIS — I209 Angina pectoris, unspecified: Secondary | ICD-10-CM

## 2012-08-02 DIAGNOSIS — I208 Other forms of angina pectoris: Secondary | ICD-10-CM

## 2012-08-02 DIAGNOSIS — I10 Essential (primary) hypertension: Secondary | ICD-10-CM

## 2012-08-02 MED ORDER — NITROGLYCERIN 0.4 MG SL SUBL: 0.4000 mg | SUBLINGUAL_TABLET | SUBLINGUAL | Status: DC | PRN

## 2012-08-02 NOTE — Patient Instructions (Addendum)
Call us tomorrow at [phone] with a complete list of your medications  Your physician recommends that you schedule a follow-up appointment in: one month with Dr Shirlee Latch

## 2012-08-02 NOTE — Progress Notes (Signed)
Patient Name: [name] Date of Encounter: 08/02/2012  Primary Care Provider:  Clydell Hakim, MD Primary Cardiologist:  Golden Circle, MD  Patient Profile  57 year old male with history of CAD who presents for followup today.  Problem List   Past Medical History  Diagnosis Date  . CAD (coronary artery disease)     a. PCI 3/08 to OM2 and mid CFX; b.  NSTEMI 6/10:  Prior stents patent, 90% dCFX,  mRCA occl (old) with collats; EF 55% on LV-gram => Xience DES 2.5 x 23 mm to distal CFX;  c. abnl MV 12/13 => LHC 04/23/12:  pLAD 50%, oOM1 80%, long stented segment of the CFX extending into the PLOM with the PLOM totally occluded ostially, CFX 70% ISR, mCFX 80% ISR, pRCA chronically occluded. Med Rx planned  . Diabetes mellitus type II   . Hyperlipidemia   . COPD (chronic obstructive pulmonary disease)     Quit tobacco 09/2009; Gold Stage 2 with asthma component - FEV1 1.53 L/54%, 14% fev1 BD response, DLCO 54% - July 2011; MM genotype 01/07/10; unable to afford spiriva and unwilling to try ics due to prior renal failure: state to Dr. Marchelle Gearing, Aug 2011; started on atrovent HFA fall 2011. no desturation on walk test Dec 2011  . CKD (chronic kidney disease)   . Obesity   . History of CVA (cerebrovascular accident)     Without residual defecits  . 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 no further rx   Past Surgical History  Procedure Laterality Date  . Wedge resection  03/28/2010  . Coronary stent placement     Allergies  Allergies  Allergen Reactions  . Erythromycin   . Penicillins    HPI  57 year old male with the above complex problem list.  He was last seen in clinic in  December following a catheterization which revealed occlusion of previously placed obtuse marginal stent with moderately severe in-stent restenosis within the previously placed left circumflex stents.  He was medically managed.  In December, he was doing reasonably well though there was some confusion about his medications as duplications were present on his list.  He was advised to call back with an accurate list, though it doesn't appear that ever took place.  Over the past few months, he has had stable, chronic dyspnea on exertion dating that he can usually walk somewhere between 20 and 100 feet prior to experiencing dyspnea.  He says this has not changed in greater than a year.  He has had occasional discomfort in his mid scapular region that occurs with emotional upset or higher levels of exertion.  This has also been stable over the past year.  He denies PND, orthopnea, dizziness, syncope, edema, or early satiety.  Home Medications  Prior to Admission medications   Medication Sig Start Date End Date Taking? Authorizing Provider  albuterol (PROVENTIL) (2.5 MG/3ML) 0.083% nebulizer solution Take 3 mLs (2.5 mg total) by nebulization 4 (four) times daily. 05/14/12 05/14/13 Yes Kalman Shan, MD  amLODipine (NORVASC) 2.5 MG tablet  04/23/12  Yes Historical Provider, MD  atorvastatin (LIPITOR) 40 MG tablet TAKE  ONE TABLET BY MOUTH NIGHTLY AT BEDTIME 04/12/12  Yes Laurey Morale,  MD  BENICAR 20 MG tablet  04/25/12  Yes Historical Provider, MD  bisoprolol (ZEBETA) 5 MG tablet  04/23/12  Yes Historical Provider, MD  clopidogrel (PLAVIX) 75 MG tablet Take 1 tablet (75 mg total) by mouth daily. 12/09/11  Yes Laurey Morale, MD  gabapentin (NEURONTIN) 100 MG capsule Take 100 mg by mouth 3 (three) times daily. 11/21/10 03/04/13 Yes Kalman Shan, MD  glipiZIDE (GLUCOTROL) 10 MG tablet Take 10 mg by mouth 2 (two) times daily before a meal.     Yes Historical Provider, MD  ipratropium (ATROVENT HFA) 17  MCG/ACT inhaler Inhale 2 puffs into the lungs 2 (two) times daily. 04/09/11  Yes Kalman Shan, MD  ipratropium (ATROVENT) 0.02 % nebulizer solution Take 2.5 mLs (500 mcg total) by nebulization 4 (four) times daily. 09/26/11 09/25/12 Yes Kalman Shan, MD  isosorbide mononitrate (IMDUR) 30 MG 24 hr tablet  04/23/12  Yes Historical Provider, MD  losartan (COZAAR) 25 MG tablet  04/05/12  Yes Historical Provider, MD  metoprolol tartrate (LOPRESSOR) 25 MG tablet Take 1 tablet (25 mg total) by mouth 2 (two) times daily. 02/13/12  Yes Laurey Morale, MD  nitroGLYCERIN (NITROSTAT) 0.4 MG SL tablet Place 0.4 mg under the tongue every 5 (five) minutes as needed. May repeat for up to 3 doses.   Yes Historical Provider, MD  VENTOLIN HFA 108 (90 BASE) MCG/ACT inhaler INHALE 2 PUFFS EVERY 6 HOURS  FOR SHORTNESS OF BREATH AND  WHEEZING 07/28/12  Yes Kalman Shan, MD    Review of Systems  Stable angina and dyspnea as outlined above.  All other systems reviewed and are otherwise negative except as noted above.  Physical Exam  Blood pressure 100/64, pulse 72, resp. rate 20, height 5\' 10"  (1.778 m), weight 234 lb 12.8 oz (106.505 kg).  General: Pleasant, NAD Psych: Normal affect. Neuro: Alert and oriented X 3. Moves all extremities spontaneously. HEENT: Normal  Neck: Supple without bruits or JVD. Lungs:  Resp regular and unlabored, somewhat diminished breath sounds throughout.Marland Kitchen Heart: RRR-distant, no s3, s4, or murmurs. Abdomen: Soft, non-tender, non-distended, BS + x 4.  Extremities: No clubbing, cyanosis or edema. DP/PT/Radials 2+ and equal bilaterally.  Assessment & Plan  1.  Stable angina/CAD: Patient has been experiencing intermittent mid scapular and chest discomfort associated with dyspnea with high levels of exertion and also emotional upset but says that symptoms have not changed in frequency or severity over the past year.  He underwent diagnostic catheterization in December with moderate  to severe in-stent restenosis in the left circumflex and also an occlusion of the recently stented the obtuse marginal.  Medical therapy was recommended.  We reviewed his medication list which shows 2 beta blockers as well as to 2 angiotensin receptor blockers.  He is not exactly sure what he is taking.I recommended that he call us back as soon as possible with an accurate list of his medications as outlined to remove any redundancy.  Ideally, given his lung disease, if he is actually taking 2 beta blockers, I would discontinue the metoprolol and stick with bisoprolol. I would also discontinue Benicar in favor of losartan given his limited financial resources.  In addition to these changes, I would titrate his Imdur to 60 mg daily.  I will wait to see what is actually taking.  2.  Hypertension: Blood pressure is actually trending low.  Would make changes as above.  3.  Hyperlipidemia:LDL was 34 in July 2012 and LFTs were normal at that time.  This is been followed by his primary care provider in the interim.  Continue statin therapy.  4. diabetes mellitus:  Followed by primary care.  5.  Stage III chronic kidney disease:  As above, we need to determine which ARB he is actually taking.  6.  Disposition: Patient has been advised to call back tomorrow to complete list of his medications.  He will need followup with Dr. Jearld Pies within the next 3-4 weeks to reevaluate symptoms after medication changes and determine whether or not he will require attempt at intervention for in-stent restenosis in the left circumflex    Nicolasa Ducking, NP 08/02/2012, 9:03 AM

## 2012-08-03 ENCOUNTER — Telehealth: Payer: Self-pay | Admitting: Cardiology

## 2012-08-03 NOTE — Telephone Encounter (Signed)
New problem   Pt came in office yesterday 08/03/11 and was told by Dr Shirlee Latch to call back and give list of his medication: they are as following. Isosorb 30mg  Netformin  750mg  Benicar  20mg  Atorvastatin 40mg  Disoprol 5mg  Clopidogrel 75mg  Glipizide 10mg  Amlodipine 2.5mg  Asprin

## 2012-08-03 NOTE — Telephone Encounter (Signed)
Reviewed NP Berge's note.  Increase Imdur to 60 mg daily To limit expenses, would stop olmesartan and start losartan 50 mg daily.

## 2012-08-03 NOTE — Telephone Encounter (Signed)
Spoke with patient. He was driving and said he would call me back tomorrow.

## 2012-08-03 NOTE — Telephone Encounter (Signed)
I will forward to Dr Shirlee Latch to review [place] note from 08/02/12 and updated list of medications.

## 2012-08-03 NOTE — Telephone Encounter (Signed)
Reviewed and updated meds  with patient.

## 2012-08-10 MED ORDER — LOSARTAN POTASSIUM 50 MG PO TABS: 50.0000 mg | ORAL_TABLET | Freq: Every day | ORAL | Status: DC

## 2012-08-10 MED ORDER — ISOSORBIDE MONONITRATE ER 60 MG PO TB24: 60.0000 mg | ORAL_TABLET | Freq: Every day | ORAL | Status: DC

## 2012-08-10 NOTE — Telephone Encounter (Signed)
Pt called to discuss the medications change. Prescription for Imdur 60 mg and Losartan 50 mg once daily sent to [facility] pharmacy. Pt aware.

## 2012-08-10 NOTE — Telephone Encounter (Signed)
Follow-up:    Called in wanting to discuss a medication change.  Please call back.

## 2012-08-13 ENCOUNTER — Telehealth: Payer: Self-pay | Admitting: Cardiology

## 2012-08-13 NOTE — Telephone Encounter (Signed)
New prescriptions were sent to [facility] in [place].

## 2012-08-13 NOTE — Telephone Encounter (Signed)
Spoke with patient about medication changes recommended by Dr Shirlee Latch recently.

## 2012-08-13 NOTE — Telephone Encounter (Signed)
New problem   Pt was returning a call about a medication change that was made by Dr Shirlee Latch. Please call pt concerning this matter.

## 2012-08-30 ENCOUNTER — Ambulatory Visit: Payer: 59 | Admitting: Internal Medicine

## 2012-09-03 ENCOUNTER — Telehealth: Payer: Self-pay | Admitting: Cardiology

## 2012-09-03 NOTE — Telephone Encounter (Signed)
New problem   Pt would like to switch Atorvastatin to Zocor when his next refill is due. Please call pt

## 2012-09-03 NOTE — Telephone Encounter (Signed)
When patient picked up Rx today pharmacist told him Simvastatin was less expensive and patient would like to change to that. Will forward to Dr Shirlee Latch for review. Patient aware it will be sometime next week before a call back, has a 30 day supply.

## 2012-09-04 NOTE — Telephone Encounter (Signed)
Simvastatin is not as effective a medication as atorvastatin and also will interact with amlodipine, which he is also taking.  Would be better to stay with atorvastatin.

## 2012-09-06 NOTE — Telephone Encounter (Signed)
LMTCB

## 2012-09-07 ENCOUNTER — Ambulatory Visit: Payer: 59 | Admitting: Cardiology

## 2012-09-07 ENCOUNTER — Other Ambulatory Visit: Payer: Self-pay | Admitting: Internal Medicine

## 2012-09-07 NOTE — Telephone Encounter (Signed)
Pt advised,verbalized understanding. 

## 2012-09-29 ENCOUNTER — Telehealth: Payer: Self-pay | Admitting: Cardiology

## 2012-09-29 NOTE — Telephone Encounter (Signed)
We do not do PFTs here.

## 2012-09-29 NOTE — Telephone Encounter (Signed)
New problem   Disability want to know if pt can have a PFT study done. Please call

## 2012-10-05 ENCOUNTER — Encounter: Payer: Self-pay | Admitting: Cardiology

## 2012-10-06 ENCOUNTER — Encounter: Payer: Self-pay | Admitting: Internal Medicine

## 2012-10-06 ENCOUNTER — Ambulatory Visit (INDEPENDENT_AMBULATORY_CARE_PROVIDER_SITE_OTHER): Payer: BC Managed Care – PPO | Admitting: Internal Medicine

## 2012-10-06 VITALS — BP 126/84 | HR 97 | Ht 70.0 in | Wt 241.0 lb

## 2012-10-06 DIAGNOSIS — J449 Chronic obstructive pulmonary disease, unspecified: Secondary | ICD-10-CM

## 2012-10-06 DIAGNOSIS — J4489 Other specified chronic obstructive pulmonary disease: Secondary | ICD-10-CM

## 2012-10-06 NOTE — Patient Instructions (Addendum)
Copd appears stable Start breo 1 puff daily; learn technique  - Call for samples in future Use atroven nebs as needed REturn in 6 months or sooner if needed  - CAT score at followup

## 2012-10-06 NOTE — Progress Notes (Signed)
Subjective:    Patient ID: [name], male    DOB: 12-28-1955, 57 y.o.   MRN: [id number]  HPI 1. 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 - Clinical worsening to stage 4 Fev1 to 1.34L/37%, Ratio 49 in setting of mdi refusal ($$) - Nov 2012 - No desaturation on submaximal exertion Nov 2012 - CAT score at baseline (without med Rx) : 26  - Ma 2013  2. Ex-smoker - quit may 2011  3. Class 2-3 dyspnea but pre-op VO2 max 87ml/kg/min - CPST 02/01/2010  4. RUL PET Hot interemediate prob nodule, ENB non-diagnostic 9/28/20111. Culture: mycobacterium kansasii. Not on drug Rx  5. 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 Metabolic Syndrome with Obesitywith ESS - 3 in June 2012  7.Compliance with meds: Cost issues with MDI v reluctance to take MDI  - June 2012 - reluctant - Dec 2012 - samples given, new insurance + but still not buying mdi due to out of pocket cost   OV 09/26/2011 Six month followup for all the above multiple issues.  Feels stable.  Denies AECOPD symptoms  Off mdi - cost issues. Unable to afford even $50 per mdi.  Dysoneic with climibing 1 flight and vacuuming at house since coming off mdi Wheeiy at work env due to fabric exposure and carrying boxes No aecopd acute deterioration All symptooms above are chronic Off gabpentin too due to cost and fact pain is better  CAT COPD Symptom and Quality of Life Score - reflects below a score of 26 and high symptom burden of copd placing him in high risk category with dyspnea and cough/fatigue main issues  REC   #Obstructive lung disease  - Too bad cost is an issue  - in order to combat cost we can try nebulizer treatment; might be cheaper  - take albuterol and atrovent generic nebulizer treatment 4 times daily with a nebulizer machine  - take spiriva till we get nebulizer  - no need to take symbicort due to cost  #Neuropathic pain  - Glad you are better and off gabapentin   #Weight and diabetes  - avoid rice, breads, pastas, sugars, desserts, corn  - eat more vegetables   OV 03/04/2012   Followup COPD and obesity   COPD: stable. CAT score 26 and stble (see below). Past few days increased hoarseness of voice without change in cough, dyspnea or sputum. Says is due to weather change. Insists he is not in aECOPD. REfuses COPD flare Rx even though his exam showed wheeze. Says nebs working out better for him from $ standpiint but doing atrovent neb only bid due to work issues. Doing alb prn. REfuses neb steroids due to cost. Trying to get disability but says he needs to be laid off to apply for disability and cannot afford being laid off. Yet unhappy with work; they work him too hard despite his copd and give him bad insurance and give him hard time to take time off to visit MD  Weight: rviewed his diet. Lost 8# in 1 year but I noted he hardly eats and what he eats is mostly bread. Never eats nuts. Rarely eats vegetables.    REC  #Followup  - 9 months or sooner if needed  #COPD  - continue your medications  - you are wheezing but at your request will hold off treating this as copd flare because you feel this is because  of missed medications today  - we will give you sample of ventolin HFA if we have it or pro-air hfa  - CMA will ensure refills  - glad you haad flu shot  - we discussed rehab again but understand at this time due to work issues you cannot attend it  CAT score and walk test at followup  OV 10/06/2012  Followup for COPD.  Last seen October 2013. Since then COPD stable. No reports of exacerbations. COPD cat score is 24 and is on baseline. He is using Atrovent nebulizer at home. His main issues are not COPD but other medical and financial issues reviewed in past medical history below.     CAT COPD Symptom and Quality of Life Score ([employer]) 5/71/13 03/04/2012  10/06/2012   Never Cough -> Cough all the time 3 3 4   No  phlegm in chest -> Chest is full of phlegm 3 4 4   No chest tightness -> Chest feels very tight 2 3 2   No dyspnea for 1 flight stairs/hill -> Very dyspneic for 1 flight of stairs 4 5 5   No limitations for ADL at home -> Very limited with ADL at home 5 5 3   Confident leaving home -> Not at all confident leaving home 2 1 1   Sleep soundly -> Do not sleep soundly because of lung condition 4 2 2   Lots of Energy -> No energy at all 3 3 3   TOTAL Score (max 40)  26 26 24      Past, Family, Social reviewed: dec 2013 has had stent blockage and is on medical Rx. In march 2014: skin cancer and s/p exicsion with complete remission. Because of this lost job in march 2014. Now trying to get disability. Out of $ and dependent on friends/family.Is on Obama-care but says is bad for medications   Review of Systems  Constitutional: Negative for fever and unexpected weight change.  HENT: Negative for ear pain, nosebleeds, congestion, sore throat, rhinorrhea, sneezing, trouble swallowing, dental problem, postnasal drip and sinus pressure.   Eyes: Negative for redness and itching.  Respiratory: Positive for cough and shortness of breath. Negative for chest tightness and wheezing.   Cardiovascular: Negative for palpitations and leg swelling.  Gastrointestinal: Negative for nausea and vomiting.  Genitourinary: Negative for dysuria.  Musculoskeletal: Negative for joint swelling.  Skin: Negative for rash.  Neurological: Negative for headaches.  Hematological: Does not bruise/bleed easily.  Psychiatric/Behavioral: Negative for dysphoric mood. The patient is not nervous/anxious.    Current outpatient prescriptions:albuterol (PROVENTIL) (2.5 MG/3ML) 0.083% nebulizer solution, Take 3 mLs (2.5 mg total) by nebulization 2 (two) times daily., Disp: 75 mL, Rfl: 3;  amLODipine (NORVASC) 2.5 MG tablet, Take 1 tablet (2.5 mg total) by mouth daily., Disp: , Rfl: ;  aspirin 81 MG tablet, Take 1 tablet (81 mg total) by mouth  daily., Disp: 30 tablet, Rfl: 0 atorvastatin (LIPITOR) 40 MG tablet, TAKE  ONE TABLET BY MOUTH NIGHTLY AT BEDTIME, Disp: 30 tablet, Rfl: 10;  bisoprolol (ZEBETA) 5 MG tablet, Take 1 tablet (5 mg total) by mouth daily., Disp: , Rfl: ;  clopidogrel (PLAVIX) 75 MG tablet, Take 1 tablet (75 mg total) by mouth daily., Disp: , Rfl: ;  glipiZIDE (GLUCOTROL) 10 MG tablet, Take 10 mg by mouth 2 (two) times daily before a meal.  , Disp: , Rfl:  ipratropium (ATROVENT) 0.02 % nebulizer solution, Take 2.5 mLs (500 mcg total) by nebulization 4 (four) times daily., Disp: 75 mL, Rfl:  3;  isosorbide mononitrate (IMDUR) 60 MG 24 hr tablet, Take 1 tablet (60 mg total) by mouth daily., Disp: 30 tablet, Rfl: 8;  losartan (COZAAR) 50 MG tablet, Take 1 tablet (50 mg total) by mouth daily., Disp: 30 tablet, Rfl: 8;  metFORMIN (GLUCOPHAGE-XR) 750 MG 24 hr tablet, 2 tablets daily, Disp: , Rfl:  nitroGLYCERIN (NITROSTAT) 0.4 MG SL tablet, Place 1 tablet (0.4 mg total) under the tongue every 5 (five) minutes as needed. May repeat for up to 3 doses., Disp: 25 tablet, Rfl: 3;  VENTOLIN HFA 108 (90 BASE) MCG/ACT inhaler, INHALE 2 PUFFS EVERY 6 HOURS   FOR SHORTNESS OF BREATH AND  WHEEZING, Disp: 18 each, Rfl: 1     Objective:   Physical Exam Nursing note and vitals reviewed.  Constitutional: He is oriented to person, place, and time. He appears well-developed and well-nourished. No distress.  obese  Filed Weights   03/04/12 1639  Weight: 242 lb 12.8 oz (110.133 kg)   Filed Vitals:   10/06/12 1704  Height: 5\' 10"  (1.778 m)  Weight: 241 lb (109.317 kg)    HENT:  Head: Normocephalic and atraumatic.  Right Ear: External ear normal.  Left Ear: External ear normal.  Mouth/Throat: Oropharynx is clear and moist. No oropharyngeal exudate.  Eyes: Conjunctivae and EOM are normal. Pupils are equal, round, and reactive to light. Right eye exhibits no discharge. Left eye exhibits no discharge. No scleral icterus.  Neck: Normal range  of motion. Neck supple. No JVD present. No tracheal deviation present. No thyromegaly present.  Cardiovascular: Normal rate, regular rhythm and intact distal pulses. Exam reveals no gallop and no friction rub.  No murmur heard.  Pulmonary/Chest: Effort normal and breath sounds normal. No respiratory distress. He has wheezes which is a change but insists this is not copd flare and is because of missed medications. He has no rales. He exhibits no tenderness.  No longer Tender in areas of scar of prior surgery  Abdominal: Soft. Bowel sounds are normal. He exhibits no distension and no mass. There is no tenderness. There is no rebound and no guarding.  Musculoskeletal: Normal range of motion. He exhibits no edema and no tenderness.  Lymphadenopathy:  He has no cervical adenopathy.  Neurological: He is alert and oriented to person, place, and time. He has normal reflexes. No cranial nerve deficit. Coordination normal.  Skin: Skin is warm and dry. No rash noted. He is not diaphoretic. No erythema. No pallor.  Psychiatric: FLAT AFFECT as before. His behavior is normal. Judgment and thought content normal.             Assessment & Plan:

## 2012-10-08 NOTE — Assessment & Plan Note (Signed)
Copd appears stable Start breo 1 puff daily; learn technique  - Call for samples in future Use atroven nebs as needed REturn in 6 months or sooner if needed  - CAT score at followup  

## 2012-10-15 NOTE — Telephone Encounter (Signed)
Will send message to Dr.McLean for response then call back.

## 2012-10-15 NOTE — Telephone Encounter (Signed)
F/u   Disability does not want [place] to do the study, they want to make sure that it's ok for them to set the study up for them to do

## 2012-10-17 NOTE — Telephone Encounter (Signed)
That would be fine for them to do PFTs and fax to [place].

## 2012-10-19 NOTE — Telephone Encounter (Signed)
LMTCB mobile

## 2012-11-11 ENCOUNTER — Ambulatory Visit (INDEPENDENT_AMBULATORY_CARE_PROVIDER_SITE_OTHER): Payer: BC Managed Care – PPO | Admitting: Cardiology

## 2012-11-11 ENCOUNTER — Encounter: Payer: Self-pay | Admitting: Cardiology

## 2012-11-11 VITALS — BP 120/80 | HR 79 | Ht 70.0 in | Wt 240.0 lb

## 2012-11-11 DIAGNOSIS — E785 Hyperlipidemia, unspecified: Secondary | ICD-10-CM

## 2012-11-11 DIAGNOSIS — I2584 Coronary atherosclerosis due to calcified coronary lesion: Secondary | ICD-10-CM

## 2012-11-11 DIAGNOSIS — I2510 Atherosclerotic heart disease of native coronary artery without angina pectoris: Secondary | ICD-10-CM

## 2012-11-11 DIAGNOSIS — I10 Essential (primary) hypertension: Secondary | ICD-10-CM

## 2012-11-11 DIAGNOSIS — J441 Chronic obstructive pulmonary disease with (acute) exacerbation: Secondary | ICD-10-CM

## 2012-11-11 LAB — BASIC METABOLIC PANEL
Chloride: 103 mEq/L (ref 96–112)
GFR: 45.59 mL/min — ABNORMAL LOW (ref >60.00)
Glucose, Bld: 126 mg/dL — ABNORMAL HIGH (ref 70–99)
Potassium: 4.1 mEq/L (ref 3.5–5.1)
Sodium: 138 mEq/L (ref 135–145)

## 2012-11-11 LAB — LIPID PANEL
LDL Cholesterol: 72 mg/dL (ref 0–99)
VLDL: 25 mg/dL (ref 0.0–40.0)

## 2012-11-11 NOTE — Patient Instructions (Addendum)
Your physician recommends that you have lab work today--BMET/Lipid profile   Your physician wants you to follow-up in: 6 months with Dr Shirlee Latch. (January 2015).  You will receive a reminder letter in the mail two months in advance. If you don't receive a letter, please call our office to schedule the follow-up appointment.

## 2012-11-11 NOTE — Progress Notes (Signed)
Patient ID: [name], male   DOB: Apr 03, 1956, 57 y.o.   MRN: [id number] PCP: Dr. Sherrie Mustache  57 yo with h/o HTN, DM, hyperlipidemia, and smoking who developed NSTEMI in 6/10 returns for followup.  At that time, he was found to have a totally occluded mid RCA (known from prior studies) and a tight distal  CFX stenosis.  A drug-eluting stent was placed in the mid CFX, which was though to be the infarct-related lesion.  EF was preserved on left ventriculogram.  Patient had a lung mass removed in 2011.  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  He has significant COPD followed by Dr. Marchelle Gearing.   In 12/13, he had begun to develop increased exertional chest pain.  I took him for [facility] in 12/13, showing total occlusion of the PLOM with collaterals.  There was 80% in-stent restenosis in the mid LCx, but the territory supplied by the LCx beyond the occluded PLOM was small so the patient was medically managed.  I increased his anti-anginal regimen.  Since that time, his chest pain has been much improved.  He has occasional chest tightness with heavy exertion, maybe once a month.  He has stable chronic exertional dyspnea after walking 100-200 feet.  He is now on disability.  He is limited by low back pain as well.  Patient reports significant daytime sleepiness. Weight is up 6 lbs.    Labs (6/10):  Creatinine 1.48, K normal Labs (5/11): K 4, creatinine 1.6, LDL 53, HDL 30 Labs (8/13): LDL 73, HDL 26, K 4.5, creatinine 4.09 Labs (12/13): K 4.4, creatinine 1.6  ECG: NSR, normal  Allergies (verified):  1)  ! Pcn  Family History: Family history of stroke.   Social History: Out of work on disability.  Single  Tobacco Use - Former smoker.  Quit in May 2011.  1ppd x 38 yrs Alcohol Use - yes Regular Exercise - yes Drug Use - no  Past Medical History: 1.  CAD:  PCI 3/08 to OM2 and mid CFX.  NSTEMI 6/10.  Prior stents patent.  90% distal CFX, totally occluded mRCA (old)  with collaterals.  EF 55% on LV-gram.  Xience DES 2.5 x 23 mm to mid CFX.  Echo (7/12): EF 55-60%, grade I diastolic dysfunction, normal RV size and systolic function. LHC (12/13) with 50% pLAD, 80% ostial OM1, total occlusion of the PLOM with collaterals, 80% mLCx in-stent restenosis.  Medical management as the territory beyond the mid LCx was not extensive since the [facility] was occluded.  2.  Dyslipidemia 3.  Hyperlipidemia 4.  COPD:   - quit tobacco 5/11.   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 Marchelle Gearing - Aug 2011 5.  CKD, last creatinine 1.6 6.  obesity 7..  History of CVA without residual deficits 8.  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  -  Rx for CAP end May 2011  - Persistent and PET positive - Aug 2011  - ENB Bx 02/06/2010 - indeterminate  - Onciummne lung cancer antigen panel - Negative  03/01/2010 (test of poor sensitivity)    - Culture with Mycobacterium kansasii   ROS: All systems reviewed and negative except as per HPI.   Current Outpatient  Prescriptions  Medication Sig Dispense Refill  . albuterol (PROVENTIL) (2.5 MG/3ML) 0.083% nebulizer solution Take 3 mLs (2.5 mg total) by nebulization 2 (two) times daily.  75 mL  3  . amLODipine (NORVASC) 2.5 MG tablet Take 1 tablet (2.5 mg total) by mouth daily.      Marland Kitchen aspirin 81 MG tablet Take 1 tablet (81 mg total) by mouth daily.  30 tablet  0  . atorvastatin (LIPITOR) 40 MG tablet TAKE  ONE TABLET BY MOUTH NIGHTLY AT BEDTIME  30 tablet  10  . bisoprolol (ZEBETA) 5 MG tablet Take 1 tablet (5 mg total) by mouth daily.      . clopidogrel (PLAVIX) 75 MG tablet Take 1 tablet (75 mg total) by mouth daily.      Marland Kitchen glipiZIDE (GLUCOTROL) 10 MG tablet Take 10 mg by mouth 2 (two) times daily before a  meal.        . ipratropium (ATROVENT) 0.02 % nebulizer solution Take 2.5 mLs (500 mcg total) by nebulization 4 (four) times daily.  75 mL  3  . isosorbide mononitrate (IMDUR) 60 MG 24 hr tablet Take 1 tablet (60 mg total) by mouth daily.  30 tablet  8  . losartan (COZAAR) 50 MG tablet Take 1 tablet (50 mg total) by mouth daily.  30 tablet  8  . metFORMIN (GLUCOPHAGE-XR) 750 MG 24 hr tablet 2 tablets daily      . nitroGLYCERIN (NITROSTAT) 0.4 MG SL tablet Place 1 tablet (0.4 mg total) under the tongue every 5 (five) minutes as needed. May repeat for up to 3 doses.  25 tablet  3  . VENTOLIN HFA 108 (90 BASE) MCG/ACT inhaler INHALE 2 PUFFS EVERY 6 HOURS   FOR SHORTNESS OF BREATH AND  WHEEZING  18 each  1   No current facility-administered medications for this visit.    BP 120/80  Pulse 79  Ht 5\' 10"  (1.778 m)  Wt 240 lb (108.863 kg)  BMI 34.44 kg/m2 General:  Well developed, well nourished, in no acute distress.  Obese.  Neck:  Neck thick, no JVD. No masses, thyromegaly or abnormal cervical nodes. Lungs:  Distant breath sounds bilaterally.  Heart:  Non-displaced PMI, chest non-tender; regular rate and rhythm, S1, S2 without murmurs, rubs or gallops. Carotid upstroke normal, no bruit. Pedals normal pulses. No edema, no varicosities. Abdomen:  Bowel sounds positive; abdomen soft and non-tender without masses, organomegaly, or hernias noted. No hepatosplenomegaly. Extremities:  No clubbing or cyanosis. Neurologic:  Alert and oriented x 3. Psych:  Depressed affect.  Assessment/Plan:  1. CAD: Medical management after last cath in 12/13.  Angina mostly resolved on current regimen of amlodipine, Imdur, and bisoprolol.  Continue current anti-anginal regimen.  I will also continue Plavix, ASA 81, and atorvastatin.  2. Hyperlipidemia: Check lipids today.  3. Dyspnea: He does not appear volume overloaded on exam.  Most likely this is from COPD.   4. HTN: BP is controlled.  5. OSA: Patient has a thick  neck and significant daytime sleepiness.  I suspect that he has some degree of OSA.  We talked about sleep study today but he wants to hold off for now.   [name] 11/11/2012

## 2012-11-13 ENCOUNTER — Other Ambulatory Visit: Payer: Self-pay | Admitting: Internal Medicine

## 2012-12-02 ENCOUNTER — Ambulatory Visit: Payer: 59 | Admitting: Cardiology

## 2012-12-06 ENCOUNTER — Ambulatory Visit: Payer: Self-pay | Admitting: Cardiology

## 2012-12-13 ENCOUNTER — Telehealth: Payer: Self-pay | Admitting: Internal Medicine

## 2012-12-13 MED ORDER — ALBUTEROL SULFATE HFA 108 (90 BASE) MCG/ACT IN AERS: INHALATION_SPRAY | RESPIRATORY_TRACT | Status: DC

## 2012-12-13 NOTE — Telephone Encounter (Signed)
lmomtcb x1 for pt rx sent 

## 2012-12-13 NOTE — Telephone Encounter (Signed)
[id number] 

## 2012-12-13 NOTE — Telephone Encounter (Signed)
Patient returning call.  He is aware rx has been sent, but also wants to talk to nurse.  [phone]

## 2012-12-15 NOTE — Telephone Encounter (Signed)
lmomtcb for pt 

## 2012-12-16 NOTE — Telephone Encounter (Signed)
LMTCB x 3 

## 2012-12-17 NOTE — Telephone Encounter (Signed)
Spoke with patient-states he already spoke with someone and got his questions answered.  Nothing more needed.

## 2012-12-20 ENCOUNTER — Telehealth: Payer: Self-pay | Admitting: Internal Medicine

## 2012-12-20 MED ORDER — ALBUTEROL SULFATE HFA 108 (90 BASE) MCG/ACT IN AERS: 2.0000 | INHALATION_SPRAY | Freq: Four times a day (QID) | RESPIRATORY_TRACT | Status: DC | PRN

## 2012-12-20 NOTE — Telephone Encounter (Signed)
I just spoke with Medicaid and received this information as well. I have sent in RX for proair. Carron Curie, CMA

## 2012-12-23 ENCOUNTER — Other Ambulatory Visit: Payer: Self-pay | Admitting: Cardiology

## 2012-12-23 ENCOUNTER — Other Ambulatory Visit: Payer: Self-pay | Admitting: *Deleted

## 2012-12-23 MED ORDER — CLOPIDOGREL BISULFATE 75 MG PO TABS: 75.0000 mg | ORAL_TABLET | Freq: Every day | ORAL | Status: DC

## 2013-01-09 ENCOUNTER — Other Ambulatory Visit: Payer: Self-pay | Admitting: Cardiology

## 2013-02-03 ENCOUNTER — Other Ambulatory Visit: Payer: Self-pay | Admitting: *Deleted

## 2013-02-03 MED ORDER — ALBUTEROL SULFATE (2.5 MG/3ML) 0.083% IN NEBU: 2.5000 mg | INHALATION_SOLUTION | Freq: Four times a day (QID) | RESPIRATORY_TRACT | Status: DC

## 2013-02-09 ENCOUNTER — Ambulatory Visit (INDEPENDENT_AMBULATORY_CARE_PROVIDER_SITE_OTHER): Payer: BC Managed Care – PPO | Admitting: Internal Medicine

## 2013-02-09 ENCOUNTER — Encounter: Payer: Self-pay | Admitting: Internal Medicine

## 2013-02-09 VITALS — BP 104/62 | HR 80 | Ht 70.0 in | Wt 240.0 lb

## 2013-02-09 DIAGNOSIS — J441 Chronic obstructive pulmonary disease with (acute) exacerbation: Secondary | ICD-10-CM

## 2013-02-09 MED ORDER — PREDNISONE (PAK) 10 MG PO TABS: ORAL_TABLET | ORAL | Status: DC

## 2013-02-09 MED ORDER — PANTOPRAZOLE SODIUM 40 MG PO TBEC: 40.0000 mg | DELAYED_RELEASE_TABLET | Freq: Every day | ORAL | Status: DC

## 2013-02-09 NOTE — Patient Instructions (Addendum)
Pantoprazole (protonix) 40 mg   Take 30-60 min before first meal of the day and Pepcid ac 20 mg one bedtime until return to office - this is the best way to tell whether stomach acid is contributing to your problem.   GERD (REFLUX)  is an extremely common cause of respiratory symptoms just like yours, many times with no significant heartburn at all.    It can be treated with medication, but also with lifestyle changes including avoidance of late meals, excessive alcohol, smoking cessation, and avoid fatty foods, chocolate, peppermint, colas, red wine, and acidic juices such as orange juice.  NO MINT OR MENTHOL PRODUCTS SO NO COUGH DROPS  USE SUGARLESS CANDY INSTEAD (jolley ranchers or Stover's)  NO OIL BASED VITAMINS - use powdered substitutes.    Www.[facility].com is your best bet for generic drugs  Prednisone 10 mg take  4 each am x 2 days,   2 each am x 2 days,  1 each am x 2 days and stop  Plan A = automatic = Breo   Plan B = backup = Only use your albuterol (proaire) as a rescue medication to be used if you can't catch your breath by resting or doing a relaxed purse lip breathing pattern.  - The less you use it, the better it will work when you need it. - Ok to use up to every 4 hours if you must but call for immediate appointment if use goes up over your usual need - Don't leave home without it !!  (think of it like your spare tire for your car)  Plan C = neb albuterol, only use this if you try plan B first and it doesn't work  Please schedule a follow up office visit in 4 weeks, sooner if needed with Dr Marchelle Gearing  Samples of breo x 3, proair x 1 (200 puffs)

## 2013-02-09 NOTE — Progress Notes (Signed)
Subjective:    Patient ID: [name], male    DOB: [birth year], 57 y.o.   MRN: [id number]  HPI 1. 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 - Clinical worsening to stage 4 Fev1 to 1.34L/37%, Ratio 49 in setting of mdi refusal ($$) - Nov 2012 - No desaturation on submaximal exertion Nov 2012 - CAT score at baseline (without med Rx) : 26  - Ma 2013  2. Ex-smoker - quit may 2011  3. Class 2-3 dyspnea but pre-op VO2 max 8ml/kg/min - CPST 02/01/2010  4. RUL PET Hot interemediate prob nodule, ENB non-diagnostic 9/28/20111. Culture: mycobacterium kansasii. Not on drug Rx  5. 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 Metabolic Syndrome with Obesitywith ESS - 3 in June 2012  7.Compliance with meds: Cost issues with MDI v reluctance to take MDI  - June 2012 - reluctant - Dec 2012 - samples given, new insurance + but still not buying mdi due to out of pocket cost   OV 09/26/2011 Six month followup for all the above multiple issues.  Feels stable.  Denies AECOPD symptoms  Off mdi - cost issues. Unable to afford even $50 per mdi.  Dysoneic with climibing 1 flight and vacuuming at house since coming off mdi Wheeiy at work env due to fabric exposure and carrying boxes No aecopd acute deterioration All symptooms above are chronic Off gabpentin too due to cost and fact pain is better  CAT COPD Symptom and Quality of Life Score - reflects below a score of 26 and high symptom burden of copd placing him in high risk category with dyspnea and cough/fatigue main issues  REC   #Obstructive lung disease  - Too bad cost is an issue  - in order to combat cost we can try nebulizer treatment; might be cheaper  - take albuterol and atrovent generic nebulizer treatment 4 times daily with a nebulizer machine  - take spiriva till we get nebulizer  - no need to take symbicort due to cost  #Neuropathic pain  - Glad you are better and off gabapentin   #Weight and diabetes  - avoid rice, breads, pastas, sugars, desserts, corn  - eat more vegetables   OV 03/04/2012   Followup COPD and obesity   COPD: stable. CAT score 26 and stble (see below). Past few days increased hoarseness of voice without change in cough, dyspnea or sputum. Says is due to weather change. Insists he is not in aECOPD. REfuses COPD flare Rx even though his exam showed wheeze. Says nebs working out better for him from $ standpiint but doing atrovent neb only bid due to work issues. Doing alb prn. REfuses neb steroids due to cost. Trying to get disability but says he needs to be laid off to apply for disability and cannot afford being laid off. Yet unhappy with work; they work him too hard despite his copd and give him bad insurance and give him hard time to take time off to visit MD  Weight: rviewed his diet. Lost 8# in 1 year but I noted he hardly eats and what he eats is mostly bread. Never eats nuts. Rarely eats vegetables.    REC  #Followup  - 9 months or sooner if needed  #COPD  - continue your medications  - you are wheezing but at your request will hold off treating this as copd flare because you feel this is because  of missed medications today  - we will give you sample of ventolin HFA if we have it or pro-air hfa  - CMA will ensure refills  - glad you haad flu shot  - we discussed rehab again but understand at this time due to work issues you cannot attend it  CAT score and walk test at followup  OV 10/06/2012  Followup for COPD.  Last seen October 2013. Since then COPD stable. No reports of exacerbations. COPD cat score is 24 and is on baseline. He is using Atrovent nebulizer at home. His main issues are not COPD but other medical and financial issues reviewed in past medical history below. rec Copd appears stable Start breo 1 puff daily; learn technique  - Call for samples in future Use atroven nebs as needed   02/09/2013 f/u ov/[name] re: aecopd  off meds  Chief Complaint  Patient presents with  . Acute Visit    Reports SOB, coughing, headaches. States that none of his medications aren't helping. Onset was 3 weeks ago.    All he has for rescue neb is atovent and now sob at rest x within sev hours of neb assoc with chest tightness and subj wheeze sith congested cough > thick white mucus   No obvious day to day or daytime variabilty or assoc cp   overt sinus or hb symptoms. No unusual exp hx or h/o childhood pna/ asthma or knowledge of premature birth.    Also denies any obvious fluctuation of symptoms with weather or environmental changes or other aggravating or alleviating factors except as outlined above   Current Medications, Allergies, Complete Past Medical History, Past Surgical History, Family History, and Social History were reviewed in [employer] record.  ROS  The following are not active complaints unless bolded sore throat, dysphagia, dental problems, itching, sneezing,  nasal congestion or excess/ purulent secretions, ear ache,   fever, chills, sweats, unintended wt loss, pleuritic or exertional cp, hemoptysis,  orthopnea pnd or leg swelling, presyncope, palpitations, heartburn, abdominal pain, anorexia, nausea, vomiting, diarrhea  or change in bowel or urinary habits, change in stools or urine, dysuria,hematuria,  rash, arthralgias, visual complaints, headache, numbness weakness or ataxia or problems with walking or coordination,  change in mood/affect or memory.              Objective:   Physical Exam  Wt Readings from Last 3 Encounters:  02/09/13 240 lb (108.863 kg)  11/11/12 240 lb (108.863 kg)  10/06/12 241 lb (109.317 kg)      Last neb 6 h  prior to OV    HENT:  Head: Normocephalic and atraumatic.  Right Ear: External ear normal.  Left Ear: External ear normal.  Mouth/Throat: Oropharynx is clear and moist. No oropharyngeal exudate.  Eyes: Conjunctivae and EOM are normal. Pupils  are equal, round, and reactive to light. Right eye exhibits no discharge. Left eye exhibits no discharge. No scleral icterus.  Neck: Normal range of motion. Neck supple. No JVD present. No tracheal deviation present. No thyromegaly present.  Cardiovascular: Normal rate, regular rhythm and intact distal pulses. Exam reveals no gallop and no friction rub.  No murmur heard.  Pulmonary/Chest: insp exp sonorous rhonchi bilaterally better p xopenex Abdominal: Soft. Bowel sounds are normal. He exhibits no distension and no mass. There is no tenderness. There is no rebound and no guarding.  Musculoskeletal: Normal range of motion. He exhibits no edema and no tenderness.  Lymphadenopathy:  He has no cervical  adenopathy.  Neurological: He is alert and oriented to person, place, and time. He has normal reflexes. No cranial nerve deficit. Coordination normal.  Skin: Skin is warm and dry. No rash noted. He is not diaphoretic. No erythema. No pallor.  Psychiatric: FLAT AFFECT as before. His behavior is normal. Judgment and thought content normal.             Assessment & Plan:

## 2013-02-10 NOTE — Assessment & Plan Note (Addendum)
DDX of  difficult airways managment all start with A and  include Adherence, Ace Inhibitors, Acid Reflux, Active Sinus Disease, Alpha 1 Antitripsin deficiency, Anxiety masquerading as Airways dz,  ABPA,  allergy(esp in young), Aspiration (esp in elderly), Adverse effects of DPI,  Active smokers, plus two Bs  = Bronchiectasis and Beta blocker use..and one C= CHF  Adherence is always the initial "prime suspect" and is a multilayered concern that requires a "trust but verify" approach in every patient - starting with knowing how to use medications, especially inhalers, correctly, keeping up with refills and understanding the fundamental difference between maintenance and prns vs those medications only taken for a very short course and then stopped and not refilled.  His major problem is cost so will provide free samples until we have him back under control then regroup with Dr Marchelle Gearing before meds run out  The proper method of use, as well as anticipated side effects, of a metered-dose inhaler are discussed and demonstrated to the patient. Improved effectiveness after extensive coaching during this visit to a level of approximately  90%  ? Acid (or non-acid) GERD > always difficult to exclude as up to 75% of pts in some series report no assoc GI/ Heartburn symptoms> rec max (24h)  acid suppression and diet restrictions/ reviewed and instructions given in writting

## 2013-02-14 ENCOUNTER — Emergency Department (HOSPITAL_COMMUNITY): Payer: BC Managed Care – PPO

## 2013-02-14 ENCOUNTER — Inpatient Hospital Stay (HOSPITAL_COMMUNITY)
Admission: EM | Admit: 2013-02-14 | Discharge: 2013-02-16 | DRG: 541 | Disposition: A | Discharge status: Home Health | Payer: BC Managed Care – PPO | Attending: Internal Medicine | Admitting: Internal Medicine

## 2013-02-14 ENCOUNTER — Encounter (HOSPITAL_COMMUNITY): Payer: Self-pay

## 2013-02-14 DIAGNOSIS — Z7902 Long term (current) use of antithrombotics/antiplatelets: Secondary | ICD-10-CM

## 2013-02-14 DIAGNOSIS — Z7982 Long term (current) use of aspirin: Secondary | ICD-10-CM

## 2013-02-14 DIAGNOSIS — I208 Other forms of angina pectoris: Secondary | ICD-10-CM

## 2013-02-14 DIAGNOSIS — I129 Hypertensive chronic kidney disease with stage 1 through stage 4 chronic kidney disease, or unspecified chronic kidney disease: Secondary | ICD-10-CM | POA: Diagnosis present

## 2013-02-14 DIAGNOSIS — Z79899 Other long term (current) drug therapy: Secondary | ICD-10-CM

## 2013-02-14 DIAGNOSIS — M792 Neuralgia and neuritis, unspecified: Secondary | ICD-10-CM

## 2013-02-14 DIAGNOSIS — J449 Chronic obstructive pulmonary disease, unspecified: Secondary | ICD-10-CM

## 2013-02-14 DIAGNOSIS — J9620 Acute and chronic respiratory failure, unspecified whether with hypoxia or hypercapnia: Secondary | ICD-10-CM | POA: Diagnosis present

## 2013-02-14 DIAGNOSIS — I2510 Atherosclerotic heart disease of native coronary artery without angina pectoris: Secondary | ICD-10-CM | POA: Diagnosis present

## 2013-02-14 DIAGNOSIS — E785 Hyperlipidemia, unspecified: Secondary | ICD-10-CM | POA: Diagnosis present

## 2013-02-14 DIAGNOSIS — R918 Other nonspecific abnormal finding of lung field: Secondary | ICD-10-CM | POA: Diagnosis present

## 2013-02-14 DIAGNOSIS — T380X5A Adverse effect of glucocorticoids and synthetic analogues, initial encounter: Secondary | ICD-10-CM | POA: Diagnosis not present

## 2013-02-14 DIAGNOSIS — E8881 Metabolic syndrome: Secondary | ICD-10-CM

## 2013-02-14 DIAGNOSIS — IMO0001 Reserved for inherently not codable concepts without codable children: Secondary | ICD-10-CM | POA: Diagnosis not present

## 2013-02-14 DIAGNOSIS — I10 Essential (primary) hypertension: Secondary | ICD-10-CM | POA: Diagnosis present

## 2013-02-14 DIAGNOSIS — R222 Localized swelling, mass and lump, trunk: Principal | ICD-10-CM | POA: Diagnosis present

## 2013-02-14 DIAGNOSIS — E119 Type 2 diabetes mellitus without complications: Secondary | ICD-10-CM | POA: Diagnosis present

## 2013-02-14 DIAGNOSIS — I509 Heart failure, unspecified: Secondary | ICD-10-CM | POA: Diagnosis present

## 2013-02-14 DIAGNOSIS — J441 Chronic obstructive pulmonary disease with (acute) exacerbation: Secondary | ICD-10-CM | POA: Diagnosis present

## 2013-02-14 DIAGNOSIS — N183 Chronic kidney disease, stage 3 unspecified: Secondary | ICD-10-CM | POA: Diagnosis present

## 2013-02-14 DIAGNOSIS — I252 Old myocardial infarction: Secondary | ICD-10-CM

## 2013-02-14 DIAGNOSIS — E669 Obesity, unspecified: Secondary | ICD-10-CM | POA: Diagnosis present

## 2013-02-14 DIAGNOSIS — Z9861 Coronary angioplasty status: Secondary | ICD-10-CM

## 2013-02-14 DIAGNOSIS — Z87891 Personal history of nicotine dependence: Secondary | ICD-10-CM

## 2013-02-14 DIAGNOSIS — Z8673 Personal history of transient ischemic attack (TIA), and cerebral infarction without residual deficits: Secondary | ICD-10-CM

## 2013-02-14 DIAGNOSIS — Z6833 Body mass index (BMI) 33.0-33.9, adult: Secondary | ICD-10-CM

## 2013-02-14 LAB — BASIC METABOLIC PANEL
BUN: 22 mg/dL (ref 6–23)
Calcium: 9.2 mg/dL (ref 8.4–10.5)
Chloride: 105 mEq/L (ref 96–112)
Creatinine, Ser: 1.53 mg/dL — ABNORMAL HIGH (ref 0.50–1.35)
GFR calc Af Amer: 57 mL/min — ABNORMAL LOW (ref >90)
Glucose, Bld: 99 mg/dL (ref 70–99)

## 2013-02-14 LAB — POCT I-STAT, CHEM 8
BUN: 23 mg/dL (ref 6–23)
Chloride: 103 mEq/L (ref 96–112)
Creatinine, Ser: 1.5 mg/dL — ABNORMAL HIGH (ref 0.50–1.35)
Sodium: 140 mEq/L (ref 135–145)
TCO2: 28 mmol/L (ref 0–100)

## 2013-02-14 LAB — CBC
HCT: 40.9 % (ref 39.0–52.0)
Hemoglobin: 14.1 g/dL (ref 13.0–17.0)
MCH: 32.4 pg (ref 26.0–34.0)
MCHC: 34.5 g/dL (ref 30.0–36.0)
MCV: 94 fL (ref 78.0–100.0)
Platelets: 222 10*3/uL (ref 150–400)
RDW: 13.4 % (ref 11.5–15.5)
WBC: 8.7 10*3/uL (ref 4.0–10.5)

## 2013-02-14 LAB — POCT I-STAT TROPONIN I

## 2013-02-14 MED ORDER — ALBUTEROL SULFATE (5 MG/ML) 0.5% IN NEBU
5.0000 mg | INHALATION_SOLUTION | Freq: Once | RESPIRATORY_TRACT | Status: AC
  Administered 2013-02-14: 5 mg via RESPIRATORY_TRACT
  Filled 2013-02-14: qty 1

## 2013-02-14 MED ORDER — ONDANSETRON 4 MG PO TBDP: 4.0000 mg | ORAL_TABLET | Freq: Once | ORAL | Status: DC

## 2013-02-14 MED ORDER — LEVOFLOXACIN IN D5W 750 MG/150ML IV SOLN
750.0000 mg | Freq: Once | INTRAVENOUS | Status: AC
  Administered 2013-02-14: 750 mg via INTRAVENOUS
  Filled 2013-02-14: qty 150

## 2013-02-14 MED ORDER — SODIUM CHLORIDE 0.9 % IV BOLUS (SEPSIS)
250.0000 mL | Freq: Once | INTRAVENOUS | Status: AC
  Administered 2013-02-14: 250 mL via INTRAVENOUS

## 2013-02-14 MED ORDER — ALBUTEROL (5 MG/ML) CONTINUOUS INHALATION SOLN
10.0000 mg/h | INHALATION_SOLUTION | Freq: Once | RESPIRATORY_TRACT | Status: AC
  Administered 2013-02-14: 10 mg/h via RESPIRATORY_TRACT
  Filled 2013-02-14: qty 20

## 2013-02-14 MED ORDER — PREDNISONE 20 MG PO TABS: 60.0000 mg | ORAL_TABLET | ORAL | Status: DC

## 2013-02-14 MED ORDER — IOHEXOL 300 MG/ML  SOLN
80.0000 mL | Freq: Once | INTRAMUSCULAR | Status: AC | PRN
  Administered 2013-02-14: 75 mL via INTRAVENOUS

## 2013-02-14 MED ORDER — METHYLPREDNISOLONE SODIUM SUCC 125 MG IJ SOLR
125.0000 mg | INTRAMUSCULAR | Status: AC
  Administered 2013-02-14: 125 mg via INTRAVENOUS
  Filled 2013-02-14: qty 2

## 2013-02-14 NOTE — ED Notes (Signed)
MD at bedside. 

## 2013-02-14 NOTE — ED Provider Notes (Signed)
CSN: [id number]     Arrival date & time 02/14/13  1935 History   First MD Initiated Contact with Patient 02/14/13 2020     Chief Complaint  Patient presents with  . Shortness of Breath  . Cough   (Consider location/radiation/quality/duration/timing/severity/associated sxs/prior Treatment) HPI Patient presents with worsening dyspnea. Symptoms have been building for at least 3 weeks, though there have been some symptoms for some time longer. Patient uses oxygen at home with exertion, none at rest. There is no associated chest pain, but there is associated diaphoresis, generalized discomfort with exertion. No fevers, no chills, no cough. Patient has had no relief with albuterol therapy. Patient has a prior diagnosis of COPD, prior evaluation for possible malignancy in the distant past.  Past Medical History  Diagnosis Date  . CAD (coronary artery disease)     a. PCI 3/08 to OM2 and mid CFX; b.  NSTEMI 6/10:  Prior stents patent, 90% dCFX,  mRCA occl (old) with collats; EF 55% on LV-gram => Xience DES 2.5 x 23 mm to distal CFX;  c. abnl MV 12/13 => LHC 04/23/12:  pLAD 50%, oOM1 80%, long stented segment of the CFX extending into the PLOM with the PLOM totally occluded ostially, CFX 70% ISR, mCFX 80% ISR, pRCA chronically occluded. Med Rx planned  . Diabetes mellitus type II   . Hyperlipidemia   . COPD (chronic obstructive pulmonary disease)     Quit tobacco 09/2009; Gold Stage 2 with asthma component - FEV1 1.53 L/54%, 14% fev1 BD response, DLCO 54% - July 2011; MM genotype 01/07/10; unable to afford spiriva and unwilling to try ics due to prior renal failure: state to Dr. Marchelle Gearing, Aug 2011; started on atrovent HFA fall 2011. no desturation on walk test Dec 2011  . CKD (chronic kidney disease)   . Obesity   . History of CVA (cerebrovascular accident)     Without residual defecits  . 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 no further rx  . CHF (congestive heart failure)    Past Surgical History  Procedure Laterality Date  . Wedge resection  03/28/2010  . Coronary stent placement     Family History  Problem Relation Age of Onset  . Stroke Other    History  Substance Use Topics  . Smoking status: Former Smoker -- 1.00 packs/day for 40 years    Quit date: 09/09/2009  . Smokeless tobacco: Not on file  . Alcohol Use: No    Review of Systems  Constitutional:       Per HPI, otherwise negative  HENT:       Per HPI, otherwise negative  Respiratory:       Per HPI, otherwise negative  Cardiovascular:       Per HPI, otherwise negative  Gastrointestinal: Negative for vomiting.  Endocrine:       Negative aside from HPI  Genitourinary:       Neg aside from HPI   Musculoskeletal:       Per HPI, otherwise negative  Skin: Negative.   Neurological: Negative for syncope.    Allergies  Erythromycin and Penicillins  Home Medications   Current Outpatient Rx  Name  Route  Sig  Dispense  Refill  . albuterol (PROAIR HFA) 108 (90 BASE) MCG/ACT inhaler   Inhalation   Inhale 2 puffs into the lungs every 6 (six) hours as needed for wheezing or shortness of breath.         Marland Kitchen albuterol (PROVENTIL) (2.5 MG/3ML) 0.083% nebulizer solution   Nebulization   Take 2.5 mg by nebulization 5 (five) times daily as needed for wheezing or shortness of breath.         Marland Kitchen amLODipine (NORVASC) 2.5 MG tablet   Oral   Take 2.5 mg by mouth daily.         Marland Kitchen aspirin EC 81 MG tablet   Oral   Take 81 mg by mouth daily.         Marland Kitchen atorvastatin (LIPITOR) 40 MG tablet   Oral   Take 40 mg by mouth at bedtime.         . bisoprolol (ZEBETA) 5 MG tablet   Oral   Take 5 mg by mouth  daily.         . clopidogrel (PLAVIX) 75 MG tablet   Oral   Take 1 tablet (75 mg total) by mouth daily.   30 tablet   6   . Fluticasone Furoate-Vilanterol (BREO ELLIPTA) 100-25 MCG/INH AEPB   Inhalation   Inhale 1 puff into the lungs 2 (two) times daily.         Marland Kitchen glipiZIDE (GLUCOTROL) 10 MG tablet   Oral   Take 10 mg by mouth 2 (two) times daily before a meal.          . isosorbide mononitrate (IMDUR) 60 MG 24 hr tablet   Oral   Take 1 tablet (60 mg total) by mouth daily.   30 tablet   8   . losartan (COZAAR) 50 MG tablet   Oral   Take 1 tablet (50 mg total) by mouth daily.   30 tablet   8   . metFORMIN (GLUCOPHAGE-XR) 750 MG 24 hr tablet   Oral   Take 1,500 mg by mouth daily with breakfast.         . nitroGLYCERIN (NITROSTAT) 0.4 MG SL tablet   Sublingual   Place 0.4 mg under the tongue every 5 (five) minutes as needed for chest pain. May repeat up to 3 doses          BP 119/74  Pulse 92  Temp(Src) 98.1 F (36.7 C) (Oral)  Resp 20  Ht 5\' 10"  (1.778 m)  Wt 220 lb (99.791 kg)  BMI 31.57 kg/m2  SpO2 95% Physical Exam  Nursing note and vitals reviewed. Constitutional: He is oriented to person, place, and time. He appears well-developed and well-nourished. He appears distressed.  HENT:  Head: Normocephalic and atraumatic.  Eyes: Conjunctivae and EOM are normal.  Cardiovascular: Normal rate and regular rhythm.   Pulmonary/Chest: Effort normal. No stridor. No respiratory distress. He has decreased breath sounds in the left upper field, the left middle field and the left lower field. He has wheezes in the right upper field, the right middle field, the right lower field, the left upper field, the left middle field and the left lower field.  Abdominal: He exhibits no distension.  Musculoskeletal: He exhibits no edema.  Neurological: He is alert and oriented to person, place, and time.  Skin: Skin is warm. He is diaphoretic.  Psychiatric: He has a normal  mood and affect.    ED Course  Procedures (including critical care time) Labs  Review Labs Reviewed  BASIC METABOLIC PANEL - Abnormal; Notable for the following:    Creatinine, Ser 1.53 (*)    GFR calc non Af Amer 49 (*)    GFR calc Af Amer 57 (*)    All other components within normal limits  PRO B NATRIURETIC PEPTIDE - Abnormal; Notable for the following:    Pro B Natriuretic peptide (BNP) 290.3 (*)    All other components within normal limits  POCT I-STAT, CHEM 8 - Abnormal; Notable for the following:    Creatinine, Ser 1.50 (*)    All other components within normal limits  CBC  POCT I-STAT TROPONIN I   Imaging Review Dg Chest 2 View  02/14/2013   *RADIOLOGY REPORT*  Clinical Data: Initial encounter for 3-week history of shortness of breath, productive cough, and chest pain related to the cough. Former smoker who quit approximately 5 years ago.  Current history of COPD.  Prior history of wedge resection of a right upper lobe mass in September, 2011, pathology revealing necrotizing granulomas.  CHEST - 2 VIEW  Comparison: Two-view chest x-ray 06/12/2010, 05/01/2010, 02/05/2010.  PET CT 01/17/2010.  Findings: Interval development of a mass in the central left upper lobe in the vicinity of the left hilum.  The mass approximates 7.4 x 4.6 cm on the PA image, but is difficult to measure on the lateral image.  Cardiac silhouette normal in size, unchanged. Hilar and mediastinal contours otherwise unremarkable.  Minimal streaky airspace opacity in the left upper lobe.  Postsurgical changes in the right upper lobe with pleuroparenchymal scarring at the right base, unchanged.  No new abnormalities in the right lung. No pleural effusions.  Degenerative changes involving the thoracic spine.  IMPRESSION: Large mass involving the central left upper lobe, worrisome for a bronchogenic carcinoma.  Mild post-obstructive atelectasis and/or pneumonitis in the left upper lobe.   Original Report Authenticated By:  Hulan Saas, M.D.   Ct Chest W Contrast  02/14/2013   *RADIOLOGY REPORT*  Clinical Data: Dyspnea, mass seen on chest x-ray  CT CHEST WITH CONTRAST  Technique:  Multidetector CT imaging of the chest was performed following the standard protocol during bolus administration of intravenous contrast.  Contrast: 75mL OMNIPAQUE IOHEXOL 300 MG/ML SOLN  Comparison: Chest x-ray earlier today at 2028 hours; prior chest CT 01/03/2010; prior PET CT 01/17/2010  Findings:  Mediastinum: Bulky left hilar and mediastinal adenopathy.  An index prevascular lymph node measures 3.2 x 1.7 cm.  A left hilar nodal mass adjacent to the main pulmonary artery measures 3.5 x 2.6 cm. Left hilar nodal conglomerate posterior to the left main pulmonary artery measures 4.2 x 2.3 cm.  Left infrahilar mass like no conglomeration measures 5.5 x 4.1 cm.  There is associated mass effect on the left main pulmonary artery and the lower lobe branches.  No definite supraclavicular or right hilar adenopathy. Nonspecific subcarinal node measures 12 mm in short axis.  Para esophageal nodes have been present dating back to 2011 and likely benign.  Heart/Vascular: Conventional three-vessel arch anatomy.  The heart is within normal limits for size.  Atherosclerotic calcifications noted throughout the coronary arteries.  No pericardial effusion. No large central PE.  Lungs/Pleura:  The advanced centrilobular emphysema.  Cough, bronchovascular distribution of tree in bud micronodularity extending in the anterobasal segment the left lower lobe inferior to the left infrahilar mass lesion.  Findings are most consistent with postobstructive changes.  Surgical changes in the right upper lung consistent with prior wedge resection.  Upper Abdomen: 4 mm hypoattenuating focus in hepatic segment to just inferior to the diaphragm is too small to accurately characterize.  Otherwise, no focal hepatic lesion or upper abdominal evidence of metastatic disease.  Mild fatty  atrophy of the pancreas.  Scattered atherosclerotic vascular calcifications.  Bones: No acute fracture or aggressive appearing lytic or blastic osseous lesion.  Multiple remote healed right-sided rib fractures.  IMPRESSION:  1.  Left infrahilar mass lesion with probable metastatic left hilar and mediastinal adenopathy and mild postobstructive changes distally in the anterobasal segment of the left lower lung. Findings are highly concerning for primary bronchogenic carcinoma such as small cell lung cancer. Recommend further evaluation with outpatient PET CT followed by endobronchial biopsy.  If endobronchial biopsy is nondiagnostic, CT guided biopsy could be considered.  2.  Severe centrilobular pulmonary emphysema.  3.  Atherosclerosis including coronary artery disease  4.  Small 4 mm low attenuation lesion in the liver is nonspecific and warrants attention on follow-up imaging.   Original Report Authenticated By: Malachy Moan, M.D.    ECG: SR 91, nml  O2- 97%ra, nml  I reviewed the patient's chart, including CT in 2011 w biopsy.  On repeat exam after the initial nebulizer treatment, the patient has minimal improvement.  On second repeat evaluation the patient has slight improvement in his condition.  We reviewed the CT findings together. I discussed CT findings with our pulmonology team. MDM  No diagnosis found. This patient presents with dyspnea.  On exam he is diaphoretic, dyspneic, tachypneic.  Over, the patient has no fever, cough, and is mentating appropriately. Patient's evaluation is most notable for demonstration of left lower lobe opacification and likely postobstructive mass/pneumonia. After discussed the patient's case with our pulmonology team, the patient was admitted to the hospitalist service for further evaluation and management.    Gerhard Munch, MD 02/14/13 [phone]

## 2013-02-14 NOTE — ED Notes (Signed)
Pt c/o SOB, wheezing, increase SOB with any activity, dizziness, nausea, and vomiting x3 weeks. Pt f/u with his pulmonary dr last week for the same, pt used his at home neb treatment with no relief. Audible wheezing and congestion noted in triage

## 2013-02-15 ENCOUNTER — Telehealth: Payer: Self-pay | Admitting: Internal Medicine

## 2013-02-15 ENCOUNTER — Encounter (HOSPITAL_COMMUNITY): Payer: Self-pay | Admitting: General Practice

## 2013-02-15 DIAGNOSIS — R918 Other nonspecific abnormal finding of lung field: Secondary | ICD-10-CM

## 2013-02-15 DIAGNOSIS — R0602 Shortness of breath: Secondary | ICD-10-CM

## 2013-02-15 DIAGNOSIS — R222 Localized swelling, mass and lump, trunk: Principal | ICD-10-CM

## 2013-02-15 DIAGNOSIS — I2510 Atherosclerotic heart disease of native coronary artery without angina pectoris: Secondary | ICD-10-CM

## 2013-02-15 DIAGNOSIS — E119 Type 2 diabetes mellitus without complications: Secondary | ICD-10-CM

## 2013-02-15 LAB — HEMOGLOBIN A1C: Hgb A1c MFr Bld: 7.5 % — ABNORMAL HIGH (ref <5.7)

## 2013-02-15 LAB — STREP PNEUMONIAE URINARY ANTIGEN: Strep Pneumo Urinary Antigen: NEGATIVE

## 2013-02-15 LAB — GLUCOSE, CAPILLARY
Glucose-Capillary: 313 mg/dL — ABNORMAL HIGH (ref 70–99)
Glucose-Capillary: 287 mg/dL — ABNORMAL HIGH (ref 70–99)
Glucose-Capillary: 311 mg/dL — ABNORMAL HIGH (ref 70–99)
Glucose-Capillary: 353 mg/dL — ABNORMAL HIGH (ref 70–99)

## 2013-02-15 MED ORDER — ATORVASTATIN CALCIUM 40 MG PO TABS
40.0000 mg | ORAL_TABLET | Freq: Every day | ORAL | Status: DC
  Administered 2013-02-15: 23:00:00 40 mg via ORAL
  Filled 2013-02-15 (×2): qty 1

## 2013-02-15 MED ORDER — ALBUTEROL SULFATE (5 MG/ML) 0.5% IN NEBU: 2.5000 mg | INHALATION_SOLUTION | RESPIRATORY_TRACT | Status: DC | PRN

## 2013-02-15 MED ORDER — AMLODIPINE BESYLATE 2.5 MG PO TABS
2.5000 mg | ORAL_TABLET | Freq: Every day | ORAL | Status: DC
  Administered 2013-02-15 – 2013-02-16 (×2): 2.5 mg via ORAL
  Filled 2013-02-15 (×2): qty 1

## 2013-02-15 MED ORDER — NITROGLYCERIN 0.4 MG SL SUBL: 0.4000 mg | SUBLINGUAL_TABLET | SUBLINGUAL | Status: DC | PRN

## 2013-02-15 MED ORDER — METHYLPREDNISOLONE SODIUM SUCC 125 MG IJ SOLR
60.0000 mg | Freq: Two times a day (BID) | INTRAMUSCULAR | Status: DC
  Administered 2013-02-15 – 2013-02-16 (×2): 60 mg via INTRAVENOUS
  Filled 2013-02-15 (×4): qty 0.96

## 2013-02-15 MED ORDER — FLUTICASONE FUROATE-VILANTEROL 100-25 MCG/INH IN AEPB: 1.0000 | INHALATION_SPRAY | Freq: Two times a day (BID) | RESPIRATORY_TRACT | Status: DC

## 2013-02-15 MED ORDER — INSULIN ASPART 100 UNIT/ML ~~LOC~~ SOLN
0.0000 [IU] | Freq: Three times a day (TID) | SUBCUTANEOUS | Status: DC
  Administered 2013-02-15: 5 [IU] via SUBCUTANEOUS
  Administered 2013-02-15: 7 [IU] via SUBCUTANEOUS
  Administered 2013-02-16: 3 [IU] via SUBCUTANEOUS
  Administered 2013-02-16: 5 [IU] via SUBCUTANEOUS

## 2013-02-15 MED ORDER — ASPIRIN EC 81 MG PO TBEC
81.0000 mg | DELAYED_RELEASE_TABLET | Freq: Every day | ORAL | Status: DC
  Administered 2013-02-15 – 2013-02-16 (×2): 81 mg via ORAL
  Filled 2013-02-15 (×2): qty 1

## 2013-02-15 MED ORDER — DOCUSATE SODIUM 100 MG PO CAPS
100.0000 mg | ORAL_CAPSULE | Freq: Two times a day (BID) | ORAL | Status: DC
  Administered 2013-02-15 – 2013-02-16 (×2): 100 mg via ORAL
  Filled 2013-02-15 (×3): qty 1

## 2013-02-15 MED ORDER — ALBUTEROL SULFATE (5 MG/ML) 0.5% IN NEBU
2.5000 mg | INHALATION_SOLUTION | Freq: Four times a day (QID) | RESPIRATORY_TRACT | Status: DC
  Administered 2013-02-15 – 2013-02-16 (×4): 2.5 mg via RESPIRATORY_TRACT
  Filled 2013-02-15 (×5): qty 0.5

## 2013-02-15 MED ORDER — HYDROCODONE-ACETAMINOPHEN 5-325 MG PO TABS
1.0000 | ORAL_TABLET | ORAL | Status: DC | PRN
  Administered 2013-02-16: 2 via ORAL
  Filled 2013-02-15: qty 2

## 2013-02-15 MED ORDER — LOSARTAN POTASSIUM 50 MG PO TABS
50.0000 mg | ORAL_TABLET | Freq: Every day | ORAL | Status: DC
  Administered 2013-02-15 – 2013-02-16 (×2): 50 mg via ORAL
  Filled 2013-02-15 (×2): qty 1

## 2013-02-15 MED ORDER — ACETAMINOPHEN 650 MG RE SUPP: 650.0000 mg | Freq: Four times a day (QID) | RECTAL | Status: DC | PRN

## 2013-02-15 MED ORDER — BUDESONIDE-FORMOTEROL FUMARATE 160-4.5 MCG/ACT IN AERO
2.0000 | INHALATION_SPRAY | Freq: Two times a day (BID) | RESPIRATORY_TRACT | Status: DC
  Administered 2013-02-15: 09:00:00 2 via RESPIRATORY_TRACT
  Filled 2013-02-15: qty 6

## 2013-02-15 MED ORDER — GUAIFENESIN ER 600 MG PO TB12
600.0000 mg | ORAL_TABLET | Freq: Two times a day (BID) | ORAL | Status: DC
  Administered 2013-02-15 – 2013-02-16 (×3): 600 mg via ORAL
  Filled 2013-02-15 (×4): qty 1

## 2013-02-15 MED ORDER — INSULIN ASPART 100 UNIT/ML ~~LOC~~ SOLN
0.0000 [IU] | SUBCUTANEOUS | Status: DC
  Administered 2013-02-15: 09:00:00 7 [IU] via SUBCUTANEOUS

## 2013-02-15 MED ORDER — BUDESONIDE 0.25 MG/2ML IN SUSP
0.2500 mg | Freq: Two times a day (BID) | RESPIRATORY_TRACT | Status: DC
  Administered 2013-02-15 – 2013-02-16 (×3): 0.25 mg via RESPIRATORY_TRACT
  Filled 2013-02-15 (×5): qty 2

## 2013-02-15 MED ORDER — INSULIN ASPART 100 UNIT/ML ~~LOC~~ SOLN
0.0000 [IU] | Freq: Every day | SUBCUTANEOUS | Status: DC
  Administered 2013-02-15: 23:00:00 5 [IU] via SUBCUTANEOUS

## 2013-02-15 MED ORDER — MORPHINE SULFATE 2 MG/ML IJ SOLN
2.0000 mg | INTRAMUSCULAR | Status: DC | PRN
  Administered 2013-02-16: 2 mg via INTRAVENOUS
  Filled 2013-02-15: qty 1

## 2013-02-15 MED ORDER — IPRATROPIUM BROMIDE 0.02 % IN SOLN
0.5000 mg | Freq: Four times a day (QID) | RESPIRATORY_TRACT | Status: DC
  Administered 2013-02-15 – 2013-02-16 (×4): 0.5 mg via RESPIRATORY_TRACT
  Filled 2013-02-15 (×5): qty 2.5

## 2013-02-15 MED ORDER — CLOPIDOGREL BISULFATE 75 MG PO TABS
75.0000 mg | ORAL_TABLET | Freq: Every day | ORAL | Status: DC
  Administered 2013-02-15 – 2013-02-16 (×2): 75 mg via ORAL
  Filled 2013-02-15 (×3): qty 1

## 2013-02-15 MED ORDER — ALBUTEROL SULFATE (5 MG/ML) 0.5% IN NEBU
2.5000 mg | INHALATION_SOLUTION | RESPIRATORY_TRACT | Status: AC
  Administered 2013-02-15 (×3): 2.5 mg via RESPIRATORY_TRACT
  Filled 2013-02-15 (×3): qty 0.5

## 2013-02-15 MED ORDER — BISOPROLOL FUMARATE 5 MG PO TABS
5.0000 mg | ORAL_TABLET | Freq: Every day | ORAL | Status: DC
  Administered 2013-02-15 – 2013-02-16 (×2): 5 mg via ORAL
  Filled 2013-02-15 (×2): qty 1

## 2013-02-15 MED ORDER — ONDANSETRON HCL 4 MG/2ML IJ SOLN: 4.0000 mg | Freq: Four times a day (QID) | INTRAMUSCULAR | Status: DC | PRN

## 2013-02-15 MED ORDER — INSULIN GLARGINE 100 UNIT/ML ~~LOC~~ SOLN
10.0000 [IU] | Freq: Every day | SUBCUTANEOUS | Status: DC
  Administered 2013-02-15 – 2013-02-16 (×2): 10 [IU] via SUBCUTANEOUS
  Filled 2013-02-15 (×3): qty 0.1

## 2013-02-15 MED ORDER — INSULIN GLARGINE 100 UNIT/ML ~~LOC~~ SOLN: 15.0000 [IU] | Freq: Every day | SUBCUTANEOUS | Status: DC

## 2013-02-15 MED ORDER — ONDANSETRON HCL 4 MG PO TABS: 4.0000 mg | ORAL_TABLET | Freq: Four times a day (QID) | ORAL | Status: DC | PRN

## 2013-02-15 MED ORDER — ENOXAPARIN SODIUM 40 MG/0.4ML ~~LOC~~ SOLN
40.0000 mg | Freq: Every day | SUBCUTANEOUS | Status: DC
  Filled 2013-02-15 (×2): qty 0.4

## 2013-02-15 MED ORDER — LEVOFLOXACIN IN D5W 750 MG/150ML IV SOLN
750.0000 mg | Freq: Every day | INTRAVENOUS | Status: DC
  Administered 2013-02-15: 23:00:00 750 mg via INTRAVENOUS
  Filled 2013-02-15 (×2): qty 150

## 2013-02-15 MED ORDER — IPRATROPIUM BROMIDE 0.02 % IN SOLN
0.5000 mg | Freq: Four times a day (QID) | RESPIRATORY_TRACT | Status: DC
  Administered 2013-02-15: 09:00:00 0.5 mg via RESPIRATORY_TRACT
  Filled 2013-02-15: qty 2.5

## 2013-02-15 MED ORDER — SODIUM CHLORIDE 0.9 % IJ SOLN
3.0000 mL | Freq: Two times a day (BID) | INTRAMUSCULAR | Status: DC
  Administered 2013-02-15 – 2013-02-16 (×2): 3 mL via INTRAVENOUS

## 2013-02-15 MED ORDER — ACETAMINOPHEN 325 MG PO TABS
650.0000 mg | ORAL_TABLET | Freq: Four times a day (QID) | ORAL | Status: DC | PRN
  Administered 2013-02-16: 650 mg via ORAL
  Filled 2013-02-15: qty 2

## 2013-02-15 MED ORDER — ISOSORBIDE MONONITRATE ER 60 MG PO TB24
60.0000 mg | ORAL_TABLET | Freq: Every day | ORAL | Status: DC
  Administered 2013-02-15 – 2013-02-16 (×2): 60 mg via ORAL
  Filled 2013-02-15 (×2): qty 1

## 2013-02-15 NOTE — Progress Notes (Signed)
Brief Nutrition Note:  RD pulled to chart for malnutrition screening tool.  Pt states that he has had a normal appetite and weight has been stable. Wt Readings from Last 5 Encounters:  02/15/13 231 lb 4.2 oz (104.9 kg)  02/09/13 240 lb (108.863 kg)  11/11/12 240 lb (108.863 kg)  10/06/12 241 lb (109.317 kg)  08/02/12 234 lb 12.8 oz (106.505 kg)   Pt ate 100% breakfast tray.   No nutrition interventions warranted at this time. Please consult as needed.  Isabell Jarvis RD, LDN Pager [phone] After Hours pager [phone]

## 2013-02-15 NOTE — Telephone Encounter (Signed)
Rhonda scheduled PET for 10-16. MR is aware and Bjorn Loser will contact the pt with information. Carron Curie, CMA

## 2013-02-15 NOTE — Telephone Encounter (Signed)
Actually ebus date is 02/28/13. PET scan before that is fine   Dr. Kalman Shan, M.D., [facility].C.P Pulmonary and Critical Care Medicine Staff Physician  System [place] [facility] Pager: [phone], If no answer or between  15:00h - 7:00h: call [phone]  02/15/2013 12:10 PM

## 2013-02-15 NOTE — Progress Notes (Addendum)
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 In the ED, CT chest showed a left infrahilar mass concerning for carcinoma with possible metastases.  Assessment/Plan:  Left infrahilar lung mass per CT - Concerning for lung cancer with metastasis  - Pulmonary consultation appreciated. - Pulmonary have arranged for a PET scan (their office will schedule) followed by EBUS on 02/28/13 - Dr. Marchelle Gearing recommended resuming Plavix. Plavix to be held from 02/21/13 for procedure on 02/28/13.   COPD exacerbation  - Continue oxygen, bronchodilator nebulizations, Levaquin & Solumedrol  - Monitor closely.   CAD/PCI's/history of NSTEMI - Asymptomatic of chest pain.  - Continue home medications.  DM 2  - Uncontrolled. Likely precipitated by steroids. - Start low-dose Lantus and continue SSI. Hold oral meds while hospitalized.  - Check hemoglobin A1c.  Stage III chronic kidney disease  - Creatinine stable   DVT prophylaxis:  Lovenox  Lines/catheters: PIV Nutrition: Diabetic diet  Activity:  Up with assistance Code Status: Full Family Communication: None Disposition Plan: Home when medically stable.   Consultants:  Pulmonology  Procedures:  None  Antibiotics:  Levaquin   Subjective: Dyspnea. Mostly nonproductive cough. No CP.  Objective: Filed Vitals:   02/15/13 0836 02/15/13 1006 02/15/13 1113 02/15/13 1140  BP:  137/82    Pulse:  92    Temp:  97.8 F (36.6 C)    TempSrc:  Oral    Resp:  20    Height:      Weight:      SpO2: 99% 98% 91% 94%    Intake/Output Summary (Last 24 hours) at 02/15/13 1319 Last data filed at 02/15/13 0900  Gross per 24 hour   Intake      0 ml  Output    350 ml  Net   -350 ml   Filed Weights   02/14/13 1951 02/15/13 0205  Weight: 99.791 kg (220 lb) 104.9 kg (231 lb 4.2 oz)     Exam:  General exam:  moderately built and nourished male patient lying comfortably in bed. Dyspnea with minimal exertion.  Respiratory system:  reduced breath sounds bilaterally with bilateral medium pitched expiratory rhonchi . No increased work of breathing. able to speak in full sentences.  Cardiovascular system: S1 & S2 heard, RRR. No JVD, murmurs, gallops, clicks or pedal edema. Gastrointestinal system: Abdomen is nondistended, soft and nontender. Normal bowel sounds heard. Central nervous system: Alert and oriented. No focal neurological deficits. Extremities: Symmetric 5 x 5 power.   Data Reviewed: Basic Metabolic Panel:  Recent Labs Lab 02/14/13 1953 02/14/13 2112  NA 142 140  K 4.5 4.0  CL 105 103  CO2 26  --   GLUCOSE 99 96  BUN 22 23  CREATININE 1.53* 1.50*  CALCIUM 9.2  --    Liver Function Tests: No results found for this basename: AST, ALT, ALKPHOS, BILITOT, PROT, ALBUMIN,  in the last 168 hours No results found for this basename: LIPASE, AMYLASE,  in the last 168 hours No results found for this basename: AMMONIA,  in the last 168 hours CBC:  Recent Labs Lab 02/14/13 1953 02/14/13 2112  WBC 8.7  --   HGB 14.1 14.6  HCT 40.9 43.0  MCV 94.0  --  PLT 222  --    Cardiac Enzymes: No results found for this basename: CKTOTAL, CKMB, CKMBINDEX, TROPONINI,  in the last 168 hours BNP (last 3 results)  Recent Labs  02/14/13 1953  PROBNP 290.3*   CBG:  Recent Labs Lab 02/15/13 0734 02/15/13 1131  GLUCAP 313* 287*    No results found for this or any previous visit (from the past 240 hour(s)).    Additional labs: 1. None     Studies: Dg Chest 2 View  02/14/2013   *RADIOLOGY REPORT*  Clinical Data: Initial encounter for 3-week history of shortness of breath, productive cough, and  chest pain related to the cough. Former smoker who quit approximately 5 years ago.  Current history of COPD.  Prior history of wedge resection of a right upper lobe mass in September, 2011, pathology revealing necrotizing granulomas.  CHEST - 2 VIEW  Comparison: Two-view chest x-ray 06/12/2010, 05/01/2010, 02/05/2010.  PET CT 01/17/2010.  Findings: Interval development of a mass in the central left upper lobe in the vicinity of the left hilum.  The mass approximates 7.4 x 4.6 cm on the PA image, but is difficult to measure on the lateral image.  Cardiac silhouette normal in size, unchanged. Hilar and mediastinal contours otherwise unremarkable.  Minimal streaky airspace opacity in the left upper lobe.  Postsurgical changes in the right upper lobe with pleuroparenchymal scarring at the right base, unchanged.  No new abnormalities in the right lung. No pleural effusions.  Degenerative changes involving the thoracic spine.  IMPRESSION: Large mass involving the central left upper lobe, worrisome for a bronchogenic carcinoma.  Mild post-obstructive atelectasis and/or pneumonitis in the left upper lobe.   Original Report Authenticated By: Hulan Saas, M.D.   Ct Chest W Contrast  02/14/2013   *RADIOLOGY REPORT*  Clinical Data: Dyspnea, mass seen on chest x-ray  CT CHEST WITH CONTRAST  Technique:  Multidetector CT imaging of the chest was performed following the standard protocol during bolus administration of intravenous contrast.  Contrast: 75mL OMNIPAQUE IOHEXOL 300 MG/ML SOLN  Comparison: Chest x-ray earlier today at 2028 hours; prior chest CT 01/03/2010; prior PET CT 01/17/2010  Findings:  Mediastinum: Bulky left hilar and mediastinal adenopathy.  An index prevascular lymph node measures 3.2 x 1.7 cm.  A left hilar nodal mass adjacent to the main pulmonary artery measures 3.5 x 2.6 cm. Left hilar nodal conglomerate posterior to the left main pulmonary artery measures 4.2 x 2.3 cm.  Left infrahilar mass like no  conglomeration measures 5.5 x 4.1 cm.  There is associated mass effect on the left main pulmonary artery and the lower lobe branches.  No definite supraclavicular or right hilar adenopathy. Nonspecific subcarinal node measures 12 mm in short axis.  Para esophageal nodes have been present dating back to 2011 and likely benign.  Heart/Vascular: Conventional three-vessel arch anatomy.  The heart is within normal limits for size.  Atherosclerotic calcifications noted throughout the coronary arteries.  No pericardial effusion. No large central PE.  Lungs/Pleura:  The advanced centrilobular emphysema.  Cough, bronchovascular distribution of tree in bud micronodularity extending in the anterobasal segment the left lower lobe inferior to the left infrahilar mass lesion.  Findings are most consistent with postobstructive changes.  Surgical changes in the right upper lung consistent with prior wedge resection.  Upper Abdomen: 4 mm hypoattenuating focus in hepatic segment to just inferior to the diaphragm is too small to accurately characterize.  Otherwise, no focal hepatic lesion or upper abdominal evidence of  metastatic disease.  Mild fatty atrophy of the pancreas.  Scattered atherosclerotic vascular calcifications.  Bones: No acute fracture or aggressive appearing lytic or blastic osseous lesion.  Multiple remote healed right-sided rib fractures.  IMPRESSION:  1.  Left infrahilar mass lesion with probable metastatic left hilar and mediastinal adenopathy and mild postobstructive changes distally in the anterobasal segment of the left lower lung. Findings are highly concerning for primary bronchogenic carcinoma such as small cell lung cancer. Recommend further evaluation with outpatient PET CT followed by endobronchial biopsy.  If endobronchial biopsy is nondiagnostic, CT guided biopsy could be considered.  2.  Severe centrilobular pulmonary emphysema.  3.  Atherosclerosis including coronary artery disease  4.  Small 4 mm low  attenuation lesion in the liver is nonspecific and warrants attention on follow-up imaging.   Original Report Authenticated By: Malachy Moan, M.D.        Scheduled Meds: . ipratropium  0.5 mg Nebulization Q6H   And  . albuterol  2.5 mg Nebulization Q6H  . amLODipine  2.5 mg Oral Daily  . atorvastatin  40 mg Oral QHS  . bisoprolol  5 mg Oral Daily  . budesonide  0.25 mg Nebulization BID  . docusate sodium  100 mg Oral BID  . enoxaparin (LOVENOX) injection  40 mg Subcutaneous Daily  . guaiFENesin  600 mg Oral BID  . insulin aspart  0-5 Units Subcutaneous QHS  . insulin aspart  0-9 Units Subcutaneous TID WC  . isosorbide mononitrate  60 mg Oral Daily  . levofloxacin (LEVAQUIN) IV  750 mg Intravenous QHS  . losartan  50 mg Oral Daily  . sodium chloride  3 mL Intravenous Q12H   Continuous Infusions:   Active Problems:   DM   HYPERTENSION, UNSPECIFIED   Lung mass    Time spent: 35 minutes.    [facility]  [facility] Pager [phone].   If 8PM-8AM, please contact night-coverage at [email or web address], password [facility] 02/15/2013, 1:19 PM  LOS: 1 day

## 2013-02-15 NOTE — Consult Note (Addendum)
PULMONARY  / CRITICAL CARE MEDICINE  Name: [name] MRN: [id number] DOB: 02/24/1956    ADMISSION DATE:  02/14/2013 CONSULTATION DATE:  02/15/13  REFERRING MD :  Adela Glimpse PRIMARY SERVICE:  [facility]  CHIEF COMPLAINT: Worsening SOB with CT confirmation of Left Infrahilar Mass  BRIEF PATIENT DESCRIPTION:  57 y/o M admitted on 10/6 with worsening DOE in setting of Gold II COPD.  Work up demonstrated L infrahilar mass concerning for carcinoma with possible mets. PCCM consulted for further eval.    SIGNIFICANT EVENTS / STUDIES:  02/25/13: CT Chest : Left infrahilar mass lesion with probable metastatic left hilar and mediastinal adenopathy and mild postobstructive changes distally in the anterobasal segment of the left lower lung. Findings are highly concerning for primary bronchogenic carcinoma such as small cell lung cancer. Severe centrilobular pulmonary emphysema.  CULTURES: 10/07 Blood x 2>>>  ANTIBIOTICS: 10/07 Levaquin>>>   HISTORY OF PRESENT ILLNESS: 57 y/o M, with PMH of Gold II COPD with asthmatic component, admitted 10/6 with a 3 week history of SOB and productive cough with clear sputum. The nebs he uses at home have not been effective and he became so dyspneic he scheduled an appointment with Dr. Sherene Sires emergently on 10/01. He was given a neb  treatment in the office that made him feel better, but was SOB again the next day. He got significantly worse 10/07 and presented to the ED. CT scan in the ED revealed a Left infrahilar mass lesion with probable metastatic left hilar and mediastinal adenopathy and mild postobstructive changes  distally in the anterobasal segment of the left lower lung. CCM has been asked to consult for further work-up and treatment  of this mass.   Patient treated with IV abx, solu-medrol and nebulized steroids with subjective improvement in symptoms.  He has had difficulty obtaining medications as outpatient.  He relays that due to cost, he only uses regimen  when absolutely necessary.  Continues to work and reports he has "terrible insurance".  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    Currently denies fevers, n/v/d, chest pain.  Reports improvement in DOE but continues to be SOB with activity.   PAST MEDICAL HISTORY :  Past Medical History  Diagnosis Date  . CAD (coronary artery disease)     a. PCI 3/08 to OM2 and mid CFX; b.  NSTEMI 6/10:  Prior stents patent, 90% dCFX,  mRCA occl (old) with collats; EF 55% on LV-gram => Xience DES 2.5 x 23 mm to distal CFX;  c. abnl MV 12/13 => LHC 04/23/12:  pLAD 50%, oOM1 80%, long stented segment of the CFX extending into the PLOM with the PLOM totally occluded ostially, CFX 70% ISR, mCFX 80% ISR, pRCA chronically occluded. Med Rx planned  . Diabetes mellitus type II   . Hyperlipidemia   . COPD (chronic obstructive pulmonary disease)     Quit tobacco 09/2009; Gold Stage 2 with asthma component - FEV1 1.53 L/54%, 14% fev1 BD response, DLCO 54% - July 2011; MM genotype 01/07/10; unable to afford spiriva and unwilling to try ics due to prior renal failure: state to Dr. Marchelle Gearing, Aug 2011; started on atrovent HFA fall 2011. no desturation on walk test Dec 2011  . CKD (chronic kidney disease)   . Obesity   . History of CVA (cerebrovascular accident)     Without residual defecits  . 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 no further rx  . CHF (congestive heart failure)    Past Surgical History  Procedure Laterality Date  . Wedge resection  03/28/2010  . Coronary stent placement     Prior to Admission medications   Medication Sig Start Date End Date Taking? Authorizing Provider  albuterol (PROAIR HFA) 108 (90 BASE)  MCG/ACT inhaler Inhale 2 puffs into the lungs every 6 (six) hours as needed for wheezing or shortness of breath.   Yes Historical Provider, MD  albuterol (PROVENTIL) (2.5 MG/3ML) 0.083% nebulizer solution Take 2.5 mg by nebulization 5 (five) times daily as needed for wheezing or shortness of breath.   Yes Historical Provider, MD  amLODipine (NORVASC) 2.5 MG tablet Take 2.5 mg by mouth daily.   Yes Historical Provider, MD  aspirin EC 81 MG tablet Take 81 mg by mouth daily.   Yes Historical Provider, MD  atorvastatin (LIPITOR) 40 MG tablet Take 40 mg by mouth at bedtime.   Yes Historical Provider, MD  bisoprolol (ZEBETA) 5 MG tablet Take 5 mg by mouth daily.   Yes Historical Provider, MD  clopidogrel (PLAVIX) 75 MG tablet Take 1 tablet (75 mg total) by mouth daily. 12/23/12  Yes Laurey Morale, MD  Fluticasone Furoate-Vilanterol (BREO ELLIPTA) 100-25 MCG/INH AEPB Inhale 1 puff into the lungs 2 (two) times daily.   Yes Historical Provider, MD  glipiZIDE (GLUCOTROL) 10 MG tablet Take 10 mg by mouth 2 (two) times daily before a meal.    Yes Historical Provider, MD  isosorbide mononitrate (IMDUR) 60 MG 24 hr tablet Take 1 tablet (60 mg total) by mouth daily. 08/10/12  Yes Laurey Morale, MD  losartan (COZAAR) 50 MG tablet Take 1 tablet (50 mg total) by mouth daily. 08/10/12  Yes Laurey Morale, MD  metFORMIN (GLUCOPHAGE-XR) 750 MG 24 hr tablet Take 1,500 mg by mouth daily with breakfast.   Yes Historical Provider, MD  nitroGLYCERIN (NITROSTAT) 0.4 MG SL tablet Place 0.4 mg under the tongue every 5 (five) minutes as needed for chest pain. May repeat up to 3 doses   Yes Historical Provider, MD   Allergies  Allergen Reactions  . Erythromycin Other (See Comments)    Reaction while in hospital - pt doesn't know reaction  . Penicillins Other (See Comments)    Childhood reaction - unknown    FAMILY HISTORY:  Family History  Problem Relation Age of Onset  . Stroke Other   . Diabetes Mother   . Cancer  Maternal Uncle    SOCIAL HISTORY:  reports that he quit smoking about 3 years ago. He does not have any smokeless tobacco history on file. He reports that he does not drink alcohol or use illicit drugs.  REVIEW OF SYSTEMS:   Constitutional: Negative for fever, chills, weight loss, malaise/fatigue and diaphoresis only intermittently with exertion-.  HENT: Negative for hearing loss, ear pain, nosebleeds, congestion, sore throat, neck pain, tinnitus and ear discharge.   Eyes: Negative for blurred vision, double vision, photophobia , discharge and redness. Positive for Headache behind the left eye after combining meds for SOB.( Brio/Albuterol/ProAir.) Respiratory: Positive for cough,Negative for hemoptysis,+ sputum production ( Clear),+ shortness of breath,+ wheezing( but states better right now) and Negative stridor.   Cardiovascular: Negative for chest pain, palpitations, orthopnea, claudication, leg swelling and PND.  Gastrointestinal: Negative for heartburn, + for nausea,+for dry heaving but no vomiting, neg abdominal pain, diarrhea, constipation, blood in stool and melena.  Genitourinary: Negative for dysuria, urgency, frequency, hematuria and flank pain.  Musculoskeletal: Negative for myalgias, back pain, joint pain and falls.  Skin: Negative for itching and rash.  Neurological: Negative for dizziness, tingling, tremors, sensory change, speech change, focal weakness, seizures, loss of consciousness, weakness and headaches.  Endo/Heme/Allergies: Negative for environmental allergies and polydipsia. Does not bruise/bleed easily.  SUBJECTIVE: "Feeling better since they gave me the steroids in the Emergency Room".  VITAL SIGNS: Temp:  [97.9 F (36.6 C)-98.7 F (37.1 C)] 98.5 F (36.9 C) (10/07 0551) Pulse Rate:  [81-104] 91 (10/07 0551) Resp:  [18-24] 18 (10/07 0551) BP: (107-134)/(63-80) 131/80 mmHg (10/07 0551) SpO2:  [92 %-99 %] 99 % (10/07 0836) Weight:  [220 lb (99.791 kg)-231 lb 4.2  oz (104.9 kg)] 231 lb 4.2 oz (104.9 kg) (10/07 0205)  PHYSICAL EXAMINATION: General:  Alert and appropriate, conversational, in NAD. Neuro:   Grossly intact,moving all extremities equally.  HEENT:  MM pink and moist, atraumatic, normocephalic,adequate dentition Neck:  Supple,no lymphadenopathy. Cardiovascular:  S1, S2, No rubs or murmurs, RRR Lungs: Clear with some scattered rhonchi which clears with cough. Diminished in the bases, L> R.No wheezing noted while at rest. Abdomen:  Obese, BS present, non-tender, non-distended. Musculoskeletal:  No cyanosis, edema, good strength. Skin: normal skin turgor, dry and intact with no rash.   Recent Labs Lab 02/14/13 1953 02/14/13 2112  NA 142 140  K 4.5 4.0  CL 105 103  CO2 26  --   BUN 22 23  CREATININE 1.53* 1.50*  GLUCOSE 99 96    Recent Labs Lab 02/14/13 1953 02/14/13 2112  HGB 14.1 14.6  HCT 40.9 43.0  WBC 8.7  --   PLT 222  --    Dg Chest 2 View  02/14/2013   *RADIOLOGY REPORT*  Clinical Data: Initial encounter for 3-week history of shortness of breath, productive cough, and chest pain related to the cough. Former smoker who quit approximately 5 years ago.  Current history of COPD.  Prior history of wedge resection of a right upper lobe mass in September, 2011, pathology revealing necrotizing granulomas.  CHEST - 2 VIEW  Comparison: Two-view chest x-ray 06/12/2010, 05/01/2010, 02/05/2010.  PET CT 01/17/2010.  Findings: Interval development of a mass in the central left upper lobe in the vicinity of the left hilum.  The mass approximates 7.4 x 4.6 cm on the PA image, but is difficult to measure on the lateral image.  Cardiac silhouette normal in size, unchanged. Hilar and mediastinal contours otherwise unremarkable.  Minimal streaky airspace opacity in the left upper lobe.  Postsurgical changes in the right upper lobe with pleuroparenchymal scarring at the right base, unchanged.  No new abnormalities in the right lung. No pleural  effusions.  Degenerative changes involving the thoracic spine.  IMPRESSION: Large mass involving the central left upper lobe, worrisome for a bronchogenic carcinoma.  Mild post-obstructive atelectasis and/or pneumonitis in the left upper lobe.   Original Report Authenticated By: Hulan Saas, M.D.   Ct Chest W Contrast  02/14/2013   *RADIOLOGY REPORT*  Clinical Data: Dyspnea, mass seen on chest x-ray  CT CHEST WITH CONTRAST  Technique:  Multidetector CT imaging of the chest was performed following the standard protocol during bolus administration of intravenous contrast.  Contrast: 75mL OMNIPAQUE IOHEXOL 300 MG/ML SOLN  Comparison: Chest x-ray earlier today at 2028 hours; prior  chest CT 01/03/2010; prior PET CT 01/17/2010  Findings:  Mediastinum: Bulky left hilar and mediastinal adenopathy.  An index prevascular lymph node measures 3.2 x 1.7 cm.  A left hilar nodal mass adjacent to the main pulmonary artery measures 3.5 x 2.6 cm. Left hilar nodal conglomerate posterior to the left main pulmonary artery measures 4.2 x 2.3 cm.  Left infrahilar mass like no conglomeration measures 5.5 x 4.1 cm.  There is associated mass effect on the left main pulmonary artery and the lower lobe branches.  No definite supraclavicular or right hilar adenopathy. Nonspecific subcarinal node measures 12 mm in short axis.  Para esophageal nodes have been present dating back to 2011 and likely benign.  Heart/Vascular: Conventional three-vessel arch anatomy.  The heart is within normal limits for size.  Atherosclerotic calcifications noted throughout the coronary arteries.  No pericardial effusion. No large central PE.  Lungs/Pleura:  The advanced centrilobular emphysema.  Cough, bronchovascular distribution of tree in bud micronodularity extending in the anterobasal segment the left lower lobe inferior to the left infrahilar mass lesion.  Findings are most consistent with postobstructive changes.  Surgical changes in the right upper  lung consistent with prior wedge resection.  Upper Abdomen: 4 mm hypoattenuating focus in hepatic segment to just inferior to the diaphragm is too small to accurately characterize.  Otherwise, no focal hepatic lesion or upper abdominal evidence of metastatic disease.  Mild fatty atrophy of the pancreas.  Scattered atherosclerotic vascular calcifications.  Bones: No acute fracture or aggressive appearing lytic or blastic osseous lesion.  Multiple remote healed right-sided rib fractures.  IMPRESSION:  1.  Left infrahilar mass lesion with probable metastatic left hilar and mediastinal adenopathy and mild postobstructive changes distally in the anterobasal segment of the left lower lung. Findings are highly concerning for primary bronchogenic carcinoma such as small cell lung cancer. Recommend further evaluation with outpatient PET CT followed by endobronchial biopsy.  If endobronchial biopsy is nondiagnostic, CT guided biopsy could be considered.  2.  Severe centrilobular pulmonary emphysema.  3.  Atherosclerosis including coronary artery disease  4.  Small 4 mm low attenuation lesion in the liver is nonspecific and warrants attention on follow-up imaging.   Original Report Authenticated By: Malachy Moan, M.D.    ASSESSMENT / PLAN:  ADoylene Canning Stage 2 COPD with new findings of Left intra hilar lung mass per CT with concern for post obstructive process likely.  P: - will have office schedule PET Scan prior to 02/28/13 EBUS date -Schedule EBUS to obtain tissue sample for diagnosis and, if necessary, staging of malignancy  02/28/13 7.30am -Hold  Plavix (stent / CAD) for pending procedure if it is 02/21/13. If EBUS is 02/28/13 will dc plavix on 02/21/13 (d/w cards) -Adjust nebs to Q6h albuterol/atrovent (no bronchospasm on exam)  + pulmicort neb bid -Wean O2 to 1.5 Liters in patient with COPD and sats of 98% on 4L.  Minimal oxygen required for sats 90-95% -Continue Levaquin as ordered. -Prednisone 40 QD-  taper off over 2 weeks -Social Work/ Case Manager consult for issues with cost of medications.  Scribed by Kandice Robinsons, RN ACNP Student [employer] for Canary Brim, NP-C.  Canary Brim, NP-C [facility] Pgr: [phone] or [phone]   02/15/2013, 9:54 AM   STAFF NOTE: I, Dr Lavinia Sharps have personally reviewed patient's available data, including medical history, events of note, physical examination and test results as part of my evaluation. I have discussed with resident/NP and other care  providers such as pharmacist, RN and RRT.  In addition,  I personally evaluated patient and elicited key findings of new hilar mass. Very suspicious for small cell lung cancer. He is now disabled due to copd, cad,obesity. Will get social work to eval for [employer]. Will set up EBUS after PET scan ideally. Will have my office work on PET scan date. Based on that EBUS 02/28/13 scheduled provisionally at 7.30am. Will work on getting PET before that. REstart  plavix for now but dc on 02/21/13 for  EBUS. He is agreeable wit plan.  Rest per NP/medical resident whose note is outlined above and that I agree with   .  Dr. Kalman Shan, M.D., [facility].C.P Pulmonary and Critical Care Medicine Staff Physician [facility] Pager: [phone], If no answer or between  15:00h - 7:00h: call [phone]  02/15/2013 12:03 PM

## 2013-02-15 NOTE — Progress Notes (Signed)
  Pt admitted to the unit. Pt is stable, alert and oriented per baseline. Oriented to room, staff, and call bell. Educated to call for any assistance. Bed in lowest position, call bell within reach- will continue to monitor. 

## 2013-02-15 NOTE — Telephone Encounter (Signed)
Jennifer  Pleae hve  Libby/Rhonda work on a opd PET Scan for him asap. Need to know date of this PET scan. BAsed on this I need to get a EBUS at 7.30am on him this Monday 02/21/13 or the following 02/28/13.   Please let me know soon. OPD PET scan   Dr. Kalman Shan, M.D., [facility].C.P Pulmonary and Critical Care Medicine Staff Physician [facility] [place] [facility] Pager: [phone], If no answer or between  15:00h - 7:00h: call [phone]  02/15/2013 11:57 AM

## 2013-02-15 NOTE — Telephone Encounter (Signed)
Order placed. Jennifer Castillo, CMA  

## 2013-02-15 NOTE — H&P (Signed)
PCP:   Clydell Hakim, MD    Chief Complaint:   Shortness of breath and cough  HPI: [name] is a 57 y.o. male   has a past medical history of CAD (coronary artery disease); Diabetes mellitus type II; Hyperlipidemia; COPD (chronic obstructive pulmonary disease); CKD (chronic kidney disease); Obesity; History of CVA (cerebrovascular accident); Mass; and CHF (congestive heart failure).   Presented with   3 week hx of shortness of breath and cough. Coughing up clear sputum, no hemoptysis, no weight loss. He presented today to ER and was found to have to have left infra-hilarmass lesion with probable metastatic left hilar  and mediastinal adenopathy and mild postobstructive changes.  3 years ago he had resection of right upper lobe it was sent for biopsy which was nondiagnostic culture produced mycobacterium kansasii. RUL PET showed Hot interemediate prob nodule. Patient has been followed up for this by pulmonology. Patient states at home he uses nebulizers with good results but lately he has had more dyspnea. In emergency department patient was made n.p.o. for possible bronchoscopy today. I spoke today with pulmonology he was aware of the patient and will see him this morning.  Review of Systems:    Pertinent positives include: shortness of breath at rest. Wheezing clear sputum   Constitutional:  No weight loss, night sweats, Fevers, chills, fatigue, weight loss  HEENT:  No headaches, Difficulty swallowing,Tooth/dental problems,Sore throat,  No sneezing, itching, ear ache, nasal congestion, post nasal drip,  Cardio-vascular:  No chest pain, Orthopnea, PND, anasarca, dizziness, palpitations.no Bilateral lower extremity swelling  GI:  No heartburn, indigestion, abdominal pain, nausea, vomiting, diarrhea, change in bowel habits, loss of appetite, melena, blood in stool, hematemesis Resp:  no No dyspnea on exertion, No excess mucus, no productive cough,  No coughing up of  blood.No change in color of mucus.No wheezing. Skin:  no rash or lesions. No jaundice GU:  no dysuria, change in color of urine, no urgency or frequency. No straining to urinate.  No flank pain.  Musculoskeletal:  No joint pain or no joint swelling. No decreased range of motion. No back pain.  Psych:  No change in mood or affect. No depression or anxiety. No memory loss.  Neuro: no localizing neurological complaints, no tingling, no weakness, no double vision, no gait abnormality, no slurred speech, no confusion  Otherwise ROS are negative except for above, 10 systems were reviewed  Past Medical History: Past Medical History  Diagnosis Date  . CAD (coronary artery disease)     a. PCI 3/08 to OM2 and mid CFX; b.  NSTEMI 6/10:  Prior stents patent, 90% dCFX,  mRCA occl (old) with collats; EF 55% on LV-gram => Xience DES 2.5 x 23 mm to distal CFX;  c. abnl MV 12/13 => LHC 04/23/12:  pLAD 50%, oOM1 80%, long stented segment of the CFX extending into the PLOM with the PLOM totally occluded ostially, CFX 70% ISR, mCFX 80% ISR, pRCA chronically occluded. Med Rx planned  . Diabetes mellitus type II   . Hyperlipidemia   . COPD (chronic obstructive pulmonary disease)     Quit tobacco 09/2009; Gold Stage 2 with asthma component - FEV1 1.53 L/54%, 14% fev1 BD response, DLCO 54% - July 2011; MM genotype 01/07/10; unable to afford spiriva and unwilling to try ics due to prior renal failure: state to Dr. Marchelle Gearing, Aug 2011; started on atrovent HFA fall 2011. no desturation on walk test Dec 2011  . CKD (chronic kidney disease)   .  Obesity   . History of CVA (cerebrovascular accident)     Without residual defecits  . 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 no further rx  . CHF (congestive heart failure)    Past Surgical History  Procedure Laterality Date  . Wedge resection  03/28/2010  . Coronary stent placement       Medications: Prior to Admission medications   Medication Sig Start Date End Date Taking? Authorizing Provider  albuterol (PROAIR HFA) 108 (90 BASE) MCG/ACT inhaler Inhale 2 puffs into the lungs every 6 (six) hours as needed for wheezing or shortness of breath.   Yes Historical Provider, MD  albuterol (PROVENTIL) (2.5 MG/3ML) 0.083% nebulizer solution Take 2.5 mg by nebulization 5 (five) times daily as needed for wheezing or shortness of breath.   Yes Historical Provider, MD  amLODipine (NORVASC) 2.5 MG tablet Take 2.5 mg by mouth daily.   Yes Historical Provider, MD  aspirin EC 81 MG tablet Take 81 mg by mouth daily.   Yes Historical Provider, MD  atorvastatin (LIPITOR) 40 MG tablet Take 40 mg by mouth at bedtime.   Yes Historical Provider, MD  bisoprolol (ZEBETA) 5 MG tablet Take 5 mg by mouth daily.   Yes Historical Provider, MD  clopidogrel (PLAVIX) 75 MG tablet Take 1 tablet (75 mg total) by mouth daily. 12/23/12  Yes Laurey Morale, MD  Fluticasone Furoate-Vilanterol (BREO ELLIPTA) 100-25 MCG/INH AEPB Inhale 1 puff into the lungs 2 (two) times daily.   Yes Historical Provider, MD  glipiZIDE (GLUCOTROL) 10 MG tablet Take 10 mg by mouth 2 (two) times daily before a meal.    Yes Historical Provider, MD  isosorbide mononitrate (IMDUR) 60 MG 24 hr tablet Take 1 tablet (60 mg total) by mouth daily. 08/10/12  Yes Laurey Morale, MD  losartan (COZAAR) 50 MG tablet Take 1 tablet (50 mg total) by mouth daily. 08/10/12  Yes Laurey Morale, MD  metFORMIN (GLUCOPHAGE-XR) 750 MG 24 hr tablet Take 1,500 mg by mouth daily with breakfast.   Yes Historical Provider, MD  nitroGLYCERIN (NITROSTAT) 0.4 MG SL tablet Place 0.4 mg under the tongue every 5 (five) minutes as needed for chest pain. May  repeat up to 3 doses   Yes Historical Provider, MD    Allergies:   Allergies  Allergen Reactions  . Erythromycin Other (See Comments)    Reaction while in hospital - pt doesn't know reaction  . Penicillins Other (See Comments)    Childhood reaction - unknown    Social History:  Ambulatory  independently   Lives at  home   reports that he quit smoking about 3 years ago. He does not have any smokeless tobacco history on file. He reports that he does not drink alcohol or use illicit drugs.   Family History: family history includes Cancer in his maternal uncle; Diabetes in his mother; Stroke in his other.    Physical Exam: Patient Vitals for the past 24 hrs:  BP Temp Temp src Pulse Resp SpO2 Height Weight  02/15/13 0336 - - - - - 98 % - -  02/15/13 0205 123/77 mmHg 98.7 F (37.1 C) Oral 99 24 99 % 5\' 10"  (1.778  m) 104.9 kg (231 lb 4.2 oz)  02/15/13 0000 134/68 mmHg - - 104 22 92 % - -  02/14/13 2300 126/63 mmHg - - 90 23 98 % - -  02/14/13 2221 - - - - - 96 % - -  02/14/13 2111 - - - - - 95 % - -  02/14/13 2100 119/74 mmHg - - 92 20 95 % - -  02/14/13 2042 107/67 mmHg 98.1 F (36.7 C) Oral - 18 95 % - -  02/14/13 1951 109/67 mmHg 97.9 F (36.6 C) Oral 81 24 94 % 5\' 10"  (1.778 m) 99.791 kg (220 lb)    1. General:  in No Acute distress 2. Psychological: Alert and Oriented 3. Head/ENT:   Moist  Mucous Membranes                          Head Non traumatic, neck supple                          Normal  Dentition 4. SKIN: normal  Skin turgor,  Skin clean Dry and intact no rash 5. Heart: Regular rate and rhythm no Murmur, Rub or gallop 6. Lungs:   Wheezes bilaterally no crackles   7. Abdomen: Soft, non-tender, Non distended 8. Lower extremities: no clubbing, cyanosis, or edema 9. Neurologically Grossly intact, moving all 4 extremities equally 10. MSK: Normal range of motion  body mass index is 33.18 kg/(m^2).   Labs on Admission:   Recent Labs  02/14/13 1953  02/14/13 2112  NA 142 140  K 4.5 4.0  CL 105 103  CO2 26  --   GLUCOSE 99 96  BUN 22 23  CREATININE 1.53* 1.50*  CALCIUM 9.2  --    No results found for this basename: AST, ALT, ALKPHOS, BILITOT, PROT, ALBUMIN,  in the last 72 hours No results found for this basename: LIPASE, AMYLASE,  in the last 72 hours  Recent Labs  02/14/13 1953 02/14/13 2112  WBC 8.7  --   HGB 14.1 14.6  HCT 40.9 43.0  MCV 94.0  --   PLT 222  --    No results found for this basename: CKTOTAL, CKMB, CKMBINDEX, TROPONINI,  in the last 72 hours No results found for this basename: TSH, T4TOTAL, FREET3, T3FREE, THYROIDAB,  in the last 72 hours No results found for this basename: VITAMINB12, FOLATE, FERRITIN, TIBC, IRON, RETICCTPCT,  in the last 72 hours Lab Results  Component Value Date   HGBA1C  Value: 7.0 (NOTE)                                                                       According to the ADA Clinical Practice Recommendations for 2011, when HbA1c is used as a screening test:   >=6.5%   Diagnostic of Diabetes Mellitus           (if abnormal result  is confirmed)  5.7-6.4%   Increased risk of developing Diabetes Mellitus  References:Diagnosis and Classification of Diabetes Mellitus,Diabetes Care,2011,34(Suppl 1):S62-S69 and Standards of Medical Care in         Diabetes - 2011,Diabetes Care,2011,34  (Suppl 1):S11-S61.* 10/07/2009  Estimated Creatinine Clearance: 65.9 ml/min (by C-G formula based on Cr of 1.5). ABG    Component Value Date/Time   PHART 7.295* 03/30/2010 0840   HCO3 23.9 03/30/2010 0840   TCO2 28 02/14/2013 2112   ACIDBASEDEF 3.0* 03/30/2010 0840   O2SAT 94.0 03/30/2010 0840     No results found for this basename: DDIMER     Other results:  I have pearsonaly reviewed this: ECG REPORT  Rate: 91  Rhythm: NSR ST&T Change: no ischemic changes   Cultures:    Component Value Date/Time   SDES TISSUE LUNG 03/29/2010 1035   SDES TISSUE LUNG 03/29/2010 1035   SDES TISSUE LUNG  03/29/2010 1035   SPECREQUEST S2A11 5868 03/29/2010 1035   SPECREQUEST S2A11 5868 03/29/2010 1035   SPECREQUEST S2A11 5868 03/29/2010 1035   CULT MYCOBACTERIUM KANSASII 03/29/2010 1035   CULT NO GROWTH 3 DAYS 03/29/2010 1035   CULT No Fungi Isolated in 4 Weeks 03/29/2010 1035   REPTSTATUS 06/05/2010 FINAL 03/29/2010 1035   REPTSTATUS 04/01/2010 FINAL 03/29/2010 1035   REPTSTATUS 04/28/2010 FINAL 03/29/2010 1035       Radiological Exams on Admission: Dg Chest 2 View  02/14/2013   *RADIOLOGY REPORT*  Clinical Data: Initial encounter for 3-week history of shortness of breath, productive cough, and chest pain related to the cough. Former smoker who quit approximately 5 years ago.  Current history of COPD.  Prior history of wedge resection of a right upper lobe mass in September, 2011, pathology revealing necrotizing granulomas.  CHEST - 2 VIEW  Comparison: Two-view chest x-ray 06/12/2010, 05/01/2010, 02/05/2010.  PET CT 01/17/2010.  Findings: Interval development of a mass in the central left upper lobe in the vicinity of the left hilum.  The mass approximates 7.4 x 4.6 cm on the PA image, but is difficult to measure on the lateral image.  Cardiac silhouette normal in size, unchanged. Hilar and mediastinal contours otherwise unremarkable.  Minimal streaky airspace opacity in the left upper lobe.  Postsurgical changes in the right upper lobe with pleuroparenchymal scarring at the right base, unchanged.  No new abnormalities in the right lung. No pleural effusions.  Degenerative changes involving the thoracic spine.  IMPRESSION: Large mass involving the central left upper lobe, worrisome for a bronchogenic carcinoma.  Mild post-obstructive atelectasis and/or pneumonitis in the left upper lobe.   Original Report Authenticated By: Hulan Saas, M.D.   Ct Chest W Contrast  02/14/2013   *RADIOLOGY REPORT*  Clinical Data: Dyspnea, mass seen on chest x-ray  CT CHEST WITH CONTRAST  Technique:  Multidetector  CT imaging of the chest was performed following the standard protocol during bolus administration of intravenous contrast.  Contrast: 75mL OMNIPAQUE IOHEXOL 300 MG/ML SOLN  Comparison: Chest x-ray earlier today at 2028 hours; prior chest CT 01/03/2010; prior PET CT 01/17/2010  Findings:  Mediastinum: Bulky left hilar and mediastinal adenopathy.  An index prevascular lymph node measures 3.2 x 1.7 cm.  A left hilar nodal mass adjacent to the main pulmonary artery measures 3.5 x 2.6 cm. Left hilar nodal conglomerate posterior to the left main pulmonary artery measures 4.2 x 2.3 cm.  Left infrahilar mass like no conglomeration measures 5.5 x 4.1 cm.  There is associated mass effect on the left main pulmonary artery and the lower lobe branches.  No definite supraclavicular or right hilar adenopathy. Nonspecific subcarinal node measures 12 mm in short axis.  Para esophageal nodes have been present dating back to 2011 and likely benign.  Heart/Vascular: Conventional three-vessel  arch anatomy.  The heart is within normal limits for size.  Atherosclerotic calcifications noted throughout the coronary arteries.  No pericardial effusion. No large central PE.  Lungs/Pleura:  The advanced centrilobular emphysema.  Cough, bronchovascular distribution of tree in bud micronodularity extending in the anterobasal segment the left lower lobe inferior to the left infrahilar mass lesion.  Findings are most consistent with postobstructive changes.  Surgical changes in the right upper lung consistent with prior wedge resection.  Upper Abdomen: 4 mm hypoattenuating focus in hepatic segment to just inferior to the diaphragm is too small to accurately characterize.  Otherwise, no focal hepatic lesion or upper abdominal evidence of metastatic disease.  Mild fatty atrophy of the pancreas.  Scattered atherosclerotic vascular calcifications.  Bones: No acute fracture or aggressive appearing lytic or blastic osseous lesion.  Multiple remote healed  right-sided rib fractures.  IMPRESSION:  1.  Left infrahilar mass lesion with probable metastatic left hilar and mediastinal adenopathy and mild postobstructive changes distally in the anterobasal segment of the left lower lung. Findings are highly concerning for primary bronchogenic carcinoma such as small cell lung cancer. Recommend further evaluation with outpatient PET CT followed by endobronchial biopsy.  If endobronchial biopsy is nondiagnostic, CT guided biopsy could be considered.  2.  Severe centrilobular pulmonary emphysema.  3.  Atherosclerosis including coronary artery disease  4.  Small 4 mm low attenuation lesion in the liver is nonspecific and warrants attention on follow-up imaging.   Original Report Authenticated By: Malachy Moan, M.D.    Chart has been reviewed  Assessment/Plan  57 yo M w hx of COPD now with new lung Mass  Present on Admission:  . Lung mass - pulmonology is aware and will see patient in AM, for now will make NPO and hold plavix in case he will need any procedures. Would need to restart when it is safe given hx of CAD.  Marland Kitchen HYPERTENSION, UNSPECIFIED - continue home medication once have a diet . DM - hold PO meds while NPO, SSI CAD - sp NSTEMI in June 2010 sp DES, now medically managed by [facility] cardiology. Prophylaxis: Lovenox   CODE STATUS: FULL CODE  Other plan as per orders.  I have spent a total of 55 min on this admission  Del Overfelt 02/15/2013, 5:32 AM

## 2013-02-16 DIAGNOSIS — J441 Chronic obstructive pulmonary disease with (acute) exacerbation: Secondary | ICD-10-CM

## 2013-02-16 DIAGNOSIS — G548 Other nerve root and plexus disorders: Secondary | ICD-10-CM

## 2013-02-16 DIAGNOSIS — E8881 Metabolic syndrome: Secondary | ICD-10-CM

## 2013-02-16 LAB — TSH: TSH: 0.597 u[IU]/mL (ref 0.350–4.500)

## 2013-02-16 LAB — MAGNESIUM: Magnesium: 2.1 mg/dL (ref 1.5–2.5)

## 2013-02-16 LAB — LEGIONELLA ANTIGEN, URINE

## 2013-02-16 LAB — COMPREHENSIVE METABOLIC PANEL
ALT: 18 U/L (ref 0–53)
AST: 13 U/L (ref 0–37)
BUN: 30 mg/dL — ABNORMAL HIGH (ref 6–23)
CO2: 24 mEq/L (ref 19–32)
Calcium: 9.1 mg/dL (ref 8.4–10.5)
GFR calc Af Amer: 58 mL/min — ABNORMAL LOW (ref >90)
Sodium: 133 mEq/L — ABNORMAL LOW (ref 135–145)
Total Bilirubin: 0.3 mg/dL (ref 0.3–1.2)
Total Protein: 6.9 g/dL (ref 6.0–8.3)

## 2013-02-16 LAB — CBC
HCT: 37.6 % — ABNORMAL LOW (ref 39.0–52.0)
Hemoglobin: 13.6 g/dL (ref 13.0–17.0)
MCH: 33.5 pg (ref 26.0–34.0)
MCHC: 36.2 g/dL — ABNORMAL HIGH (ref 30.0–36.0)
Platelets: 193 10*3/uL (ref 150–400)
RBC: 4.06 MIL/uL — ABNORMAL LOW (ref 4.22–5.81)

## 2013-02-16 LAB — PROTIME-INR: INR: 1.04 (ref 0.00–1.49)

## 2013-02-16 LAB — GLUCOSE, CAPILLARY: Glucose-Capillary: 242 mg/dL — ABNORMAL HIGH (ref 70–99)

## 2013-02-16 MED ORDER — LEVOFLOXACIN 750 MG PO TABS: 750.0000 mg | ORAL_TABLET | Freq: Every day | ORAL | Status: DC

## 2013-02-16 MED ORDER — ALBUTEROL SULFATE (2.5 MG/3ML) 0.083% IN NEBU: 2.5000 mg | INHALATION_SOLUTION | Freq: Every day | RESPIRATORY_TRACT | Status: DC | PRN

## 2013-02-16 MED ORDER — CLOPIDOGREL BISULFATE 75 MG PO TABS: 75.0000 mg | ORAL_TABLET | Freq: Every day | ORAL | Status: DC

## 2013-02-16 MED ORDER — FLUTICASONE FUROATE-VILANTEROL 100-25 MCG/INH IN AEPB: 1.0000 | INHALATION_SPRAY | Freq: Two times a day (BID) | RESPIRATORY_TRACT | Status: DC

## 2013-02-16 MED ORDER — DSS 100 MG PO CAPS: 100.0000 mg | ORAL_CAPSULE | Freq: Two times a day (BID) | ORAL | Status: DC

## 2013-02-16 MED ORDER — PREDNISONE 5 MG PO TABS: ORAL_TABLET | ORAL | Status: DC

## 2013-02-16 NOTE — Progress Notes (Signed)
Inpatient Diabetes Program Recommendations  AACE/ADA: New Consensus Statement on Inpatient Glycemic Control (2013)  Target Ranges:  Prepandial:   less than 140 mg/dL      Peak postprandial:   less than 180 mg/dL (1-2 hours)      Critically ill patients:  140 - 180 mg/dL   Results for [name], [name] (MRN [id number]) as of 02/16/2013 10:10  Ref. Range 02/15/2013 07:34 02/15/2013 11:31 02/15/2013 16:32 02/15/2013 20:53 02/16/2013 07:59  Glucose-Capillary Latest Range: 70-99 mg/dL 409 (H) 811 (H) 914 (H) 353 (H) 242 (H)    Inpatient Diabetes Program Recommendations Insulin - Basal: Please consider increasing Lantus to 12 units daily. Correction (SSI): Please consider increasing Novolog correction scale to moderate scale and add Novolog bedtime correction scale.  Note: Patient has a history of diabetes and takes Glipizide 10 mg BID and Metformin 1500 mg QAM as an outpatient for diabetes management.  Currently, patient is ordered to receive Lantus 10 units daily and Novolog 0-9 units AC for inpatient glycemic control.  Patient is receiving Solumedrol 60 mg Q12H which is contributing to hyperglycemia.  Blood glucose ranged from 287-353 mg/dl yesterday and fasting blood glucose this morning was 242 mg/dl.  Please consider increasing Lantus to 12 units, increasing Novolog correction scale to moderate scale, and adding Novolog bedtime correction scale.  Will continue to follow.  Thanks, Orlando Penner, RN, MSN, CCRN Diabetes Coordinator Inpatient Diabetes Program [phone] (Team Pager) [phone] ([facility] office) [phone] [facility] office)

## 2013-02-16 NOTE — Plan of Care (Signed)
Problem: Discharge Progression Outcomes Goal: Complications resolved/controlled Will follow up with specialist next week

## 2013-02-16 NOTE — Progress Notes (Signed)
PULMONARY  / CRITICAL CARE MEDICINE  Name: [name] MRN: [id number] DOB: April 30, 1956    ADMISSION DATE:  02/14/2013 CONSULTATION DATE:  02/15/13  REFERRING MD :  Adela Glimpse PRIMARY SERVICE:  [facility]  CHIEF COMPLAINT: Worsening SOB with CT confirmation of Left Infrahilar Mass  BRIEF PATIENT DESCRIPTION:  57 y/o M admitted on 10/6 with worsening DOE in setting of Gold II COPD.  Work up demonstrated L infrahilar mass concerning for carcinoma with possible mets. PCCM consulted for further eval.    SIGNIFICANT EVENTS / STUDIES:  02/25/13: CT Chest: Left infrahilar mass lesion with probable metastatic left hilar and mediastinal adenopathy and mild postobstructive changes distally in the anterobasal segment of the left lower lung. Findings are highly concerning for primary bronchogenic carcinoma such as small cell lung cancer. Severe centrilobular pulmonary emphysema.  CULTURES: 10/07 Blood x 2>>>  ANTIBIOTICS: 10/07 Levaquin>>>  SUBJECTIVE: "Feeling much better".  VITAL SIGNS: Temp:  [97.8 F (36.6 C)-98.6 F (37 C)] 97.9 F (36.6 C) (10/08 0400) Pulse Rate:  [76-93] 86 (10/08 0400) Resp:  [18-24] 24 (10/08 0400) BP: (115-140)/(67-85) 125/85 mmHg (10/08 0400) SpO2:  [91 %-100 %] 100 % (10/08 0400)  PHYSICAL EXAMINATION: General:  Alert and appropriate, conversational, in NAD. Neuro:   Grossly intact,moving all extremities equally.  HEENT:  MM pink and moist, atraumatic, normocephalic,adequate dentition Neck:  Supple,no lymphadenopathy. Cardiovascular:  S1, S2, No rubs or murmurs, RRR Lungs: faint wheeze, Diminished in the bases, L> R. Abdomen:  Obese, BS present, non-tender, non-distended. Musculoskeletal:  No cyanosis, edema, good strength. Skin: normal skin turgor, dry and intact with no rash.   Recent Labs Lab 02/14/13 1953 02/14/13 2112 02/16/13 0600  NA 142 140 133*  K 4.5 4.0 4.5  CL 105 103 97  CO2 26  --  24  BUN 22 23 30*  CREATININE 1.53* 1.50* 1.49*   GLUCOSE 99 96 248*    Recent Labs Lab 02/14/13 1953 02/14/13 2112 02/16/13 0600  HGB 14.1 14.6 13.6  HCT 40.9 43.0 37.6*  WBC 8.7  --  11.3*  PLT 222  --  193   Dg Chest 2 View  02/14/2013   *RADIOLOGY REPORT*  Clinical Data: Initial encounter for 3-week history of shortness of breath, productive cough, and chest pain related to the cough. Former smoker who quit approximately 5 years ago.  Current history of COPD.  Prior history of wedge resection of a right upper lobe mass in September, 2011, pathology revealing necrotizing granulomas.  CHEST - 2 VIEW  Comparison: Two-view chest x-ray 06/12/2010, 05/01/2010, 02/05/2010.  PET CT 01/17/2010.  Findings: Interval development of a mass in the central left upper lobe in the vicinity of the left hilum.  The mass approximates 7.4 x 4.6 cm on the PA image, but is difficult to measure on the lateral image.  Cardiac silhouette normal in size, unchanged. Hilar and mediastinal contours otherwise unremarkable.  Minimal streaky airspace opacity in the left upper lobe.  Postsurgical changes in the right upper lobe with pleuroparenchymal scarring at the right base, unchanged.  No new abnormalities in the right lung. No pleural effusions.  Degenerative changes involving the thoracic spine.  IMPRESSION: Large mass involving the central left upper lobe, worrisome for a bronchogenic carcinoma.  Mild post-obstructive atelectasis and/or pneumonitis in the left upper lobe.   Original Report Authenticated By: Hulan Saas, M.D.   Ct Chest W Contrast  02/14/2013   *RADIOLOGY REPORT*  Clinical Data: Dyspnea, mass seen on chest x-ray  CT  CHEST WITH CONTRAST  Technique:  Multidetector CT imaging of the chest was performed following the standard protocol during bolus administration of intravenous contrast.  Contrast: 75mL OMNIPAQUE IOHEXOL 300 MG/ML SOLN  Comparison: Chest x-ray earlier today at 2028 hours; prior chest CT 01/03/2010; prior PET CT 01/17/2010  Findings:   Mediastinum: Bulky left hilar and mediastinal adenopathy.  An index prevascular lymph node measures 3.2 x 1.7 cm.  A left hilar nodal mass adjacent to the main pulmonary artery measures 3.5 x 2.6 cm. Left hilar nodal conglomerate posterior to the left main pulmonary artery measures 4.2 x 2.3 cm.  Left infrahilar mass like no conglomeration measures 5.5 x 4.1 cm.  There is associated mass effect on the left main pulmonary artery and the lower lobe branches.  No definite supraclavicular or right hilar adenopathy. Nonspecific subcarinal node measures 12 mm in short axis.  Para esophageal nodes have been present dating back to 2011 and likely benign.  Heart/Vascular: Conventional three-vessel arch anatomy.  The heart is within normal limits for size.  Atherosclerotic calcifications noted throughout the coronary arteries.  No pericardial effusion. No large central PE.  Lungs/Pleura:  The advanced centrilobular emphysema.  Cough, bronchovascular distribution of tree in bud micronodularity extending in the anterobasal segment the left lower lobe inferior to the left infrahilar mass lesion.  Findings are most consistent with postobstructive changes.  Surgical changes in the right upper lung consistent with prior wedge resection.  Upper Abdomen: 4 mm hypoattenuating focus in hepatic segment to just inferior to the diaphragm is too small to accurately characterize.  Otherwise, no focal hepatic lesion or upper abdominal evidence of metastatic disease.  Mild fatty atrophy of the pancreas.  Scattered atherosclerotic vascular calcifications.  Bones: No acute fracture or aggressive appearing lytic or blastic osseous lesion.  Multiple remote healed right-sided rib fractures.  IMPRESSION:  1.  Left infrahilar mass lesion with probable metastatic left hilar and mediastinal adenopathy and mild postobstructive changes distally in the anterobasal segment of the left lower lung. Findings are highly concerning for primary bronchogenic  carcinoma such as small cell lung cancer. Recommend further evaluation with outpatient PET CT followed by endobronchial biopsy.  If endobronchial biopsy is nondiagnostic, CT guided biopsy could be considered.  2.  Severe centrilobular pulmonary emphysema.  3.  Atherosclerosis including coronary artery disease  4.  Small 4 mm low attenuation lesion in the liver is nonspecific and warrants attention on follow-up imaging.   Original Report Authenticated By: Malachy Moan, M.D.    ASSESSMENT / PLAN:  ADoylene Canning Stage 2 COPD with new findings of Left intra hilar lung mass per CT with concern for post obstructive process likely.  Hx of Wedge Resection 2011.   P: -office scheduling PET Scan prior to 02/28/13 EBUS date, they will contact patient -EBUS planned 10/20 @7 :30 am to obtain tissue sample for diagnosis  -Hold  Plavix (stent / CAD) for pending procedure starting 02/21/13 (d/w cards) -Adjust nebs to Q6h albuterol/atrovent  + pulmicort neb bid -Wean oxygen for sats 90-95% -Continue Levaquin x 7 days total abx -Prednisone 40 QD- taper off over 2 weeks -Social Work/ Case Manager consult for issues with cost of medications / insurance. -ambulatory desat prior to discharge  PCCM will be available PRN.  Please call for questions / concerns.    Canary Brim, NP-C [facility] Pgr: [phone] or [phone]   02/16/2013, 9:25 AM  I have fully examined this patient and agree with above findings.  And edite dinfull  Mcarthur Rossetti. Tyson Alias, MD, FACP Pgr: [phone] [facility]

## 2013-02-16 NOTE — Progress Notes (Signed)
SATURATION QUALIFICATIONS: (This note is used to comply with regulatory documentation for home oxygen)  Patient Saturations on Room Air at Rest =90%  Patient Saturations on Room Air while Ambulating = 83%  Patient Saturations on 2Liters of oxygen while Ambulating = 93%  Please briefly explain why patient needs home oxygen: 

## 2013-02-16 NOTE — Discharge Summary (Signed)
[facility]                                                                                   [name], is a 57 y.o. male  DOB 1956-02-27  MRN [id number].  Admission date:  02/14/2013  Admitting Physician  Eduard Clos, MD  Discharge Date:  02/16/2013   Primary MD  Clydell Hakim, MD  Admission Diagnosis  Lung mass [786.6]  Discharge Diagnosis     Active Problems:   DM   HYPERTENSION, UNSPECIFIED   Lung mass  Acute Resp failure  Past Medical History  Diagnosis Date  . CAD (coronary artery disease)     a. PCI 3/08 to OM2 and mid CFX; b.  NSTEMI 6/10:  Prior stents patent, 90% dCFX,  mRCA occl (old) with collats; EF 55% on LV-gram => Xience DES 2.5 x 23 mm to distal CFX;  c. abnl MV 12/13 => LHC 04/23/12:  pLAD 50%, oOM1 80%, long stented segment of the CFX extending into the PLOM with the PLOM totally occluded ostially, CFX 70% ISR, mCFX 80% ISR, pRCA chronically occluded. Med Rx planned  . Diabetes mellitus type II   . Hyperlipidemia   . COPD (chronic obstructive pulmonary disease)     Quit tobacco 09/2009; Gold Stage 2 with asthma component - FEV1 1.53 L/54%, 14% fev1 BD response, DLCO 54% - July 2011; MM genotype 01/07/10; unable to afford spiriva and unwilling to try ics due to prior renal failure: state to Dr. Marchelle Gearing, Aug 2011; started on atrovent HFA fall 2011. no desturation on walk test Dec 2011  . CKD (chronic kidney disease)   . Obesity   . History of CVA (cerebrovascular accident)     Without residual defecits  . 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 no further rx  . CHF (congestive heart failure)     Past Surgical History  Procedure Laterality Date   . Wedge resection  03/28/2010  . Coronary stent placement       Recommendations for primary care physician for things to follow:       Discharge Diagnoses:   Active Problems:   DM   HYPERTENSION, UNSPECIFIED   Lung mass    Discharge Condition: stable   Follow-up Information   Follow up with Clydell Hakim, MD. Schedule an appointment as soon as possible for a visit in 3 days.   Specialty:  Family Medicine   Contact information:   [address] [place] [address] [phone]       Call Kalman Shan, MD. (And confirm your PET scan and bronchoscopy schedule)    Specialty:  Pulmonary Disease   Contact information:   [address] [place] [address] [phone]       Follow up with [name], MD. Schedule an appointment as soon as possible for a visit in 1 week.  Specialty:  Cardiology   Contact information:   [address] [place] [address] [phone]         Consults obtained - PCCM   Discharge Medications      Medication List         amLODipine 2.5 MG tablet  Commonly known as:  NORVASC  Take 2.5 mg by mouth daily.     aspirin EC 81 MG tablet  Take 81 mg by mouth daily.     atorvastatin 40 MG tablet  Commonly known as:  LIPITOR  Take 40 mg by mouth at bedtime.     bisoprolol 5 MG tablet  Commonly known as:  ZEBETA  Take 5 mg by mouth daily.     clopidogrel 75 MG tablet  Commonly known as:  PLAVIX  Take 1 tablet (75 mg total) by mouth daily. Resume once cleared by the lung doctor after bronchoscopy and lung biopsy  Start taking on:  02/21/2013     DSS 100 MG Caps  Take 100 mg by mouth 2 (two) times daily.     Fluticasone Furoate-Vilanterol 100-25 MCG/INH Aepb  Commonly known as:  BREO ELLIPTA  Inhale 1 puff into the lungs 2 (two) times daily.     glipiZIDE 10 MG tablet  Commonly known as:  GLUCOTROL  Take 10 mg by mouth 2 (two) times daily before a meal.     isosorbide  mononitrate 60 MG 24 hr tablet  Commonly known as:  IMDUR  Take 1 tablet (60 mg total) by mouth daily.     levofloxacin 750 MG tablet  Commonly known as:  LEVAQUIN  Take 1 tablet (750 mg total) by mouth daily.     losartan 50 MG tablet  Commonly known as:  COZAAR  Take 1 tablet (50 mg total) by mouth daily.     metFORMIN 750 MG 24 hr tablet  Commonly known as:  GLUCOPHAGE-XR  Take 1,500 mg by mouth daily with breakfast.     nitroGLYCERIN 0.4 MG SL tablet  Commonly known as:  NITROSTAT  Place 0.4 mg under the tongue every 5 (five) minutes as needed for chest pain. May repeat up to 3 doses     predniSONE 5 MG tablet  Commonly known as:  DELTASONE  Label  & dispense according to the schedule below. 10 Pills PO for 3 days then, 8 Pills PO for 3 days, 6 Pills PO for 3 days, 4 Pills PO for 3 days, 2 Pills PO for 3 days, 1 Pills PO for 3 days, 1/2 Pill  PO for 3 days then STOP. Total 95 pills.     albuterol (2.5 MG/3ML) 0.083% nebulizer solution  Commonly known as:  PROVENTIL  Take 3 mLs (2.5 mg total) by nebulization 5 (five) times daily as needed for wheezing or shortness of breath.     PROAIR HFA 108 (90 BASE) MCG/ACT inhaler  Generic drug:  albuterol  Inhale 2 puffs into the lungs every 6 (six) hours as needed for wheezing or shortness of breath.         Diet and Activity recommendation: See Discharge Instructions below   Discharge Instructions     Follow with Primary MD Clydell Hakim, MD in 3 days   Get CBC, CMP, checked 3 days by Primary MD and again as instructed by your Primary MD.   Get Medicines reviewed and adjusted.  Please request your Prim.MD to go over all Hospital Tests and Procedure/Radiological results at the follow  up, please get all Hospital records sent to your Prim MD by signing hospital release before you go home.  Activity: As tolerated with Full fall precautions use walker/cane & assistance as needed   Diet: Heart Healthy - Low Carb  For  Heart failure patients - Check your Weight same time everyday, if you gain over 2 pounds, or you develop in leg swelling, experience more shortness of breath or chest pain, call your Primary MD immediately. Follow Cardiac Low Salt Diet and 1.8 lit/day fluid restriction.  Disposition Home    If you experience worsening of your admission symptoms, develop shortness of breath, life threatening emergency, suicidal or homicidal thoughts you must seek medical attention immediately by calling 911 or calling your MD immediately  if symptoms less severe.  You Must read complete instructions/literature along with all the possible adverse reactions/side effects for all the Medicines you take and that have been prescribed to you. Take any new Medicines after you have completely understood and accpet all the possible adverse reactions/side effects.   Do not drive and provide baby sitting services if your were admitted for syncope or siezures until you have seen by Primary MD or a Neurologist and advised to do so again.  Do not drive when taking Pain medications.    Do not take more than prescribed Pain, Sleep and Anxiety Medications  Special Instructions: If you have smoked or chewed Tobacco  in the last 2 yrs please stop smoking, stop any regular Alcohol  and or any Recreational drug use.  Wear Seat belts while driving.   Please note  You were cared for by a hospitalist during your hospital stay. If you have any questions about your discharge medications or the care you received while you were in the hospital after you are discharged, you can call the unit and asked to speak with the hospitalist on call if the hospitalist that took care of you is not available. Once you are discharged, your primary care physician will handle any further medical issues. Please note that NO REFILLS for any discharge medications will be authorized once you are discharged, as it is imperative that you return to your primary  care physician (or establish a relationship with a primary care physician if you do not have one) for your aftercare needs so that they can reassess your need for medications and monitor your lab values.    Major procedures and Radiology Reports - PLEASE review detailed and final reports for all details, in brief -       Dg Chest 2 View  02/14/2013   *RADIOLOGY REPORT*  Clinical Data: Initial encounter for 3-week history of shortness of breath, productive cough, and chest pain related to the cough. Former smoker who quit approximately 5 years ago.  Current history of COPD.  Prior history of wedge resection of a right upper lobe mass in September, 2011, pathology revealing necrotizing granulomas.  CHEST - 2 VIEW  Comparison: Two-view chest x-ray 06/12/2010, 05/01/2010, 02/05/2010.  PET CT 01/17/2010.  Findings: Interval development of a mass in the central left upper lobe in the vicinity of the left hilum.  The mass approximates 7.4 x 4.6 cm on the PA image, but is difficult to measure on the lateral image.  Cardiac silhouette normal in size, unchanged. Hilar and mediastinal contours otherwise unremarkable.  Minimal streaky airspace opacity in the left upper lobe.  Postsurgical changes in the right upper lobe with pleuroparenchymal scarring at the right base, unchanged.  No new  abnormalities in the right lung. No pleural effusions.  Degenerative changes involving the thoracic spine.  IMPRESSION: Large mass involving the central left upper lobe, worrisome for a bronchogenic carcinoma.  Mild post-obstructive atelectasis and/or pneumonitis in the left upper lobe.   Original Report Authenticated By: Hulan Saas, M.D.   Ct Chest W Contrast  02/14/2013   *RADIOLOGY REPORT*  Clinical Data: Dyspnea, mass seen on chest x-ray  CT CHEST WITH CONTRAST  Technique:  Multidetector CT imaging of the chest was performed following the standard protocol during bolus administration of intravenous contrast.  Contrast:  75mL OMNIPAQUE IOHEXOL 300 MG/ML SOLN  Comparison: Chest x-ray earlier today at 2028 hours; prior chest CT 01/03/2010; prior PET CT 01/17/2010  Findings:  Mediastinum: Bulky left hilar and mediastinal adenopathy.  An index prevascular lymph node measures 3.2 x 1.7 cm.  A left hilar nodal mass adjacent to the main pulmonary artery measures 3.5 x 2.6 cm. Left hilar nodal conglomerate posterior to the left main pulmonary artery measures 4.2 x 2.3 cm.  Left infrahilar mass like no conglomeration measures 5.5 x 4.1 cm.  There is associated mass effect on the left main pulmonary artery and the lower lobe branches.  No definite supraclavicular or right hilar adenopathy. Nonspecific subcarinal node measures 12 mm in short axis.  Para esophageal nodes have been present dating back to 2011 and likely benign.  Heart/Vascular: Conventional three-vessel arch anatomy.  The heart is within normal limits for size.  Atherosclerotic calcifications noted throughout the coronary arteries.  No pericardial effusion. No large central PE.  Lungs/Pleura:  The advanced centrilobular emphysema.  Cough, bronchovascular distribution of tree in bud micronodularity extending in the anterobasal segment the left lower lobe inferior to the left infrahilar mass lesion.  Findings are most consistent with postobstructive changes.  Surgical changes in the right upper lung consistent with prior wedge resection.  Upper Abdomen: 4 mm hypoattenuating focus in hepatic segment to just inferior to the diaphragm is too small to accurately characterize.  Otherwise, no focal hepatic lesion or upper abdominal evidence of metastatic disease.  Mild fatty atrophy of the pancreas.  Scattered atherosclerotic vascular calcifications.  Bones: No acute fracture or aggressive appearing lytic or blastic osseous lesion.  Multiple remote healed right-sided rib fractures.  IMPRESSION:  1.  Left infrahilar mass lesion with probable metastatic left hilar and mediastinal  adenopathy and mild postobstructive changes distally in the anterobasal segment of the left lower lung. Findings are highly concerning for primary bronchogenic carcinoma such as small cell lung cancer. Recommend further evaluation with outpatient PET CT followed by endobronchial biopsy.  If endobronchial biopsy is nondiagnostic, CT guided biopsy could be considered.  2.  Severe centrilobular pulmonary emphysema.  3.  Atherosclerosis including coronary artery disease  4.  Small 4 mm low attenuation lesion in the liver is nonspecific and warrants attention on follow-up imaging.   Original Report Authenticated By: Malachy Moan, M.D.    Micro Results      Recent Results (from the past 240 hour(s))  CULTURE, BLOOD (ROUTINE X 2)     Status: None   Collection Time    02/15/13  8:10 AM      Result Value Range Status   Specimen Description BLOOD LEFT ARM   Final   Special Requests BOTTLES DRAWN AEROBIC ONLY 10CC   Final   Culture  Setup Time     Final   Value: 02/15/2013 13:57     Performed at [facility]  Final   Value:        BLOOD CULTURE RECEIVED NO GROWTH TO DATE CULTURE WILL BE HELD FOR 5 DAYS BEFORE ISSUING A FINAL NEGATIVE REPORT     Performed at [facility]   Report Status PENDING   Incomplete  CULTURE, BLOOD (ROUTINE X 2)     Status: None   Collection Time    02/15/13  8:18 AM      Result Value Range Status   Specimen Description BLOOD LEFT HAND   Final   Special Requests BOTTLES DRAWN AEROBIC ONLY 10CC   Final   Culture  Setup Time     Final   Value: 02/15/2013 13:57     Performed at [facility]   Culture     Final   Value:        BLOOD CULTURE RECEIVED NO GROWTH TO DATE CULTURE WILL BE HELD FOR 5 DAYS BEFORE ISSUING A FINAL NEGATIVE REPORT     Performed at [facility]   Report Status PENDING   Incomplete     History of present illness and  Hospital Course:     Kindly see H&P for history of present illness and admission  details, please review complete Labs, Consult reports and Test reports for all details in brief [name], is a 57 y.o. male, patient with history of with history of CAD, PCI, DM 2, HL, COPD, former smoker, RUL resection-mycobacterium kansasii admitted with 3 weeks history of progressive dyspnea, nonproductive cough but no chest pain or fevers. In the ED, CT chest showed a left infrahilar mass concerning for carcinoma with possible metastases, he was seen by pulmonary critical care and diagnosed with lung mass suggested of possible lung cancer along with COPD exacerbation causing acute on chronic respiratory failure.    He is being started on oxygen, he is continuing his home nebulizer treatments on scheduled and on as needed basis, pulmonary critical care is seen the patient and they have scheduled outpatient PET scan along with a bronchoscopy for biopsy of his lung mass, they have cleared him to be discharged on a steroid taper along with oral Levaquin for a few more days. Patient himself feels a whole lot better. He was seen here by Child psychotherapist and case management to assist him with his Medicaid paperwork and for any needed financial assistance. Patient at baseline has cold stage II COPD. He also has history of very dissection in 2011.    History of CAD with PCI and stent placement in 2012, he is on Plavix, per pulmonary his Plavix will be held on 02/21/2013 prior to his scheduled bronchoscopy and biopsy, pulmonary has discussed and clearance for this from his primary cardiology team. He is stable from his cardiac issue standpoint this admission. He will continue aspirin, beta blocker and statin for secondary prevention.      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 Is stable from this standpoint no acute issues.     Diabetes mellitus type 2 stable. Oral home medications will be resumed. Will defer long-term management of diabetes to PCP.  Lab Results   Component Value Date   HGBA1C 7.5* 02/15/2013      Today   Subjective:   Blanche [place] today has no headache, no chest abdominal pain, no new weakness tingling or numbness, feels much better wants to go home today.  SOB much better.  Objective:   Blood pressure 132/84, pulse 86, temperature 97.9 F (36.6  C), temperature source Oral, resp. rate 24, height 5\' 10"  (1.778 m), weight 104.9 kg (231 lb 4.2 oz), SpO2 100.00%.   Intake/Output Summary (Last 24 hours) at 02/16/13 1114 Last data filed at 02/16/13 0715  Gross per 24 hour  Intake    240 ml  Output    975 ml  Net   -735 ml    Exam Awake Alert, Oriented *3, No new F.N deficits, Normal affect [place].AT,PERRAL Supple Neck,No JVD, No cervical lymphadenopathy appriciated.  Symmetrical Chest wall movement, Good air movement bilaterally, bilateral wheezing minimal RRR,No Gallops,Rubs or new Murmurs, No Parasternal Heave +ve B.Sounds, Abd Soft, Non tender, No organomegaly appriciated, No rebound -guarding or rigidity. No Cyanosis, Clubbing or edema, No new Rash or bruise  Data Review   CBC w Diff: Lab Results  Component Value Date   WBC 11.3* 02/16/2013   HGB 13.6 02/16/2013   HCT 37.6* 02/16/2013   PLT 193 02/16/2013   LYMPHOPCT 18.4 04/19/2012   MONOPCT 7.6 04/19/2012   EOSPCT 3.1 04/19/2012   BASOPCT 0.4 04/19/2012    CMP: Lab Results  Component Value Date   NA 133* 02/16/2013   K 4.5 02/16/2013   CL 97 02/16/2013   CO2 24 02/16/2013   BUN 30* 02/16/2013   CREATININE 1.49* 02/16/2013   CREATININE 1.46* 12/10/2010   PROT 6.9 02/16/2013   ALBUMIN 3.4* 02/16/2013   BILITOT 0.3 02/16/2013   ALKPHOS 86 02/16/2013   AST 13 02/16/2013   ALT 18 02/16/2013  .   Total Time in preparing paper work, data evaluation and todays exam - 35 minutes  Leroy Sea M.D on 02/16/2013 at 11:14 AM  [facility] Office  [phone]

## 2013-02-16 NOTE — Progress Notes (Signed)
[name] to be D/C'd Home per MD order.  Discussed with the patient and all questions fully answered.    Medication List         amLODipine 2.5 MG tablet  Commonly known as:  NORVASC  Take 2.5 mg by mouth daily.     aspirin EC 81 MG tablet  Take 81 mg by mouth daily.     atorvastatin 40 MG tablet  Commonly known as:  LIPITOR  Take 40 mg by mouth at bedtime.     bisoprolol 5 MG tablet  Commonly known as:  ZEBETA  Take 5 mg by mouth daily.     clopidogrel 75 MG tablet  Commonly known as:  PLAVIX  Take 1 tablet (75 mg total) by mouth daily. Resume once cleared by the lung doctor after bronchoscopy and lung biopsy  Start taking on:  02/21/2013     DSS 100 MG Caps  Take 100 mg by mouth 2 (two) times daily.     Fluticasone Furoate-Vilanterol 100-25 MCG/INH Aepb  Commonly known as:  BREO ELLIPTA  Inhale 1 puff into the lungs 2 (two) times daily.     glipiZIDE 10 MG tablet  Commonly known as:  GLUCOTROL  Take 10 mg by mouth 2 (two) times daily before a meal.     isosorbide mononitrate 60 MG 24 hr tablet  Commonly known as:  IMDUR  Take 1 tablet (60 mg total) by mouth daily.     levofloxacin 750 MG tablet  Commonly known as:  LEVAQUIN  Take 1 tablet (750 mg total) by mouth daily.     losartan 50 MG tablet  Commonly known as:  COZAAR  Take 1 tablet (50 mg total) by mouth daily.     metFORMIN 750 MG 24 hr tablet  Commonly known as:  GLUCOPHAGE-XR  Take 1,500 mg by mouth daily with breakfast.     nitroGLYCERIN 0.4 MG SL tablet  Commonly known as:  NITROSTAT  Place 0.4 mg under the tongue every 5 (five) minutes as needed for chest pain. May repeat up to 3 doses     predniSONE 5 MG tablet  Commonly known as:  DELTASONE  Label  & dispense according to the schedule below. 10 Pills PO for 3 days then, 8 Pills PO for 3 days, 6 Pills PO for 3 days, 4 Pills PO for 3 days, 2 Pills PO for 3 days, 1 Pills PO for 3 days, 1/2 Pill  PO for 3 days then STOP. Total 95 pills.     albuterol (2.5 MG/3ML) 0.083% nebulizer solution  Commonly known as:  PROVENTIL  Take 3 mLs (2.5 mg total) by nebulization 5 (five) times daily as needed for wheezing or shortness of breath.     PROAIR HFA 108 (90 BASE) MCG/ACT inhaler  Generic drug:  albuterol  Inhale 2 puffs into the lungs every 6 (six) hours as needed for wheezing or shortness of breath.        VVS, Skin clean, dry and intact without evidence of skin break down, no evidence of skin tears noted. IV catheter discontinued intact. Site without signs and symptoms of complications. Dressing and pressure applied.  An After Visit Summary was printed and given to the patient. Patient escorted via WC, and D/C home via private auto.  Kennyth Arnold D 02/16/2013 11:59 AM

## 2013-02-17 NOTE — Care Management Note (Signed)
    Page 1 of 2   02/17/2013     5:04:41 PM   CARE MANAGEMENT NOTE 02/17/2013  Patient:  [name], [name]   Account Number:  [id number]  Date Initiated:  02/17/2013  Documentation initiated by:  Letha Cape  Subjective/Objective Assessment:   dx dm, lung mass, sob  admit     Action/Plan:   Anticipated DC Date:  02/16/2013   Anticipated DC Plan:  HOME W HOME HEALTH SERVICES      DC Planning Services  CM consult      [facility] Choice  HOME HEALTH   Choice offered to / List presented to:  C-1 Patient   DME arranged  [facility]      DME agency  [facility]     [facility] arranged  HH-1 RN  HH-2 PT  [facility] - 11 Patient Refused      Status of service:  Completed, signed off Medicare Important Message given?   (If response is "NO", the following Medicare IM given date fields will be blank) Date Medicare IM given:   Date Additional Medicare IM given:    Discharge Disposition:  HOME W HOME HEALTH SERVICES  Per UR Regulation:  Reviewed for med. necessity/level of care/duration of stay  If discussed at Long Length of Stay Meetings, dates discussed:    Comments:  02/17/13 17:02 Letha Cape RN, BSN [phone] patient dc to home, patient dc'd with oxygen with [facility], called patient to set up hh services he stated he does not need hh services so he did not want this set up.

## 2013-02-21 ENCOUNTER — Encounter (HOSPITAL_COMMUNITY): Payer: Self-pay | Admitting: *Deleted

## 2013-02-21 LAB — CULTURE, BLOOD (ROUTINE X 2): Culture: NO GROWTH

## 2013-02-22 ENCOUNTER — Telehealth: Payer: Self-pay | Admitting: Internal Medicine

## 2013-02-22 NOTE — Telephone Encounter (Signed)
Triage  plese call patient on 02/23/13 early and tell him NOT to take his plavix fro 02/23/13 for biopsy. Last week I told [facility] that at dc he should not take plavix from 02/21/13. Instead they wrote him to START taking it 02/21/13. This is wrong. Please have him stop 02/23/13 onwards for biipsy/ He should stop aspirin 02/25/13 onwards  Thanks  MR

## 2013-02-23 ENCOUNTER — Telehealth: Payer: Self-pay | Admitting: Cardiology

## 2013-02-23 ENCOUNTER — Telehealth: Payer: Self-pay | Admitting: Internal Medicine

## 2013-02-23 NOTE — Telephone Encounter (Signed)
lmomtcb x1 for pt 

## 2013-02-23 NOTE — Telephone Encounter (Signed)
I spoke with the pt and advised. He has not taken the plavix. Carron Curie, CMA

## 2013-02-23 NOTE — Telephone Encounter (Signed)
Spoke w/pt.  States he is having a PET scan next week and has been given 4 different instructions from preadmission. Wants to know who to call to get correct instructions.  Advised to call [facility] and speak w/radiology direct.  Was d/c from hospital this week and was told to make an app w/Dr. Shirlee Latch and PCP.  Has seen his PCP but wants to know if should be seen by Dr. Shirlee Latch.  Is not having any cardiac problems. Advised to call back after has bx next week to schedule app w/Dr. Shirlee Latch.

## 2013-02-23 NOTE — Telephone Encounter (Signed)
New Problem:  Pt states he needs his instructions for his CT faxed to his primary care doctor.. Pt states he does not have a fax number for them. Pt states he only has a number [phone].Marland Kitchen Pt would like [place] to call his doc and then  Fax instructions.   Pt states he needs an appt this Friday with Dr Shirlee Latch.. I looked and doctor McLean/PA/NP have nothing available. Please advise.

## 2013-02-24 ENCOUNTER — Other Ambulatory Visit: Payer: Self-pay | Admitting: Internal Medicine

## 2013-02-24 ENCOUNTER — Encounter (HOSPITAL_COMMUNITY)
Admit: 2013-02-24 | Discharge: 2013-02-24 | Disposition: A | Discharge status: Home / Self Care | Payer: BC Managed Care – PPO | Attending: Internal Medicine | Admitting: Internal Medicine

## 2013-02-24 ENCOUNTER — Encounter (HOSPITAL_COMMUNITY): Payer: Self-pay

## 2013-02-24 DIAGNOSIS — C3490 Malignant neoplasm of unspecified part of unspecified bronchus or lung: Secondary | ICD-10-CM | POA: Insufficient documentation

## 2013-02-24 DIAGNOSIS — J438 Other emphysema: Secondary | ICD-10-CM | POA: Insufficient documentation

## 2013-02-24 DIAGNOSIS — R918 Other nonspecific abnormal finding of lung field: Secondary | ICD-10-CM

## 2013-02-24 DIAGNOSIS — C771 Secondary and unspecified malignant neoplasm of intrathoracic lymph nodes: Secondary | ICD-10-CM | POA: Insufficient documentation

## 2013-02-24 DIAGNOSIS — R222 Localized swelling, mass and lump, trunk: Secondary | ICD-10-CM | POA: Insufficient documentation

## 2013-02-24 LAB — GLUCOSE, CAPILLARY: Glucose-Capillary: 68 mg/dL — ABNORMAL LOW (ref 70–99)

## 2013-02-24 MED ORDER — FLUDEOXYGLUCOSE F - 18 (FDG) INJECTION
19.2000 | Freq: Once | INTRAVENOUS | Status: AC | PRN
  Administered 2013-02-24: 19.2 via INTRAVENOUS

## 2013-02-25 ENCOUNTER — Telehealth: Payer: Self-pay | Admitting: Internal Medicine

## 2013-02-25 NOTE — Telephone Encounter (Signed)
[name] looks like the pt is set for an EBUS on 10/20. [facility] is calling about authorization. Please advise. Carron Curie, CMA

## 2013-02-25 NOTE — Progress Notes (Signed)
10--17-14 1445 Reaffirmed with pt. Instructions on pre-procedure meds/ and arrival time for planned procedure 02-28-13.,given previously by B.Nanny,RN(02-21-13)W. Carely Nappier,rn

## 2013-02-25 NOTE — Telephone Encounter (Signed)
Pt had PEt scan nothing further needed. Carron Curie, CMA

## 2013-02-25 NOTE — Telephone Encounter (Signed)
I spoke with MR, he is out of town, and he reviewed the scan and advised to tell the pt the following: PET is abnormal and consistent with cancer, patient already knows likely cancer. And to make sure he is off plavix for ebus on Monday.   I spoke with the pt and advised. He is not taking plavix. Carron Curie, CMA

## 2013-02-25 NOTE — Telephone Encounter (Signed)
Please advise. Jennifer Castillo, CMA   

## 2013-02-25 NOTE — Telephone Encounter (Signed)
Left message with [email or web address]  no precert required Tobe Sos

## 2013-02-28 ENCOUNTER — Ambulatory Visit (HOSPITAL_COMMUNITY)
Admission: RE | Admit: 2013-02-28 | Discharge: 2013-02-28 | Disposition: A | Discharge status: Home / Self Care | Payer: BC Managed Care – PPO | Source: Ambulatory Visit | Attending: Internal Medicine | Admitting: Internal Medicine

## 2013-02-28 ENCOUNTER — Encounter (HOSPITAL_COMMUNITY)
Admission: RE | Disposition: A | Discharge status: Home / Self Care | Payer: Self-pay | Source: Ambulatory Visit | Attending: Internal Medicine

## 2013-02-28 ENCOUNTER — Encounter (HOSPITAL_COMMUNITY): Payer: Self-pay | Admitting: *Deleted

## 2013-02-28 ENCOUNTER — Encounter (HOSPITAL_COMMUNITY): Payer: BC Managed Care – PPO | Admitting: Anesthesiology

## 2013-02-28 ENCOUNTER — Ambulatory Visit (HOSPITAL_COMMUNITY): Payer: BC Managed Care – PPO | Admitting: Anesthesiology

## 2013-02-28 DIAGNOSIS — E669 Obesity, unspecified: Secondary | ICD-10-CM | POA: Insufficient documentation

## 2013-02-28 DIAGNOSIS — Z8673 Personal history of transient ischemic attack (TIA), and cerebral infarction without residual deficits: Secondary | ICD-10-CM | POA: Insufficient documentation

## 2013-02-28 DIAGNOSIS — I252 Old myocardial infarction: Secondary | ICD-10-CM | POA: Insufficient documentation

## 2013-02-28 DIAGNOSIS — E785 Hyperlipidemia, unspecified: Secondary | ICD-10-CM | POA: Insufficient documentation

## 2013-02-28 DIAGNOSIS — J4489 Other specified chronic obstructive pulmonary disease: Secondary | ICD-10-CM | POA: Insufficient documentation

## 2013-02-28 DIAGNOSIS — C3400 Malignant neoplasm of unspecified main bronchus: Secondary | ICD-10-CM | POA: Insufficient documentation

## 2013-02-28 DIAGNOSIS — R222 Localized swelling, mass and lump, trunk: Secondary | ICD-10-CM

## 2013-02-28 DIAGNOSIS — I2510 Atherosclerotic heart disease of native coronary artery without angina pectoris: Secondary | ICD-10-CM | POA: Insufficient documentation

## 2013-02-28 DIAGNOSIS — N189 Chronic kidney disease, unspecified: Secondary | ICD-10-CM | POA: Insufficient documentation

## 2013-02-28 DIAGNOSIS — R599 Enlarged lymph nodes, unspecified: Secondary | ICD-10-CM | POA: Insufficient documentation

## 2013-02-28 DIAGNOSIS — J449 Chronic obstructive pulmonary disease, unspecified: Secondary | ICD-10-CM | POA: Insufficient documentation

## 2013-02-28 DIAGNOSIS — R918 Other nonspecific abnormal finding of lung field: Secondary | ICD-10-CM

## 2013-02-28 DIAGNOSIS — E119 Type 2 diabetes mellitus without complications: Secondary | ICD-10-CM | POA: Insufficient documentation

## 2013-02-28 DIAGNOSIS — Z79899 Other long term (current) drug therapy: Secondary | ICD-10-CM | POA: Insufficient documentation

## 2013-02-28 HISTORY — DX: Unspecified asthma, uncomplicated: J45.909

## 2013-02-28 HISTORY — DX: Essential (primary) hypertension: I10

## 2013-02-28 HISTORY — PX: ENDOBRONCHIAL ULTRASOUND: SHX5096

## 2013-02-28 HISTORY — DX: Pneumonia, unspecified organism: J18.9

## 2013-02-28 SURGERY — ENDOBRONCHIAL ULTRASOUND (EBUS)
Anesthesia: General | Laterality: Bilateral | Wound class: Clean Contaminated

## 2013-02-28 MED ORDER — REMIFENTANIL HCL 1 MG IV SOLR
INTRAVENOUS | Status: DC | PRN
  Administered 2013-02-28: .025 ug/kg/min via INTRAVENOUS

## 2013-02-28 MED ORDER — MIDAZOLAM HCL 5 MG/5ML IJ SOLN
INTRAMUSCULAR | Status: DC | PRN
  Administered 2013-02-28: 2 mg via INTRAVENOUS

## 2013-02-28 MED ORDER — LEVALBUTEROL HCL 0.63 MG/3ML IN NEBU
0.6300 mg | INHALATION_SOLUTION | Freq: Once | RESPIRATORY_TRACT | Status: AC
  Administered 2013-02-28: 0.63 mg via RESPIRATORY_TRACT
  Filled 2013-02-28: qty 3

## 2013-02-28 MED ORDER — LACTATED RINGERS IV SOLN
INTRAVENOUS | Status: DC
  Administered 2013-02-28: 07:00:00 via INTRAVENOUS
  Administered 2013-02-28: 1000 mL via INTRAVENOUS

## 2013-02-28 MED ORDER — PROPOFOL 10 MG/ML IV BOLUS
INTRAVENOUS | Status: DC | PRN
  Administered 2013-02-28: 200 mg via INTRAVENOUS

## 2013-02-28 MED ORDER — SUCCINYLCHOLINE CHLORIDE 20 MG/ML IJ SOLN
INTRAMUSCULAR | Status: DC | PRN
  Administered 2013-02-28: 100 mg via INTRAVENOUS

## 2013-02-28 MED ORDER — ONDANSETRON HCL 4 MG/2ML IJ SOLN
INTRAMUSCULAR | Status: DC | PRN
  Administered 2013-02-28: 4 mg via INTRAMUSCULAR

## 2013-02-28 MED ORDER — LIDOCAINE HCL (CARDIAC) 20 MG/ML IV SOLN
INTRAVENOUS | Status: DC | PRN
  Administered 2013-02-28: 100 mg via INTRAVENOUS

## 2013-02-28 MED ORDER — EPHEDRINE SULFATE 50 MG/ML IJ SOLN
INTRAMUSCULAR | Status: DC | PRN
  Administered 2013-02-28: 5 mg via INTRAVENOUS

## 2013-02-28 NOTE — Interval H&P Note (Signed)
History and Physical Interval Note:  02/28/2013 7:22 AM  [name]  has presented today for surgery, with the diagnosis of Lung Mass  The various methods of treatment have been discussed with the patient and family. After consideration of risks, benefits and other options for treatment, the patient has consented to  Procedure(s): ENDOBRONCHIAL ULTRASOUND (Bilateral) as a surgical intervention .  The patient's history has been reviewed, patient examined, no change in status, stable for surgery.  I have reviewed the patient's chart and labs.  Questions were answered to the patient's satisfaction.     Risks of pneumothorax, hemothorax, sedation/anesthesia complications such as cardiac or respiratory arrest or hypotension, stroke and bleeding all explained. Benefits of diagnosis but limitations of non-diagnosis also explained. Patient verbalized understanding and wished to proceed.   Dr. Kalman Shan, M.D., [facility].C.P Pulmonary and Critical Care Medicine Staff Physician [facility] [place] [facility] Pager: [phone], If no answer or between  15:00h - 7:00h: call [phone]  02/28/2013 7:23 AM

## 2013-02-28 NOTE — Anesthesia Postprocedure Evaluation (Signed)
  Anesthesia Post-op Note  Patient: [name]  Procedure(s) Performed: Procedure(s) (LRB): ENDOBRONCHIAL ULTRASOUND (Bilateral)  Patient Location: PACU  Anesthesia Type: General  Level of Consciousness: awake and alert   Airway and Oxygen Therapy: Patient Spontanous Breathing  Post-op Pain: mild  Post-op Assessment: Post-op Vital signs reviewed, Patient's Cardiovascular Status Stable, Respiratory Function Stable, Patent Airway and No signs of Nausea or vomiting  Last Vitals:  Filed Vitals:   02/28/13 0834  BP: 156/93  Pulse: 89  Temp: 36.8 C  Resp: 19    Post-op Vital Signs: stable   Complications: No apparent anesthesia complications

## 2013-02-28 NOTE — H&P (View-Only) (Signed)
PULMONARY  / CRITICAL CARE MEDICINE  Name: [name] MRN: [id number] DOB: 07/16/1955    ADMISSION DATE:  02/14/2013 CONSULTATION DATE:  02/15/13  REFERRING MD :  [place] PRIMARY SERVICE:  [facility]  CHIEF COMPLAINT: Worsening SOB with CT confirmation of Left Infrahilar Mass  BRIEF PATIENT DESCRIPTION:  57 y/o M admitted on 10/6 with worsening DOE in setting of Gold II COPD.  Work up demonstrated L infrahilar mass concerning for carcinoma with possible mets. PCCM consulted for further eval.    SIGNIFICANT EVENTS / STUDIES:  02/25/13: CT Chest : Left infrahilar mass lesion with probable metastatic left hilar and mediastinal adenopathy and mild postobstructive changes distally in the anterobasal segment of the left lower lung. Findings are highly concerning for primary bronchogenic carcinoma such as small cell lung cancer. Severe centrilobular pulmonary emphysema.  CULTURES: 10/07 Blood x 2>>>  ANTIBIOTICS: 10/07 Levaquin>>>   HISTORY OF PRESENT ILLNESS: 57 y/o M, with PMH of Gold II COPD with asthmatic component, admitted 10/6 with a 3 week history of SOB and productive cough with clear sputum. The nebs he uses at home have not been effective and he became so dyspneic he scheduled an appointment with Dr. [name] emergently on 10/01. He was given a neb  treatment in the office that made him feel better, but was SOB again the next day. He got significantly worse 10/07 and presented to the ED. CT scan in the ED revealed a Left infrahilar mass lesion with probable metastatic left hilar and mediastinal adenopathy and mild postobstructive changes  distally in the anterobasal segment of the left lower lung. CCM has been asked to consult for further work-up and treatment  of this mass.   Patient treated with IV abx, solu-medrol and nebulized steroids with subjective improvement in symptoms.  He has had difficulty obtaining medications as outpatient.  He relays that due to cost, he only uses regimen  when absolutely necessary.  Continues to work and reports he has "terrible insurance".  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    Currently denies fevers, n/v/d, chest pain.  Reports improvement in DOE but continues to be SOB with activity.   PAST MEDICAL HISTORY :  Past Medical History  Diagnosis Date  . CAD (coronary artery disease)     a. PCI 3/08 to OM2 and mid CFX; b.  NSTEMI 6/10:  Prior stents patent, 90% dCFX,  mRCA occl (old) with collats; EF 55% on LV-gram => Xience DES 2.5 x 23 mm to distal CFX;  c. abnl MV 12/13 => LHC 04/23/12:  pLAD 50%, oOM1 80%, long stented segment of the CFX extending into the PLOM with the PLOM totally occluded ostially, CFX 70% ISR, mCFX 80% ISR, pRCA chronically occluded. Med Rx planned  . Diabetes mellitus type II   . Hyperlipidemia   . COPD (chronic obstructive pulmonary disease)     Quit tobacco 09/2009; Gold Stage 2 with asthma component - FEV1 1.53 L/54%, 14% fev1 BD response, DLCO 54% - July 2011; MM genotype 01/07/10; unable to afford spiriva and unwilling to try ics due to prior renal failure: state to Dr. Tiney Zipper, Aug 2011; started on atrovent HFA fall 2011. no desturation on walk test Dec 2011  . CKD (chronic kidney disease)   . Obesity   . History of CVA (cerebrovascular accident)     Without residual defecits  . 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 no further rx  . CHF (congestive heart failure)    Past Surgical History  Procedure Laterality Date  . Wedge resection  03/28/2010  . Coronary stent placement     Prior to Admission medications   Medication Sig Start Date End Date Taking? Authorizing Provider  albuterol (PROAIR HFA) 108 (90 BASE)  MCG/ACT inhaler Inhale 2 puffs into the lungs every 6 (six) hours as needed for wheezing or shortness of breath.   Yes Historical Provider, MD  albuterol (PROVENTIL) (2.5 MG/3ML) 0.083% nebulizer solution Take 2.5 mg by nebulization 5 (five) times daily as needed for wheezing or shortness of breath.   Yes Historical Provider, MD  amLODipine (NORVASC) 2.5 MG tablet Take 2.5 mg by mouth daily.   Yes Historical Provider, MD  aspirin EC 81 MG tablet Take 81 mg by mouth daily.   Yes Historical Provider, MD  atorvastatin (LIPITOR) 40 MG tablet Take 40 mg by mouth at bedtime.   Yes Historical Provider, MD  bisoprolol (ZEBETA) 5 MG tablet Take 5 mg by mouth daily.   Yes Historical Provider, MD  clopidogrel (PLAVIX) 75 MG tablet Take 1 tablet (75 mg total) by mouth daily. 12/23/12  Yes Dalton S McLean, MD  Fluticasone Furoate-Vilanterol (BREO ELLIPTA) 100-25 MCG/INH AEPB Inhale 1 puff into the lungs 2 (two) times daily.   Yes Historical Provider, MD  glipiZIDE (GLUCOTROL) 10 MG tablet Take 10 mg by mouth 2 (two) times daily before a meal.    Yes Historical Provider, MD  isosorbide mononitrate (IMDUR) 60 MG 24 hr tablet Take 1 tablet (60 mg total) by mouth daily. 08/10/12  Yes Dalton S McLean, MD  losartan (COZAAR) 50 MG tablet Take 1 tablet (50 mg total) by mouth daily. 08/10/12  Yes Dalton S McLean, MD  metFORMIN (GLUCOPHAGE-XR) 750 MG 24 hr tablet Take 1,500 mg by mouth daily with breakfast.   Yes Historical Provider, MD  nitroGLYCERIN (NITROSTAT) 0.4 MG SL tablet Place 0.4 mg under the tongue every 5 (five) minutes as needed for chest pain. May repeat up to 3 doses   Yes Historical Provider, MD   Allergies  Allergen Reactions  . Erythromycin Other (See Comments)    Reaction while in hospital - pt doesn't know reaction  . Penicillins Other (See Comments)    Childhood reaction - unknown    FAMILY HISTORY:  Family History  Problem Relation Age of Onset  . Stroke Other   . Diabetes Mother   . Cancer  Maternal Uncle    SOCIAL HISTORY:  reports that he quit smoking about 3 years ago. He does not have any smokeless tobacco history on file. He reports that he does not drink alcohol or use illicit drugs.  REVIEW OF SYSTEMS:   Constitutional: Negative for fever, chills, weight loss, malaise/fatigue and diaphoresis only intermittently with exertion-.  HENT: Negative for hearing loss, ear pain, nosebleeds, congestion, sore throat, neck pain, tinnitus and ear discharge.   Eyes: Negative for blurred vision, double vision, photophobia , discharge and redness. Positive for Headache behind the left eye after combining meds for SOB.( Brio/Albuterol/ProAir.) Respiratory: Positive for cough,Negative for hemoptysis,+ sputum production ( Clear),+ shortness of breath,+ wheezing( but states better right now) and Negative stridor.   Cardiovascular: Negative for chest pain, palpitations, orthopnea, claudication, leg swelling and PND.    Gastrointestinal: Negative for heartburn, + for nausea,+for dry heaving but no vomiting, neg abdominal pain, diarrhea, constipation, blood in stool and melena.  Genitourinary: Negative for dysuria, urgency, frequency, hematuria and flank pain.  Musculoskeletal: Negative for myalgias, back pain, joint pain and falls.  Skin: Negative for itching and rash.  Neurological: Negative for dizziness, tingling, tremors, sensory change, speech change, focal weakness, seizures, loss of consciousness, weakness and headaches.  Endo/Heme/Allergies: Negative for environmental allergies and polydipsia. Does not bruise/bleed easily.  SUBJECTIVE: "Feeling better since they gave me the steroids in the Emergency Room".  VITAL SIGNS: Temp:  [97.9 F (36.6 C)-98.7 F (37.1 C)] 98.5 F (36.9 C) (10/07 0551) Pulse Rate:  [81-104] 91 (10/07 0551) Resp:  [18-24] 18 (10/07 0551) BP: (107-134)/(63-80) 131/80 mmHg (10/07 0551) SpO2:  [92 %-99 %] 99 % (10/07 0836) Weight:  [220 lb (99.791 kg)-231 lb 4.2  oz (104.9 kg)] 231 lb 4.2 oz (104.9 kg) (10/07 0205)  PHYSICAL EXAMINATION: General:  Alert and appropriate, conversational, in NAD. Neuro:   Grossly intact,moving all extremities equally.  HEENT:  MM pink and moist, atraumatic, normocephalic,adequate dentition Neck:  Supple,no lymphadenopathy. Cardiovascular:  S1, S2, No rubs or murmurs, RRR Lungs: Clear with some scattered rhonchi which clears with cough. Diminished in the bases, L> R.No wheezing noted while at rest. Abdomen:  Obese, BS present, non-tender, non-distended. Musculoskeletal:  No cyanosis, edema, good strength. Skin: normal skin turgor, dry and intact with no rash.   Recent Labs Lab 02/14/13 1953 02/14/13 2112  NA 142 140  K 4.5 4.0  CL 105 103  CO2 26  --   BUN 22 23  CREATININE 1.53* 1.50*  GLUCOSE 99 96    Recent Labs Lab 02/14/13 1953 02/14/13 2112  HGB 14.1 14.6  HCT 40.9 43.0  WBC 8.7  --   PLT 222  --    Dg Chest 2 View  02/14/2013   *RADIOLOGY REPORT*  Clinical Data: Initial encounter for 3-week history of shortness of breath, productive cough, and chest pain related to the cough. Former smoker who quit approximately 5 years ago.  Current history of COPD.  Prior history of wedge resection of a right upper lobe mass in September, 2011, pathology revealing necrotizing granulomas.  CHEST - 2 VIEW  Comparison: Two-view chest x-ray 06/12/2010, 05/01/2010, 02/05/2010.  PET CT 01/17/2010.  Findings: Interval development of a mass in the central left upper lobe in the vicinity of the left hilum.  The mass approximates 7.4 x 4.6 cm on the PA image, but is difficult to measure on the lateral image.  Cardiac silhouette normal in size, unchanged. Hilar and mediastinal contours otherwise unremarkable.  Minimal streaky airspace opacity in the left upper lobe.  Postsurgical changes in the right upper lobe with pleuroparenchymal scarring at the right base, unchanged.  No new abnormalities in the right lung. No pleural  effusions.  Degenerative changes involving the thoracic spine.  IMPRESSION: Large mass involving the central left upper lobe, worrisome for a bronchogenic carcinoma.  Mild post-obstructive atelectasis and/or pneumonitis in the left upper lobe.   Original Report Authenticated By: Thomas Lawrence, M.D.   Ct Chest W Contrast  02/14/2013   *RADIOLOGY REPORT*  Clinical Data: Dyspnea, mass seen on chest x-ray  CT CHEST WITH CONTRAST  Technique:  Multidetector CT imaging of the chest was performed following the standard protocol during bolus administration of intravenous contrast.  Contrast: 75mL OMNIPAQUE IOHEXOL 300 MG/ML SOLN  Comparison: Chest x-ray earlier today at 2028 hours; prior   chest CT 01/03/2010; prior PET CT 01/17/2010  Findings:  Mediastinum: Bulky left hilar and mediastinal adenopathy.  An index prevascular lymph node measures 3.2 x 1.7 cm.  A left hilar nodal mass adjacent to the main pulmonary artery measures 3.5 x 2.6 cm. Left hilar nodal conglomerate posterior to the left main pulmonary artery measures 4.2 x 2.3 cm.  Left infrahilar mass like no conglomeration measures 5.5 x 4.1 cm.  There is associated mass effect on the left main pulmonary artery and the lower lobe branches.  No definite supraclavicular or right hilar adenopathy. Nonspecific subcarinal node measures 12 mm in short axis.  Para esophageal nodes have been present dating back to 2011 and likely benign.  Heart/Vascular: Conventional three-vessel arch anatomy.  The heart is within normal limits for size.  Atherosclerotic calcifications noted throughout the coronary arteries.  No pericardial effusion. No large central PE.  Lungs/Pleura:  The advanced centrilobular emphysema.  Cough, bronchovascular distribution of tree in bud micronodularity extending in the anterobasal segment the left lower lobe inferior to the left infrahilar mass lesion.  Findings are most consistent with postobstructive changes.  Surgical changes in the right upper  lung consistent with prior wedge resection.  Upper Abdomen: 4 mm hypoattenuating focus in hepatic segment to just inferior to the diaphragm is too small to accurately characterize.  Otherwise, no focal hepatic lesion or upper abdominal evidence of metastatic disease.  Mild fatty atrophy of the pancreas.  Scattered atherosclerotic vascular calcifications.  Bones: No acute fracture or aggressive appearing lytic or blastic osseous lesion.  Multiple remote healed right-sided rib fractures.  IMPRESSION:  1.  Left infrahilar mass lesion with probable metastatic left hilar and mediastinal adenopathy and mild postobstructive changes distally in the anterobasal segment of the left lower lung. Findings are highly concerning for primary bronchogenic carcinoma such as small cell lung cancer. Recommend further evaluation with outpatient PET CT followed by endobronchial biopsy.  If endobronchial biopsy is nondiagnostic, CT guided biopsy could be considered.  2.  Severe centrilobular pulmonary emphysema.  3.  Atherosclerosis including coronary artery disease  4.  Small 4 mm low attenuation lesion in the liver is nonspecific and warrants attention on follow-up imaging.   Original Report Authenticated By: Heath McCullough, M.D.    ASSESSMENT / PLAN:  A:  Gold Stage 2 COPD with new findings of Left intra hilar lung mass per CT with concern for post obstructive process likely.  P: - will have office schedule PET Scan prior to 02/28/13 EBUS date -Schedule EBUS to obtain tissue sample for diagnosis and, if necessary, staging of malignancy  02/28/13 7.30am -Hold  Plavix (stent / CAD) for pending procedure if it is 02/21/13. If EBUS is 02/28/13 will dc plavix on 02/21/13 (d/w cards) -Adjust nebs to Q6h albuterol/atrovent (no bronchospasm on exam)  + pulmicort neb bid -Wean O2 to 1.5 Liters in patient with COPD and sats of 98% on 4L.  Minimal oxygen required for sats 90-95% -Continue Levaquin as ordered. -Prednisone 40 QD-  taper off over 2 weeks -Social Work/ Case Manager consult for issues with cost of medications.  Scribed by Sarah Groce, RN ACNP Student [employer] for Brandi Ollis, NP-C.  Brandi Ollis, NP-C [facility] Pgr: [phone] or [phone]   02/15/2013, 9:54 AM   STAFF NOTE: I, Dr M Tyrianna Lightle have personally reviewed patient's available data, including medical history, events of note, physical examination and test results as part of my evaluation. I have discussed with resident/NP and other care   providers such as pharmacist, RN and RRT.  In addition,  I personally evaluated patient and elicited key findings of new hilar mass. Very suspicious for small cell lung cancer. He is now disabled due to copd, cad,obesity. Will get social work to eval for medicaid. Will set up EBUS after PET scan ideally. Will have my office work on PET scan date. Based on that EBUS 02/28/13 scheduled provisionally at 7.30am. Will work on getting PET before that. REstart  plavix for now but dc on 02/21/13 for  EBUS. He is agreeable wit plan.  Rest per NP/medical resident whose note is outlined above and that I agree with   .  Dr. Arthor Gorter, M.D., F.C.C.P Pulmonary and Critical Care Medicine Staff Physician [facility] Pager: [phone], If no answer or between  15:00h - 7:00h: call [phone]  02/15/2013 12:03 PM       

## 2013-02-28 NOTE — Transfer of Care (Signed)
Immediate Anesthesia Transfer of Care Note  Patient: [name]  Procedure(s) Performed: Procedure(s): ENDOBRONCHIAL ULTRASOUND (Bilateral)  Patient Location: PACU  Anesthesia Type:General  Level of Consciousness: sedated  Airway & Oxygen Therapy: Patient Spontanous Breathing and Patient connected to face mask oxygen  Post-op Assessment: Report given to PACU RN and Post -op Vital signs reviewed and stable  Post vital signs: Reviewed and stable  Complications: No apparent anesthesia complications

## 2013-02-28 NOTE — Anesthesia Preprocedure Evaluation (Signed)
Anesthesia Evaluation  Patient identified by MRN, date of birth, ID band Patient awake    Reviewed: Allergy & Precautions, H&P , NPO status , Patient's Chart, lab work & pertinent test results  Airway Mallampati: III TM Distance: >3 FB Neck ROM: Full    Dental no notable dental hx. (+) Teeth Intact   Pulmonary asthma , COPDformer smoker,    Pulmonary exam normal + wheezing      Cardiovascular hypertension, Pt. on medications + CAD, + Past MI, + Cardiac Stents and +CHF Rhythm:Regular Rate:Normal     Neuro/Psych CVA, No Residual Symptoms negative neurological ROS  negative psych ROS   GI/Hepatic negative GI ROS, Neg liver ROS,   Endo/Other  diabetes, Type 2, Oral Hypoglycemic Agents  Renal/GU Renal InsufficiencyRenal disease  negative genitourinary   Musculoskeletal negative musculoskeletal ROS (+)   Abdominal   Peds negative pediatric ROS (+)  Hematology negative hematology ROS (+)   Anesthesia Other Findings   Reproductive/Obstetrics negative OB ROS                           Anesthesia Physical Anesthesia Plan  ASA: III  Anesthesia Plan: General   Post-op Pain Management:    Induction: Intravenous  Airway Management Planned: Oral ETT  Additional Equipment:   Intra-op Plan:   Post-operative Plan: Extubation in OR  Informed Consent: I have reviewed the patients History and Physical, chart, labs and discussed the procedure including the risks, benefits and alternatives for the proposed anesthesia with the patient or authorized representative who has indicated his/her understanding and acceptance.   Dental advisory given  Plan Discussed with: CRNA  Anesthesia Plan Comments: (Pt wheezing and sob but states this is baseline for him. "this is as good as I get")        Anesthesia Quick Evaluation

## 2013-02-28 NOTE — Op Note (Signed)
Name:  [name] MRN:  [id number] DOB:  1956/01/18  PROCEDURE NOTE  Procedure(s): Flexible bronchoscopy [phone]) Transbronchial needle aspiration (10932) of the 10L LUNG MASS   Indications:  Hilar / mediastinal lymphadenopathy and LEFT LUNG MASS  Consent:  Procedure, benefits, risks and alternatives discussed.  Questions answered.  Consent obtained.  Anesthesia:  General endotracheal.  Procedure summary:  Appropriate equipment was assembled.  The patient was brought to the operating room and identified as [name].  Safety timeout was performed. The patient was placed supine on the operating table, airway established and general anesthesia administered by Anesthesia team.   After the appropriate level of anesthesia was assured, flexible video bronchoscope was lubricated and inserted through the endotracheal tube.    Airway examination was performed bilaterally to subsegmental level.  Minimal clear secretions were noted, mucosa appeared normal and no endobronchial lesions were identified. However, the Carina DID NOT LOOK sharp. The mucosa on the superior, posterior aspect of the Left main bronchus was bulging from external pressure. The secondary carina on left side was prominent and entrance to LUL was occluded all by extrinsic mass. No endobronchial lesion identified  Endobronchial ultrasound video bronchoscope was then lubricated and inserted through the endotracheal tube. Surveillance of the mediastinal and and bilateral hilar lymph node stations was performed.  Pathologically enlarged lymph nodes were noted in STATION 7 and 10L   Endobronchial ultrasound guided transbronchial needle aspiration of 10L (passes 5), was performed. The first two was ssent on slide for ON SITE CYTOLOGY. The next 3 werere placed in CYTOLYTE.    After which EBUS bronchoscope was withdrawn. Flexible video bronchoscope was used again and  hemostasis was assured the bronchoscope was withdrawn.  The  patient was extubated in operating room and transferred to recovery unit   Post-procedure chest x-ray was ordered.  Specimens sent: TBNA 10L for cytology.  Complications:  No immediate complications were noted.  Hemodynamic parameters and oxygenation remained stable throughout the procedure.  Estimated blood loss:  None     IMPRESSION 1. On site cyology - LUNG CANCER. Not differentiated   POST PROCEDURE PLAN Recover in recovery unit Brother will be updated shortly FU with NP Tammy for resutls and decision making     Dr. Kalman Shan, M.D., [facility].C.P Pulmonary and Critical Care Medicine Staff Physician [facility] Pager: [phone], If no answer or between  15:00h - 7:00h: call [phone]  02/28/2013 8:20 AM

## 2013-03-01 ENCOUNTER — Encounter (HOSPITAL_COMMUNITY): Payer: Self-pay | Admitting: Internal Medicine

## 2013-03-03 ENCOUNTER — Ambulatory Visit (INDEPENDENT_AMBULATORY_CARE_PROVIDER_SITE_OTHER): Payer: BC Managed Care – PPO | Admitting: Adult Health

## 2013-03-03 ENCOUNTER — Encounter: Payer: Self-pay | Admitting: Adult Health

## 2013-03-03 VITALS — BP 126/82 | HR 87 | Temp 98.2°F | Ht 70.0 in | Wt 240.0 lb

## 2013-03-03 DIAGNOSIS — R222 Localized swelling, mass and lump, trunk: Secondary | ICD-10-CM

## 2013-03-03 DIAGNOSIS — R918 Other nonspecific abnormal finding of lung field: Secondary | ICD-10-CM

## 2013-03-03 DIAGNOSIS — C3492 Malignant neoplasm of unspecified part of left bronchus or lung: Secondary | ICD-10-CM

## 2013-03-03 DIAGNOSIS — J449 Chronic obstructive pulmonary disease, unspecified: Secondary | ICD-10-CM

## 2013-03-03 DIAGNOSIS — C3490 Malignant neoplasm of unspecified part of unspecified bronchus or lung: Secondary | ICD-10-CM | POA: Insufficient documentation

## 2013-03-03 NOTE — Assessment & Plan Note (Addendum)
Left infra hilar lung mass >Pt underwent EBUS w/ bx on 10/20 w/ cytology positive for SMALL CELL CARCINOMA  Case was reviewed in detail with Dr. Marchelle Gearing ,  Pt referred to Oncology and Radiation Oncology ASAP 03/03/2013  Support provided.

## 2013-03-03 NOTE — Assessment & Plan Note (Signed)
Recent flare now resolved  Cont on O2 24/7  Cont on Breo daily  follow up Dr. Marchelle Gearing in 6 weeks  Please contact office for sooner follow up if symptoms do not improve or worsen or seek emergency care

## 2013-03-03 NOTE — Progress Notes (Signed)
Subjective:    Patient ID: [name], male    DOB: 1956-02-14, 57 y.o.   MRN: [id number]  HPI 1. 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 - Clinical worsening to stage 4 Fev1 to 1.34L/37%, Ratio 49 in setting of mdi refusal ($$) - Nov 2012 - No desaturation on submaximal exertion Nov 2012 - CAT score at baseline (without med Rx) : 26  - Ma 2013  2. Ex-smoker - quit may 2011  3. Class 2-3 dyspnea but pre-op VO2 max 32ml/kg/min - CPST 02/01/2010  4. RUL PET Hot interemediate prob nodule, ENB non-diagnostic 9/28/20111. Culture: mycobacterium kansasii. Not on drug Rx  5. 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 Metabolic Syndrome with Obesitywith ESS - 3 in June 2012  7.Compliance with meds: Cost issues with MDI v reluctance to take MDI  - June 2012 - reluctant - Dec 2012 - samples given, new insurance + but still not buying mdi due to out of pocket cost   OV 09/26/2011 Six month followup for all the above multiple issues.  Feels stable.  Denies AECOPD symptoms  Off mdi - cost issues. Unable to afford even $50 per mdi.  Dysoneic with climibing 1 flight and vacuuming at house since coming off mdi Wheeiy at work env due to fabric exposure and carrying boxes No aecopd acute deterioration All symptooms above are chronic Off gabpentin too due to cost and fact pain is better  CAT COPD Symptom and Quality of Life Score - reflects below a score of 26 and high symptom burden of copd placing him in high risk category with dyspnea and cough/fatigue main issues  REC   #Obstructive lung disease  - Too bad cost is an issue  - in order to combat cost we can try nebulizer treatment; might be cheaper  - take albuterol and atrovent generic nebulizer treatment 4 times daily with a nebulizer machine  - take spiriva till we get nebulizer  - no need to take symbicort due to cost  #Neuropathic pain  - Glad you are better and off gabapentin   #Weight and diabetes  - avoid rice, breads, pastas, sugars, desserts, corn  - eat more vegetables   OV 03/04/2012   Followup COPD and obesity   COPD: stable. CAT score 26 and stble (see below). Past few days increased hoarseness of voice without change in cough, dyspnea or sputum. Says is due to weather change. Insists he is not in aECOPD. REfuses COPD flare Rx even though his exam showed wheeze. Says nebs working out better for him from $ standpiint but doing atrovent neb only bid due to work issues. Doing alb prn. REfuses neb steroids due to cost. Trying to get disability but says he needs to be laid off to apply for disability and cannot afford being laid off. Yet unhappy with work; they work him too hard despite his copd and give him bad insurance and give him hard time to take time off to visit MD  Weight: rviewed his diet. Lost 8# in 1 year but I noted he hardly eats and what he eats is mostly bread. Never eats nuts. Rarely eats vegetables.    REC  #Followup  - 9 months or sooner if needed  #COPD  - continue your medications  - you are wheezing but at your request will hold off treating this as copd flare because you feel this is because  of missed medications today  - we will give you sample of ventolin HFA if we have it or pro-air hfa  - CMA will ensure refills  - glad you haad flu shot  - we discussed rehab again but understand at this time due to work issues you cannot attend it  CAT score and walk test at followup  OV 10/06/2012  Followup for COPD.  Last seen October 2013. Since then COPD stable. No reports of exacerbations. COPD cat score is 24 and is on baseline. He is using Atrovent nebulizer at home. His main issues are not COPD but other medical and financial issues reviewed in past medical history below. rec Copd appears stable Start breo 1 puff daily; learn technique  - Call for samples in future Use atroven nebs as needed   02/09/2013 f/u ov/[name] re: aecopd  off meds  Chief Complaint  Patient presents with  . Acute Visit    Reports SOB, coughing, headaches. States that none of his medications aren't helping. Onset was 3 weeks ago.    All he has for rescue neb is atovent and now sob at rest x within sev hours of neb assoc with chest tightness and subj wheeze sith congested cough > thick white mucus  >>PPI   03/03/2013 Bx follow up  Pt was recently admitted with increased dyspnea w/ COPD flare , found to have a left infra hilar mass on CT . Workup w/ positive  PET scan -hypermetabolic mass and  mediastinal lymphadenopathy. No evidence of extrathracic metastatic disease. Pt underwent EBUS w/ bx on 10/20 w/ cytology positive for SMALL CELL CARCINOMA  Pt was advised of these results. And support was provided. He has no  Wt loss, hemoptysis, appetite is fair.  He does feel angry regarding the diagnosis.  He quit smoking ~2010.  On O2 24/7 . Did not wear to office today.  He is accompanied by niece and says he has supportive family .  Says dyspnea is at baseline, without flare of cough or wheezing. Continues on [place] daily .  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 no further rx   Current Medications, Allergies, Complete Past Medical History, Past Surgical History, Family History, and Social History were reviewed in [employer] record.  ROS  The following are not active complaints unless bolded sore throat, dysphagia, dental problems, itching, sneezing,  nasal congestion or excess/ purulent secretions, ear ache,   fever, chills, sweats, unintended wt loss, pleuritic or  exertional cp, hemoptysis,  orthopnea pnd or leg swelling, presyncope, palpitations, heartburn, abdominal pain, anorexia, nausea, vomiting, diarrhea  or change in bowel or urinary habits, change in stools or urine, dysuria,hematuria,  rash, arthralgias, visual complaints, headache, numbness weakness or ataxia or problems with walking or coordination,  change in mood/affect or memory.              Objective:   Physical Exam   HENT:  Head: Normocephalic and atraumatic.  Right Ear: External ear normal.  Left Ear: External ear normal.  Mouth/Throat: Oropharynx is clear and moist. No oropharyngeal exudate.  Eyes: Conjunctivae and EOM are  normal. Pupils are equal, round, and reactive to light. Right eye exhibits no discharge. Left eye exhibits no discharge. No scleral icterus.  Neck: Normal range of motion. Neck supple. No JVD present. No tracheal deviation present. No thyromegaly present.  Cardiovascular: Normal rate, regular rhythm and intact distal pulses. Exam reveals no gallop and no friction rub.  No murmur heard.  Pulmonary/Chest: few rhonchi  Abdominal: Soft. Bowel sounds are normal. He exhibits no distension and no mass. There is no tenderness. There is no rebound and no guarding.  Musculoskeletal: Normal range of motion. He exhibits no edema and no tenderness.  Lymphadenopathy:  He has no cervical adenopathy.  Neurological: He is alert and oriented to person, place, and time. He has normal reflexes. No cranial nerve deficit. Coordination normal.  Skin: Skin is warm and dry. No rash noted. He is not diaphoretic. No erythema. No pallor.  Psychiatric: FLAT AFFECT             Assessment & Plan:

## 2013-03-03 NOTE — Patient Instructions (Signed)
We are referring you to Oncology and Radiation Oncology .  Continue on Oxygen 2 l/m 24/7 .  Continue on Breo 1 puff daily  follow up Dr. Marchelle Gearing in 4-6 weeks and As needed   Please contact office for sooner follow up if symptoms do not improve or worsen or seek emergency care

## 2013-03-04 ENCOUNTER — Encounter: Payer: Self-pay | Admitting: Radiation Oncology

## 2013-03-04 ENCOUNTER — Telehealth: Payer: Self-pay | Admitting: *Deleted

## 2013-03-04 ENCOUNTER — Ambulatory Visit
Admission: RE | Admit: 2013-03-04 | Discharge: 2013-03-04 | Disposition: A | Discharge status: Home / Self Care | Payer: BC Managed Care – PPO | Source: Ambulatory Visit | Attending: Radiation Oncology | Admitting: Radiation Oncology

## 2013-03-04 VITALS — BP 89/59 | HR 69 | Temp 98.0°F | Resp 24 | Wt 219.4 lb

## 2013-03-04 DIAGNOSIS — C3432 Malignant neoplasm of lower lobe, left bronchus or lung: Secondary | ICD-10-CM | POA: Insufficient documentation

## 2013-03-04 DIAGNOSIS — I129 Hypertensive chronic kidney disease with stage 1 through stage 4 chronic kidney disease, or unspecified chronic kidney disease: Secondary | ICD-10-CM | POA: Insufficient documentation

## 2013-03-04 DIAGNOSIS — E119 Type 2 diabetes mellitus without complications: Secondary | ICD-10-CM | POA: Insufficient documentation

## 2013-03-04 DIAGNOSIS — Z79899 Other long term (current) drug therapy: Secondary | ICD-10-CM | POA: Insufficient documentation

## 2013-03-04 DIAGNOSIS — Z87891 Personal history of nicotine dependence: Secondary | ICD-10-CM | POA: Insufficient documentation

## 2013-03-04 DIAGNOSIS — C801 Malignant (primary) neoplasm, unspecified: Secondary | ICD-10-CM | POA: Insufficient documentation

## 2013-03-04 DIAGNOSIS — C3490 Malignant neoplasm of unspecified part of unspecified bronchus or lung: Secondary | ICD-10-CM | POA: Insufficient documentation

## 2013-03-04 DIAGNOSIS — C3492 Malignant neoplasm of unspecified part of left bronchus or lung: Secondary | ICD-10-CM

## 2013-03-04 DIAGNOSIS — Z8673 Personal history of transient ischemic attack (TIA), and cerebral infarction without residual deficits: Secondary | ICD-10-CM | POA: Insufficient documentation

## 2013-03-04 DIAGNOSIS — J438 Other emphysema: Secondary | ICD-10-CM | POA: Insufficient documentation

## 2013-03-04 DIAGNOSIS — E785 Hyperlipidemia, unspecified: Secondary | ICD-10-CM | POA: Insufficient documentation

## 2013-03-04 DIAGNOSIS — I252 Old myocardial infarction: Secondary | ICD-10-CM | POA: Insufficient documentation

## 2013-03-04 DIAGNOSIS — N189 Chronic kidney disease, unspecified: Secondary | ICD-10-CM | POA: Insufficient documentation

## 2013-03-04 DIAGNOSIS — I2510 Atherosclerotic heart disease of native coronary artery without angina pectoris: Secondary | ICD-10-CM | POA: Insufficient documentation

## 2013-03-04 DIAGNOSIS — Z7982 Long term (current) use of aspirin: Secondary | ICD-10-CM | POA: Insufficient documentation

## 2013-03-04 NOTE — Progress Notes (Addendum)
Radiation Oncology         [phone]) [phone] ________________________________  Initial outpatient Consultation - Date: 03/04/2013   Name: [name] MRN: [id number]   DOB: 10-07-1955  REFERRING PHYSICIAN: Kalman Shan, MD  DIAGNOSIS:  1. Small cell carcinoma of lung, left     HISTORY OF PRESENT ILLNESS::[name] is a 57 y.o. male  the emergency room with complaints of shortness of breath. He thought perhaps he had a pneumonia. Workup at that time revealed a mass in the left upper lobe. A CT scan followed showing a infrahilar mass with left hilar or mediastinal adenopathy with severe emphysema and atherosclerosis. A PET/CT was performed on 02/24/2013 which showed a large mass in the left lower lobe encasing the left pulmonary artery with an SUV of 13.9. Hypermetabolic mediastinal adenopathy was noted adjacent to the pulmonary artery and the lateral aorta as well as subcarinal lymph nodes. No contralateral hilar disease was noted. No evidence of metastatic disease. He underwent bronchoscopy and biopsy on 02/28/2013 which showed small cell carcinoma in an E. this up to 10 L. lymph node. He was referred by Dr. Marchelle Gearing for consideration of radiation in the management of his newly diagnosed lung cancer. He is accompanied by her sister today. He denies any weight loss or hemoptysis. He has no headaches or bone pain. He does not have a cough. He denies any fevers or chills.Marland Kitchen  PREVIOUS RADIATION THERAPY: No  PAST MEDICAL HISTORY:  has a past medical history of CAD (coronary artery disease); Diabetes mellitus type II; Hyperlipidemia; COPD (chronic obstructive pulmonary disease); CKD (chronic kidney disease); Obesity; History of CVA (cerebrovascular accident) (2007); Mass; CHF (congestive heart failure); Myocardial infarction; Hypertension; Asthma; Shortness of breath; Pneumonia; Stroke; and Cancer.    PAST SURGICAL HISTORY: Past Surgical History  Procedure Laterality Date  . Wedge resection   03/28/2010  . Coronary stent placement  2009  . Cardiac catheterization  2009  . Endobronchial ultrasound Bilateral 02/28/2013    Procedure: ENDOBRONCHIAL ULTRASOUND;  Surgeon: Kalman Shan, MD;  Location: [facility];  Service: Cardiopulmonary;  Laterality: Bilateral;    FAMILY HISTORY:  Family History  Problem Relation Age of Onset  . Stroke Other   . Diabetes Mother   . Cancer Maternal Uncle     SOCIAL HISTORY:  History  Substance Use Topics  . Smoking status: Former Smoker -- 1.00 packs/day for 40 years    Types: Cigarettes    Quit date: 09/09/2009  . Smokeless tobacco: Not on file  . Alcohol Use: No     Comment: rarely    ALLERGIES: Erythromycin and Penicillins  MEDICATIONS:  Current Outpatient Prescriptions  Medication Sig Dispense Refill  . albuterol (PROAIR HFA) 108 (90 BASE) MCG/ACT inhaler Inhale 2 puffs into the lungs every 6 (six) hours as needed for wheezing or shortness of breath.      Marland Kitchen albuterol (PROVENTIL) (2.5 MG/3ML) 0.083% nebulizer solution Take 3 mLs (2.5 mg total) by nebulization 5 (five) times daily as needed for wheezing or shortness of breath.  75 mL  12  . amLODipine (NORVASC) 2.5 MG tablet Take 2.5 mg by mouth daily.      Marland Kitchen aspirin EC 81 MG tablet Take 81 mg by mouth daily.      Marland Kitchen atorvastatin (LIPITOR) 40 MG tablet Take 40 mg by mouth at bedtime.      . bisoprolol (ZEBETA) 5 MG tablet Take 5 mg by mouth daily.      . clopidogrel (PLAVIX) 75 MG  tablet Take 1 tablet (75 mg total) by mouth daily. Resume once cleared by the lung doctor after bronchoscopy and lung biopsy      . Fluticasone Furoate-Vilanterol (BREO ELLIPTA) 100-25 MCG/INH AEPB Inhale 1 puff into the lungs 2 (two) times daily.  28 each  1  . glipiZIDE (GLUCOTROL) 10 MG tablet Take 10 mg by mouth 2 (two) times daily before a meal.       . isosorbide mononitrate (IMDUR) 60 MG 24 hr tablet Take 1 tablet (60 mg total) by mouth daily.  30 tablet  8  . losartan (COZAAR) 50 MG tablet Take  1 tablet (50 mg total) by mouth daily.  30 tablet  8  . metFORMIN (GLUCOPHAGE-XR) 750 MG 24 hr tablet Take 1,500 mg by mouth daily with breakfast.      . nitroGLYCERIN (NITROSTAT) 0.4 MG SL tablet Place 0.4 mg under the tongue every 5 (five) minutes as needed for chest pain. May repeat up to 3 doses       No current facility-administered medications for this encounter.    REVIEW OF SYSTEMS:  A 15 point review of systems is documented in the electronic medical record. This was obtained by the nursing staff. However, I reviewed this with the patient to discuss relevant findings and make appropriate changes.  Pertinent items are noted in HPI.  PHYSICAL EXAM:  Filed Vitals:   03/04/13 1426  BP: 89/59  Pulse: 69  Temp: 98 F (36.7 C)  Resp: 24  .219 lb 6.4 oz (99.519 kg). He is a pleasant male sitting comfortably in a wheelchair. He has some pursed lip breathing. He is alert and oriented x3. His strength is 5 out of 5 in his bilateral upper and lower extremities. ECOG performance status 2.   LABORATORY DATA:  Lab Results  Component Value Date   WBC 11.3* 02/16/2013   HGB 13.6 02/16/2013   HCT 37.6* 02/16/2013   MCV 92.6 02/16/2013   PLT 193 02/16/2013   Lab Results  Component Value Date   NA 133* 02/16/2013   K 4.5 02/16/2013   CL 97 02/16/2013   CO2 24 02/16/2013   Lab Results  Component Value Date   ALT 18 02/16/2013   AST 13 02/16/2013   ALKPHOS 86 02/16/2013   BILITOT 0.3 02/16/2013     RADIOGRAPHY: Dg Chest 2 View  02/14/2013   *RADIOLOGY REPORT*  Clinical Data: Initial encounter for 3-week history of shortness of breath, productive cough, and chest pain related to the cough. Former smoker who quit approximately 5 years ago.  Current history of COPD.  Prior history of wedge resection of a right upper lobe mass in September, 2011, pathology revealing necrotizing granulomas.  CHEST - 2 VIEW  Comparison: Two-view chest x-ray 06/12/2010, 05/01/2010, 02/05/2010.  PET CT 01/17/2010.   Findings: Interval development of a mass in the central left upper lobe in the vicinity of the left hilum.  The mass approximates 7.4 x 4.6 cm on the PA image, but is difficult to measure on the lateral image.  Cardiac silhouette normal in size, unchanged. Hilar and mediastinal contours otherwise unremarkable.  Minimal streaky airspace opacity in the left upper lobe.  Postsurgical changes in the right upper lobe with pleuroparenchymal scarring at the right base, unchanged.  No new abnormalities in the right lung. No pleural effusions.  Degenerative changes involving the thoracic spine.  IMPRESSION: Large mass involving the central left upper lobe, worrisome for a bronchogenic carcinoma.  Mild post-obstructive atelectasis and/or  pneumonitis in the left upper lobe.   Original Report Authenticated By: Hulan Saas, M.D.   Ct Chest W Contrast  02/14/2013   *RADIOLOGY REPORT*  Clinical Data: Dyspnea, mass seen on chest x-ray  CT CHEST WITH CONTRAST  Technique:  Multidetector CT imaging of the chest was performed following the standard protocol during bolus administration of intravenous contrast.  Contrast: 75mL OMNIPAQUE IOHEXOL 300 MG/ML SOLN  Comparison: Chest x-ray earlier today at 2028 hours; prior chest CT 01/03/2010; prior PET CT 01/17/2010  Findings:  Mediastinum: Bulky left hilar and mediastinal adenopathy.  An index prevascular lymph node measures 3.2 x 1.7 cm.  A left hilar nodal mass adjacent to the main pulmonary artery measures 3.5 x 2.6 cm. Left hilar nodal conglomerate posterior to the left main pulmonary artery measures 4.2 x 2.3 cm.  Left infrahilar mass like no conglomeration measures 5.5 x 4.1 cm.  There is associated mass effect on the left main pulmonary artery and the lower lobe branches.  No definite supraclavicular or right hilar adenopathy. Nonspecific subcarinal node measures 12 mm in short axis.  Para esophageal nodes have been present dating back to 2011 and likely benign.   Heart/Vascular: Conventional three-vessel arch anatomy.  The heart is within normal limits for size.  Atherosclerotic calcifications noted throughout the coronary arteries.  No pericardial effusion. No large central PE.  Lungs/Pleura:  The advanced centrilobular emphysema.  Cough, bronchovascular distribution of tree in bud micronodularity extending in the anterobasal segment the left lower lobe inferior to the left infrahilar mass lesion.  Findings are most consistent with postobstructive changes.  Surgical changes in the right upper lung consistent with prior wedge resection.  Upper Abdomen: 4 mm hypoattenuating focus in hepatic segment to just inferior to the diaphragm is too small to accurately characterize.  Otherwise, no focal hepatic lesion or upper abdominal evidence of metastatic disease.  Mild fatty atrophy of the pancreas.  Scattered atherosclerotic vascular calcifications.  Bones: No acute fracture or aggressive appearing lytic or blastic osseous lesion.  Multiple remote healed right-sided rib fractures.  IMPRESSION:  1.  Left infrahilar mass lesion with probable metastatic left hilar and mediastinal adenopathy and mild postobstructive changes distally in the anterobasal segment of the left lower lung. Findings are highly concerning for primary bronchogenic carcinoma such as small cell lung cancer. Recommend further evaluation with outpatient PET CT followed by endobronchial biopsy.  If endobronchial biopsy is nondiagnostic, CT guided biopsy could be considered.  2.  Severe centrilobular pulmonary emphysema.  3.  Atherosclerosis including coronary artery disease  4.  Small 4 mm low attenuation lesion in the liver is nonspecific and warrants attention on follow-up imaging.   Original Report Authenticated By: Malachy Moan, M.D.   Nm Pet Image Restag (ps) Skull Base To Thigh  02/24/2013   CLINICAL DATA:  Subsequent treatment strategy for lung carcinoma. New left lung mass.  EXAM: NUCLEAR MEDICINE  PET SKULL BASE TO THIGH  FASTING BLOOD GLUCOSE:  Value:  68 mg/dl  TECHNIQUE: 66.4 mCi Q-03 FDG was injected intravenously. CT data was obtained and used for attenuation correction and anatomic localization only. (This was not acquired as a diagnostic CT examination.) Additional exam technical data entered on technologist worksheet.  COMPARISON:  Chest CT on 02/14/2013 and prior PET-CT on 01/17/2010  FINDINGS: NECK  No hypermetabolic lymph nodes in the neck.  CHEST  A large mass is seen in the central left lower lobe which is contiguous with the left hilum and encases the left pulmonary  artery. This mass has a maximum SUV 13.9. There is also hypermetabolic mediastinal lymphadenopathy on the left adjacent to the pulmonary artery and its and lateral aortic region as well as in the subcarinal region.  No hypermetabolic activity seen in the right hilum. CT images show a pulmonary emphysema, but no other suspicious pulmonary nodules or masses are identified. Postop changes are noted from prior right thoracotomy.  ABDOMEN/PELVIS  No abnormal hypermetabolic activity within the liver, pancreas, adrenal glands, or spleen. No hypermetabolic lymph nodes in the abdomen or pelvis.  SKELETON  No focal hypermetabolic activity to suggest skeletal metastasis.  IMPRESSION: Large hypermetabolic mass in the central left lower lobe, which involves the left hilum and encases the left pulmonary artery, consistent with primary bronchogenic carcinoma.  Metastatic mediastinal lymphadenopathy adjacent to left pulmonary artery, and in lateral aortic, and subcarinal regions.  No evidence of extrathoracic metastatic disease.   Electronically Signed   By: Myles Rosenthal M.D.   On: 02/24/2013 14:29      IMPRESSION: 57 year old male with a history of stroke, MI, wedge resection now with newly diagnosed limited stage small cell lung cancer  PLAN: I spoke to [name] his sister at length regarding his diagnosis and options for treatment. We  discussed the role of radiation in controlling local disease as well as the role of chemotherapy in addressing systemic disease. We discussed that sometimes radiation starts along with chemotherapy and at other times Dr. Arbutus Ped for first to give a few cycles of chemotherapy to provide some symptomatic relief. He discussed the process of simulation the placement tattoos. We discussed 6 weeks of treatment as an outpatient. We discussed the possible side effects of treatment including but not limited to fatigue esophagitis and damage to his normal lung causing increasing shortness of breath. We discussed that the side effects can be permanent or temporary. Alternatively plan on simulating him on Tuesday His appointment Dr. Shirline Frees is on Monday. He also requires a brain MRI to fully complete his staging. I have referred him to our dietician as well as to social work as he appears to be somewhat angry regarding his diagnosis.   I spent 40 minutes  face to face with the patient and more than 50% of that time was spent in counseling and/or coordination of care.   ------------------------------------------------  Lurline Hare, MD

## 2013-03-04 NOTE — Progress Notes (Signed)
Thoracic Location of Tumor / Histology:Central left lower lobe of lung with metastatic mediastinal lymphadenopathy of adjacent left pulmonary artery and lateral aortic and subcarinal regions.  Patient presented with increased shortness of breath and cough 3 weeks ago, pneumonia and has h/o of copd  Biopsies of  Diagnosis FINE NEEDLE ASPIRATION: NEEDLE ASPIRATION, ENDOSCOPIC EBUS #1, 10L (SPECIMEN 1 OF 1 COLLECTED 02-28-2013) MALIGNANT CELLS PRESENT, CONSISTENT WITH SMALL CELL CARCINOMA. PLEASE SEE COMMENT. Preliminary Diagnosis MALIGNANT CELLS PRESENT (HCL) H. CATHERINE  Tobacco/Marijuana/Snuff/ETOH ZOX:WRUE smoking in 2011  Past/Anticipated interventions by cardiothoracic surgery, if any:Had wedge resection in 2011.Endobronchial ultrasound guided transbronchial aspiration on 02/28/13.  Past/Anticipated interventions by medical oncology, if any:Dr.Mohamed 03/07/13  Signs/Symptoms  Weight changes, if AVW:[id number]  Respiratory complaints, if any: shortness of breath  Hemoptysis, if any:NO  Pain issues, if any: No  SAFETY ISSUES:  Prior radiation?No  Pacemaker/ICDNo  Possible current pregnancy? 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

## 2013-03-04 NOTE — Progress Notes (Signed)
Please see the Nurse Progress Note in the MD Initial Consult Encounter for this patient. 

## 2013-03-04 NOTE — Telephone Encounter (Signed)
Left vm message regarding app for Dr. Arbutus Ped 03/07/13 at 2:15 labs at 2:00

## 2013-03-04 NOTE — Telephone Encounter (Signed)
Called patient to inform of test and appts., lvm for a return call

## 2013-03-07 ENCOUNTER — Other Ambulatory Visit (HOSPITAL_BASED_OUTPATIENT_CLINIC_OR_DEPARTMENT_OTHER): Payer: BC Managed Care – PPO | Admitting: Lab

## 2013-03-07 ENCOUNTER — Other Ambulatory Visit: Payer: Self-pay | Admitting: Internal Medicine

## 2013-03-07 ENCOUNTER — Encounter: Payer: Self-pay | Admitting: Internal Medicine

## 2013-03-07 ENCOUNTER — Ambulatory Visit (HOSPITAL_BASED_OUTPATIENT_CLINIC_OR_DEPARTMENT_OTHER): Payer: BC Managed Care – PPO | Admitting: Internal Medicine

## 2013-03-07 ENCOUNTER — Ambulatory Visit: Payer: BC Managed Care – PPO

## 2013-03-07 ENCOUNTER — Telehealth: Payer: Self-pay | Admitting: Internal Medicine

## 2013-03-07 VITALS — BP 121/69 | HR 65 | Temp 98.0°F | Resp 18 | Ht 70.0 in | Wt 232.7 lb

## 2013-03-07 DIAGNOSIS — C3410 Malignant neoplasm of upper lobe, unspecified bronchus or lung: Secondary | ICD-10-CM

## 2013-03-07 DIAGNOSIS — C3490 Malignant neoplasm of unspecified part of unspecified bronchus or lung: Secondary | ICD-10-CM

## 2013-03-07 DIAGNOSIS — C3492 Malignant neoplasm of unspecified part of left bronchus or lung: Secondary | ICD-10-CM

## 2013-03-07 LAB — COMPREHENSIVE METABOLIC PANEL (CC13)
ALT: 21 U/L (ref 0–55)
AST: 14 U/L (ref 5–34)
Alkaline Phosphatase: 93 U/L (ref 40–150)
Anion Gap: 9 mEq/L (ref 3–11)
BUN: 26.1 mg/dL — ABNORMAL HIGH (ref 7.0–26.0)
CO2: 29 mEq/L (ref 22–29)
Calcium: 9.7 mg/dL (ref 8.4–10.4)
Chloride: 101 mEq/L (ref 98–109)
Creatinine: 1.5 mg/dL — ABNORMAL HIGH (ref 0.7–1.3)
Glucose: 112 mg/dl (ref 70–140)
Total Bilirubin: 0.68 mg/dL (ref 0.20–1.20)

## 2013-03-07 LAB — CBC WITH DIFFERENTIAL/PLATELET
BASO%: 1.1 % (ref 0.0–2.0)
Basophils Absolute: 0.1 10*3/uL (ref 0.0–0.1)
EOS%: 1.5 % (ref 0.0–7.0)
HCT: 44.4 % (ref 38.4–49.9)
HGB: 14.8 g/dL (ref 13.0–17.1)
LYMPH%: 17.9 % (ref 14.0–49.0)
MCH: 31.9 pg (ref 27.2–33.4)
MCHC: 33.3 g/dL (ref 32.0–36.0)
MONO#: 0.7 10*3/uL (ref 0.1–0.9)
NEUT#: 7.7 10*3/uL — ABNORMAL HIGH (ref 1.5–6.5)
NEUT%: 73.2 % (ref 39.0–75.0)
Platelets: 183 10*3/uL (ref 140–400)
WBC: 10.6 10*3/uL — ABNORMAL HIGH (ref 4.0–10.3)

## 2013-03-07 NOTE — Progress Notes (Signed)
[facility] [phone]   [phone]  CONSULT NOTE  REFERRING PHYSICIAN: Dr. Cyril Mourning  REASON FOR CONSULTATION:  57 years old white male recently diagnosed with lung cancer  HPI [name] is a 57 y.o. male was past medical history significant for multiple medical problems including history of coronary artery disease status post stent placement, diabetes mellitus, COPD, dyslipidemia, chronic kidney disease, stroke, congestive heart failure, myocardial infarction, hypertension, asthma as well as long history of smoking but quit 5 years ago. The patient had that few episodes of pneumonia recently the last one was in early October of 2014. Chest x-ray was performed on 02/14/2013 during his evaluation at [facility] was questionable pneumonia. It showed interval development of a mass in the central left upper lobe in the vicinity of the left hilum. This was followed by CT scan of the chest with contrast on the same day and it showed bulky left hilar and mediastinal adenopathy. An index prevascular lymph node measured 3.2 x 1.7 CM. A left hilar node a mass adjacent to the main pulmonary artery measures 3.5 x 2.6 CMP there is a left hilar nodal conglomerate posterior to the left main pulmonary artery measures 4.2 x 2.3 CM. There was also a left infrahilar mass like nodal conglomerate measured 5.5 x 4.1 CM. There is associated mass effect on the left main pulmonary artery and left lower lobe branches. The patient was seen by Dr. Vassie Loll and a PET scan was performed on 02/24/2013. It showed a large hypermetabolic mass in the central left lower lobe which involve the left hilum and encased the left pulmonary artery, consistent with primary bronchogenic carcinoma. There was metastatic mediastinal lymphadenopathy adjacent to the left pulmonary artery and in the lateral aortic and subcarinal regions. No evidence of extrathoracic metastatic disease. On 02/28/2013 the  patient underwent flexible bronchoscopy with transbronchial needle aspiration of the 10L lung mass.  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 There are malignant cells with very high N/C ratio, tumor necrosis and nuclear molding. Immunostains were performed and the tumor cells are positive for synaptophysin, CK AE1/AE3, negative for chromogranin and TTF-1 with appropriate controls. 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  Dr. Vassie Loll kindly referred the patient to me today for further evaluation and recommendation regarding treatment of his condition.  When seen today the patient is feeling fine with no specific complaints except for mild dry cough. He denied having any significant chest pain, shortness of breath or hemoptysis. The patient just a few pounds recently but no significant night sweats. He denied having any nausea or vomiting or change in bowel movement. The patient denied having any headache or visual changes. He scheduled for MRI of the brain on 03/15/2013. His family history significant for a maternal grandfather and uncle with throat cancer. Mother had diabetes and heart disease and father COPD. The patient is single and has no children. He was accompanied by his sister Elita Quick. He is currently on disability and used to work as a Horticulturist, commercial. The patient has a history of smoking one pack per day for around 35 years, but quit 5 years ago. No history of alcohol drug abuse.  HPI  Past Medical History  Diagnosis Date  . CAD (coronary artery disease)     a. PCI 3/08 to OM2 and mid CFX; b.  NSTEMI 6/10:  Prior stents patent, 90% dCFX,  mRCA occl (old) with collats;  EF 55% on LV-gram => Xience DES 2.5 x 23 mm to distal CFX;  c. abnl MV 12/13 => LHC 04/23/12:  pLAD 50%, oOM1 80%, long stented segment of the CFX extending into the PLOM with the PLOM totally occluded ostially, CFX 70% ISR, mCFX 80% ISR, pRCA chronically  occluded. Med Rx planned  . Diabetes mellitus type II   . Hyperlipidemia   . COPD (chronic obstructive pulmonary disease)     Quit tobacco 09/2009; Gold Stage 2 with asthma component - FEV1 1.53 L/54%, 14% fev1 BD response, DLCO 54% - July 2011; MM genotype 01/07/10; unable to afford spiriva and unwilling to try ics due to prior renal failure: state to Dr. Marchelle Gearing, Aug 2011; started on atrovent HFA fall 2011. no desturation on walk test Dec 2011  . CKD (chronic kidney disease)   . Obesity   . History of CVA (cerebrovascular accident) 2007    Without residual defecits  . 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 no further rx  . CHF (congestive heart failure)   . Myocardial infarction     x3  . Hypertension   . Asthma     as child  . Shortness of breath     constant/wears 2 liters  . Pneumonia     02/14/2013 in hospital  . Stroke     7 years- no problems   . Cancer     lung cancer-02/14/2013    Past Surgical History  Procedure Laterality Date  . Wedge resection  03/28/2010  . Coronary stent placement  2009  . Cardiac catheterization  2009  . Endobronchial ultrasound Bilateral 02/28/2013    Procedure: ENDOBRONCHIAL ULTRASOUND;  Surgeon: Kalman Shan, MD;  Location: [facility];  Service: Cardiopulmonary;  Laterality: Bilateral;    Family History  Problem Relation Age of Onset  . Stroke Other   . Diabetes Mother   . Cancer Maternal Uncle     Social History History  Substance Use Topics  . Smoking status: Former Smoker -- 1.00 packs/day for 40 years    Types: Cigarettes    Quit date: 09/09/2009  . Smokeless tobacco: Not on file  . Alcohol Use: No     Comment: rarely     Allergies  Allergen Reactions  . Erythromycin Other (See Comments)    Reaction while in hospital - pt doesn't know reaction  . Penicillins Other (See Comments)    Childhood reaction - unknown    Current Outpatient Prescriptions  Medication Sig Dispense Refill  . albuterol (PROAIR HFA) 108 (90 BASE) MCG/ACT inhaler Inhale 2 puffs into the lungs every 6 (six) hours as needed for wheezing or shortness of breath.      Marland Kitchen amLODipine (NORVASC) 2.5 MG tablet Take 2.5 mg by mouth daily.      Marland Kitchen aspirin EC 81 MG tablet Take 81 mg by mouth daily.      Marland Kitchen atorvastatin (LIPITOR) 40 MG tablet Take 40 mg by mouth at bedtime.      . bisoprolol (ZEBETA) 5 MG tablet Take 5 mg by mouth daily.      . clopidogrel (PLAVIX) 75 MG tablet Take 1 tablet (75 mg total) by mouth daily. Resume once cleared by  the lung doctor after bronchoscopy and lung biopsy      . Fluticasone Furoate-Vilanterol (BREO ELLIPTA) 100-25 MCG/INH AEPB Inhale 1 puff into the lungs 2 (two) times daily.  28 each  1  . glipiZIDE (GLUCOTROL) 10 MG tablet Take 10 mg by mouth 2 (two) times daily before a meal.       . isosorbide mononitrate (IMDUR) 60 MG 24 hr tablet Take 1 tablet (60 mg total) by mouth daily.  30 tablet  8  . losartan (COZAAR) 50 MG tablet Take 1 tablet (50 mg total) by mouth daily.  30 tablet  8  . metFORMIN (GLUCOPHAGE-XR) 750 MG 24 hr tablet Take 1,500 mg by mouth daily with breakfast.      . albuterol (PROVENTIL) (2.5 MG/3ML) 0.083% nebulizer solution Take 3 mLs (2.5 mg total) by nebulization 5 (five) times daily as needed for wheezing or shortness of breath.  75 mL  12  . nitroGLYCERIN (NITROSTAT) 0.4 MG SL tablet Place 0.4 mg under the tongue every 5 (five) minutes as needed for chest pain. May repeat up to 3 doses       No current facility-administered medications for this visit.    Review of Systems  Constitutional: negative Eyes: negative Ears, nose, mouth, throat, and face: negative Respiratory: positive for  cough Cardiovascular: negative Gastrointestinal: negative Genitourinary:negative Integument/breast: negative Hematologic/lymphatic: negative Musculoskeletal:negative Neurological: negative Behavioral/Psych: negative Endocrine: negative Allergic/Immunologic: negative  Physical Exam  ZOX:WRUEA, healthy, no distress, well nourished and well developed SKIN: skin color, texture, turgor are normal HEAD: Normocephalic, No masses, lesions, tenderness or abnormalities EYES: normal, PERRLA EARS: External ears normal, Canals clear OROPHARYNX:no exudate, no erythema and lips, buccal mucosa, and tongue normal  NECK: supple, no adenopathy, no JVD LYMPH:  no palpable lymphadenopathy, no hepatosplenomegaly LUNGS: clear to auscultation , and palpation HEART: regular rate & rhythm, no murmurs and no gallops ABDOMEN:abdomen soft, non-tender, obese, normal bowel sounds and no masses or organomegaly BACK: Back symmetric, no curvature., No CVA tenderness EXTREMITIES:no joint deformities, effusion, or inflammation, no edema, no skin discoloration, no clubbing  NEURO: alert & oriented x 3 with fluent speech, no focal motor/sensory deficits  PERFORMANCE STATUS: ECOG 1  LABORATORY DATA: Lab Results  Component Value Date   WBC 11.3* 02/16/2013   HGB 13.6 02/16/2013   HCT 37.6* 02/16/2013   MCV 92.6 02/16/2013   PLT 193 02/16/2013      Chemistry      Component Value Date/Time   NA 133* 02/16/2013 0600   K 4.5 02/16/2013 0600   CL 97 02/16/2013 0600   CO2 24 02/16/2013 0600   BUN 30* 02/16/2013 0600   CREATININE 1.49* 02/16/2013 0600   CREATININE 1.46* 12/10/2010 0843      Component Value Date/Time   CALCIUM 9.1 02/16/2013 0600   ALKPHOS 86 02/16/2013 0600   AST 13 02/16/2013 0600   ALT 18 02/16/2013 0600   BILITOT 0.3 02/16/2013 0600       RADIOGRAPHIC STUDIES: Dg Chest 2 View  02/14/2013   *RADIOLOGY REPORT*  Clinical Data: Initial encounter for 3-week history of shortness of breath, productive  cough, and chest pain related to the cough. Former smoker who quit approximately 5 years ago.  Current history of COPD.  Prior history of wedge resection of a right upper lobe mass in September, 2011, pathology revealing necrotizing granulomas.  CHEST - 2 VIEW  Comparison: Two-view chest x-ray 06/12/2010, 05/01/2010, 02/05/2010.  PET CT 01/17/2010.  Findings: Interval development of a mass in the  central left upper lobe in the vicinity of the left hilum.  The mass approximates 7.4 x 4.6 cm on the PA image, but is difficult to measure on the lateral image.  Cardiac silhouette normal in size, unchanged. Hilar and mediastinal contours otherwise unremarkable.  Minimal streaky airspace opacity in the left upper lobe.  Postsurgical changes in the right upper lobe with pleuroparenchymal scarring at the right base, unchanged.  No new abnormalities in the right lung. No pleural effusions.  Degenerative changes involving the thoracic spine.  IMPRESSION: Large mass involving the central left upper lobe, worrisome for a bronchogenic carcinoma.  Mild post-obstructive atelectasis and/or pneumonitis in the left upper lobe.   Original Report Authenticated By: Hulan Saas, M.D.   Ct Chest W Contrast  02/14/2013   *RADIOLOGY REPORT*  Clinical Data: Dyspnea, mass seen on chest x-ray  CT CHEST WITH CONTRAST  Technique:  Multidetector CT imaging of the chest was performed following the standard protocol during bolus administration of intravenous contrast.  Contrast: 75mL OMNIPAQUE IOHEXOL 300 MG/ML SOLN  Comparison: Chest x-ray earlier today at 2028 hours; prior chest CT 01/03/2010; prior PET CT 01/17/2010  Findings:  Mediastinum: Bulky left hilar and mediastinal adenopathy.  An index prevascular lymph node measures 3.2 x 1.7 cm.  A left hilar nodal mass adjacent to the main pulmonary artery measures 3.5 x 2.6 cm. Left hilar nodal conglomerate posterior to the left main pulmonary artery measures 4.2 x 2.3 cm.  Left infrahilar  mass like no conglomeration measures 5.5 x 4.1 cm.  There is associated mass effect on the left main pulmonary artery and the lower lobe branches.  No definite supraclavicular or right hilar adenopathy. Nonspecific subcarinal node measures 12 mm in short axis.  Para esophageal nodes have been present dating back to 2011 and likely benign.  Heart/Vascular: Conventional three-vessel arch anatomy.  The heart is within normal limits for size.  Atherosclerotic calcifications noted throughout the coronary arteries.  No pericardial effusion. No large central PE.  Lungs/Pleura:  The advanced centrilobular emphysema.  Cough, bronchovascular distribution of tree in bud micronodularity extending in the anterobasal segment the left lower lobe inferior to the left infrahilar mass lesion.  Findings are most consistent with postobstructive changes.  Surgical changes in the right upper lung consistent with prior wedge resection.  Upper Abdomen: 4 mm hypoattenuating focus in hepatic segment to just inferior to the diaphragm is too small to accurately characterize.  Otherwise, no focal hepatic lesion or upper abdominal evidence of metastatic disease.  Mild fatty atrophy of the pancreas.  Scattered atherosclerotic vascular calcifications.  Bones: No acute fracture or aggressive appearing lytic or blastic osseous lesion.  Multiple remote healed right-sided rib fractures.  IMPRESSION:  1.  Left infrahilar mass lesion with probable metastatic left hilar and mediastinal adenopathy and mild postobstructive changes distally in the anterobasal segment of the left lower lung. Findings are highly concerning for primary bronchogenic carcinoma such as small cell lung cancer. Recommend further evaluation with outpatient PET CT followed by endobronchial biopsy.  If endobronchial biopsy is nondiagnostic, CT guided biopsy could be considered.  2.  Severe centrilobular pulmonary emphysema.  3.  Atherosclerosis including coronary artery disease  4.   Small 4 mm low attenuation lesion in the liver is nonspecific and warrants attention on follow-up imaging.   Original Report Authenticated By: Malachy Moan, M.D.   Nm Pet Image Restag (ps) Skull Base To Thigh  02/24/2013   CLINICAL DATA:  Subsequent treatment strategy for lung carcinoma. New  left lung mass.  EXAM: NUCLEAR MEDICINE PET SKULL BASE TO THIGH  FASTING BLOOD GLUCOSE:  Value:  68 mg/dl  TECHNIQUE: 16.1 mCi W-96 FDG was injected intravenously. CT data was obtained and used for attenuation correction and anatomic localization only. (This was not acquired as a diagnostic CT examination.) Additional exam technical data entered on technologist worksheet.  COMPARISON:  Chest CT on 02/14/2013 and prior PET-CT on 01/17/2010  FINDINGS: NECK  No hypermetabolic lymph nodes in the neck.  CHEST  A large mass is seen in the central left lower lobe which is contiguous with the left hilum and encases the left pulmonary artery. This mass has a maximum SUV 13.9. There is also hypermetabolic mediastinal lymphadenopathy on the left adjacent to the pulmonary artery and its and lateral aortic region as well as in the subcarinal region.  No hypermetabolic activity seen in the right hilum. CT images show a pulmonary emphysema, but no other suspicious pulmonary nodules or masses are identified. Postop changes are noted from prior right thoracotomy.  ABDOMEN/PELVIS  No abnormal hypermetabolic activity within the liver, pancreas, adrenal glands, or spleen. No hypermetabolic lymph nodes in the abdomen or pelvis.  SKELETON  No focal hypermetabolic activity to suggest skeletal metastasis.  IMPRESSION: Large hypermetabolic mass in the central left lower lobe, which involves the left hilum and encases the left pulmonary artery, consistent with primary bronchogenic carcinoma.  Metastatic mediastinal lymphadenopathy adjacent to left pulmonary artery, and in lateral aortic, and subcarinal regions.  No evidence of extrathoracic  metastatic disease.   Electronically Signed   By: [name] M.D.   On: 02/24/2013 14:29    ASSESSMENT: This is a very pleasant 57 years old white male recently diagnosed with small cell lung cancer, limited stage disease pending MRI of the brain to complete the staging workup. He presented with large left upper lobe central lung mass in addition to mediastinal lymphadenopathy diagnosed in October of 2014.   PLAN: I had a lengthy discussion with the patient and his sister about his disease stage, prognosis and treatment options. I explained to the patient that if he has no evidence for metastatic disease to the brain, the final staging for his disease would be limited stage and the five-year survival is around 25% this treatment. I recommended for the patient treatment with carboplatin for AUC of 5 on day 1 and etoposide 120 mg/M2 on days 1, 2 and 3 with Neulasta support on day 4 every 3 weeks. I do think the patient would be a good candidate for treatment with cisplatin because of his diabetes mellitus and chronic kidney disease. I discussed with the patient the adverse effect of the chemotherapy including but not limited to alopecia, myelosuppression, nausea and vomiting, peripheral neuropathy, liver or renal dysfunction. His chemotherapy will be concurrent with radiotherapy and the patient was seen by Dr. Michell Heinrich for discussion of this option.  The patient would like to proceed with the chemotherapy as soon as possible. I scheduled him for the first cycle of the treatment on 03/09/2013.  He would have a chemotherapy education class tomorrow before starting the first cycle of the chemotherapy. I will call his pharmacy with prescription for Compazine 10 mg by mouth every 6 hours as needed for nausea. The patient would come back for followup visit in one week for evaluation and management any adverse effect of his chemotherapy. He was advised to call immediately if he has any concerning symptoms in  the interval. The patient voices understanding of  current disease status and treatment options and is in agreement with the current care plan.  All questions were answered. The patient knows to call the clinic with any problems, questions or concerns. We can certainly see the patient much sooner if necessary.  Thank you so much for allowing me to participate in the care of [name]. I will continue to follow up the patient with you and assist in his care.  I spent 55 minutes counseling the patient face to face. The total time spent in the appointment was 70 minutes.  Emalia Witkop K. 03/07/2013, 3:34 PM

## 2013-03-07 NOTE — Patient Instructions (Signed)
You are recently diagnosed with small cell lung cancer. We discussed treatment options including chemotherapy with carboplatin and etoposide. First cycle he scheduled 03/09/2013. Followup visit in one week

## 2013-03-07 NOTE — Addendum Note (Signed)
Encounter addended by: Tessa Lerner, RN on: 03/07/2013 10:08 AM<BR>     Documentation filed: Charges VN

## 2013-03-07 NOTE — Progress Notes (Signed)
Checked in new pt with no financial concerns. °

## 2013-03-07 NOTE — Telephone Encounter (Signed)
gv and printed appt sched and avs for pt for OCT and NOV....sed added tx. °

## 2013-03-08 ENCOUNTER — Encounter: Payer: Self-pay | Admitting: *Deleted

## 2013-03-08 ENCOUNTER — Ambulatory Visit
Admission: RE | Admit: 2013-03-08 | Discharge: 2013-03-08 | Disposition: A | Discharge status: Home / Self Care | Payer: BC Managed Care – PPO | Source: Ambulatory Visit | Attending: Radiation Oncology | Admitting: Radiation Oncology

## 2013-03-08 ENCOUNTER — Other Ambulatory Visit: Payer: BC Managed Care – PPO

## 2013-03-08 ENCOUNTER — Telehealth: Payer: Self-pay | Admitting: Medical Oncology

## 2013-03-08 DIAGNOSIS — R066 Hiccough: Secondary | ICD-10-CM | POA: Insufficient documentation

## 2013-03-08 DIAGNOSIS — Y842 Radiological procedure and radiotherapy as the cause of abnormal reaction of the patient, or of later complication, without mention of misadventure at the time of the procedure: Secondary | ICD-10-CM | POA: Insufficient documentation

## 2013-03-08 DIAGNOSIS — L589 Radiodermatitis, unspecified: Secondary | ICD-10-CM | POA: Insufficient documentation

## 2013-03-08 DIAGNOSIS — Z510 Encounter for antineoplastic radiation therapy: Secondary | ICD-10-CM | POA: Insufficient documentation

## 2013-03-08 DIAGNOSIS — Z79899 Other long term (current) drug therapy: Secondary | ICD-10-CM | POA: Insufficient documentation

## 2013-03-08 DIAGNOSIS — R1013 Epigastric pain: Secondary | ICD-10-CM | POA: Insufficient documentation

## 2013-03-08 DIAGNOSIS — R42 Dizziness and giddiness: Secondary | ICD-10-CM | POA: Insufficient documentation

## 2013-03-08 DIAGNOSIS — R5381 Other malaise: Secondary | ICD-10-CM | POA: Insufficient documentation

## 2013-03-08 DIAGNOSIS — M25519 Pain in unspecified shoulder: Secondary | ICD-10-CM | POA: Insufficient documentation

## 2013-03-08 DIAGNOSIS — C3490 Malignant neoplasm of unspecified part of unspecified bronchus or lung: Secondary | ICD-10-CM | POA: Insufficient documentation

## 2013-03-08 DIAGNOSIS — K3189 Other diseases of stomach and duodenum: Secondary | ICD-10-CM | POA: Insufficient documentation

## 2013-03-08 DIAGNOSIS — R112 Nausea with vomiting, unspecified: Secondary | ICD-10-CM | POA: Insufficient documentation

## 2013-03-08 DIAGNOSIS — R1310 Dysphagia, unspecified: Secondary | ICD-10-CM | POA: Insufficient documentation

## 2013-03-08 MED ORDER — PROCHLORPERAZINE MALEATE 10 MG PO TABS: 10.0000 mg | ORAL_TABLET | Freq: Four times a day (QID) | ORAL | Status: DC | PRN

## 2013-03-08 NOTE — Progress Notes (Signed)
[facility] Radiation Oncology Simulation and Treatment Planning Note   Name: [name] MRN: [id number]  Date: 03/08/2013  DOB: 1956/02/11  Status: outpatient    DIAGNOSIS: Limited stage small cell lung cancer    SIDE: left   CONSENT VERIFIED: yes   SET UP AND IMMOBILIZATION: Patient is setup supine with arms in a wing board.   NARRATIVE: The patient was brought to the CT Simulation planning suite.  Identity was confirmed.  All relevant records and images related to the planned course of therapy were reviewed.  Then, the patient was positioned in a stable reproducible clinical set-up for radiation therapy.  CT images were obtained.  Skin markings were placed.  The CT images were loaded into the planning software where the target and avoidance structures were contoured.  The radiation prescription was entered and confirmed.   TREATMENT PLANNING NOTE:  Treatment planning then occurred. I have requested 3D simulation with [facility] of the spinal cord, total lungs and gross tumor volume. I have also requested mlcs and an isodose plan.   Special treatment procedure will be performed as [name] will be receiving concurrent chemotherapy.   I have ordered a consult with the dietician for monitoring.  I will also be verifying that weekly lab values are appropriate.

## 2013-03-08 NOTE — Telephone Encounter (Signed)
Called in compazine to pharmacy and pt

## 2013-03-09 ENCOUNTER — Encounter: Payer: Self-pay | Admitting: Internal Medicine

## 2013-03-09 ENCOUNTER — Ambulatory Visit (HOSPITAL_BASED_OUTPATIENT_CLINIC_OR_DEPARTMENT_OTHER): Payer: BC Managed Care – PPO

## 2013-03-09 VITALS — BP 96/68 | HR 105 | Temp 98.1°F | Resp 20

## 2013-03-09 DIAGNOSIS — Z5111 Encounter for antineoplastic chemotherapy: Secondary | ICD-10-CM

## 2013-03-09 DIAGNOSIS — C3492 Malignant neoplasm of unspecified part of left bronchus or lung: Secondary | ICD-10-CM

## 2013-03-09 DIAGNOSIS — C3410 Malignant neoplasm of upper lobe, unspecified bronchus or lung: Secondary | ICD-10-CM

## 2013-03-09 MED ORDER — SODIUM CHLORIDE 0.9 % IV SOLN
120.0000 mg/m2 | Freq: Once | INTRAVENOUS | Status: AC
  Administered 2013-03-09: 270 mg via INTRAVENOUS
  Filled 2013-03-09: qty 13.5

## 2013-03-09 MED ORDER — DEXAMETHASONE SODIUM PHOSPHATE 20 MG/5ML IJ SOLN
INTRAMUSCULAR | Status: AC
  Filled 2013-03-09: qty 5

## 2013-03-09 MED ORDER — SODIUM CHLORIDE 0.9 % IV SOLN
Freq: Once | INTRAVENOUS | Status: AC
  Administered 2013-03-09: 14:00:00 via INTRAVENOUS

## 2013-03-09 MED ORDER — DEXAMETHASONE SODIUM PHOSPHATE 20 MG/5ML IJ SOLN
20.0000 mg | Freq: Once | INTRAMUSCULAR | Status: AC
  Administered 2013-03-09: 20 mg via INTRAVENOUS

## 2013-03-09 MED ORDER — SODIUM CHLORIDE 0.9 % IV SOLN
533.5000 mg | Freq: Once | INTRAVENOUS | Status: AC
  Administered 2013-03-09: 530 mg via INTRAVENOUS
  Filled 2013-03-09: qty 53

## 2013-03-09 MED ORDER — ONDANSETRON 16 MG/50ML IVPB (CHCC)
INTRAVENOUS | Status: AC
  Filled 2013-03-09: qty 16

## 2013-03-09 MED ORDER — ONDANSETRON 16 MG/50ML IVPB (CHCC)
16.0000 mg | Freq: Once | INTRAVENOUS | Status: AC
  Administered 2013-03-09: 16 mg via INTRAVENOUS

## 2013-03-09 NOTE — Progress Notes (Signed)
Enrolled pt in the Neulasta First Step program.  I faxed signed form and activated card today.  °

## 2013-03-09 NOTE — Patient Instructions (Signed)
[facility] Discharge Instructions for Patients Receiving Chemotherapy  Today you received the following chemotherapy agents: Carboplatin and Etoposide   To help prevent nausea and vomiting after your treatment, we encourage you to take your nausea medication as prescribed.    If you develop nausea and vomiting that is not controlled by your nausea medication, call the clinic.   BELOW ARE SYMPTOMS THAT SHOULD BE REPORTED IMMEDIATELY:  *FEVER GREATER THAN 100.5 F  *CHILLS WITH OR WITHOUT FEVER  NAUSEA AND VOMITING THAT IS NOT CONTROLLED WITH YOUR NAUSEA MEDICATION  *UNUSUAL SHORTNESS OF BREATH  *UNUSUAL BRUISING OR BLEEDING  TENDERNESS IN MOUTH AND THROAT WITH OR WITHOUT PRESENCE OF ULCERS  *URINARY PROBLEMS  *BOWEL PROBLEMS  UNUSUAL RASH Items with * indicate a potential emergency and should be followed up as soon as possible.  Feel free to call the clinic you have any questions or concerns. The clinic phone number is [phone].    

## 2013-03-10 ENCOUNTER — Ambulatory Visit (HOSPITAL_BASED_OUTPATIENT_CLINIC_OR_DEPARTMENT_OTHER): Payer: BC Managed Care – PPO

## 2013-03-10 ENCOUNTER — Ambulatory Visit: Payer: BC Managed Care – PPO | Admitting: Nutrition

## 2013-03-10 VITALS — BP 121/94 | HR 78 | Temp 97.8°F

## 2013-03-10 DIAGNOSIS — C3492 Malignant neoplasm of unspecified part of left bronchus or lung: Secondary | ICD-10-CM

## 2013-03-10 DIAGNOSIS — Z5111 Encounter for antineoplastic chemotherapy: Secondary | ICD-10-CM

## 2013-03-10 DIAGNOSIS — C3410 Malignant neoplasm of upper lobe, unspecified bronchus or lung: Secondary | ICD-10-CM

## 2013-03-10 MED ORDER — ONDANSETRON 8 MG/NS 50 ML IVPB
INTRAVENOUS | Status: AC
  Filled 2013-03-10: qty 8

## 2013-03-10 MED ORDER — ONDANSETRON 8 MG/50ML IVPB (CHCC)
8.0000 mg | Freq: Once | INTRAVENOUS | Status: AC
  Administered 2013-03-10: 8 mg via INTRAVENOUS

## 2013-03-10 MED ORDER — DEXAMETHASONE SODIUM PHOSPHATE 10 MG/ML IJ SOLN
10.0000 mg | Freq: Once | INTRAMUSCULAR | Status: AC
  Administered 2013-03-10: 10 mg via INTRAVENOUS

## 2013-03-10 MED ORDER — SODIUM CHLORIDE 0.9 % IV SOLN
120.0000 mg/m2 | Freq: Once | INTRAVENOUS | Status: AC
  Administered 2013-03-10: 270 mg via INTRAVENOUS
  Filled 2013-03-10: qty 13.5

## 2013-03-10 MED ORDER — DEXAMETHASONE SODIUM PHOSPHATE 10 MG/ML IJ SOLN
INTRAMUSCULAR | Status: AC
  Filled 2013-03-10: qty 1

## 2013-03-10 MED ORDER — SODIUM CHLORIDE 0.9 % IV SOLN
Freq: Once | INTRAVENOUS | Status: AC
  Administered 2013-03-10: 14:00:00 via INTRAVENOUS

## 2013-03-10 NOTE — Patient Instructions (Signed)
[facility] Discharge Instructions for Patients Receiving Chemotherapy  Today you received the following chemotherapy agents: Etoposide.  To help prevent nausea and vomiting after your treatment, we encourage you to take your nausea medication as prescribed.   If you develop nausea and vomiting that is not controlled by your nausea medication, call the clinic.   BELOW ARE SYMPTOMS THAT SHOULD BE REPORTED IMMEDIATELY:  *FEVER GREATER THAN 100.5 F  *CHILLS WITH OR WITHOUT FEVER  NAUSEA AND VOMITING THAT IS NOT CONTROLLED WITH YOUR NAUSEA MEDICATION  *UNUSUAL SHORTNESS OF BREATH  *UNUSUAL BRUISING OR BLEEDING  TENDERNESS IN MOUTH AND THROAT WITH OR WITHOUT PRESENCE OF ULCERS  *URINARY PROBLEMS  *BOWEL PROBLEMS  UNUSUAL RASH Items with * indicate a potential emergency and should be followed up as soon as possible.  Feel free to call the clinic you have any questions or concerns. The clinic phone number is [phone].    

## 2013-03-10 NOTE — Progress Notes (Signed)
Patient is a 57 year old male diagnosed with small cell lung cancer.  He is a patient of Dr. Shirline Frees.  Past medical history includes CAD, diabetes, hyperlipidemia, COPD, chronic kidney disease, obesity, CVA, CHF, MI, hypertension, asthma, and stroke.  Medications include Glucotrol, Lipitor, Glucophage XR, and Compazine.  Labs include BUN 26.1, creatinine 1.5, and albumin 3.3.  Height: 5 feet 10 inches. Weight: 232.7 pounds on October 27. Usual body weight: 242 pounds May 2013. BMI: 33.39.  Patient denies nutrition side effects at this time.  He has lost approximately 10 pounds over the last 5 months.  Patient appears to consume a typical diet.  Nutrition diagnosis: Food and nutrition related knowledge deficit related to diagnosis of lung cancer and associated treatments as evidenced by no prior need for nutrition related information.  Intervention: Patient and sister were educated on the importance of smaller, more frequent meals and snacks throughout the day to promote weight maintenance.  I have reviewed high-protein foods with patient.  I briefly educated him on strategies for eating with nausea and constipation.  I also educated him on the importance of adequate fluid intake.  I have reviewed.  Soft diet for esophagitis.  All questions were answered.  My contact information was provided.  I've given him fact sheets.  Monitoring, evaluation, goals: Patient will tolerate diet to promote minimal weight loss with stable renal function.  Next visit: Thursday, November 20, during chemotherapy.

## 2013-03-11 ENCOUNTER — Encounter: Payer: Self-pay | Admitting: *Deleted

## 2013-03-11 ENCOUNTER — Ambulatory Visit (HOSPITAL_BASED_OUTPATIENT_CLINIC_OR_DEPARTMENT_OTHER): Payer: BC Managed Care – PPO

## 2013-03-11 VITALS — BP 116/71 | HR 74 | Temp 98.9°F | Resp 21

## 2013-03-11 DIAGNOSIS — C3492 Malignant neoplasm of unspecified part of left bronchus or lung: Secondary | ICD-10-CM

## 2013-03-11 DIAGNOSIS — C3410 Malignant neoplasm of upper lobe, unspecified bronchus or lung: Secondary | ICD-10-CM

## 2013-03-11 DIAGNOSIS — Z5111 Encounter for antineoplastic chemotherapy: Secondary | ICD-10-CM

## 2013-03-11 MED ORDER — ONDANSETRON 8 MG/NS 50 ML IVPB
INTRAVENOUS | Status: AC
  Filled 2013-03-11: qty 8

## 2013-03-11 MED ORDER — ONDANSETRON 8 MG/50ML IVPB (CHCC)
8.0000 mg | Freq: Once | INTRAVENOUS | Status: AC
  Administered 2013-03-11: 8 mg via INTRAVENOUS

## 2013-03-11 MED ORDER — DEXAMETHASONE SODIUM PHOSPHATE 10 MG/ML IJ SOLN
10.0000 mg | Freq: Once | INTRAMUSCULAR | Status: AC
  Administered 2013-03-11: 10 mg via INTRAVENOUS

## 2013-03-11 MED ORDER — SODIUM CHLORIDE 0.9 % IV SOLN
Freq: Once | INTRAVENOUS | Status: AC
  Administered 2013-03-11: 14:00:00 via INTRAVENOUS

## 2013-03-11 MED ORDER — SODIUM CHLORIDE 0.9 % IV SOLN
120.0000 mg/m2 | Freq: Once | INTRAVENOUS | Status: AC
  Administered 2013-03-11: 270 mg via INTRAVENOUS
  Filled 2013-03-11: qty 13.5

## 2013-03-11 MED ORDER — DEXAMETHASONE SODIUM PHOSPHATE 10 MG/ML IJ SOLN
INTRAMUSCULAR | Status: AC
  Filled 2013-03-11: qty 1

## 2013-03-11 NOTE — Progress Notes (Signed)
[facility] Psychosocial Distress Screening Clinical Social Work  Clinical Social Work was referred by distress screening protocol.  The patient scored a 5 on the Psychosocial Distress Thermometer which indicates moderate distress. Clinical Social Worker phoned Pt at home to assess for distress and other psychosocial needs. Pt's phone went straight to vm, CSW left message that CSW's were available as needed to assist and to call as needed.    Clinical Social Worker follow up needed: no  Doreen Salvage, LCSW Clinical Social Worker [facility] Patient [facility] Family Support [facility] Wednesday, Thursday and Friday Phone: [phone] Fax: [phone]

## 2013-03-11 NOTE — Patient Instructions (Signed)
[facility] Discharge Instructions for Patients Receiving Chemotherapy  Today you received the following chemotherapy agent Etoposide (VP-16).  To help prevent nausea and vomiting after your treatment, we encourage you to take your nausea medication.   If you develop nausea and vomiting that is not controlled by your nausea medication, call the clinic.   BELOW ARE SYMPTOMS THAT SHOULD BE REPORTED IMMEDIATELY:  *FEVER GREATER THAN 100.5 F  *CHILLS WITH OR WITHOUT FEVER  NAUSEA AND VOMITING THAT IS NOT CONTROLLED WITH YOUR NAUSEA MEDICATION  *UNUSUAL SHORTNESS OF BREATH  *UNUSUAL BRUISING OR BLEEDING  TENDERNESS IN MOUTH AND THROAT WITH OR WITHOUT PRESENCE OF ULCERS  *URINARY PROBLEMS  *BOWEL PROBLEMS  UNUSUAL RASH Items with * indicate a potential emergency and should be followed up as soon as possible.  Feel free to call the clinic you have any questions or concerns. The clinic phone number is [phone].    

## 2013-03-12 ENCOUNTER — Ambulatory Visit (HOSPITAL_BASED_OUTPATIENT_CLINIC_OR_DEPARTMENT_OTHER): Payer: BC Managed Care – PPO

## 2013-03-12 VITALS — BP 117/73 | HR 74 | Temp 97.8°F | Resp 22

## 2013-03-12 DIAGNOSIS — Z5189 Encounter for other specified aftercare: Secondary | ICD-10-CM

## 2013-03-12 DIAGNOSIS — C3492 Malignant neoplasm of unspecified part of left bronchus or lung: Secondary | ICD-10-CM

## 2013-03-12 DIAGNOSIS — C3410 Malignant neoplasm of upper lobe, unspecified bronchus or lung: Secondary | ICD-10-CM

## 2013-03-12 MED ORDER — PEGFILGRASTIM INJECTION 6 MG/0.6ML
6.0000 mg | Freq: Once | SUBCUTANEOUS | Status: AC
  Administered 2013-03-12: 6 mg via SUBCUTANEOUS

## 2013-03-14 ENCOUNTER — Ambulatory Visit (HOSPITAL_COMMUNITY)
Admission: RE | Admit: 2013-03-14 | Discharge: 2013-03-14 | Disposition: A | Discharge status: Home / Self Care | Payer: BC Managed Care – PPO | Source: Ambulatory Visit | Attending: Radiation Oncology | Admitting: Radiation Oncology

## 2013-03-14 ENCOUNTER — Other Ambulatory Visit: Payer: Self-pay | Admitting: Radiation Oncology

## 2013-03-14 ENCOUNTER — Telehealth: Payer: Self-pay | Admitting: *Deleted

## 2013-03-14 ENCOUNTER — Ambulatory Visit: Payer: BC Managed Care – PPO | Admitting: Internal Medicine

## 2013-03-14 DIAGNOSIS — I6789 Other cerebrovascular disease: Secondary | ICD-10-CM | POA: Insufficient documentation

## 2013-03-14 DIAGNOSIS — C3492 Malignant neoplasm of unspecified part of left bronchus or lung: Secondary | ICD-10-CM

## 2013-03-14 DIAGNOSIS — C3490 Malignant neoplasm of unspecified part of unspecified bronchus or lung: Secondary | ICD-10-CM | POA: Insufficient documentation

## 2013-03-14 MED ORDER — GADOBENATE DIMEGLUMINE 529 MG/ML IV SOLN
20.0000 mL | Freq: Once | INTRAVENOUS | Status: AC | PRN
  Administered 2013-03-14: 20 mL via INTRAVENOUS

## 2013-03-14 NOTE — Telephone Encounter (Signed)
Called [name] at home and he states that he is doing well.  No nausea, vomiting, or diarrhea.  Is drinking and eating well.  Knows to call if he has any problems or concerns.

## 2013-03-15 ENCOUNTER — Telehealth: Payer: Self-pay

## 2013-03-15 ENCOUNTER — Ambulatory Visit
Admission: RE | Admit: 2013-03-15 | Payer: BC Managed Care – PPO | Source: Ambulatory Visit | Admitting: Radiation Oncology

## 2013-03-15 NOTE — Telephone Encounter (Signed)
Patient informed that mri of brain performed 03/14/13 negative for mets.

## 2013-03-16 ENCOUNTER — Ambulatory Visit
Admission: RE | Admit: 2013-03-16 | Discharge: 2013-03-16 | Disposition: A | Discharge status: Home / Self Care | Payer: BC Managed Care – PPO | Source: Ambulatory Visit | Attending: Radiation Oncology | Admitting: Radiation Oncology

## 2013-03-16 ENCOUNTER — Encounter (INDEPENDENT_AMBULATORY_CARE_PROVIDER_SITE_OTHER): Payer: Self-pay

## 2013-03-16 ENCOUNTER — Other Ambulatory Visit (HOSPITAL_BASED_OUTPATIENT_CLINIC_OR_DEPARTMENT_OTHER): Payer: BC Managed Care – PPO | Admitting: Lab

## 2013-03-16 ENCOUNTER — Encounter: Payer: Self-pay | Admitting: Physician Assistant

## 2013-03-16 ENCOUNTER — Ambulatory Visit (HOSPITAL_BASED_OUTPATIENT_CLINIC_OR_DEPARTMENT_OTHER): Payer: BC Managed Care – PPO | Admitting: Physician Assistant

## 2013-03-16 ENCOUNTER — Telehealth: Payer: Self-pay | Admitting: Internal Medicine

## 2013-03-16 VITALS — BP 104/66 | HR 75 | Temp 97.9°F | Resp 18 | Ht 70.0 in | Wt 230.5 lb

## 2013-03-16 DIAGNOSIS — C3410 Malignant neoplasm of upper lobe, unspecified bronchus or lung: Secondary | ICD-10-CM

## 2013-03-16 DIAGNOSIS — C3492 Malignant neoplasm of unspecified part of left bronchus or lung: Secondary | ICD-10-CM

## 2013-03-16 DIAGNOSIS — C3430 Malignant neoplasm of lower lobe, unspecified bronchus or lung: Secondary | ICD-10-CM

## 2013-03-16 DIAGNOSIS — C3490 Malignant neoplasm of unspecified part of unspecified bronchus or lung: Secondary | ICD-10-CM

## 2013-03-16 DIAGNOSIS — D709 Neutropenia, unspecified: Secondary | ICD-10-CM

## 2013-03-16 LAB — CBC WITH DIFFERENTIAL/PLATELET
BASO%: 1 % (ref 0.0–2.0)
EOS%: 11.5 % — ABNORMAL HIGH (ref 0.0–7.0)
Eosinophils Absolute: 0.1 10*3/uL (ref 0.0–0.5)
HCT: 39.1 % (ref 38.4–49.9)
MCH: 31.9 pg (ref 27.2–33.4)
MCHC: 34 g/dL (ref 32.0–36.0)
MCV: 93.8 fL (ref 79.3–98.0)
MONO#: 0 10*3/uL — ABNORMAL LOW (ref 0.1–0.9)
NEUT%: 36.5 % — ABNORMAL LOW (ref 39.0–75.0)
RDW: 13.1 % (ref 11.0–14.6)
WBC: 1 10*3/uL — ABNORMAL LOW (ref 4.0–10.3)
lymph#: 0.5 10*3/uL — ABNORMAL LOW (ref 0.9–3.3)

## 2013-03-16 LAB — COMPREHENSIVE METABOLIC PANEL (CC13)
ALT: 17 U/L (ref 0–55)
AST: 11 U/L (ref 5–34)
Albumin: 3 g/dL — ABNORMAL LOW (ref 3.5–5.0)
Anion Gap: 9 mEq/L (ref 3–11)
CO2: 24 mEq/L (ref 22–29)
Calcium: 9.3 mg/dL (ref 8.4–10.4)
Chloride: 100 mEq/L (ref 98–109)
Creatinine: 1.4 mg/dL — ABNORMAL HIGH (ref 0.7–1.3)
Potassium: 4.7 mEq/L (ref 3.5–5.1)
Sodium: 134 mEq/L — ABNORMAL LOW (ref 136–145)
Total Protein: 6.3 g/dL — ABNORMAL LOW (ref 6.4–8.3)

## 2013-03-16 NOTE — Progress Notes (Signed)
  Radiation Oncology         [phone]) [phone] ________________________________  Name: [name] MRN: [id number]  Date: 03/16/2013  DOB: [birth year]  Simulation Verification Note  Status: outpatient  NARRATIVE: The patient was brought to the treatment unit and placed in the planned treatment position. The clinical setup was verified. Then port films were obtained and uploaded to the radiation oncology medical record software.  The treatment beams were carefully compared against the planned radiation fields. The position location and shape of the radiation fields was reviewed. They targeted volume of tissue appears to be appropriately covered by the radiation beams. Organs at risk appear to be excluded as planned.  Based on my personal review, I approved the simulation verification. The patient's treatment will proceed as planned.  -----------------------------------  Billie Lade, PhD, MD

## 2013-03-16 NOTE — Telephone Encounter (Signed)
GV ADN PRINTED APTP SCHED AND AVS FORPT FOR nov

## 2013-03-16 NOTE — Progress Notes (Addendum)
No images are attached to the encounter. No scans are attached to the encounter. No scans are attached to the encounter. [facility] SHARED VISIT PROGRESS NOTE  Clydell Hakim, MD [address] [place] [address]  DIAGNOSIS: Small cell carcinoma of lung   Primary site: Lung (Left)   Staging method: AJCC 7th Edition   Clinical: Stage IIIA (T2a, N2, M0)   Summary: Stage IIIA (T2a, N2, M0) Malignant neoplasm of lower lobe of left lung   Primary site: Lung (Left)   Staging method: AJCC 7th Edition   Summary: Incomplete stage    PRIOR THERAPY: None  CURRENT THERAPY: Systemic chemotherapy with carboplatin for an AUC of 5 given on day 1 and etoposide at 120 mg per meter squared given on days 1, 2 and 3 with Neulasta support given on day 4 given every 3 weeks. 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   Primary site: Lung (Left)   Staging method: AJCC 7th Edition   Clinical: Stage IIIA (T2a, N2, M0)   Summary: Stage IIIA (T2a, N2, M0) Malignant neoplasm of lower lobe of left lung   Primary site: Lung (Left)   Staging method: AJCC 7th Edition   Summary: Incomplete stage  CHEMOTHERAPY INTENT: Palliative  CURRENT # OF CHEMOTHERAPY CYCLES: 1  CURRENT ANTIEMETICS: Zofran, dexamethasone and Compazine  CURRENT SMOKING STATUS: Former smoker, quit 09/09/2009  ORAL CHEMOTHERAPY AND CONSENT: N./A  CURRENT BISPHOSPHONATES USE: None  PAIN MANAGEMENT: None  NARCOTICS INDUCED CONSTIPATION: None  LIVING WILL AND CODE STATUS: ?   INTERVAL HISTORY: [name] 57 y.o. male returns for a scheduled regular symptom management visit for followup of his recently diagnosed limited stage small cell lung cancer. He is status post one cycle of systemic chemotherapy with carboplatin and etoposide with Neulasta support. Overall he tolerated his first cycle of chemotherapy relatively well with the  exception of fatigue. He does complain of some decreased taste although his appetite remains good. He occasionally gets short of breath with exertion and continues on oxygen at 2 L per minute primarily when he exerts himself, otherwise has no specific complaints.  MEDICAL HISTORY: Past Medical History  Diagnosis Date  . CAD (coronary artery disease)     a. PCI 3/08 to OM2 and mid CFX; b.  NSTEMI 6/10:  Prior stents patent, 90% dCFX,  mRCA occl (old) with collats; EF 55% on LV-gram => Xience DES 2.5 x 23 mm to distal CFX;  c. abnl MV 12/13 => LHC 04/23/12:  pLAD 50%, oOM1 80%, long stented segment of the CFX extending into the PLOM with the PLOM totally occluded ostially, CFX 70% ISR, mCFX 80% ISR, pRCA chronically occluded. Med Rx planned  . Diabetes mellitus type II   . Hyperlipidemia   . COPD (chronic obstructive pulmonary disease)     Quit tobacco 09/2009; Gold Stage 2 with asthma component - FEV1 1.53 L/54%, 14% fev1 BD response, DLCO 54% - July 2011; MM genotype 01/07/10; unable to afford spiriva and unwilling to try ics due to prior renal failure: state to Dr. Marchelle Gearing, Aug 2011; started on atrovent HFA fall 2011. no desturation on walk test Dec 2011  . CKD (chronic kidney disease)   . Obesity   . History of CVA (cerebrovascular accident) 2007    Without residual defecits  . 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 no further rx  . CHF (congestive heart failure)   . Myocardial infarction     x3  . Hypertension   . Asthma     as child  . Shortness of breath     constant/wears 2 liters  . Pneumonia     02/14/2013 in hospital  . Stroke     7 years- no problems   . Cancer     lung  cancer-02/14/2013    ALLERGIES:  is allergic to erythromycin and penicillins.  MEDICATIONS:  Current Outpatient Prescriptions  Medication Sig Dispense Refill  . albuterol (PROAIR HFA) 108 (90 BASE) MCG/ACT inhaler Inhale 2 puffs into the lungs every 6 (six) hours as needed for wheezing or shortness of breath.      Marland Kitchen amLODipine (NORVASC) 2.5 MG tablet Take 2.5 mg by mouth daily.      Marland Kitchen aspirin EC 81 MG tablet Take 81 mg by mouth daily.      Marland Kitchen atorvastatin (LIPITOR) 40 MG tablet Take 40 mg by mouth at bedtime.      . bisoprolol (ZEBETA) 5 MG tablet Take 5 mg by mouth daily.      . clopidogrel (PLAVIX) 75 MG tablet Take 1 tablet (75 mg total) by mouth daily. Resume once cleared by the lung doctor after bronchoscopy and lung biopsy      . Fluticasone Furoate-Vilanterol (BREO ELLIPTA) 100-25 MCG/INH AEPB Inhale 1 puff into the lungs 2 (two) times daily.  28 each  1  . glipiZIDE (GLUCOTROL) 10 MG tablet Take 10 mg by mouth 2 (two) times daily before a meal.       . isosorbide mononitrate (IMDUR) 60 MG 24 hr tablet Take 1 tablet (60 mg total) by mouth daily.  30 tablet  8  . losartan (COZAAR) 50 MG tablet Take 1 tablet (50 mg total) by mouth daily.  30 tablet  8  . metFORMIN (GLUCOPHAGE-XR) 750 MG 24 hr tablet Take 1,500 mg by mouth daily with breakfast.      . nitroGLYCERIN (NITROSTAT) 0.4 MG SL tablet Place 0.4 mg under the tongue every 5 (five) minutes as needed for chest pain. May repeat up to 3 doses      . prochlorperazine (COMPAZINE) 10 MG tablet Take 1 tablet (10 mg total) by mouth every 6 (six) hours as needed.  30 tablet  0  . albuterol (PROVENTIL) (2.5 MG/3ML) 0.083% nebulizer solution Take 3 mLs (2.5 mg total) by nebulization 5 (five) times daily as needed for wheezing or shortness of breath.  75 mL  12   No current facility-administered medications for this visit.    SURGICAL HISTORY:  Past Surgical History  Procedure Laterality Date  . Wedge resection  03/28/2010  . Coronary  stent placement  2009  . Cardiac catheterization  2009  . Endobronchial ultrasound Bilateral 02/28/2013    Procedure: ENDOBRONCHIAL ULTRASOUND;  Surgeon: Kalman Shan, MD;  Location: [facility];  Service: Cardiopulmonary;  Laterality: Bilateral;    REVIEW OF SYSTEMS:  Constitutional: positive for fatigue Eyes: negative Ears, nose, mouth, throat, and face: negative Respiratory: positive for dyspnea on exertion Cardiovascular: negative Gastrointestinal: negative Genitourinary:negative Integument/breast: negative Hematologic/lymphatic: negative Musculoskeletal:negative Neurological: negative Behavioral/Psych: negative Endocrine: negative Allergic/Immunologic: negative   PHYSICAL EXAMINATION: General appearance: alert, cooperative, appears  stated age and no distress Head: Normocephalic, without obvious abnormality, atraumatic Neck: no adenopathy, no carotid bruit, no JVD, supple, symmetrical, trachea midline and thyroid not enlarged, symmetric, no tenderness/mass/nodules Lymph nodes: Cervical, supraclavicular, and axillary nodes normal. Resp: clear to auscultation bilaterally Cardio: regular rate and rhythm, S1, S2 normal, no murmur, click, rub or gallop GI: soft, non-tender; bowel sounds normal; no masses,  no organomegaly Extremities: extremities normal, atraumatic, no cyanosis or edema  ECOG PERFORMANCE STATUS: 1 - Symptomatic but completely ambulatory  Blood pressure 104/66, pulse 75, temperature 97.9 F (36.6 C), temperature source Oral, resp. rate 18, height 5\' 10"  (1.778 m), weight 230 lb 8 oz (104.554 kg), SpO2 100.00%.  LABORATORY DATA: Lab Results  Component Value Date   WBC 1.0* 03/16/2013   HGB 13.3 03/16/2013   HCT 39.1 03/16/2013   MCV 93.8 03/16/2013   PLT 65* 03/16/2013      Chemistry      Component Value Date/Time   NA 134* 03/16/2013 1030   NA 133* 02/16/2013 0600   K 4.7 03/16/2013 1030   K 4.5 02/16/2013 0600   CL 97 02/16/2013 0600   CO2 24 03/16/2013  1030   CO2 24 02/16/2013 0600   BUN 30.5* 03/16/2013 1030   BUN 30* 02/16/2013 0600   CREATININE 1.4* 03/16/2013 1030   CREATININE 1.49* 02/16/2013 0600   CREATININE 1.46* 12/10/2010 0843      Component Value Date/Time   CALCIUM 9.3 03/16/2013 1030   CALCIUM 9.1 02/16/2013 0600   ALKPHOS 92 03/16/2013 1030   ALKPHOS 86 02/16/2013 0600   AST 11 03/16/2013 1030   AST 13 02/16/2013 0600   ALT 17 03/16/2013 1030   ALT 18 02/16/2013 0600   BILITOT 1.15 03/16/2013 1030   BILITOT 0.3 02/16/2013 0600       RADIOGRAPHIC STUDIES:  Mr Laqueta Jean Wo Contrast  03/14/2013   CLINICAL DATA:  New diagnosis small cell lung cancer. Staging.  EXAM: MRI HEAD WITHOUT AND WITH CONTRAST  TECHNIQUE: Multiplanar, multiecho pulse sequences of the brain and surrounding structures were obtained according to standard protocol without and with intravenous contrast  CONTRAST:  20mL MULTIHANCE GADOBENATE DIMEGLUMINE 529 MG/ML IV SOLN  COMPARISON:  Head CT 10/10/2009  FINDINGS: Diffusion imaging does not show any acute or subacute infarction. The brainstem and cerebellum are unremarkable. The cerebral hemispheres show scattered foci of FLAIR and T2 signal consistent with chronic small vessel disease. No cortical or large vessel territory infarction. No evidence of primary or metastatic mass lesion. No hemorrhage, hydrocephalus or extra-axial collection. No pituitary mass. No inflammatory sinus disease. No skull or skullbase abnormality.  The right vertebral artery is a small vessel that does not appear to demonstrate normal flow. Other vessels at the base of the brain show flow.  IMPRESSION: No evidence of metastatic disease.  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  Apparent abnormal flow a in the small right vertebral artery.   Electronically Signed   By: Paulina Fusi M.D.   On: 03/14/2013 20:27   Nm Pet Image Restag (ps) Skull Base To Thigh  02/24/2013   CLINICAL DATA:  Subsequent treatment strategy  for lung carcinoma. New left lung mass.  EXAM: NUCLEAR MEDICINE PET SKULL BASE TO THIGH  FASTING BLOOD GLUCOSE:  Value:  68 mg/dl  TECHNIQUE: 16.1 mCi W-96 FDG was injected intravenously. CT data was obtained and used for attenuation correction and anatomic localization only. (This was not acquired as a diagnostic CT examination.)  Additional exam technical data entered on technologist worksheet.  COMPARISON:  Chest CT on 02/14/2013 and prior PET-CT on 01/17/2010  FINDINGS: NECK  No hypermetabolic lymph nodes in the neck.  CHEST  A large mass is seen in the central left lower lobe which is contiguous with the left hilum and encases the left pulmonary artery. This mass has a maximum SUV 13.9. There is also hypermetabolic mediastinal lymphadenopathy on the left adjacent to the pulmonary artery and its and lateral aortic region as well as in the subcarinal region.  No hypermetabolic activity seen in the right hilum. CT images show a pulmonary emphysema, but no other suspicious pulmonary nodules or masses are identified. Postop changes are noted from prior right thoracotomy.  ABDOMEN/PELVIS  No abnormal hypermetabolic activity within the liver, pancreas, adrenal glands, or spleen. No hypermetabolic lymph nodes in the abdomen or pelvis.  SKELETON  No focal hypermetabolic activity to suggest skeletal metastasis.  IMPRESSION: Large hypermetabolic mass in the central left lower lobe, which involves the left hilum and encases the left pulmonary artery, consistent with primary bronchogenic carcinoma.  Metastatic mediastinal lymphadenopathy adjacent to left pulmonary artery, and in lateral aortic, and subcarinal regions.  No evidence of extrathoracic metastatic disease.   Electronically Signed   By: Myles Rosenthal M.D.   On: 02/24/2013 14:29     ASSESSMENT/PLAN: Patient is a very pleasant 57 year-old white male recently diagnosed with limited stage small cell lung cancer. He is status post one cycle of systemic chemotherapy  with carboplatin for an AUC of 5 given on day 1 and etoposide at 120 mg per meter squared on days 1, 2 and 3 with Neulasta support given on day 4. Patient was discussed with an also seen by Dr. Arbutus Ped. The MRI of his brain was negative for brain metastasis, the study was done on 03/14/2013 He is neutropenic today with a total white count of 1.0 and an ANC of 0.4. His platelets are also low at 65,000. Patient was given neutropenic as well as bleeding precautions. Both he and his wife voiced understanding. He will continue with weekly labs and return in 2 weeks prior to start of cycle #2.     Laural Benes, Kevin Debnam E, PA-C     All questions were answered. The patient knows to call the clinic with any problems, questions or concerns. We can certainly see the patient much sooner if necessary.  ADDENDUM: Hematology/Oncology Attending: I had the face to face encounter with the patient. I recommended his care plan. This is a very pleasant 57 years old white male recently diagnosed with limited stage small cell lung cancer and currently undergoing systemic chemotherapy with carboplatin and etoposide concurrent with radiation. He tolerated the first week of his treatment fairly well with no significant adverse effects except for neutropenia today. We provided the patient with neutropenic precautions as well as bleeding precautions. He would come back for followup visit in 2 weeks with the start of cycle #2. He was advised to call immediately if he has any concerning symptoms in the interval. Lajuana Matte., MD 03/20/2013

## 2013-03-17 ENCOUNTER — Ambulatory Visit
Admission: RE | Admit: 2013-03-17 | Discharge: 2013-03-17 | Disposition: A | Discharge status: Home / Self Care | Payer: BC Managed Care – PPO | Source: Ambulatory Visit | Attending: Radiation Oncology | Admitting: Radiation Oncology

## 2013-03-17 NOTE — Progress Notes (Signed)
[facility] Psychosocial Distress Screening Clinical Social Work  Clinical Social Work was referred by distress screening protocol.  The patient scored a 5 on the Psychosocial Distress Thermometer which indicates moderate distress. Clinical Social Worker Intern telephoned to assess for distress and other psychosocial needs. Patient did not answer phone.  CSWI left message requesting call back if interested in assistance.   Clinical Social Worker follow up needed: no  If yes, follow up plan:   [name] S. [facility] Clinical Social Work Intern [employer] [facility] [phone]

## 2013-03-17 NOTE — Patient Instructions (Signed)
Follow neutropenic precautions, call immediately for development of fever or chills Continue weekly labs as scheduled Followup in 2 weeks, prior to the start of your next scheduled cycle of chemotherapy

## 2013-03-18 ENCOUNTER — Ambulatory Visit
Admission: RE | Admit: 2013-03-18 | Discharge: 2013-03-18 | Disposition: A | Discharge status: Home / Self Care | Payer: BC Managed Care – PPO | Source: Ambulatory Visit | Attending: Radiation Oncology | Admitting: Radiation Oncology

## 2013-03-21 ENCOUNTER — Ambulatory Visit
Admission: RE | Admit: 2013-03-21 | Discharge: 2013-03-21 | Disposition: A | Discharge status: Home / Self Care | Payer: BC Managed Care – PPO | Source: Ambulatory Visit | Attending: Radiation Oncology | Admitting: Radiation Oncology

## 2013-03-22 ENCOUNTER — Ambulatory Visit
Admission: RE | Admit: 2013-03-22 | Discharge: 2013-03-22 | Disposition: A | Discharge status: Home / Self Care | Payer: BC Managed Care – PPO | Source: Ambulatory Visit | Attending: Radiation Oncology | Admitting: Radiation Oncology

## 2013-03-22 ENCOUNTER — Encounter: Payer: Self-pay | Admitting: Radiation Oncology

## 2013-03-22 VITALS — BP 91/61 | HR 78 | Temp 97.7°F | Resp 16 | Wt 230.6 lb

## 2013-03-22 DIAGNOSIS — C3490 Malignant neoplasm of unspecified part of unspecified bronchus or lung: Secondary | ICD-10-CM

## 2013-03-22 DIAGNOSIS — C3432 Malignant neoplasm of lower lobe, left bronchus or lung: Secondary | ICD-10-CM

## 2013-03-22 MED ORDER — RADIAPLEXRX EX GEL
Freq: Once | CUTANEOUS | Status: AC
  Administered 2013-03-22: 15:00:00 via TOPICAL

## 2013-03-22 NOTE — Progress Notes (Addendum)
Wearing yellow mask because ANC is 0. Appointment to repeat labs scheduled for tomorrow. Reports shortness of breath has greatly improved. Denies cough or difficulty swallowing. Denies pain at this time. Oriented patient to staff and routine of the clinic. Provided patient with radiaplex gel then, directed upon use. Provided patient with Radiation Therapy and You handbook. Encourage patient to review handbook and contact staff with future needs. Patient verbalized understanding of all reviewed.

## 2013-03-22 NOTE — Progress Notes (Signed)
Weekly Management Note Current Dose:10 Gy  Projected Dose:60 Gy   Narrative:  The patient presents for routine under treatment assessment.  CBCT/MVCT images/Port film x-rays were reviewed.  The chart was checked. Feeling much improved after chemo. Breathing obstruction has resolved. Annoyed at wearing mask. ANC count low after 1st chemo.   Physical Findings:  Appears better. In less distressed. More alert.   Vitals:  Filed Vitals:   03/22/13 1802  BP: 91/61  Pulse: 78  Temp: 97.7 F (36.5 C)  Resp: 16   Weight:  Wt Readings from Last 3 Encounters:  03/22/13 230 lb 9.6 oz (104.599 kg)  03/16/13 230 lb 8 oz (104.554 kg)  03/07/13 232 lb 11.2 oz (105.552 kg)   Lab Results  Component Value Date   WBC 1.0* 03/16/2013   HGB 13.3 03/16/2013   HCT 39.1 03/16/2013   MCV 93.8 03/16/2013   PLT 65* 03/16/2013   Lab Results  Component Value Date   CREATININE 1.4* 03/16/2013   BUN 30.5* 03/16/2013   NA 134* 03/16/2013   K 4.7 03/16/2013   CL 97 02/16/2013   CO2 24 03/16/2013     Impression:  The patient is tolerating radiation.  Plan:  Continue treatment as planned.n Labs repeated tomorrow.

## 2013-03-23 ENCOUNTER — Encounter: Payer: Self-pay | Admitting: Internal Medicine

## 2013-03-23 ENCOUNTER — Ambulatory Visit (INDEPENDENT_AMBULATORY_CARE_PROVIDER_SITE_OTHER): Payer: BC Managed Care – PPO | Admitting: Internal Medicine

## 2013-03-23 ENCOUNTER — Other Ambulatory Visit (HOSPITAL_BASED_OUTPATIENT_CLINIC_OR_DEPARTMENT_OTHER): Payer: BC Managed Care – PPO | Admitting: Lab

## 2013-03-23 ENCOUNTER — Ambulatory Visit
Admission: RE | Admit: 2013-03-23 | Discharge: 2013-03-23 | Disposition: A | Discharge status: Home / Self Care | Payer: BC Managed Care – PPO | Source: Ambulatory Visit | Attending: Radiation Oncology | Admitting: Radiation Oncology

## 2013-03-23 VITALS — BP 112/78 | HR 89 | Ht 70.0 in | Wt 236.6 lb

## 2013-03-23 DIAGNOSIS — J449 Chronic obstructive pulmonary disease, unspecified: Secondary | ICD-10-CM

## 2013-03-23 DIAGNOSIS — C3410 Malignant neoplasm of upper lobe, unspecified bronchus or lung: Secondary | ICD-10-CM

## 2013-03-23 DIAGNOSIS — C3492 Malignant neoplasm of unspecified part of left bronchus or lung: Secondary | ICD-10-CM

## 2013-03-23 LAB — CBC WITH DIFFERENTIAL/PLATELET
BASO%: 0.5 % (ref 0.0–2.0)
Basophils Absolute: 0 10*3/uL (ref 0.0–0.1)
EOS%: 0.8 % (ref 0.0–7.0)
HGB: 11.9 g/dL — ABNORMAL LOW (ref 13.0–17.1)
LYMPH%: 12 % — ABNORMAL LOW (ref 14.0–49.0)
MCH: 31.1 pg (ref 27.2–33.4)
MCHC: 33.4 g/dL (ref 32.0–36.0)
MCV: 93 fL (ref 79.3–98.0)
MONO%: 7.2 % (ref 0.0–14.0)
RBC: 3.82 10*6/uL — ABNORMAL LOW (ref 4.20–5.82)
RDW: 13.5 % (ref 11.0–14.6)
lymph#: 1.1 10*3/uL (ref 0.9–3.3)

## 2013-03-23 LAB — COMPREHENSIVE METABOLIC PANEL (CC13)
ALT: 18 U/L (ref 0–55)
AST: 13 U/L (ref 5–34)
Albumin: 3.2 g/dL — ABNORMAL LOW (ref 3.5–5.0)
Alkaline Phosphatase: 125 U/L (ref 40–150)
BUN: 20.5 mg/dL (ref 7.0–26.0)
Chloride: 103 mEq/L (ref 98–109)
Creatinine: 1.5 mg/dL — ABNORMAL HIGH (ref 0.7–1.3)
Potassium: 4.6 mEq/L (ref 3.5–5.1)
Total Bilirubin: 0.33 mg/dL (ref 0.20–1.20)

## 2013-03-23 MED ORDER — RADIAPLEXRX EX GEL
Freq: Once | CUTANEOUS | Status: AC
  Administered 2013-03-23: 16:00:00 via TOPICAL

## 2013-03-23 NOTE — Addendum Note (Signed)
Encounter addended by: Agnes Lawrence, RN on: 03/23/2013  3:59 PM<BR>     Documentation filed: Notes Section, Inpatient Document Flowsheet, Inpatient Patient Education, Chief Complaint Section

## 2013-03-23 NOTE — Addendum Note (Signed)
Encounter addended by: Agnes Lawrence, RN on: 03/23/2013  3:42 PM<BR>     Documentation filed: Inpatient MAR, Orders

## 2013-03-23 NOTE — Patient Instructions (Signed)
Glad you are doing well in terms of handling chemo Glad you had flu shot Glad copd is currently stable  - respect your decision to do only pro-air as needed Follo with oncologist See you  In 3 months 

## 2013-03-23 NOTE — Progress Notes (Signed)
Subjective:    Patient ID: [name], male    DOB: [birth year], 58 y.o.   MRN: [id number]  HPI  1. 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 - Clinical worsening to stage 4 Fev1 to 1.34L/37%, Ratio 49 in setting of mdi refusal ($$) - Nov 2012 - No desaturation on submaximal exertion Nov 2012 - CAT score at baseline (without med Rx) : 26  - Ma 2013  2. Ex-smoker - quit may 2011  3. Class 2-3 dyspnea but pre-op VO2 max 33ml/kg/min - CPST 02/01/2010  4. RUL PET Hot interemediate prob nodule, ENB non-diagnostic 9/28/20111. Culture: mycobacterium kansasii. Not on drug Rx  5. 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 Metabolic Syndrome with Obesitywith ESS - 3 in June 2012  7.Compliance with meds: Cost issues with MDI v reluctance to take MDI  - June 2012 - reluctant - Dec 2012 - samples given, new insurance + but still not buying mdi due to out of pocket cost     OV 03/23/2013   Gold stage II COPD and recently diagnosed small cell lung cancer in October 2014  - Presents for followup. He is adjusted to his diagnosis of lung cancer. His undergone one cycle of chemotherapy and is due for a second cycle shortly. Is undergoing radiation. He's handling both well. With both of these shortness of breath and cough improved significantly (thought he told CMA he was worse). Is only using pro-air as needed. He does not want scheduled inhalers Niece says he is stubborn like all in the family. He already he had his flu shot. There no new issues       Review of Systems  Constitutional: Negative for fever and unexpected weight change.  HENT: Negative for congestion, dental problem, ear pain, nosebleeds, postnasal drip, rhinorrhea, sinus pressure, sneezing, sore throat and trouble swallowing.   Eyes: Negative for redness and itching.  Respiratory: Positive for shortness of breath. Negative for cough, chest tightness and wheezing.    Cardiovascular: Negative for palpitations and leg swelling.  Gastrointestinal: Negative for nausea and vomiting.  Genitourinary: Negative for dysuria.  Musculoskeletal: Negative for joint swelling.  Skin: Negative for rash.  Neurological: Negative for headaches.  Hematological: Does not bruise/bleed easily.  Psychiatric/Behavioral: Negative for dysphoric mood. The patient is not nervous/anxious.        Objective:   Physical Exam  Nursing note and vitals reviewed. Constitutional: He is oriented to person, place, and time. He appears well-developed and well-nourished. No distress.  Obese Has mask on  HENT:  Head: Normocephalic and atraumatic.  Right Ear: External ear normal.  Left Ear: External ear normal.  Mouth/Throat: Oropharynx is clear and moist. No oropharyngeal exudate.  Eyes: Conjunctivae and EOM are normal. Pupils are equal, round, and reactive to light. Right eye exhibits no discharge. Left eye exhibits no discharge. No scleral icterus.  Neck: Normal range of motion. Neck supple. No JVD present. No tracheal deviation present. No thyromegaly present.  Cardiovascular: Normal rate, regular rhythm and intact distal pulses.  Exam reveals no gallop and no friction rub.   No murmur heard. Pulmonary/Chest: Effort normal and breath sounds normal. No respiratory distress. He has no wheezes. He has no rales. He exhibits no tenderness.  Abdominal: Soft. Bowel sounds are normal. He exhibits no distension and no mass. There is no tenderness. There is no rebound and no guarding.  Musculoskeletal: Normal range of motion. He  exhibits no edema and no tenderness.  Lymphadenopathy:    He has no cervical adenopathy.  Neurological: He is alert and oriented to person, place, and time. He has normal reflexes. No cranial nerve deficit. Coordination normal.  Skin: Skin is warm and dry. No rash noted. He is not diaphoretic. No erythema. No pallor.  Psychiatric: He has a normal mood and affect. His  behavior is normal. Judgment and thought content normal.

## 2013-03-24 ENCOUNTER — Encounter: Payer: Self-pay | Admitting: Radiation Oncology

## 2013-03-24 ENCOUNTER — Encounter (INDEPENDENT_AMBULATORY_CARE_PROVIDER_SITE_OTHER): Payer: Self-pay

## 2013-03-24 ENCOUNTER — Other Ambulatory Visit: Payer: Self-pay | Admitting: Radiation Oncology

## 2013-03-24 ENCOUNTER — Ambulatory Visit
Admission: RE | Admit: 2013-03-24 | Discharge: 2013-03-24 | Disposition: A | Discharge status: Home / Self Care | Payer: BC Managed Care – PPO | Source: Ambulatory Visit | Attending: Radiation Oncology | Admitting: Radiation Oncology

## 2013-03-24 VITALS — BP 79/51 | HR 86 | Temp 98.6°F

## 2013-03-24 DIAGNOSIS — C3492 Malignant neoplasm of unspecified part of left bronchus or lung: Secondary | ICD-10-CM

## 2013-03-24 MED ORDER — HYDROCODONE-ACETAMINOPHEN 7.5-325 MG/15ML PO SOLN: 10.0000 mL | Freq: Four times a day (QID) | ORAL | Status: DC | PRN

## 2013-03-24 NOTE — Progress Notes (Addendum)
[name] reports increasing pain in his left shoulder described as a burning sensation.  He is also c/o of an indigestion like sensation in his mid chest region.  He is s/p  Systemic chemotherapy with carboplatin for an AUC of 5 given on day 1 and etoposide at 120 mg per meter squared given on days 1, 2 and 3 with Neulasta support given on day 4 given every 3 weeks. Status post 1 cycle as of 03/11/13. Hypotension noted sitting and standing without any marked elevation in his pulse.  Instructed per Dr. Dayton Scrape, not take his BP medications starting tomorrow.  Given copy of AVS with Medications highlighted -  Seen by Dr. Chipper Herb today.  Amlodipine, Cozaar, and Zebeta.  Instructed [name] to arrive in nursing on tomorrow for a BP check and he agreed.

## 2013-03-24 NOTE — Progress Notes (Addendum)
Weekly Management Note:  Site: Left lung/mediastinum Current Dose:  1400  cGy Projected Dose: 6000  cGy  Narrative: The patient is seen today for routine under treatment assessment. CBCT/MVCT images/port films were reviewed. Cone beam CT from earlier today shows favorable tumor regression. The chart was reviewed.   The patient is seen today complaining of "indigestion" with mild to moderate odynophagia. He also reports discomfort along his left shoulder. He is 2 weeks out from his last chemotherapy. I reviewed his PET scan, and he appears to have limited stage disease with no evidence of metastatic disease to bone.  Physical Examination: Alert and oriented, in no acute distress.  Blood pressure 79/51, pulse 86, sitting, and 83/59, pulse 97 standing.  Oral cavity and oropharynx without evidence for candidiasis. There is no palpable shoulder or humeral pain.  Laboratory data: Lab Results  Component Value Date   WBC 8.8 03/23/2013   HGB 11.9* 03/23/2013   HCT 35.5* 03/23/2013   MCV 93.0 03/23/2013   PLT 86* 03/23/2013   CMP     Component Value Date/Time   NA 140 03/23/2013 1147   NA 133* 02/16/2013 0600   K 4.6 03/23/2013 1147   K 4.5 02/16/2013 0600   CL 97 02/16/2013 0600   CO2 29 03/23/2013 1147   CO2 24 02/16/2013 0600   GLUCOSE 180* 03/23/2013 1147   GLUCOSE 248* 02/16/2013 0600   BUN 20.5 03/23/2013 1147   BUN 30* 02/16/2013 0600   CREATININE 1.5* 03/23/2013 1147   CREATININE 1.49* 02/16/2013 0600   CREATININE 1.46* 12/10/2010 0843   CALCIUM 9.9 03/23/2013 1147   CALCIUM 9.1 02/16/2013 0600   PROT 6.4 03/23/2013 1147   PROT 6.9 02/16/2013 0600   ALBUMIN 3.2* 03/23/2013 1147   ALBUMIN 3.4* 02/16/2013 0600   AST 13 03/23/2013 1147   AST 13 02/16/2013 0600   ALT 18 03/23/2013 1147   ALT 18 02/16/2013 0600   ALKPHOS 125 03/23/2013 1147   ALKPHOS 86 02/16/2013 0600   BILITOT 0.33 03/23/2013 1147   BILITOT 0.3 02/16/2013 0600   GFRNONAA 50* 02/16/2013 0600   GFRAA 58* 02/16/2013  0600    Impression: 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 He also appears to becoming hypotensive and we told him to stop his 2 blood pressure medications for now. On starting him on hydrocodone/APAP (Hycet) elixir to use when necessary. I encouraged him to increase his fluid and caloric intake.  Plan: Continue radiation therapy as planned.

## 2013-03-25 ENCOUNTER — Ambulatory Visit
Admission: RE | Admit: 2013-03-25 | Discharge: 2013-03-25 | Disposition: A | Discharge status: Home / Self Care | Payer: BC Managed Care – PPO | Source: Ambulatory Visit | Attending: Radiation Oncology | Admitting: Radiation Oncology

## 2013-03-25 ENCOUNTER — Telehealth: Payer: Self-pay | Admitting: *Deleted

## 2013-03-25 ENCOUNTER — Encounter: Payer: Self-pay | Admitting: *Deleted

## 2013-03-25 NOTE — Progress Notes (Signed)
Called and was able to reach [name].  He reported that he had received the message from his sister to take his Zebeta, but he held the Norvasc and Cozaar until he can be assessed today.  He continues to deny any dizziness or lightheadedness s when changing positions form lying to sitting or sitting to standing.

## 2013-03-25 NOTE — Telephone Encounter (Addendum)
Called Mr. Worlds sister Lysbeth Galas to inform her to please contact Mr. Montz and instruct him to take his Bisprolol (Zebeta), but not to take his Norvasc and Losartan until he arrives for a BP check today.  Have called his residence 4 times and unable to reach him.  Ms. Maisie Fus thinks he may be asleep, so if she can't reach him, via phone,  she will ask his neighbor to go to his home to give him the message regarding the need to take his Bisprolol.  On last evening Mr. Allard was instructed to not take his Norvasc, Cozaar, and Bisprolol because of marked hypotension.  Conferred with Tiana Loft, PA, this morning, on which medications to hold and she agrees with above.  Called [facility] and spoke with Dr. Antoine Poche and was referred to Dr. Sherlie Ban

## 2013-03-25 NOTE — Progress Notes (Signed)
Patient  Stopped at nursing before treatment, b/p taken, gave med list on 2 meds not to take, and will check on Monday agaibn, b/p is a lot better 5:58 PM

## 2013-03-25 NOTE — Telephone Encounter (Signed)
Received Call from Dr. Sherlie Ban from [facility].  Informed him of [name] hypotension from yesterday and he stated to have him discontinue his Norvasc, completely, and to only resume his Cozaar when his BP is more stable ( elevated).  Called [name], but no answer and unable to leave a voicemail.  Will talk with [name] when he arrives for his treatment today.

## 2013-03-25 NOTE — Addendum Note (Signed)
Encounter addended by: Delynn Flavin, RN on: 03/25/2013 12:46 PM<BR>     Documentation filed: Demographics Visit

## 2013-03-27 NOTE — Assessment & Plan Note (Signed)
Glad you are doing well in terms of handling chemo Glad you had flu shot Glad copd is currently stable  - respect your decision to do only pro-air as needed [facility] with oncologist See you  In 3 months

## 2013-03-28 ENCOUNTER — Ambulatory Visit
Admission: RE | Admit: 2013-03-28 | Discharge: 2013-03-28 | Disposition: A | Discharge status: Home / Self Care | Payer: BC Managed Care – PPO | Source: Ambulatory Visit | Attending: Radiation Oncology | Admitting: Radiation Oncology

## 2013-03-29 ENCOUNTER — Ambulatory Visit
Admission: RE | Admit: 2013-03-29 | Discharge: 2013-03-29 | Disposition: A | Discharge status: Home / Self Care | Payer: BC Managed Care – PPO | Source: Ambulatory Visit | Attending: Radiation Oncology | Admitting: Radiation Oncology

## 2013-03-29 VITALS — BP 100/66 | HR 92 | Temp 98.4°F | Wt 234.6 lb

## 2013-03-29 DIAGNOSIS — C3492 Malignant neoplasm of unspecified part of left bronchus or lung: Secondary | ICD-10-CM

## 2013-03-29 NOTE — Progress Notes (Signed)
Weekly Management Note Current Dose:20 Gy  Projected Dose:60 Gy   Narrative:  The patient presents for routine under treatment assessment.  CBCT/MVCT images/Port film x-rays were reviewed.  The chart was checked. More acid feeling at night. Wakes up and feels like fire up to his mouth. Feels better after sitting up for an hour or 2. Pain med works during the day but doesn't last long enough at night. Wants to hold off on long acting narcotic as long as possible.   Physical Findings:  Appears tired. In a wheelchair.   Vitals:  Filed Vitals:   03/29/13 0838  BP: 100/66  Pulse: 92  Temp: 98.4 F (36.9 C)   Weight:  Wt Readings from Last 3 Encounters:  03/29/13 234 lb 9.6 oz (106.414 kg)  03/23/13 236 lb 9.6 oz (107.321 kg)  03/22/13 230 lb 9.6 oz (104.599 kg)   Lab Results  Component Value Date   WBC 8.8 03/23/2013   HGB 11.9* 03/23/2013   HCT 35.5* 03/23/2013   MCV 93.0 03/23/2013   PLT 86* 03/23/2013   Lab Results  Component Value Date   CREATININE 1.5* 03/23/2013   BUN 20.5 03/23/2013   NA 140 03/23/2013   K 4.6 03/23/2013   CL 97 02/16/2013   CO2 29 03/23/2013     Impression:  The patient is tolerating radiation.  Plan:  Continue treatment as planned. Sounds more like acid from decadron premeds for chemo due to nocturnal symptoms and the fact it goes away when he sits up. Try PPI and antacid at night and re-eval Thursday or Friday. Sister with him and supportive.

## 2013-03-29 NOTE — Progress Notes (Signed)
Weekly assessment of radiation to left chest.Completed 10 of 30 treatments.Has esophageal pain up to "8" on 0 to 10 scale.Takes hycet but states ineffective.Pain greater on swallowing.Mild increase in shortness of breath and fatigue.

## 2013-03-30 ENCOUNTER — Ambulatory Visit (HOSPITAL_BASED_OUTPATIENT_CLINIC_OR_DEPARTMENT_OTHER): Payer: BC Managed Care – PPO | Admitting: Physician Assistant

## 2013-03-30 ENCOUNTER — Telehealth: Payer: Self-pay | Admitting: Internal Medicine

## 2013-03-30 ENCOUNTER — Other Ambulatory Visit: Payer: BC Managed Care – PPO | Admitting: Lab

## 2013-03-30 ENCOUNTER — Ambulatory Visit
Admission: RE | Admit: 2013-03-30 | Discharge: 2013-03-30 | Disposition: A | Discharge status: Home / Self Care | Payer: BC Managed Care – PPO | Source: Ambulatory Visit | Attending: Radiation Oncology | Admitting: Radiation Oncology

## 2013-03-30 ENCOUNTER — Other Ambulatory Visit (HOSPITAL_BASED_OUTPATIENT_CLINIC_OR_DEPARTMENT_OTHER): Payer: BC Managed Care – PPO | Admitting: Lab

## 2013-03-30 ENCOUNTER — Ambulatory Visit (HOSPITAL_BASED_OUTPATIENT_CLINIC_OR_DEPARTMENT_OTHER): Payer: BC Managed Care – PPO

## 2013-03-30 ENCOUNTER — Encounter: Payer: Self-pay | Admitting: Internal Medicine

## 2013-03-30 ENCOUNTER — Encounter: Payer: Self-pay | Admitting: Physician Assistant

## 2013-03-30 VITALS — BP 105/67 | HR 86 | Temp 98.3°F | Resp 18 | Ht 70.0 in | Wt 233.4 lb

## 2013-03-30 DIAGNOSIS — C3490 Malignant neoplasm of unspecified part of unspecified bronchus or lung: Secondary | ICD-10-CM

## 2013-03-30 DIAGNOSIS — Z5111 Encounter for antineoplastic chemotherapy: Secondary | ICD-10-CM

## 2013-03-30 DIAGNOSIS — C3492 Malignant neoplasm of unspecified part of left bronchus or lung: Secondary | ICD-10-CM

## 2013-03-30 DIAGNOSIS — I10 Essential (primary) hypertension: Secondary | ICD-10-CM

## 2013-03-30 DIAGNOSIS — C3430 Malignant neoplasm of lower lobe, unspecified bronchus or lung: Secondary | ICD-10-CM

## 2013-03-30 LAB — COMPREHENSIVE METABOLIC PANEL (CC13)
ALT: 17 U/L (ref 0–55)
AST: 16 U/L (ref 5–34)
Albumin: 3.1 g/dL — ABNORMAL LOW (ref 3.5–5.0)
Anion Gap: 8 mEq/L (ref 3–11)
CO2: 26 mEq/L (ref 22–29)
Calcium: 9.7 mg/dL (ref 8.4–10.4)
Chloride: 103 mEq/L (ref 98–109)
Glucose: 161 mg/dl — ABNORMAL HIGH (ref 70–140)
Potassium: 5 mEq/L (ref 3.5–5.1)
Sodium: 138 mEq/L (ref 136–145)
Total Protein: 6.6 g/dL (ref 6.4–8.3)

## 2013-03-30 LAB — CBC WITH DIFFERENTIAL/PLATELET
BASO%: 0.4 % (ref 0.0–2.0)
Basophils Absolute: 0 10*3/uL (ref 0.0–0.1)
EOS%: 0.3 % (ref 0.0–7.0)
Eosinophils Absolute: 0 10*3/uL (ref 0.0–0.5)
HGB: 11.5 g/dL — ABNORMAL LOW (ref 13.0–17.1)
LYMPH%: 8.6 % — ABNORMAL LOW (ref 14.0–49.0)
MCH: 32.5 pg (ref 27.2–33.4)
MCV: 91.5 fL (ref 79.3–98.0)
MONO#: 0.9 10*3/uL (ref 0.1–0.9)
MONO%: 10.6 % (ref 0.0–14.0)
NEUT#: 6.7 10*3/uL — ABNORMAL HIGH (ref 1.5–6.5)
Platelets: 293 10*3/uL (ref 140–400)
RBC: 3.54 10*6/uL — ABNORMAL LOW (ref 4.20–5.82)

## 2013-03-30 MED ORDER — DEXAMETHASONE SODIUM PHOSPHATE 20 MG/5ML IJ SOLN
INTRAMUSCULAR | Status: AC
  Filled 2013-03-30: qty 5

## 2013-03-30 MED ORDER — SODIUM CHLORIDE 0.9 % IV SOLN
120.0000 mg/m2 | Freq: Once | INTRAVENOUS | Status: AC
  Administered 2013-03-30: 270 mg via INTRAVENOUS
  Filled 2013-03-30: qty 13.5

## 2013-03-30 MED ORDER — ONDANSETRON 16 MG/50ML IVPB (CHCC)
16.0000 mg | Freq: Once | INTRAVENOUS | Status: AC
  Administered 2013-03-30: 16 mg via INTRAVENOUS

## 2013-03-30 MED ORDER — DEXAMETHASONE SODIUM PHOSPHATE 20 MG/5ML IJ SOLN
20.0000 mg | Freq: Once | INTRAMUSCULAR | Status: AC
  Administered 2013-03-30: 20 mg via INTRAVENOUS

## 2013-03-30 MED ORDER — SODIUM CHLORIDE 0.9 % IV SOLN
530.0000 mg | Freq: Once | INTRAVENOUS | Status: AC
  Administered 2013-03-30: 530 mg via INTRAVENOUS
  Filled 2013-03-30: qty 53

## 2013-03-30 MED ORDER — ONDANSETRON 16 MG/50ML IVPB (CHCC)
INTRAVENOUS | Status: AC
  Filled 2013-03-30: qty 16

## 2013-03-30 MED ORDER — SODIUM CHLORIDE 0.9 % IV SOLN
Freq: Once | INTRAVENOUS | Status: AC
  Administered 2013-03-30: 12:00:00 via INTRAVENOUS

## 2013-03-30 NOTE — Patient Instructions (Signed)
Continue weekly labs as scheduled Followup with Dr. Mohamed in 3 weeks with a restaging CT scan of your chest to reevaluate your disease 

## 2013-03-30 NOTE — Progress Notes (Addendum)
No images are attached to the encounter. No scans are attached to the encounter. No scans are attached to the encounter. [facility] SHARED VISIT PROGRESS NOTE  Clydell Hakim, MD [address] [name] [address]  DIAGNOSIS: Small cell carcinoma of lung   Primary site: Lung (Left)   Staging method: AJCC 7th Edition   Clinical: Stage IIIA (T2a, N2, M0)   Summary: Stage IIIA (T2a, N2, M0) Malignant neoplasm of lower lobe of left lung   Primary site: Lung (Left)   Staging method: AJCC 7th Edition   Summary: Incomplete stage    PRIOR THERAPY: None  CURRENT THERAPY: Systemic chemotherapy with carboplatin for an AUC of 5 given on day 1 and etoposide at 120 mg per meter squared given on days 1, 2 and 3 with Neulasta support given on day 4 given every 3 weeks. 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   Primary site: Lung (Left)   Staging method: AJCC 7th Edition   Clinical: Stage IIIA (T2a, N2, M0)   Summary: Stage IIIA (T2a, N2, M0) Malignant neoplasm of lower lobe of left lung   Primary site: Lung (Left)   Staging method: AJCC 7th Edition   Summary: Incomplete stage  CHEMOTHERAPY INTENT: Palliative  CURRENT # OF CHEMOTHERAPY CYCLES: 2  CURRENT ANTIEMETICS: Zofran, dexamethasone and Compazine  CURRENT SMOKING STATUS: Former smoker, quit 09/09/2009  ORAL CHEMOTHERAPY AND CONSENT: N./A  CURRENT BISPHOSPHONATES USE: None  PAIN MANAGEMENT: None  NARCOTICS INDUCED CONSTIPATION: None  LIVING WILL AND CODE STATUS: ?   INTERVAL HISTORY: [name] 57 y.o. male returns for a scheduled regular symptom management visit for followup of his recently diagnosed limited stage small cell lung cancer. He is accompanied by his niece today. He does have some problems with hypotension with some orthostasis. He reports that his Norvasc and Cozaar have been discontinued but he remains on his  Zebeta and Imdur. He denies any dizziness. He complains of some indigestion and her recommendations by Dr. Michell Heinrich, is using some over-the-counter medications with some relief. He also reports an intermittent right tonsillar "drainage" if he massages that area when it is sore. He states that this is been a long-standing issue and not a new problem. Is not associated with any fever or chair chills or foul-smelling secretions. He continues to use his oxygen on an intermittent basis when he exerts himself. He had a wise is not having any significant shortness of breath. He presents today to proceed with cycle #2 of his systemic chemotherapy with carboplatin and etoposide with Neulasta support.   MEDICAL HISTORY: Past Medical History  Diagnosis Date  . CAD (coronary artery disease)     a. PCI 3/08 to OM2 and mid CFX; b.  NSTEMI 6/10:  Prior stents patent, 90% dCFX,  mRCA occl (old) with collats; EF 55% on LV-gram => Xience DES 2.5 x 23 mm to distal CFX;  c. abnl MV 12/13 => LHC 04/23/12:  pLAD 50%, oOM1 80%, long stented segment of the CFX extending into the PLOM with the PLOM totally occluded ostially, CFX 70% ISR, mCFX 80% ISR, pRCA chronically occluded. Med Rx planned  . Diabetes mellitus type II   . Hyperlipidemia   . COPD (chronic obstructive pulmonary disease)     Quit tobacco 09/2009; Gold Stage 2 with asthma component - FEV1 1.53 L/54%, 14% fev1 BD response, DLCO 54% - July 2011; MM genotype 01/07/10;  unable to afford spiriva and unwilling to try ics due to prior renal failure: state to Dr. Marchelle Gearing, Aug 2011; started on atrovent HFA fall 2011. no desturation on walk test Dec 2011  . CKD (chronic kidney disease)   . Obesity   . History of CVA (cerebrovascular accident) 2007    Without residual defecits  . 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 no further rx  . CHF (congestive heart failure)   . Myocardial infarction     x3  . Hypertension   . Asthma     as child  . Shortness of breath     constant/wears 2 liters  . Pneumonia     02/14/2013 in hospital  . Stroke     7 years- no problems   . Cancer     lung cancer-02/14/2013    ALLERGIES:  is allergic to erythromycin and penicillins.  MEDICATIONS:  Current Outpatient Prescriptions  Medication Sig Dispense Refill  . aspirin EC 81 MG tablet Take 81 mg by mouth daily.      Marland Kitchen atorvastatin (LIPITOR) 40 MG tablet Take 40 mg by mouth at bedtime.      . bisoprolol (ZEBETA) 5 MG tablet Take 5 mg by mouth daily.      . clopidogrel (PLAVIX) 75 MG tablet Take 1 tablet (75 mg total) by mouth daily. Resume once cleared by the lung doctor after bronchoscopy and lung biopsy      . Fluticasone Furoate-Vilanterol (BREO ELLIPTA) 100-25 MCG/INH AEPB Inhale 1 puff into the lungs 2 (two) times daily.  28 each  1  . glipiZIDE (GLUCOTROL) 10 MG tablet Take 10 mg by mouth 2 (two) times daily before a meal.       . HYDROcodone-acetaminophen (HYCET) 7.5-325 mg/15 ml solution Take 10 mLs by mouth 4 (four) times daily as needed for moderate pain.  473 mL  0  . isosorbide mononitrate (IMDUR) 60 MG 24 hr tablet Take 1 tablet (60 mg total) by mouth daily.  30 tablet  8  . metFORMIN (GLUCOPHAGE-XR) 750 MG 24 hr tablet Take 1,500 mg by mouth daily with breakfast.      . albuterol (PROAIR HFA) 108 (90 BASE) MCG/ACT inhaler Inhale 2 puffs into the lungs every 6 (six) hours as needed for wheezing or shortness of breath.      Marland Kitchen albuterol (PROVENTIL) (2.5 MG/3ML) 0.083% nebulizer solution Take 3 mLs (2.5 mg total) by nebulization 5 (five) times daily as needed for wheezing or shortness of breath.  75 mL  12  . amLODipine (NORVASC) 2.5 MG tablet Take 2.5 mg by  mouth daily.      Marland Kitchen losartan (COZAAR) 50 MG tablet Take 1 tablet (50 mg total) by mouth daily.  30 tablet  8  . nitroGLYCERIN (NITROSTAT) 0.4 MG SL tablet Place 0.4 mg under the tongue every 5 (five) minutes as needed for chest pain. May repeat up to 3 doses      . prochlorperazine (COMPAZINE) 10 MG tablet Take 1 tablet (10 mg total) by mouth every 6 (six) hours as needed.  30 tablet  0  . Wound Cleansers (RADIAPLEX EX) Apply topically.       No current facility-administered  medications for this visit.    SURGICAL HISTORY:  Past Surgical History  Procedure Laterality Date  . Wedge resection  03/28/2010  . Coronary stent placement  2009  . Cardiac catheterization  2009  . Endobronchial ultrasound Bilateral 02/28/2013    Procedure: ENDOBRONCHIAL ULTRASOUND;  Surgeon: Kalman Shan, MD;  Location: [facility];  Service: Cardiopulmonary;  Laterality: Bilateral;    REVIEW OF SYSTEMS:  Constitutional: positive for fatigue Eyes: negative Ears, nose, mouth, throat, and face: positive for sore throat and Occasional white drainage from his right tonsillar area not associated with fever chills or foul-smelling secretions, long-standing issue Respiratory: positive for dyspnea on exertion Cardiovascular: negative Gastrointestinal: negative Genitourinary:negative Integument/breast: negative Hematologic/lymphatic: negative Musculoskeletal:negative Neurological: negative Behavioral/Psych: negative Endocrine: negative Allergic/Immunologic: negative   PHYSICAL EXAMINATION: General appearance: alert, cooperative, appears stated age and no distress Head: Normocephalic, without obvious abnormality, atraumatic Neck: no adenopathy, no carotid bruit, no JVD, supple, symmetrical, trachea midline and thyroid not enlarged, symmetric, no tenderness/mass/nodules Lymph nodes: Cervical, supraclavicular, and axillary nodes normal. Resp: clear to auscultation bilaterally Cardio: regular rate and rhythm,  S1, S2 normal, no murmur, click, rub or gallop GI: soft, non-tender; bowel sounds normal; no masses,  no organomegaly Extremities: extremities normal, atraumatic, no cyanosis or edema  ECOG PERFORMANCE STATUS: 1 - Symptomatic but completely ambulatory  Blood pressure 105/67, pulse 86, temperature 98.3 F (36.8 C), temperature source Oral, resp. rate 18, height 5\' 10"  (1.778 m), weight 233 lb 6.4 oz (105.87 kg).  LABORATORY DATA: Lab Results  Component Value Date   WBC 8.4 03/30/2013   HGB 11.5* 03/30/2013   HCT 32.4* 03/30/2013   MCV 91.5 03/30/2013   PLT 293 03/30/2013      Chemistry      Component Value Date/Time   NA 138 03/30/2013 0859   NA 133* 02/16/2013 0600   K 5.0 03/30/2013 0859   K 4.5 02/16/2013 0600   CL 97 02/16/2013 0600   CO2 26 03/30/2013 0859   CO2 24 02/16/2013 0600   BUN 22.4 03/30/2013 0859   BUN 30* 02/16/2013 0600   CREATININE 1.4* 03/30/2013 0859   CREATININE 1.49* 02/16/2013 0600   CREATININE 1.46* 12/10/2010 0843      Component Value Date/Time   CALCIUM 9.7 03/30/2013 0859   CALCIUM 9.1 02/16/2013 0600   ALKPHOS 130 03/30/2013 0859   ALKPHOS 86 02/16/2013 0600   AST 16 03/30/2013 0859   AST 13 02/16/2013 0600   ALT 17 03/30/2013 0859   ALT 18 02/16/2013 0600   BILITOT 0.35 03/30/2013 0859   BILITOT 0.3 02/16/2013 0600       RADIOGRAPHIC STUDIES:  Mr Laqueta Jean ZO Contrast  03/14/2013   CLINICAL DATA:  New diagnosis small cell lung cancer. Staging.  EXAM: MRI HEAD WITHOUT AND WITH CONTRAST  TECHNIQUE: Multiplanar, multiecho pulse sequences of the brain and surrounding structures were obtained according to standard protocol without and with intravenous contrast  CONTRAST:  20mL MULTIHANCE GADOBENATE DIMEGLUMINE 529 MG/ML IV SOLN  COMPARISON:  Head CT 10/10/2009  FINDINGS: Diffusion imaging does not show any acute or subacute infarction. The brainstem and cerebellum are unremarkable. The cerebral hemispheres show scattered foci of FLAIR and T2 signal  consistent with chronic small vessel disease. No cortical or large vessel territory infarction. No evidence of primary or metastatic mass lesion. No hemorrhage, hydrocephalus or extra-axial collection. No pituitary mass. No inflammatory sinus disease. No skull or skullbase abnormality.  The right vertebral artery is a small vessel that does not appear  to demonstrate normal flow. Other vessels at the base of the brain show flow.  IMPRESSION: No evidence of metastatic disease.  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  Apparent abnormal flow a in the small right vertebral artery.   Electronically Signed   By: Paulina Fusi M.D.   On: 03/14/2013 20:27   Nm Pet Image Restag (ps) Skull Base To Thigh  02/24/2013   CLINICAL DATA:  Subsequent treatment strategy for lung carcinoma. New left lung mass.  EXAM: NUCLEAR MEDICINE PET SKULL BASE TO THIGH  FASTING BLOOD GLUCOSE:  Value:  68 mg/dl  TECHNIQUE: 57.8 mCi I-69 FDG was injected intravenously. CT data was obtained and used for attenuation correction and anatomic localization only. (This was not acquired as a diagnostic CT examination.) Additional exam technical data entered on technologist worksheet.  COMPARISON:  Chest CT on 02/14/2013 and prior PET-CT on 01/17/2010  FINDINGS: NECK  No hypermetabolic lymph nodes in the neck.  CHEST  A large mass is seen in the central left lower lobe which is contiguous with the left hilum and encases the left pulmonary artery. This mass has a maximum SUV 13.9. There is also hypermetabolic mediastinal lymphadenopathy on the left adjacent to the pulmonary artery and its and lateral aortic region as well as in the subcarinal region.  No hypermetabolic activity seen in the right hilum. CT images show a pulmonary emphysema, but no other suspicious pulmonary nodules or masses are identified. Postop changes are noted from prior right thoracotomy.  ABDOMEN/PELVIS  No abnormal hypermetabolic activity within  the liver, pancreas, adrenal glands, or spleen. No hypermetabolic lymph nodes in the abdomen or pelvis.  SKELETON  No focal hypermetabolic activity to suggest skeletal metastasis.  IMPRESSION: Large hypermetabolic mass in the central left lower lobe, which involves the left hilum and encases the left pulmonary artery, consistent with primary bronchogenic carcinoma.  Metastatic mediastinal lymphadenopathy adjacent to left pulmonary artery, and in lateral aortic, and subcarinal regions.  No evidence of extrathoracic metastatic disease.   Electronically Signed   By: Myles Rosenthal M.D.   On: 02/24/2013 14:29     ASSESSMENT/PLAN: Patient is a very pleasant 57 year-old white male recently diagnosed with limited stage small cell lung cancer. He is status post one cycle of systemic chemotherapy with carboplatin for an AUC of 5 given on day 1 and etoposide at 120 mg per meter squared on days 1, 2 and 3 with Neulasta support given on day 4. Patient was discussed with an also seen by Dr. Arbutus Ped. He'll proceed with cycle #2 of his systemic chemotherapy with carboplatin and etoposide with Neulasta support as scheduled today. He'll continue with weekly labs consisting of a CBC differential and C. met. He'll followup with Dr. Arbutus Ped in 3 weeks with a restaging CT scan of his chest with contrast to reevaluate his disease.    Laural Benes, Durenda Pechacek E, PA-C     All questions were answered. The patient knows to call the clinic with any problems, questions or concerns. We can certainly see the patient much sooner if necessary.  ADDENDUM: Hematology/Oncology Attending: I had a face to face encounter with the patient. I recommended his care plan. This is a very pleasant 57 years old white male with limited stage small cell lung cancer currently undergoing systemic chemotherapy concurrent with radiation. The patient is tolerating his chemotherapy fairly well with no significant adverse effects. Has some sore throat from the  radiation treatment as well as hypertension secondary to  multiple blood pressure medication. He was seen by his primary care physician who discontinued some of his medication including Norvasc and Cozaar. The patient denied having any other complaints today. We'll proceed with cycle #2 as scheduled today. He would come back for followup visit in 3 weeks with repeat CT scan of the chest for restaging of his disease. He was advised to call immediately if she has any concerning symptoms in the interval. Lajuana Matte., MD 03/31/2013

## 2013-03-30 NOTE — Patient Instructions (Signed)
[facility] Discharge Instructions for Patients Receiving Chemotherapy  Today you received the following chemotherapy agents Carboplatin/VP 16 To help prevent nausea and vomiting after your treatment, we encourage you to take your nausea medication as prescribed.  If you develop nausea and vomiting that is not controlled by your nausea medication, call the clinic.   BELOW ARE SYMPTOMS THAT SHOULD BE REPORTED IMMEDIATELY:  *FEVER GREATER THAN 100.5 F  *CHILLS WITH OR WITHOUT FEVER  NAUSEA AND VOMITING THAT IS NOT CONTROLLED WITH YOUR NAUSEA MEDICATION  *UNUSUAL SHORTNESS OF BREATH  *UNUSUAL BRUISING OR BLEEDING  TENDERNESS IN MOUTH AND THROAT WITH OR WITHOUT PRESENCE OF ULCERS  *URINARY PROBLEMS  *BOWEL PROBLEMS  UNUSUAL RASH Items with * indicate a potential emergency and should be followed up as soon as possible.  Feel free to call the clinic you have any questions or concerns. The clinic phone number is [phone].    

## 2013-03-30 NOTE — Telephone Encounter (Signed)
gv and printed appt sched and avs for pt for NOV and DEC....sed added tx. °

## 2013-03-31 ENCOUNTER — Ambulatory Visit: Payer: BC Managed Care – PPO | Admitting: Nutrition

## 2013-03-31 ENCOUNTER — Ambulatory Visit (HOSPITAL_BASED_OUTPATIENT_CLINIC_OR_DEPARTMENT_OTHER): Payer: BC Managed Care – PPO

## 2013-03-31 ENCOUNTER — Ambulatory Visit
Admission: RE | Admit: 2013-03-31 | Discharge: 2013-03-31 | Disposition: A | Discharge status: Home / Self Care | Payer: BC Managed Care – PPO | Source: Ambulatory Visit | Attending: Radiation Oncology | Admitting: Radiation Oncology

## 2013-03-31 VITALS — BP 146/59 | HR 66 | Temp 97.8°F | Resp 18

## 2013-03-31 VITALS — BP 115/69 | HR 97 | Temp 97.7°F

## 2013-03-31 DIAGNOSIS — C3430 Malignant neoplasm of lower lobe, unspecified bronchus or lung: Secondary | ICD-10-CM

## 2013-03-31 DIAGNOSIS — C3492 Malignant neoplasm of unspecified part of left bronchus or lung: Secondary | ICD-10-CM

## 2013-03-31 DIAGNOSIS — Z5111 Encounter for antineoplastic chemotherapy: Secondary | ICD-10-CM

## 2013-03-31 DIAGNOSIS — C3432 Malignant neoplasm of lower lobe, left bronchus or lung: Secondary | ICD-10-CM

## 2013-03-31 MED ORDER — DEXAMETHASONE SODIUM PHOSPHATE 10 MG/ML IJ SOLN
INTRAMUSCULAR | Status: AC
  Filled 2013-03-31: qty 1

## 2013-03-31 MED ORDER — TRAMADOL HCL 50 MG PO TABS: 50.0000 mg | ORAL_TABLET | Freq: Four times a day (QID) | ORAL | Status: DC | PRN

## 2013-03-31 MED ORDER — ONDANSETRON 8 MG/NS 50 ML IVPB
INTRAVENOUS | Status: AC
  Filled 2013-03-31: qty 8

## 2013-03-31 MED ORDER — SODIUM CHLORIDE 0.9 % IV SOLN
120.0000 mg/m2 | Freq: Once | INTRAVENOUS | Status: AC
  Administered 2013-03-31: 270 mg via INTRAVENOUS
  Filled 2013-03-31: qty 13.5

## 2013-03-31 MED ORDER — SODIUM CHLORIDE 0.9 % IV SOLN
Freq: Once | INTRAVENOUS | Status: AC
  Administered 2013-03-31: 14:00:00 via INTRAVENOUS

## 2013-03-31 MED ORDER — ONDANSETRON 8 MG/50ML IVPB (CHCC)
8.0000 mg | Freq: Once | INTRAVENOUS | Status: AC
  Administered 2013-03-31: 8 mg via INTRAVENOUS

## 2013-03-31 MED ORDER — DEXAMETHASONE SODIUM PHOSPHATE 10 MG/ML IJ SOLN
10.0000 mg | Freq: Once | INTRAMUSCULAR | Status: AC
  Administered 2013-03-31: 10 mg via INTRAVENOUS

## 2013-03-31 NOTE — Progress Notes (Signed)
Nutrition followup completed in chemotherapy.  Patient has a good appetite.  His weight is stable and was documented as 233.4 pounds November 19, which is increased slightly from 232.7 pounds October 27.  Patient reports some taste alterations.  However, it has not affected his intake.  He is enjoying ice cream and milk.  Patient denies nausea and constipation.  Patient reports some esophagitis.  However, it is not affecting his diet at this time.  He does complain of shoulder pain.  His physician is aware.  Nutrition diagnosis: Food and nutrition related knowledge deficit improved.  Intervention: Patient was educated to continue smaller, more frequent meals and snacks throughout the day for weight maintenance.  Patient educated on strategies for eating with worsening esophagitis.  Teach back method used.  Monitoring, evaluation, goals: Patient is tolerating his oral diet and has had weight maintenance.  Next visit: Wednesday, December 10, during chemotherapy.

## 2013-03-31 NOTE — Progress Notes (Signed)
Weekly Management Note Current Dose:24 Gy  Projected Dose:60 Gy   Narrative:  The patient presents for routine under treatment assessment.  CBCT/MVCT images/Port film x-rays were reviewed.  The chart was checked. Feeling better from a GI standpoint but left shoulder pain is now keeping him up.  Does not want to take narcotics. Would like to try ibuprofen or tylenol.   Physical Findings:  Unchanged  Vitals:  Filed Vitals:   03/31/13 1620  BP: 115/69  Pulse: 97  Temp: 97.7 F (36.5 C)   Weight:  Wt Readings from Last 3 Encounters:  03/30/13 233 lb 6.4 oz (105.87 kg)  03/29/13 234 lb 9.6 oz (106.414 kg)  03/23/13 236 lb 9.6 oz (107.321 kg)   Lab Results  Component Value Date   WBC 8.4 03/30/2013   HGB 11.5* 03/30/2013   HCT 32.4* 03/30/2013   MCV 91.5 03/30/2013   PLT 293 03/30/2013   Lab Results  Component Value Date   CREATININE 1.4* 03/30/2013   BUN 22.4 03/30/2013   NA 138 03/30/2013   K 5.0 03/30/2013   CL 97 02/16/2013   CO2 26 03/30/2013     Impression:  The patient is tolerating radiation.  Plan:  Continue treatment as planned. Gave script for ultram to try.

## 2013-03-31 NOTE — Patient Instructions (Signed)
[facility] Discharge Instructions for Patients Receiving Chemotherapy  Today you received the following chemotherapy agents VP 16 To help prevent nausea and vomiting after your treatment, we encourage you to take your nausea medication as per Dr. Arbutus Ped. Compazine 10mg  every 6 hours as needed.  If you develop nausea and vomiting that is not controlled by your nausea medication, call the clinic.   BELOW ARE SYMPTOMS THAT SHOULD BE REPORTED IMMEDIATELY:  *FEVER GREATER THAN 100.5 F  *CHILLS WITH OR WITHOUT FEVER  NAUSEA AND VOMITING THAT IS NOT CONTROLLED WITH YOUR NAUSEA MEDICATION  *UNUSUAL SHORTNESS OF BREATH  *UNUSUAL BRUISING OR BLEEDING  TENDERNESS IN MOUTH AND THROAT WITH OR WITHOUT PRESENCE OF ULCERS  *URINARY PROBLEMS  *BOWEL PROBLEMS  UNUSUAL RASH Items with * indicate a potential emergency and should be followed up as soon as possible.  Feel free to call the clinic you have any questions or concerns. The clinic phone number is [phone].

## 2013-04-01 ENCOUNTER — Ambulatory Visit
Admission: RE | Admit: 2013-04-01 | Discharge: 2013-04-01 | Disposition: A | Discharge status: Home / Self Care | Payer: BC Managed Care – PPO | Source: Ambulatory Visit | Attending: Radiation Oncology | Admitting: Radiation Oncology

## 2013-04-01 ENCOUNTER — Ambulatory Visit (HOSPITAL_BASED_OUTPATIENT_CLINIC_OR_DEPARTMENT_OTHER): Payer: BC Managed Care – PPO

## 2013-04-01 VITALS — BP 125/85 | HR 90 | Temp 98.2°F | Resp 16

## 2013-04-01 DIAGNOSIS — Z5111 Encounter for antineoplastic chemotherapy: Secondary | ICD-10-CM

## 2013-04-01 DIAGNOSIS — C3430 Malignant neoplasm of lower lobe, unspecified bronchus or lung: Secondary | ICD-10-CM

## 2013-04-01 DIAGNOSIS — C3492 Malignant neoplasm of unspecified part of left bronchus or lung: Secondary | ICD-10-CM

## 2013-04-01 MED ORDER — ONDANSETRON 8 MG/50ML IVPB (CHCC)
8.0000 mg | Freq: Once | INTRAVENOUS | Status: AC
  Administered 2013-04-01: 8 mg via INTRAVENOUS

## 2013-04-01 MED ORDER — DEXAMETHASONE SODIUM PHOSPHATE 10 MG/ML IJ SOLN
INTRAMUSCULAR | Status: AC
  Filled 2013-04-01: qty 1

## 2013-04-01 MED ORDER — SODIUM CHLORIDE 0.9 % IV SOLN
120.0000 mg/m2 | Freq: Once | INTRAVENOUS | Status: AC
  Administered 2013-04-01: 270 mg via INTRAVENOUS
  Filled 2013-04-01: qty 13.5

## 2013-04-01 MED ORDER — ONDANSETRON 8 MG/NS 50 ML IVPB
INTRAVENOUS | Status: AC
  Filled 2013-04-01: qty 8

## 2013-04-01 MED ORDER — DEXAMETHASONE SODIUM PHOSPHATE 10 MG/ML IJ SOLN
10.0000 mg | Freq: Once | INTRAMUSCULAR | Status: AC
  Administered 2013-04-01: 10 mg via INTRAVENOUS

## 2013-04-01 MED ORDER — SODIUM CHLORIDE 0.9 % IV SOLN
Freq: Once | INTRAVENOUS | Status: AC
  Administered 2013-04-01: 12:00:00 via INTRAVENOUS

## 2013-04-01 NOTE — Patient Instructions (Signed)
[facility] Discharge Instructions for Patients Receiving Chemotherapy  Today you received the following chemotherapy agents VP-16.  To help prevent nausea and vomiting after your treatment, we encourage you to take your nausea medication compazine.   If you develop nausea and vomiting that is not controlled by your nausea medication, call the clinic.   BELOW ARE SYMPTOMS THAT SHOULD BE REPORTED IMMEDIATELY:  *FEVER GREATER THAN 100.5 F  *CHILLS WITH OR WITHOUT FEVER  NAUSEA AND VOMITING THAT IS NOT CONTROLLED WITH YOUR NAUSEA MEDICATION  *UNUSUAL SHORTNESS OF BREATH  *UNUSUAL BRUISING OR BLEEDING  TENDERNESS IN MOUTH AND THROAT WITH OR WITHOUT PRESENCE OF ULCERS  *URINARY PROBLEMS  *BOWEL PROBLEMS  UNUSUAL RASH Items with * indicate a potential emergency and should be followed up as soon as possible.  Feel free to call the clinic you have any questions or concerns. The clinic phone number is [phone].

## 2013-04-01 NOTE — Progress Notes (Signed)
No changes in patients assessment except for hiccups that just started this am.  Patient is using OTC gaviscon.

## 2013-04-02 ENCOUNTER — Ambulatory Visit (HOSPITAL_BASED_OUTPATIENT_CLINIC_OR_DEPARTMENT_OTHER): Payer: BC Managed Care – PPO

## 2013-04-02 VITALS — BP 111/71 | HR 82 | Temp 97.6°F | Resp 18

## 2013-04-02 DIAGNOSIS — Z5189 Encounter for other specified aftercare: Secondary | ICD-10-CM

## 2013-04-02 DIAGNOSIS — C3492 Malignant neoplasm of unspecified part of left bronchus or lung: Secondary | ICD-10-CM

## 2013-04-02 DIAGNOSIS — C3430 Malignant neoplasm of lower lobe, unspecified bronchus or lung: Secondary | ICD-10-CM

## 2013-04-02 MED ORDER — PEGFILGRASTIM INJECTION 6 MG/0.6ML
6.0000 mg | Freq: Once | SUBCUTANEOUS | Status: AC
  Administered 2013-04-02: 6 mg via SUBCUTANEOUS

## 2013-04-03 ENCOUNTER — Ambulatory Visit
Admission: RE | Admit: 2013-04-03 | Discharge: 2013-04-03 | Disposition: A | Discharge status: Home / Self Care | Payer: BC Managed Care – PPO | Source: Ambulatory Visit | Attending: Radiation Oncology | Admitting: Radiation Oncology

## 2013-04-04 ENCOUNTER — Ambulatory Visit
Admission: RE | Admit: 2013-04-04 | Discharge: 2013-04-04 | Disposition: A | Discharge status: Home / Self Care | Payer: BC Managed Care – PPO | Source: Ambulatory Visit | Attending: Radiation Oncology | Admitting: Radiation Oncology

## 2013-04-05 ENCOUNTER — Ambulatory Visit
Admission: RE | Admit: 2013-04-05 | Discharge: 2013-04-05 | Disposition: A | Discharge status: Home / Self Care | Payer: BC Managed Care – PPO | Source: Ambulatory Visit | Attending: Radiation Oncology | Admitting: Radiation Oncology

## 2013-04-05 DIAGNOSIS — C3492 Malignant neoplasm of unspecified part of left bronchus or lung: Secondary | ICD-10-CM

## 2013-04-05 MED ORDER — HYDROCODONE-ACETAMINOPHEN 7.5-325 MG/15ML PO SOLN: 10.0000 mL | Freq: Four times a day (QID) | ORAL | Status: DC | PRN

## 2013-04-05 MED ORDER — SUCRALFATE 1 GM/10ML PO SUSP: 1.0000 g | Freq: Three times a day (TID) | ORAL | Status: DC

## 2013-04-05 NOTE — Progress Notes (Signed)
Weekly Management Note Current Dose: 32 Gy  Projected Dose: 30 Gy   Narrative:  The patient presents for routine under treatment assessment.  CBCT/MVCT images/Port film x-rays were reviewed.  The chart was checked. Dysphagia worse. Not taking tramadol. Also complains of "burning flesh" type odor to urine. No fever, chills, frequency or dysuria.  No chemo this week. Hiccups started. Using gaviscon.   Physical Findings:  Unchanged  Vitals: There were no vitals filed for this visit. Weight:  Wt Readings from Last 3 Encounters:  03/30/13 233 lb 6.4 oz (105.87 kg)  03/29/13 234 lb 9.6 oz (106.414 kg)  03/23/13 236 lb 9.6 oz (107.321 kg)   Lab Results  Component Value Date   WBC 8.4 03/30/2013   HGB 11.5* 03/30/2013   HCT 32.4* 03/30/2013   MCV 91.5 03/30/2013   PLT 293 03/30/2013   Lab Results  Component Value Date   CREATININE 1.4* 03/30/2013   BUN 22.4 03/30/2013   NA 138 03/30/2013   K 5.0 03/30/2013   CL 97 02/16/2013   CO2 26 03/30/2013     Impression:  The patient is tolerating radiation.  Plan:  Continue treatment as planned. Add carafate and hycet if tramadol doesn't work. Encouraged him to increase fluids. Monitor urine for now.

## 2013-04-06 ENCOUNTER — Ambulatory Visit
Admission: RE | Admit: 2013-04-06 | Discharge: 2013-04-06 | Disposition: A | Discharge status: Home / Self Care | Payer: BC Managed Care – PPO | Source: Ambulatory Visit | Attending: Radiation Oncology | Admitting: Radiation Oncology

## 2013-04-06 ENCOUNTER — Other Ambulatory Visit: Payer: BC Managed Care – PPO | Admitting: Lab

## 2013-04-06 ENCOUNTER — Encounter (INDEPENDENT_AMBULATORY_CARE_PROVIDER_SITE_OTHER): Payer: Self-pay

## 2013-04-06 ENCOUNTER — Other Ambulatory Visit (HOSPITAL_BASED_OUTPATIENT_CLINIC_OR_DEPARTMENT_OTHER): Payer: BC Managed Care – PPO | Admitting: Lab

## 2013-04-06 DIAGNOSIS — C3492 Malignant neoplasm of unspecified part of left bronchus or lung: Secondary | ICD-10-CM

## 2013-04-06 DIAGNOSIS — C3430 Malignant neoplasm of lower lobe, unspecified bronchus or lung: Secondary | ICD-10-CM

## 2013-04-06 LAB — COMPREHENSIVE METABOLIC PANEL (CC13)
AST: 11 U/L (ref 5–34)
Alkaline Phosphatase: 127 U/L (ref 40–150)
Anion Gap: 10 mEq/L (ref 3–11)
BUN: 36.8 mg/dL — ABNORMAL HIGH (ref 7.0–26.0)
Creatinine: 1.4 mg/dL — ABNORMAL HIGH (ref 0.7–1.3)
Glucose: 191 mg/dl — ABNORMAL HIGH (ref 70–140)
Potassium: 4.8 mEq/L (ref 3.5–5.1)
Total Bilirubin: 0.77 mg/dL (ref 0.20–1.20)

## 2013-04-06 LAB — CBC WITH DIFFERENTIAL/PLATELET
Basophils Absolute: 0 10*3/uL (ref 0.0–0.1)
EOS%: 2 % (ref 0.0–7.0)
Eosinophils Absolute: 0 10*3/uL (ref 0.0–0.5)
HGB: 11.2 g/dL — ABNORMAL LOW (ref 13.0–17.1)
LYMPH%: 12.5 % — ABNORMAL LOW (ref 14.0–49.0)
MCH: 31.3 pg (ref 27.2–33.4)
MCV: 92.2 fL (ref 79.3–98.0)
MONO%: 4.7 % (ref 0.0–14.0)
NEUT%: 80.1 % — ABNORMAL HIGH (ref 39.0–75.0)
Platelets: 191 10*3/uL (ref 140–400)
RDW: 13.5 % (ref 11.0–14.6)
lymph#: 0.2 10*3/uL — ABNORMAL LOW (ref 0.9–3.3)

## 2013-04-09 ENCOUNTER — Other Ambulatory Visit: Payer: Self-pay | Admitting: Oncology

## 2013-04-11 ENCOUNTER — Ambulatory Visit (HOSPITAL_BASED_OUTPATIENT_CLINIC_OR_DEPARTMENT_OTHER): Payer: BC Managed Care – PPO

## 2013-04-11 ENCOUNTER — Ambulatory Visit
Admission: RE | Admit: 2013-04-11 | Discharge: 2013-04-11 | Disposition: A | Discharge status: Home / Self Care | Payer: BC Managed Care – PPO | Source: Ambulatory Visit | Attending: Radiation Oncology | Admitting: Radiation Oncology

## 2013-04-11 VITALS — BP 88/62 | HR 106 | Temp 98.1°F

## 2013-04-11 VITALS — BP 103/67 | HR 103 | Temp 98.2°F | Resp 20

## 2013-04-11 DIAGNOSIS — R112 Nausea with vomiting, unspecified: Secondary | ICD-10-CM | POA: Insufficient documentation

## 2013-04-11 DIAGNOSIS — C3492 Malignant neoplasm of unspecified part of left bronchus or lung: Secondary | ICD-10-CM

## 2013-04-11 DIAGNOSIS — R1013 Epigastric pain: Secondary | ICD-10-CM

## 2013-04-11 DIAGNOSIS — R42 Dizziness and giddiness: Secondary | ICD-10-CM | POA: Insufficient documentation

## 2013-04-11 DIAGNOSIS — Z79899 Other long term (current) drug therapy: Secondary | ICD-10-CM | POA: Insufficient documentation

## 2013-04-11 DIAGNOSIS — C3490 Malignant neoplasm of unspecified part of unspecified bronchus or lung: Secondary | ICD-10-CM | POA: Insufficient documentation

## 2013-04-11 LAB — CBC WITH DIFFERENTIAL/PLATELET
BASO%: 1.2 % (ref 0.0–2.0)
Basophils Absolute: 0 10*3/uL (ref 0.0–0.1)
Eosinophils Absolute: 0.1 10*3/uL (ref 0.0–0.5)
HCT: 29.5 % — ABNORMAL LOW (ref 38.4–49.9)
HGB: 10.6 g/dL — ABNORMAL LOW (ref 13.0–17.1)
LYMPH%: 11.7 % — ABNORMAL LOW (ref 14.0–49.0)
MCHC: 35.9 g/dL (ref 32.0–36.0)
MONO#: 0.7 10*3/uL (ref 0.1–0.9)
NEUT%: 64.1 % (ref 39.0–75.0)
Platelets: 27 10*3/uL — ABNORMAL LOW (ref 140–400)
RBC: 3.41 10*6/uL — ABNORMAL LOW (ref 4.20–5.82)
WBC: 3.4 10*3/uL — ABNORMAL LOW (ref 4.0–10.3)
nRBC: 0 % (ref 0–0)

## 2013-04-11 LAB — BASIC METABOLIC PANEL (CC13)
BUN: 26.9 mg/dL — ABNORMAL HIGH (ref 7.0–26.0)
CO2: 25 mEq/L (ref 22–29)
Chloride: 98 mEq/L (ref 98–109)
Creatinine: 1.7 mg/dL — ABNORMAL HIGH (ref 0.7–1.3)
Glucose: 219 mg/dl — ABNORMAL HIGH (ref 70–140)
Potassium: 4.2 mEq/L (ref 3.5–5.1)
Sodium: 135 mEq/L — ABNORMAL LOW (ref 136–145)

## 2013-04-11 MED ORDER — MORPHINE SULFATE 4 MG/ML IJ SOLN
4.0000 mg | Freq: Once | INTRAMUSCULAR | Status: AC
  Administered 2013-04-11: 4 mg via INTRAVENOUS

## 2013-04-11 MED ORDER — ONDANSETRON 8 MG/NS 50 ML IVPB
INTRAVENOUS | Status: AC
  Filled 2013-04-11: qty 8

## 2013-04-11 MED ORDER — MORPHINE SULFATE 4 MG/ML IJ SOLN
INTRAMUSCULAR | Status: AC
  Filled 2013-04-11: qty 1

## 2013-04-11 MED ORDER — SODIUM CHLORIDE 0.9 % IV SOLN
INTRAVENOUS | Status: DC
  Administered 2013-04-11: 11:00:00 via INTRAVENOUS

## 2013-04-11 MED ORDER — SODIUM CHLORIDE 0.9 % IV SOLN
8.0000 mg | Freq: Once | INTRAVENOUS | Status: AC
  Administered 2013-04-11: 8 mg via INTRAVENOUS
  Filled 2013-04-11: qty 4

## 2013-04-11 NOTE — Progress Notes (Signed)
Pt started c/o pain in his throat.  Per Dr Roselind Messier okay to give another 4mg  morphine IV.  SLJ

## 2013-04-11 NOTE — Progress Notes (Signed)
Patient here with uncontrolled pain and and nausea and vomiting over the last 3 days.Started phenergan suppositories over the week-end which has not helped much.Unable to keep anything down on stomach as well as take pain medication for mid-epigastric pain.Blood pressure low 88/62 p 106.sitting and 81/5o p 113 standing.

## 2013-04-11 NOTE — Progress Notes (Signed)
[facility]    Radiation Oncology [address] [place]     Maryln Gottron, M.D. [place], [place] [address]               Billie Lade, M.D., Ph.D. Phone: [phone]      Molli Hazard A. Kathrynn Running, M.D. Fax: [phone]      Radene Gunning, M.D., Ph.D.         Lurline Hare, M.D.         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  Current Dose: 36 Gy  Current Fraction: 18  Planned Dose: 60 Gy  Narrative: [name] was seen today for weekly treatment management. The chart was checked and CBCT  were reviewed. The patient asked to be seen today for uncontrollable pain in the epigastric region, nausea and vomiting over the past 3 days. Patient has been unable to keep solids or liquids down. He was phoned in a prescription for Phenergan suppositories over the weekend which has not helped much. Patient does have dizziness with standing.  Erythromycin and Penicillins Current Outpatient Prescriptions  Medication Sig Dispense Refill  . albuterol (PROAIR HFA) 108 (90 BASE) MCG/ACT inhaler Inhale 2 puffs into the lungs every 6 (six) hours as needed for wheezing or shortness of breath.      Marland Kitchen albuterol (PROVENTIL) (2.5 MG/3ML) 0.083% nebulizer solution Take 3 mLs (2.5 mg total) by nebulization 5 (five) times daily as needed for wheezing or shortness of breath.  75 mL  12  . amLODipine (NORVASC) 2.5 MG tablet Take 2.5 mg by mouth daily.      Marland Kitchen aspirin EC 81 MG tablet Take 81 mg by mouth daily.      Marland Kitchen atorvastatin (LIPITOR) 40 MG tablet Take 40 mg by mouth at bedtime.      . bisoprolol (ZEBETA) 5 MG tablet Take 5 mg by mouth daily.      . clopidogrel (PLAVIX) 75 MG tablet Take 1 tablet (75 mg total) by mouth daily. Resume once  cleared by the lung doctor after bronchoscopy and lung biopsy      . Fluticasone Furoate-Vilanterol (BREO ELLIPTA) 100-25 MCG/INH AEPB Inhale 1 puff into the lungs 2 (two) times daily.  28 each  1  . glipiZIDE (GLUCOTROL) 10 MG tablet Take 10 mg by mouth 2 (two) times daily before a meal.       . HYDROcodone-acetaminophen (HYCET) 7.5-325 mg/15 ml solution Take 10 mLs by mouth 4 (four) times daily as needed for moderate pain.  473 mL  0  . isosorbide mononitrate (IMDUR) 60 MG 24 hr tablet Take 1 tablet (60 mg total) by mouth daily.  30 tablet  8  . losartan (COZAAR) 50 MG tablet Take 1 tablet (50 mg total) by mouth daily.  30 tablet  8  . metFORMIN (GLUCOPHAGE-XR) 750 MG 24 hr tablet Take 1,500 mg by mouth daily with breakfast.      . nitroGLYCERIN (NITROSTAT) 0.4 MG SL tablet Place 0.4 mg under the tongue every 5 (five) minutes as needed for chest pain. May repeat up to 3 doses      . prochlorperazine (  COMPAZINE) 10 MG tablet Take 1 tablet (10 mg total) by mouth every 6 (six) hours as needed.  30 tablet  0  . sucralfate (CARAFATE) 1 GM/10ML suspension Take 10 mLs (1 g total) by mouth 4 (four) times daily -  with meals and at bedtime.  420 mL  0  . traMADol (ULTRAM) 50 MG tablet Take 1 tablet (50 mg total) by mouth every 6 (six) hours as needed.  30 tablet  1  . Wound Cleansers (RADIAPLEX EX) Apply topically.       No current facility-administered medications for this encounter.   Labs:  Lab Results  Component Value Date   WBC 1.9* 04/06/2013   HGB 11.2* 04/06/2013   HCT 33.0* 04/06/2013   MCV 92.2 04/06/2013   PLT 191 04/06/2013   Lab Results  Component Value Date   CREATININE 1.4* 04/06/2013   BUN 36.8* 04/06/2013   NA 135* 04/06/2013   K 4.8 04/06/2013   CL 97 02/16/2013   CO2 24 04/06/2013   Lab Results  Component Value Date   ALT 18 04/06/2013   AST 11 04/06/2013   PHOS 4.0 02/16/2013   BILITOT 0.77 04/06/2013    Physical Examination:  Filed Vitals:   04/11/13 0908  BP:  88/62  Pulse: 106  Temp: 98.1 F (36.7 C)    Wt Readings from Last 3 Encounters:  03/30/13 233 lb 6.4 oz (105.87 kg)  03/29/13 234 lb 9.6 oz (106.414 kg)  03/23/13 236 lb 9.6 oz (107.321 kg)    The oral cavity is somewhat dry without secondary infection. Lungs - Normal respiratory effort, chest expands symmetrically. Lungs are clear to auscultation, no crackles or wheezes.  Heart has regular rhythm with increased rate  Abdomen is soft and non tender with decreased bowel sounds  Assessment:  Patient having significant side effects with this treatment.  Plan: Stat blood work. He will be set up for IV fluids, IV nausea medicine an IV pain medicine.  He may need a break in treatment.

## 2013-04-11 NOTE — Patient Instructions (Signed)
Dehydration, Adult Dehydration is when you lose more fluids from the body than you take in. Vital organs like the kidneys, brain, and heart cannot function without a proper amount of fluids and salt. Any loss of fluids from the body can cause dehydration.  CAUSES   Vomiting.  Diarrhea.  Excessive sweating.  Excessive urine output.  Fever. SYMPTOMS  Mild dehydration  Thirst.  Dry lips.  Slightly dry mouth. Moderate dehydration  Very dry mouth.  Sunken eyes.  Skin does not bounce back quickly when lightly pinched and released.  Dark urine and decreased urine production.  Decreased tear production.  Headache. Severe dehydration  Very dry mouth.  Extreme thirst.  Rapid, weak pulse (more than 100 beats per minute at rest).  Cold hands and feet.  Not able to sweat in spite of heat and temperature.  Rapid breathing.  Blue lips.  Confusion and lethargy.  Difficulty being awakened.  Minimal urine production.  No tears. DIAGNOSIS  Your caregiver will diagnose dehydration based on your symptoms and your exam. Blood and urine tests will help confirm the diagnosis. The diagnostic evaluation should also identify the cause of dehydration. TREATMENT  Treatment of mild or moderate dehydration can often be done at home by increasing the amount of fluids that you drink. It is best to drink small amounts of fluid more often. Drinking too much at one time can make vomiting worse. Refer to the home care instructions below. Severe dehydration needs to be treated at the hospital where you will probably be given intravenous (IV) fluids that contain water and electrolytes. HOME CARE INSTRUCTIONS   Ask your caregiver about specific rehydration instructions.  Drink enough fluids to keep your urine clear or pale yellow.  Drink small amounts frequently if you have nausea and vomiting.  Eat as you normally do.  Avoid:  Foods or drinks high in sugar.  Carbonated  drinks.  Juice.  Extremely hot or cold fluids.  Drinks with caffeine.  Fatty, greasy foods.  Alcohol.  Tobacco.  Overeating.  Gelatin desserts.  Wash your hands well to avoid spreading bacteria and viruses.  Only take over-the-counter or prescription medicines for pain, discomfort, or fever as directed by your caregiver.  Ask your caregiver if you should continue all prescribed and over-the-counter medicines.  Keep all follow-up appointments with your caregiver. SEEK MEDICAL CARE IF:  You have abdominal pain and it increases or stays in one area (localizes).  You have a rash, stiff neck, or severe headache.  You are irritable, sleepy, or difficult to awaken.  You are weak, dizzy, or extremely thirsty. SEEK IMMEDIATE MEDICAL CARE IF:   You are unable to keep fluids down or you get worse despite treatment.  You have frequent episodes of vomiting or diarrhea.  You have blood or green matter (bile) in your vomit.  You have blood in your stool or your stool looks black and tarry.  You have not urinated in 6 to 8 hours, or you have only urinated a small amount of very dark urine.  You have a fever.  You faint. MAKE SURE YOU:   Understand these instructions.  Will watch your condition.  Will get help right away if you are not doing well or get worse. Document Released: 04/28/2005 Document Revised: 07/21/2011 Document Reviewed: 12/16/2010 [employer] Patient Information 2014 [employer].  

## 2013-04-12 ENCOUNTER — Ambulatory Visit: Admission: RE | Admit: 2013-04-12 | Payer: BC Managed Care – PPO | Source: Ambulatory Visit

## 2013-04-12 ENCOUNTER — Encounter: Payer: Self-pay | Admitting: Radiation Oncology

## 2013-04-12 ENCOUNTER — Telehealth: Payer: Self-pay

## 2013-04-12 ENCOUNTER — Ambulatory Visit
Admission: RE | Admit: 2013-04-12 | Discharge: 2013-04-12 | Disposition: A | Discharge status: Home / Self Care | Payer: BC Managed Care – PPO | Source: Ambulatory Visit | Attending: Radiation Oncology | Admitting: Radiation Oncology

## 2013-04-12 VITALS — BP 104/73 | HR 109 | Temp 98.4°F | Resp 20 | Wt 221.7 lb

## 2013-04-12 DIAGNOSIS — C3432 Malignant neoplasm of lower lobe, left bronchus or lung: Secondary | ICD-10-CM

## 2013-04-12 DIAGNOSIS — C3492 Malignant neoplasm of unspecified part of left bronchus or lung: Secondary | ICD-10-CM

## 2013-04-12 MED ORDER — FENTANYL 25 MCG/HR TD PT72: 25.0000 ug | MEDICATED_PATCH | TRANSDERMAL | Status: DC

## 2013-04-12 MED ORDER — MAGIC MOUTHWASH W/LIDOCAINE: 5.0000 mL | Freq: Four times a day (QID) | ORAL | Status: DC | PRN

## 2013-04-12 MED ORDER — PROMETHAZINE HCL 25 MG RE SUPP: 25.0000 mg | Freq: Four times a day (QID) | RECTAL | Status: DC | PRN

## 2013-04-12 MED ORDER — MORPHINE SULFATE 4 MG/ML IJ SOLN
4.0000 mg | Freq: Once | INTRAMUSCULAR | Status: AC
  Administered 2013-04-12: 4 mg via INTRAMUSCULAR
  Filled 2013-04-12: qty 1

## 2013-04-12 MED ORDER — MORPHINE SULFATE 4 MG/ML IJ SOLN
4.0000 mg | Freq: Once | INTRAMUSCULAR | Status: DC
  Filled 2013-04-12: qty 1

## 2013-04-12 MED ORDER — ONDANSETRON 4 MG PO TBDP: 4.0000 mg | ORAL_TABLET | Freq: Three times a day (TID) | ORAL | Status: DC | PRN

## 2013-04-12 NOTE — Progress Notes (Signed)
9 am Morphine 4 mg IM given in left deltoid per Dr Luciano Cutter order. Pt will return at 12:20 pm for treatment. He was placed on Duragesic patch, nausea and pain meds refilled. Pt will receive IVF Thurs and see Dr Tinnie Gens for FU. Pt and wife states that when pt gets good pain relief from Morphine he will eat and drink as much as possible this morning.  Will update Elnita Maxwell, RN for Dr Michell Heinrich.

## 2013-04-12 NOTE — Addendum Note (Signed)
Encounter addended by: Glennie Hawk, RN on: 04/12/2013  9:22 AM<BR>     Documentation filed: Inpatient MAR

## 2013-04-12 NOTE — Progress Notes (Signed)
Weekly Management Note Current Dose:36 Gy  Projected Dose:60 Gy   Narrative:  The patient presents for routine under treatment assessment.  CBCT/MVCT images/Port film x-rays were reviewed.  The chart was checked. Still c/o of pain with swallowing anything including carafate. hycet only works for a few minutes then leaves. Wants something stronger. Nausea is improved with phenergan. Would like something po rather than pr if possible.   Physical Findings:  In pain in a wheelchair. Oriented.   Vitals:  Filed Vitals:   04/12/13 0837  BP: 104/73  Pulse: 109  Temp: 98.4 F (36.9 C)  Resp: 20   Weight:  Wt Readings from Last 3 Encounters:  04/12/13 221 lb 11.2 oz (100.562 kg)  03/30/13 233 lb 6.4 oz (105.87 kg)  03/29/13 234 lb 9.6 oz (106.414 kg)   Lab Results  Component Value Date   WBC 3.4* 04/11/2013   HGB 10.6* 04/11/2013   HCT 29.5* 04/11/2013   MCV 86.5 04/11/2013   PLT 27* 04/11/2013   Lab Results  Component Value Date   CREATININE 1.7* 04/11/2013   BUN 26.9* 04/11/2013   NA 135* 04/11/2013   K 4.2 04/11/2013   CL 97 02/16/2013   CO2 25 04/11/2013     Impression:  The patient is tolerating radiation.  Plan:  Continue treatment as planned. Add duragesic patch and mmw with lidocaine. Switch to zofran odt and refilled phenergan suppositories if needed. IVF scheduled for Thursday and I need to see him again then as well. Encouraged pos. Offered to take today off but would like to get treatment and continue.

## 2013-04-12 NOTE — Progress Notes (Addendum)
Pt states he felt better yesterday after receiving IVF, Zofran, Morphine IV, but he states his throat still hurts too much to swallow. He is taking Hycet regularly w/short term relief, states Carafate gives 10 min of relief. He states he tries to drink/eat after taking pain meds, but states he "literally cannot swallow even water". Pt using Phenergan suppositories every 12 hrs w/good relief of nausea, needs refill today. Pt denies skin irritation in treatment area. He is fatigued. Pt uses O2 @ 2L/min w/activities, exertion.

## 2013-04-12 NOTE — Telephone Encounter (Signed)
Spoke with patient's sister and informed IVF scheduled for Thursday at  8:30 am.

## 2013-04-13 ENCOUNTER — Other Ambulatory Visit: Payer: BC Managed Care – PPO | Admitting: Lab

## 2013-04-13 ENCOUNTER — Ambulatory Visit
Admission: RE | Admit: 2013-04-13 | Discharge: 2013-04-13 | Disposition: A | Discharge status: Home / Self Care | Payer: BC Managed Care – PPO | Source: Ambulatory Visit | Attending: Radiation Oncology | Admitting: Radiation Oncology

## 2013-04-14 ENCOUNTER — Ambulatory Visit
Admission: RE | Admit: 2013-04-14 | Payer: BC Managed Care – PPO | Source: Ambulatory Visit | Admitting: Radiation Oncology

## 2013-04-14 ENCOUNTER — Ambulatory Visit
Admission: RE | Admit: 2013-04-14 | Discharge: 2013-04-14 | Disposition: A | Discharge status: Home / Self Care | Payer: BC Managed Care – PPO | Source: Ambulatory Visit | Attending: Radiation Oncology | Admitting: Radiation Oncology

## 2013-04-14 ENCOUNTER — Ambulatory Visit (HOSPITAL_BASED_OUTPATIENT_CLINIC_OR_DEPARTMENT_OTHER): Payer: BC Managed Care – PPO

## 2013-04-14 VITALS — BP 101/60 | HR 89 | Temp 97.3°F | Resp 17

## 2013-04-14 DIAGNOSIS — Z5189 Encounter for other specified aftercare: Secondary | ICD-10-CM

## 2013-04-14 DIAGNOSIS — C3492 Malignant neoplasm of unspecified part of left bronchus or lung: Secondary | ICD-10-CM

## 2013-04-14 DIAGNOSIS — C3430 Malignant neoplasm of lower lobe, unspecified bronchus or lung: Secondary | ICD-10-CM

## 2013-04-14 MED ORDER — SODIUM CHLORIDE 0.9 % IV SOLN
Freq: Once | INTRAVENOUS | Status: AC
  Administered 2013-04-14: 09:00:00 via INTRAVENOUS

## 2013-04-14 NOTE — Patient Instructions (Signed)
Dehydration, Adult Dehydration is when you lose more fluids from the body than you take in. Vital organs like the kidneys, brain, and heart cannot function without a proper amount of fluids and salt. Any loss of fluids from the body can cause dehydration.  CAUSES   Vomiting.  Diarrhea.  Excessive sweating.  Excessive urine output.  Fever. SYMPTOMS  Mild dehydration  Thirst.  Dry lips.  Slightly dry mouth. Moderate dehydration  Very dry mouth.  Sunken eyes.  Skin does not bounce back quickly when lightly pinched and released.  Dark urine and decreased urine production.  Decreased tear production.  Headache. Severe dehydration  Very dry mouth.  Extreme thirst.  Rapid, weak pulse (more than 100 beats per minute at rest).  Cold hands and feet.  Not able to sweat in spite of heat and temperature.  Rapid breathing.  Blue lips.  Confusion and lethargy.  Difficulty being awakened.  Minimal urine production.  No tears. DIAGNOSIS  Your caregiver will diagnose dehydration based on your symptoms and your exam. Blood and urine tests will help confirm the diagnosis. The diagnostic evaluation should also identify the cause of dehydration. TREATMENT  Treatment of mild or moderate dehydration can often be done at home by increasing the amount of fluids that you drink. It is best to drink small amounts of fluid more often. Drinking too much at one time can make vomiting worse. Refer to the home care instructions below. Severe dehydration needs to be treated at the hospital where you will probably be given intravenous (IV) fluids that contain water and electrolytes. HOME CARE INSTRUCTIONS   Ask your caregiver about specific rehydration instructions.  Drink enough fluids to keep your urine clear or pale yellow.  Drink small amounts frequently if you have nausea and vomiting.  Eat as you normally do.  Avoid:  Foods or drinks high in sugar.  Carbonated  drinks.  Juice.  Extremely hot or cold fluids.  Drinks with caffeine.  Fatty, greasy foods.  Alcohol.  Tobacco.  Overeating.  Gelatin desserts.  Wash your hands well to avoid spreading bacteria and viruses.  Only take over-the-counter or prescription medicines for pain, discomfort, or fever as directed by your caregiver.  Ask your caregiver if you should continue all prescribed and over-the-counter medicines.  Keep all follow-up appointments with your caregiver. SEEK MEDICAL CARE IF:  You have abdominal pain and it increases or stays in one area (localizes).  You have a rash, stiff neck, or severe headache.  You are irritable, sleepy, or difficult to awaken.  You are weak, dizzy, or extremely thirsty. SEEK IMMEDIATE MEDICAL CARE IF:   You are unable to keep fluids down or you get worse despite treatment.  You have frequent episodes of vomiting or diarrhea.  You have blood or green matter (bile) in your vomit.  You have blood in your stool or your stool looks black and tarry.  You have not urinated in 6 to 8 hours, or you have only urinated a small amount of very dark urine.  You have a fever.  You faint. MAKE SURE YOU:   Understand these instructions.  Will watch your condition.  Will get help right away if you are not doing well or get worse. Document Released: 04/28/2005 Document Revised: 07/21/2011 Document Reviewed: 12/16/2010 [employer] Patient Information 2014 [employer].  

## 2013-04-15 ENCOUNTER — Ambulatory Visit
Admission: RE | Admit: 2013-04-15 | Discharge: 2013-04-15 | Disposition: A | Discharge status: Home / Self Care | Payer: BC Managed Care – PPO | Source: Ambulatory Visit | Attending: Radiation Oncology | Admitting: Radiation Oncology

## 2013-04-18 ENCOUNTER — Ambulatory Visit
Admission: RE | Admit: 2013-04-18 | Discharge: 2013-04-18 | Disposition: A | Discharge status: Home / Self Care | Payer: BC Managed Care – PPO | Source: Ambulatory Visit | Attending: Radiation Oncology | Admitting: Radiation Oncology

## 2013-04-18 ENCOUNTER — Ambulatory Visit (HOSPITAL_COMMUNITY)
Admission: RE | Admit: 2013-04-18 | Discharge: 2013-04-18 | Disposition: A | Discharge status: Home / Self Care | Payer: BC Managed Care – PPO | Source: Ambulatory Visit | Attending: Physician Assistant | Admitting: Physician Assistant

## 2013-04-18 ENCOUNTER — Ambulatory Visit: Payer: BC Managed Care – PPO | Admitting: Internal Medicine

## 2013-04-18 ENCOUNTER — Encounter (HOSPITAL_COMMUNITY): Payer: Self-pay

## 2013-04-18 DIAGNOSIS — C3490 Malignant neoplasm of unspecified part of unspecified bronchus or lung: Secondary | ICD-10-CM | POA: Insufficient documentation

## 2013-04-18 DIAGNOSIS — R05 Cough: Secondary | ICD-10-CM | POA: Insufficient documentation

## 2013-04-18 DIAGNOSIS — Z923 Personal history of irradiation: Secondary | ICD-10-CM | POA: Insufficient documentation

## 2013-04-18 DIAGNOSIS — R599 Enlarged lymph nodes, unspecified: Secondary | ICD-10-CM | POA: Insufficient documentation

## 2013-04-18 DIAGNOSIS — R0602 Shortness of breath: Secondary | ICD-10-CM | POA: Insufficient documentation

## 2013-04-18 DIAGNOSIS — R059 Cough, unspecified: Secondary | ICD-10-CM | POA: Insufficient documentation

## 2013-04-18 DIAGNOSIS — R079 Chest pain, unspecified: Secondary | ICD-10-CM | POA: Insufficient documentation

## 2013-04-18 DIAGNOSIS — Z9221 Personal history of antineoplastic chemotherapy: Secondary | ICD-10-CM | POA: Insufficient documentation

## 2013-04-18 MED ORDER — IOHEXOL 300 MG/ML  SOLN
80.0000 mL | Freq: Once | INTRAMUSCULAR | Status: AC | PRN
  Administered 2013-04-18: 80 mL via INTRAVENOUS

## 2013-04-19 ENCOUNTER — Ambulatory Visit
Admission: RE | Admit: 2013-04-19 | Discharge: 2013-04-19 | Disposition: A | Discharge status: Home / Self Care | Payer: BC Managed Care – PPO | Source: Ambulatory Visit | Attending: Radiation Oncology | Admitting: Radiation Oncology

## 2013-04-20 ENCOUNTER — Ambulatory Visit: Payer: BC Managed Care – PPO | Admitting: Nutrition

## 2013-04-20 ENCOUNTER — Telehealth: Payer: Self-pay | Admitting: Internal Medicine

## 2013-04-20 ENCOUNTER — Encounter: Payer: Self-pay | Admitting: Internal Medicine

## 2013-04-20 ENCOUNTER — Ambulatory Visit (HOSPITAL_BASED_OUTPATIENT_CLINIC_OR_DEPARTMENT_OTHER): Payer: BC Managed Care – PPO

## 2013-04-20 ENCOUNTER — Ambulatory Visit
Admission: RE | Admit: 2013-04-20 | Discharge: 2013-04-20 | Disposition: A | Discharge status: Home / Self Care | Payer: BC Managed Care – PPO | Source: Ambulatory Visit | Attending: Radiation Oncology | Admitting: Radiation Oncology

## 2013-04-20 ENCOUNTER — Other Ambulatory Visit: Payer: Self-pay | Admitting: Radiation Oncology

## 2013-04-20 ENCOUNTER — Telehealth: Payer: Self-pay | Admitting: *Deleted

## 2013-04-20 ENCOUNTER — Ambulatory Visit (HOSPITAL_BASED_OUTPATIENT_CLINIC_OR_DEPARTMENT_OTHER): Payer: BC Managed Care – PPO | Admitting: Internal Medicine

## 2013-04-20 ENCOUNTER — Other Ambulatory Visit (HOSPITAL_BASED_OUTPATIENT_CLINIC_OR_DEPARTMENT_OTHER): Payer: BC Managed Care – PPO

## 2013-04-20 VITALS — BP 91/61 | HR 88 | Temp 97.8°F | Resp 18 | Ht 70.0 in | Wt 225.0 lb

## 2013-04-20 DIAGNOSIS — C3492 Malignant neoplasm of unspecified part of left bronchus or lung: Secondary | ICD-10-CM

## 2013-04-20 DIAGNOSIS — R1310 Dysphagia, unspecified: Secondary | ICD-10-CM

## 2013-04-20 DIAGNOSIS — C3430 Malignant neoplasm of lower lobe, unspecified bronchus or lung: Secondary | ICD-10-CM

## 2013-04-20 DIAGNOSIS — R5381 Other malaise: Secondary | ICD-10-CM

## 2013-04-20 DIAGNOSIS — Z5111 Encounter for antineoplastic chemotherapy: Secondary | ICD-10-CM

## 2013-04-20 LAB — CBC WITH DIFFERENTIAL/PLATELET
BASO%: 0.2 % (ref 0.0–2.0)
Basophils Absolute: 0 10*3/uL (ref 0.0–0.1)
Eosinophils Absolute: 0.1 10*3/uL (ref 0.0–0.5)
HCT: 25 % — ABNORMAL LOW (ref 38.4–49.9)
LYMPH%: 4.4 % — ABNORMAL LOW (ref 14.0–49.0)
MCH: 32.6 pg (ref 27.2–33.4)
MCHC: 35 g/dL (ref 32.0–36.0)
MCV: 93.2 fL (ref 79.3–98.0)
MONO%: 10.5 % (ref 0.0–14.0)
NEUT#: 6.8 10*3/uL — ABNORMAL HIGH (ref 1.5–6.5)
NEUT%: 83.6 % — ABNORMAL HIGH (ref 39.0–75.0)
Platelets: 151 10*3/uL (ref 140–400)
RBC: 2.69 10*6/uL — ABNORMAL LOW (ref 4.20–5.82)

## 2013-04-20 LAB — COMPREHENSIVE METABOLIC PANEL (CC13)
ALT: 19 U/L (ref 0–55)
Alkaline Phosphatase: 109 U/L (ref 40–150)
Anion Gap: 13 mEq/L — ABNORMAL HIGH (ref 3–11)
Creatinine: 1.8 mg/dL — ABNORMAL HIGH (ref 0.7–1.3)
Glucose: 175 mg/dl — ABNORMAL HIGH (ref 70–140)
Sodium: 139 mEq/L (ref 136–145)
Total Bilirubin: 0.59 mg/dL (ref 0.20–1.20)
Total Protein: 6.6 g/dL (ref 6.4–8.3)

## 2013-04-20 MED ORDER — SODIUM CHLORIDE 0.9 % IV SOLN
120.0000 mg/m2 | Freq: Once | INTRAVENOUS | Status: AC
  Administered 2013-04-20: 270 mg via INTRAVENOUS
  Filled 2013-04-20: qty 13.5

## 2013-04-20 MED ORDER — HYDROCODONE-ACETAMINOPHEN 7.5-325 MG/15ML PO SOLN: 10.0000 mL | Freq: Four times a day (QID) | ORAL | Status: DC | PRN

## 2013-04-20 MED ORDER — DEXAMETHASONE SODIUM PHOSPHATE 20 MG/5ML IJ SOLN
INTRAMUSCULAR | Status: AC
  Filled 2013-04-20: qty 5

## 2013-04-20 MED ORDER — SODIUM CHLORIDE 0.9 % IV SOLN
483.0000 mg | Freq: Once | INTRAVENOUS | Status: AC
  Administered 2013-04-20: 480 mg via INTRAVENOUS
  Filled 2013-04-20: qty 48

## 2013-04-20 MED ORDER — TRAMADOL HCL 50 MG PO TABS: 50.0000 mg | ORAL_TABLET | Freq: Four times a day (QID) | ORAL | Status: DC | PRN

## 2013-04-20 MED ORDER — DEXAMETHASONE SODIUM PHOSPHATE 20 MG/5ML IJ SOLN
20.0000 mg | Freq: Once | INTRAMUSCULAR | Status: AC
  Administered 2013-04-20: 20 mg via INTRAVENOUS

## 2013-04-20 MED ORDER — ONDANSETRON 16 MG/50ML IVPB (CHCC)
16.0000 mg | Freq: Once | INTRAVENOUS | Status: AC
  Administered 2013-04-20: 16 mg via INTRAVENOUS

## 2013-04-20 MED ORDER — ONDANSETRON 16 MG/50ML IVPB (CHCC)
INTRAVENOUS | Status: AC
  Filled 2013-04-20: qty 16

## 2013-04-20 MED ORDER — SODIUM CHLORIDE 0.9 % IV SOLN
Freq: Once | INTRAVENOUS | Status: AC
  Administered 2013-04-20: 11:00:00 via INTRAVENOUS

## 2013-04-20 NOTE — Patient Instructions (Signed)
[facility] Discharge Instructions for Patients Receiving Chemotherapy  Today you received the following chemotherapy agents Carbo and VP-16  To help prevent nausea and vomiting after your treatment, we encourage you to take your nausea medication.   If you develop nausea and vomiting that is not controlled by your nausea medication, call the clinic.   BELOW ARE SYMPTOMS THAT SHOULD BE REPORTED IMMEDIATELY:  *FEVER GREATER THAN 100.5 F  *CHILLS WITH OR WITHOUT FEVER  NAUSEA AND VOMITING THAT IS NOT CONTROLLED WITH YOUR NAUSEA MEDICATION  *UNUSUAL SHORTNESS OF BREATH  *UNUSUAL BRUISING OR BLEEDING  TENDERNESS IN MOUTH AND THROAT WITH OR WITHOUT PRESENCE OF ULCERS  *URINARY PROBLEMS  *BOWEL PROBLEMS  UNUSUAL RASH Items with * indicate a potential emergency and should be followed up as soon as possible.  Feel free to call the clinic you have any questions or concerns. The clinic phone number is [phone].

## 2013-04-20 NOTE — Telephone Encounter (Signed)
3 wk ret OV w AJ sch per 12/10 POF Email to MW to add and adjust treatments AVS and CAL given shh

## 2013-04-20 NOTE — Progress Notes (Signed)
[facility]  [phone] Fax:([phone]) [phone] OFFICE VISIT PROGRESS NOTE  Clydell Hakim, MD [address] [place] [address]  DIAGNOSIS: Small cell carcinoma of lung   Primary site: Lung (Left)   Staging method: AJCC 7th Edition   Clinical: Stage IIIA (T2a, N2, M0)   Summary: Stage IIIA (T2a, N2, M0) Malignant neoplasm of lower lobe of left lung   Primary site: Lung (Left)   Staging method: AJCC 7th Edition   Summary: Incomplete stage    PRIOR THERAPY: None  CURRENT THERAPY: Systemic chemotherapy with carboplatin for an AUC of 5 given on day 1 and etoposide at 120 mg per meter squared given on days 1, 2 and 3 with Neulasta support given on day 4 given every 3 weeks. Status post 2 cycles.  DISEASE STAGE: Limited stage small cell lung cancer Small cell carcinoma of lung   Primary site: Lung (Left)   Staging method: AJCC 7th Edition   Clinical: Stage IIIA (T2a, N2, M0)   Summary: Stage IIIA (T2a, N2, M0) Malignant neoplasm of lower lobe of left lung   Primary site: Lung (Left)   Staging method: AJCC 7th Edition   Summary: Incomplete stage  CHEMOTHERAPY INTENT: Palliative  CURRENT # OF CHEMOTHERAPY CYCLES: 3  CURRENT ANTIEMETICS: Zofran, dexamethasone and Compazine  CURRENT SMOKING STATUS: Former smoker, quit 09/09/2009  ORAL CHEMOTHERAPY AND CONSENT: N./A  CURRENT BISPHOSPHONATES USE: None  PAIN MANAGEMENT: None  NARCOTICS INDUCED CONSTIPATION: None  LIVING WILL AND CODE STATUS: ?   INTERVAL HISTORY: [name] 57 y.o. male returns for followup visit accompanied by his niece. The patient is feeling fine today with no specific complaints except for mild odynophagia secondary to radiation treatment. He tolerated the second cycle of his systemic chemotherapy fairly well with no significant complaints except for mild fatigue. He denied having any fever or chills. He denied having any nausea or vomiting. The patient  has no chest pain, shortness of breath, cough or hemoptysis. He notes significant improvement in in the chest wheezes. He had repeat CT scan of the chest performed recently and he is here for evaluation and discussion of his scan results.  MEDICAL HISTORY: Past Medical History  Diagnosis Date  . CAD (coronary artery disease)     a. PCI 3/08 to OM2 and mid CFX; b.  NSTEMI 6/10:  Prior stents patent, 90% dCFX,  mRCA occl (old) with collats; EF 55% on LV-gram => Xience DES 2.5 x 23 mm to distal CFX;  c. abnl MV 12/13 => LHC 04/23/12:  pLAD 50%, oOM1 80%, long stented segment of the CFX extending into the PLOM with the PLOM totally occluded ostially, CFX 70% ISR, mCFX 80% ISR, pRCA chronically occluded. Med Rx planned  . Diabetes mellitus type II   . Hyperlipidemia   . COPD (chronic obstructive pulmonary disease)     Quit tobacco 09/2009; Gold Stage 2 with asthma component - FEV1 1.53 L/54%, 14% fev1 BD response, DLCO 54% - July 2011; MM genotype 01/07/10; unable to afford spiriva and unwilling to try ics due to prior renal failure: state to Dr. Marchelle Gearing, Aug 2011; started on atrovent HFA fall 2011. no desturation on walk test Dec 2011  . CKD (chronic kidney disease)   . Obesity   . History of CVA (cerebrovascular accident) 2007    Without residual defecits  . 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 no further rx  . CHF (congestive heart failure)   . Myocardial infarction     x3  . Hypertension   . Asthma     as child  . Shortness of breath     constant/wears 2 liters  . Pneumonia     02/14/2013 in hospital  . Stroke     7 years- no problems   . Cancer     lung cancer-02/14/2013    ALLERGIES:   is allergic to erythromycin and penicillins.  MEDICATIONS:  Current Outpatient Prescriptions  Medication Sig Dispense Refill  . albuterol (PROAIR HFA) 108 (90 BASE) MCG/ACT inhaler Inhale 2 puffs into the lungs every 6 (six) hours as needed for wheezing or shortness of breath.      . Alum & Mag Hydroxide-Simeth (MAGIC MOUTHWASH W/LIDOCAINE) SOLN Take 5 mLs by mouth 4 (four) times daily as needed for mouth pain.  480 mL  3  . aspirin EC 81 MG tablet Take 81 mg by mouth daily.      Marland Kitchen atorvastatin (LIPITOR) 40 MG tablet Take 40 mg by mouth at bedtime.      . clopidogrel (PLAVIX) 75 MG tablet Take 1 tablet (75 mg total) by mouth daily. Resume once cleared by the lung doctor after bronchoscopy and lung biopsy      . fentaNYL (DURAGESIC - DOSED MCG/HR) 25 MCG/HR patch Place 1 patch (25 mcg total) onto the skin every 3 (three) days.  5 patch  0  . Fluticasone Furoate-Vilanterol (BREO ELLIPTA) 100-25 MCG/INH AEPB Inhale 1 puff into the lungs 2 (two) times daily.  28 each  1  . glipiZIDE (GLUCOTROL) 10 MG tablet Take 10 mg by mouth 2 (two) times daily before a meal.       . HYDROcodone-acetaminophen (HYCET) 7.5-325 mg/15 ml solution Take 10 mLs by mouth 4 (four) times daily as needed for moderate pain.  473 mL  0  . isosorbide mononitrate (IMDUR) 60 MG 24 hr tablet Take 1 tablet (60 mg total) by mouth daily.  30 tablet  8  . ondansetron (ZOFRAN ODT) 4 MG disintegrating tablet Take 1 tablet (4 mg total) by mouth every 8 (eight) hours as needed for nausea or vomiting.  20 tablet  0  . promethazine (PHENERGAN) 25 MG suppository Place 1 suppository (25 mg total) rectally every 6 (six) hours as needed for nausea or vomiting.  12 each  0  . sucralfate (CARAFATE) 1 GM/10ML suspension Take 10 mLs (1 g total) by mouth 4 (four) times daily -  with meals and at bedtime.  420 mL  0  . Wound Cleansers (RADIAPLEX EX) Apply topically.      Marland Kitchen albuterol (PROVENTIL) (2.5 MG/3ML) 0.083% nebulizer solution Take 3 mLs (2.5 mg  total) by nebulization 5 (five) times daily as needed for wheezing or shortness of breath.  75 mL  12  . amLODipine (NORVASC) 2.5 MG tablet Take 2.5 mg by mouth daily.      . bisoprolol (ZEBETA) 5 MG tablet Take 5 mg by mouth daily.      Marland Kitchen losartan (COZAAR) 50 MG tablet Take 1 tablet (50 mg total) by mouth daily.  30 tablet  8  . metFORMIN (GLUCOPHAGE-XR) 750 MG 24 hr tablet Take 1,500 mg by mouth daily with breakfast.      .  nitroGLYCERIN (NITROSTAT) 0.4 MG SL tablet Place 0.4 mg under the tongue every 5 (five) minutes as needed for chest pain. May repeat up to 3 doses       No current facility-administered medications for this visit.    SURGICAL HISTORY:  Past Surgical History  Procedure Laterality Date  . Wedge resection  03/28/2010  . Coronary stent placement  2009  . Cardiac catheterization  2009  . Endobronchial ultrasound Bilateral 02/28/2013    Procedure: ENDOBRONCHIAL ULTRASOUND;  Surgeon: Kalman Shan, MD;  Location: [facility];  Service: Cardiopulmonary;  Laterality: Bilateral;    REVIEW OF SYSTEMS:  Constitutional: positive for fatigue Eyes: negative Ears, nose, mouth, throat, and face: positive for sore throat and Occasional white drainage from his right tonsillar area not associated with fever chills or foul-smelling secretions, long-standing issue Respiratory: positive for dyspnea on exertion Cardiovascular: negative Gastrointestinal: negative Genitourinary:negative Integument/breast: negative Hematologic/lymphatic: negative Musculoskeletal:negative Neurological: negative Behavioral/Psych: negative Endocrine: negative Allergic/Immunologic: negative   PHYSICAL EXAMINATION: General appearance: alert, cooperative, appears stated age and no distress Head: Normocephalic, without obvious abnormality, atraumatic Neck: no adenopathy, no carotid bruit, no JVD, supple, symmetrical, trachea midline and thyroid not enlarged, symmetric, no tenderness/mass/nodules Lymph  nodes: Cervical, supraclavicular, and axillary nodes normal. Resp: clear to auscultation bilaterally Cardio: regular rate and rhythm, S1, S2 normal, no murmur, click, rub or gallop GI: soft, non-tender; bowel sounds normal; no masses,  no organomegaly Extremities: extremities normal, atraumatic, no cyanosis or edema Neurologic: Alert and oriented X 3, normal strength and tone. Normal symmetric reflexes. Normal coordination and gait  ECOG PERFORMANCE STATUS: 1 - Symptomatic but completely ambulatory  Blood pressure 91/61, pulse 88, temperature 97.8 F (36.6 C), temperature source Oral, resp. rate 18, height 5\' 10"  (1.778 m), weight 225 lb (102.059 kg), SpO2 97.00%.  LABORATORY DATA: Lab Results  Component Value Date   WBC 8.2 04/20/2013   HGB 8.8* 04/20/2013   HCT 25.0* 04/20/2013   MCV 93.2 04/20/2013   PLT 151 04/20/2013      Chemistry      Component Value Date/Time   NA 135* 04/11/2013 1038   NA 133* 02/16/2013 0600   K 4.2 04/11/2013 1038   K 4.5 02/16/2013 0600   CL 97 02/16/2013 0600   CO2 25 04/11/2013 1038   CO2 24 02/16/2013 0600   BUN 26.9* 04/11/2013 1038   BUN 30* 02/16/2013 0600   CREATININE 1.7* 04/11/2013 1038   CREATININE 1.49* 02/16/2013 0600   CREATININE 1.46* 12/10/2010 0843      Component Value Date/Time   CALCIUM 9.6 04/11/2013 1038   CALCIUM 9.1 02/16/2013 0600   ALKPHOS 127 04/06/2013 0837   ALKPHOS 86 02/16/2013 0600   AST 11 04/06/2013 0837   AST 13 02/16/2013 0600   ALT 18 04/06/2013 0837   ALT 18 02/16/2013 0600   BILITOT 0.77 04/06/2013 0837   BILITOT 0.3 02/16/2013 0600       RADIOGRAPHIC STUDIES:  Ct Chest W Contrast  04/18/2013   CLINICAL DATA:  Small cell lung cancer diagnosed 02/2013, chemotherapy and XRT in progress, chest pain, cough, shortness of breath  EXAM: CT CHEST WITH CONTRAST  TECHNIQUE: Multidetector CT imaging of the chest was performed during intravenous contrast administration.  CONTRAST:  80mL OMNIPAQUE IOHEXOL 300 MG/ML  SOLN   COMPARISON:  PET-CT dated 02/24/2013.  CT chest dated 02/14/2013.  FINDINGS: Radiation changes in the superior segment left lower lobe. Prior left lower lobe pulmonary nodule/mass has essentially resolved.  Underlying moderate to severe centrilobular  and paraseptal emphysematous changes. Postsurgical changes related to prior right lung wedge resection. No pleural effusion or pneumothorax.  Visualized thyroid is unremarkable.  The heart is normal in size. No pericardial effusion. Coronary atherosclerosis. Atherosclerotic calcifications of the aortic arch.  Improving thoracic lymphadenopathy, including:  --9 mm short axis prevascular node (series 2/ image 22), previously 17 mm  --14 mm AP window node (series 2/image 28), previously 26 mm  --10 mm short axis subcarinal node (series 2/ image 30), previously 12 mm  --12 mm short axis left hilar node (series 2/ image 32), previously 25 mm  Visualized upper abdomen is unremarkable.  Degenerative changes of the visualized thoracolumbar spine. Multiple right rib deformities related to prior thoracotomy.  IMPRESSION: Radiation changes in the superior segment left lower lobe. Postsurgical changes related to prior right lung wedge resection.  Prior left lower lobe pulmonary nodule/mass has essentially resolved.  Improving thoracic lymphadenopathy, as described above.   Electronically Signed   By: Charline Bills M.D.   On: 04/18/2013 09:37     ASSESSMENT/PLAN: This is a very pleasant 57 years old white male with limited stage small cell lung cancer currently undergoing systemic chemotherapy concurrent with radiation, status post 2 cycles. The patient is tolerating his chemotherapy fairly well with no significant adverse effects.  His recent CT scan of the chest showed significant improvement in his disease. I discussed the scan results and showed the images to the patient and his niece. I recommended for him to continue his current systemic chemotherapy with carboplatin  and etoposide. We'll proceed with cycle #3 as scheduled today. He would come back for followup visit in 3 weeks with the next cycle of his chemotherapy.  He was advised to call immediately if she has any concerning symptoms in the interval.  All questions were answered. The patient knows to call the clinic with any problems, questions or concerns. We can certainly see the patient much sooner if necessary.  I spent 15 minutes counseling the patient face to face. The total time spent in the appointment was 25 minutes.   Lajuana Matte., MD 04/20/2013

## 2013-04-20 NOTE — Patient Instructions (Signed)
CURRENT THERAPY: Systemic chemotherapy with carboplatin for an AUC of 5 given on day 1 and etoposide at 120 mg per meter squared given on days 1, 2 and 3 with Neulasta support given on day 4 given every 3 weeks. Status post 2 cycles.  DISEASE STAGE: Limited stage small cell lung cancer Small cell carcinoma of lung   Primary site: Lung (Left)   Staging method: AJCC 7th Edition   Clinical: Stage IIIA (T2a, N2, M0)   Summary: Stage IIIA (T2a, N2, M0) Malignant neoplasm of lower lobe of left lung   Primary site: Lung (Left)   Staging method: AJCC 7th Edition   Summary: Incomplete stage  CHEMOTHERAPY INTENT: Palliative  CURRENT # OF CHEMOTHERAPY CYCLES: 3  CURRENT ANTIEMETICS: Zofran, dexamethasone and Compazine  CURRENT SMOKING STATUS: Former smoker, quit 09/09/2009  ORAL CHEMOTHERAPY AND CONSENT: N./A  CURRENT BISPHOSPHONATES USE: None  PAIN MANAGEMENT: None  NARCOTICS INDUCED CONSTIPATION: None  LIVING WILL AND CODE STATUS: ?

## 2013-04-20 NOTE — Telephone Encounter (Signed)
Per [facility] lab/MD/chemo to start on 12/29 or 12/30. I have started chemo on 12/30. MD and lab on 12/31, asked scheduler to see if we can move them to 12/30.  JMW

## 2013-04-20 NOTE — Progress Notes (Signed)
Followup completed in the chemotherapy room for patient with small cell lung cancer.  Patient reports his pain has improved with pain medication and patch.  He is able to swallow.  His nausea has improved with Phenergan.  Patient has a good appetite and feels hunger throughout the day.  Weight was documented as 225 pounds December 10 from 230.6 pounds November 11.  Nutrition diagnosis: Food and nutrition related knowledge deficit improved.  Intervention: Patient is working on increased oral intake.  He is utilizing strategies for eating with worsening esophagitis.  He verbalizes increased desire to eat.  Teach back method used.  Monitoring, evaluation, goals: Patient is tolerating his diet.  However, has had decreased oral intake and a 5 pound weight loss over the last month.  Next visit: Tuesday, December 30, during chemotherapy.

## 2013-04-21 ENCOUNTER — Ambulatory Visit
Admission: RE | Admit: 2013-04-21 | Discharge: 2013-04-21 | Disposition: A | Discharge status: Home / Self Care | Payer: BC Managed Care – PPO | Source: Ambulatory Visit | Attending: Radiation Oncology | Admitting: Radiation Oncology

## 2013-04-21 ENCOUNTER — Ambulatory Visit (HOSPITAL_BASED_OUTPATIENT_CLINIC_OR_DEPARTMENT_OTHER): Payer: BC Managed Care – PPO

## 2013-04-21 VITALS — BP 112/67 | HR 81 | Temp 97.8°F | Resp 17

## 2013-04-21 VITALS — BP 103/66 | HR 88 | Temp 97.9°F

## 2013-04-21 DIAGNOSIS — C3432 Malignant neoplasm of lower lobe, left bronchus or lung: Secondary | ICD-10-CM

## 2013-04-21 DIAGNOSIS — C3492 Malignant neoplasm of unspecified part of left bronchus or lung: Secondary | ICD-10-CM

## 2013-04-21 DIAGNOSIS — C3430 Malignant neoplasm of lower lobe, unspecified bronchus or lung: Secondary | ICD-10-CM

## 2013-04-21 DIAGNOSIS — Z5111 Encounter for antineoplastic chemotherapy: Secondary | ICD-10-CM

## 2013-04-21 MED ORDER — DEXAMETHASONE SODIUM PHOSPHATE 10 MG/ML IJ SOLN
INTRAMUSCULAR | Status: AC
  Filled 2013-04-21: qty 1

## 2013-04-21 MED ORDER — ETOPOSIDE CHEMO INJECTION 1 GM/50ML
120.0000 mg/m2 | Freq: Once | INTRAVENOUS | Status: AC
  Administered 2013-04-21: 270 mg via INTRAVENOUS
  Filled 2013-04-21: qty 13.5

## 2013-04-21 MED ORDER — ONDANSETRON 8 MG/NS 50 ML IVPB
INTRAVENOUS | Status: AC
  Filled 2013-04-21: qty 8

## 2013-04-21 MED ORDER — SODIUM CHLORIDE 0.9 % IV SOLN
Freq: Once | INTRAVENOUS | Status: AC
  Administered 2013-04-21: 10:00:00 via INTRAVENOUS

## 2013-04-21 MED ORDER — ONDANSETRON 8 MG/50ML IVPB (CHCC)
8.0000 mg | Freq: Once | INTRAVENOUS | Status: AC
  Administered 2013-04-21: 8 mg via INTRAVENOUS

## 2013-04-21 MED ORDER — DEXAMETHASONE SODIUM PHOSPHATE 10 MG/ML IJ SOLN
10.0000 mg | Freq: Once | INTRAMUSCULAR | Status: AC
  Administered 2013-04-21: 10 mg via INTRAVENOUS

## 2013-04-21 NOTE — Progress Notes (Signed)
Friend Dorfman has had 25 fractions to his left lung.  He is in a wheelchair today.  He denies pain but has "a little indigestion."  He has pain with swallowing but says it has been going on for some time.  He says it is painful when he eats.  He says the hycet helps the most.  He has shortness of breath with acitivty that is not any worse than when he started.  Today his oxygen saturation was 95% on room air.  He does have fatigue.  He has a small amount of hyperpigmentation on his left upper back.  He is using radiaplex gel.

## 2013-04-21 NOTE — Patient Instructions (Signed)
Etoposide, VP-16 injection  What is this medicine?  ETOPOSIDE, VP-16 (e toe POE side) is a chemotherapy drug. It is used to treat testicular cancer, lung cancer, and other cancers.  This medicine may be used for other purposes; ask your health care provider or pharmacist if you have questions.  COMMON BRAND NAME(S): Etopophos, Toposar, VePesid  What should I tell my health care provider before I take this medicine?  They need to know if you have any of these conditions:  -infection  -kidney disease  -low blood counts, like low white cell, platelet, or red cell counts  -an unusual or allergic reaction to etoposide, other chemotherapeutic agents, other medicines, foods, dyes, or preservatives  -pregnant or trying to get pregnant  -breast-feeding  How should I use this medicine?  This medicine is for infusion into a vein. It is administered in a hospital or clinic by a specially trained health care professional.  Talk to your pediatrician regarding the use of this medicine in children. Special care may be needed.  Overdosage: If you think you have taken too much of this medicine contact a poison control center or emergency room at once.  NOTE: This medicine is only for you. Do not share this medicine with others.  What if I miss a dose?  It is important not to miss your dose. Call your doctor or health care professional if you are unable to keep an appointment.  What may interact with this medicine?  -cyclosporine  -medicines to increase blood counts like filgrastim, pegfilgrastim, sargramostim  -vaccines  This list may not describe all possible interactions. Give your health care provider a list of all the medicines, herbs, non-prescription drugs, or dietary supplements you use. Also tell them if you smoke, drink alcohol, or use illegal drugs. Some items may interact with your medicine.  What should I watch for while using this medicine?  Visit your doctor for checks on your progress. This drug may make you feel  generally unwell. This is not uncommon, as chemotherapy can affect healthy cells as well as cancer cells. Report any side effects. Continue your course of treatment even though you feel ill unless your doctor tells you to stop.  In some cases, you may be given additional medicines to help with side effects. Follow all directions for their use.  Call your doctor or health care professional for advice if you get a fever, chills or sore throat, or other symptoms of a cold or flu. Do not treat yourself. This drug decreases your body's ability to fight infections. Try to avoid being around people who are sick.  This medicine may increase your risk to bruise or bleed. Call your doctor or health care professional if you notice any unusual bleeding.  Be careful brushing and flossing your teeth or using a toothpick because you may get an infection or bleed more easily. If you have any dental work done, tell your dentist you are receiving this medicine.  Avoid taking products that contain aspirin, acetaminophen, ibuprofen, naproxen, or ketoprofen unless instructed by your doctor. These medicines may hide a fever.  Do not become pregnant while taking this medicine. Women should inform their doctor if they wish to become pregnant or think they might be pregnant. There is a potential for serious side effects to an unborn child. Talk to your health care professional or pharmacist for more information. Do not breast-feed an infant while taking this medicine.  What side effects may I notice from receiving this   medicine?  Side effects that you should report to your doctor or health care professional as soon as possible:  -allergic reactions like skin rash, itching or hives, swelling of the face, lips, or tongue  -low blood counts - this medicine may decrease the number of white blood cells, red blood cells and platelets. You may be at increased risk for infections and bleeding.  -signs of infection - fever or chills, cough, sore  throat, pain or difficulty passing urine  -signs of decreased platelets or bleeding - bruising, pinpoint red spots on the skin, black, tarry stools, blood in the urine  -signs of decreased red blood cells - unusually weak or tired, fainting spells, lightheadedness  -breathing problems  -changes in vision  -mouth or throat sores or ulcers  -pain, redness, swelling or irritation at the injection site  -pain, tingling, numbness in the hands or feet  -redness, blistering, peeling or loosening of the skin, including inside the mouth  -seizures  -vomiting  Side effects that usually do not require medical attention (report to your doctor or health care professional if they continue or are bothersome):  -diarrhea  -hair loss  -loss of appetite  -nausea  -stomach pain  This list may not describe all possible side effects. Call your doctor for medical advice about side effects. You may report side effects to FDA at 1-800-FDA-1088.  Where should I keep my medicine?  This drug is given in a hospital or clinic and will not be stored at home.  NOTE: This sheet is a summary. It may not cover all possible information. If you have questions about this medicine, talk to your doctor, pharmacist, or health care provider.   2014, Elsevier/Gold Standard. (2007-08-30 17:24:12)

## 2013-04-22 ENCOUNTER — Ambulatory Visit (HOSPITAL_BASED_OUTPATIENT_CLINIC_OR_DEPARTMENT_OTHER): Payer: BC Managed Care – PPO

## 2013-04-22 ENCOUNTER — Ambulatory Visit
Admission: RE | Admit: 2013-04-22 | Discharge: 2013-04-22 | Disposition: A | Discharge status: Home / Self Care | Payer: BC Managed Care – PPO | Source: Ambulatory Visit | Attending: Radiation Oncology | Admitting: Radiation Oncology

## 2013-04-22 VITALS — BP 106/60 | HR 91 | Temp 97.8°F | Resp 14

## 2013-04-22 DIAGNOSIS — C781 Secondary malignant neoplasm of mediastinum: Secondary | ICD-10-CM

## 2013-04-22 DIAGNOSIS — Z5111 Encounter for antineoplastic chemotherapy: Secondary | ICD-10-CM

## 2013-04-22 DIAGNOSIS — C3430 Malignant neoplasm of lower lobe, unspecified bronchus or lung: Secondary | ICD-10-CM

## 2013-04-22 DIAGNOSIS — C3492 Malignant neoplasm of unspecified part of left bronchus or lung: Secondary | ICD-10-CM

## 2013-04-22 MED ORDER — DEXAMETHASONE SODIUM PHOSPHATE 10 MG/ML IJ SOLN
10.0000 mg | Freq: Once | INTRAMUSCULAR | Status: AC
  Administered 2013-04-22: 10 mg via INTRAVENOUS

## 2013-04-22 MED ORDER — SODIUM CHLORIDE 0.9 % IV SOLN
Freq: Once | INTRAVENOUS | Status: AC
  Administered 2013-04-22: 10:00:00 via INTRAVENOUS

## 2013-04-22 MED ORDER — SODIUM CHLORIDE 0.9 % IV SOLN
120.0000 mg/m2 | Freq: Once | INTRAVENOUS | Status: AC
  Administered 2013-04-22: 270 mg via INTRAVENOUS
  Filled 2013-04-22: qty 13.5

## 2013-04-22 MED ORDER — ONDANSETRON 8 MG/50ML IVPB (CHCC)
8.0000 mg | Freq: Once | INTRAVENOUS | Status: AC
  Administered 2013-04-22: 8 mg via INTRAVENOUS

## 2013-04-22 MED ORDER — SODIUM CHLORIDE 0.9 % IV SOLN
Freq: Once | INTRAVENOUS | Status: AC
  Administered 2013-04-22: 11:00:00 via INTRAVENOUS

## 2013-04-22 MED ORDER — DEXAMETHASONE SODIUM PHOSPHATE 10 MG/ML IJ SOLN
INTRAMUSCULAR | Status: AC
  Filled 2013-04-22: qty 1

## 2013-04-22 MED ORDER — ONDANSETRON 8 MG/NS 50 ML IVPB
INTRAVENOUS | Status: AC
  Filled 2013-04-22: qty 8

## 2013-04-22 NOTE — Progress Notes (Signed)
Weekly Management Note (Late entry from 04/21/13) Current Dose:50 Gy  Projected Dose:60 Gy   Narrative:  The patient presents for routine under treatment assessment.  CBCT/MVCT images/Port film x-rays were reviewed.  The chart was checked. Doing well. He is hycet to swallow and feels like that is working. He has some fatigue. He has some hyperpigmentation on his back. Overall he is feeling about stable. He is accompanied by his sister. He is not wearing his nasal cannula. He is sitting in a wheelchair.  Physical Findings: Early radiation dermatitis in the upper back. His weight is up from last week.  Vitals:  Filed Vitals:   04/21/13 0901  BP: 103/66  Pulse: 88  Temp: 97.9 F (36.6 C)   Weight:  Wt Readings from Last 3 Encounters:  04/20/13 225 lb (102.059 kg)  04/12/13 221 lb 11.2 oz (100.562 kg)  03/30/13 233 lb 6.4 oz (105.87 kg)   Lab Results  Component Value Date   WBC 8.2 04/20/2013   HGB 8.8* 04/20/2013   HCT 25.0* 04/20/2013   MCV 93.2 04/20/2013   PLT 151 04/20/2013   Lab Results  Component Value Date   CREATININE 1.8* 04/20/2013   BUN 16.0 04/20/2013   NA 139 04/20/2013   K 4.6 04/20/2013   CL 97 02/16/2013   CO2 27 04/20/2013     Impression:  The patient is tolerating radiation.  Plan:  Continue treatment as planned. Continue hycet. I put in a refill for prescription for that and tramadol yesterday. Continue hydration and increased by mouth intake.

## 2013-04-22 NOTE — Progress Notes (Signed)
Dr. Arbutus Ped notified of BP 96/58 with lightheadedness and emesis x4 last night. Per MD, proceed with chemo and IV premeds, give additional 500 cc NS today during infusion room visit.

## 2013-04-22 NOTE — Patient Instructions (Signed)
[facility] Discharge Instructions for Patients Receiving Chemotherapy  Today you received the following chemotherapy agent: Etoposide   To help prevent nausea and vomiting after your treatment, we encourage you to take your nausea medication as prescribed.   If you develop nausea and vomiting that is not controlled by your nausea medication, call the clinic.   BELOW ARE SYMPTOMS THAT SHOULD BE REPORTED IMMEDIATELY:  *FEVER GREATER THAN 100.5 F  *CHILLS WITH OR WITHOUT FEVER  NAUSEA AND VOMITING THAT IS NOT CONTROLLED WITH YOUR NAUSEA MEDICATION  *UNUSUAL SHORTNESS OF BREATH  *UNUSUAL BRUISING OR BLEEDING  TENDERNESS IN MOUTH AND THROAT WITH OR WITHOUT PRESENCE OF ULCERS  *URINARY PROBLEMS  *BOWEL PROBLEMS  UNUSUAL RASH Items with * indicate a potential emergency and should be followed up as soon as possible.  Feel free to call the clinic you have any questions or concerns. The clinic phone number is [phone].    

## 2013-04-23 ENCOUNTER — Ambulatory Visit (HOSPITAL_BASED_OUTPATIENT_CLINIC_OR_DEPARTMENT_OTHER): Payer: BC Managed Care – PPO

## 2013-04-23 VITALS — BP 112/62 | HR 93 | Temp 98.0°F | Resp 17

## 2013-04-23 DIAGNOSIS — C3430 Malignant neoplasm of lower lobe, unspecified bronchus or lung: Secondary | ICD-10-CM

## 2013-04-23 DIAGNOSIS — Z5189 Encounter for other specified aftercare: Secondary | ICD-10-CM

## 2013-04-23 MED ORDER — PEGFILGRASTIM INJECTION 6 MG/0.6ML
6.0000 mg | Freq: Once | SUBCUTANEOUS | Status: AC
  Administered 2013-04-23: 6 mg via SUBCUTANEOUS

## 2013-04-25 ENCOUNTER — Ambulatory Visit
Admission: RE | Admit: 2013-04-25 | Discharge: 2013-04-25 | Disposition: A | Discharge status: Home / Self Care | Payer: BC Managed Care – PPO | Source: Ambulatory Visit | Attending: Radiation Oncology | Admitting: Radiation Oncology

## 2013-04-25 DIAGNOSIS — C3490 Malignant neoplasm of unspecified part of unspecified bronchus or lung: Secondary | ICD-10-CM

## 2013-04-25 MED ORDER — LORAZEPAM 1 MG PO TABS
1.0000 mg | ORAL_TABLET | Freq: Once | ORAL | Status: AC
  Administered 2013-04-25: 1 mg via SUBLINGUAL
  Filled 2013-04-25: qty 1

## 2013-04-25 MED ORDER — LORAZEPAM 1 MG PO TABS: 1.0000 mg | ORAL_TABLET | Freq: Three times a day (TID) | ORAL | Status: DC

## 2013-04-25 MED ORDER — LORAZEPAM 1 MG PO TABS
ORAL_TABLET | ORAL | Status: AC
  Filled 2013-04-25: qty 1

## 2013-04-25 NOTE — Progress Notes (Signed)
Patient had uncontrolled nausea despite taking phenergan suppositories and zofran odt.Given lorazepam 1 mg sublingual and a script.Informed it may make him drowsy.Sister is going to take him home a get him settled in.Will check with patient on tomorrow during routine weekly doctor visit.

## 2013-04-26 ENCOUNTER — Encounter: Payer: Self-pay | Admitting: Radiation Oncology

## 2013-04-26 ENCOUNTER — Ambulatory Visit
Admission: RE | Admit: 2013-04-26 | Discharge: 2013-04-26 | Disposition: A | Discharge status: Home / Self Care | Payer: BC Managed Care – PPO | Source: Ambulatory Visit | Attending: Radiation Oncology | Admitting: Radiation Oncology

## 2013-04-26 ENCOUNTER — Telehealth: Payer: Self-pay | Admitting: Internal Medicine

## 2013-04-26 VITALS — BP 114/69 | HR 99 | Resp 18 | Wt 222.0 lb

## 2013-04-26 DIAGNOSIS — C3432 Malignant neoplasm of lower lobe, left bronchus or lung: Secondary | ICD-10-CM

## 2013-04-26 MED ORDER — FENTANYL 25 MCG/HR TD PT72: 25.0000 ug | MEDICATED_PATCH | TRANSDERMAL | Status: DC

## 2013-04-26 NOTE — Progress Notes (Signed)
Weekly Management Note Current Dose:56 Gy  Projected Dose:60 Gy   Narrative:  The patient presents for routine under treatment assessment.  CBCT/MVCT images/Port film x-rays were reviewed.  The chart was checked. Doing well with ativan for nausea. Sleeping a lot according to sister (pt slept through exam in wheelchair). Eating much better (boost, mashed potatoes and gravy). Needs duragesic refill.   Physical Findings:  Somnolent in a wheelchair.   Vitals:  Filed Vitals:   04/26/13 0909  BP: 114/69  Pulse: 99  Resp: 18   Weight:  Wt Readings from Last 3 Encounters:  04/26/13 222 lb (100.699 kg)  04/20/13 225 lb (102.059 kg)  04/12/13 221 lb 11.2 oz (100.562 kg)   Lab Results  Component Value Date   WBC 8.2 04/20/2013   HGB 8.8* 04/20/2013   HCT 25.0* 04/20/2013   MCV 93.2 04/20/2013   PLT 151 04/20/2013   Lab Results  Component Value Date   CREATININE 1.8* 04/20/2013   BUN 16.0 04/20/2013   NA 139 04/20/2013   K 4.6 04/20/2013   CL 97 02/16/2013   CO2 27 04/20/2013     Impression:  The patient is tolerating radiation.  Plan:  Continue treatment as planned. Refilled fentanyl. F/yu in 1 month. Sooner as needed.

## 2013-04-26 NOTE — Telephone Encounter (Signed)
Gave pt appt for lab,md and chemo ffor december, labs moved from 12/24 to 12/23 per Dr.Wentworth request, nurse notified, also moved lab and MD to 12/30 from the 31st to combine lab,md and chemo, pt aware of all appts

## 2013-04-26 NOTE — Progress Notes (Signed)
Reports the ativan prescribed by Dr. Roselind Messier yesterday has help to relieve his persistent nausea. Patient very drowsy today related to effect of his medication. Portable oxygen therapy 2 liters via nasal cannula noted. Patient reports fatigue. Patient requesting refill of Fentanyl patch because he "put his last one on this morning." Denies skin changes within treatment field. Reports using radiaplex as directed.

## 2013-04-27 ENCOUNTER — Encounter (HOSPITAL_COMMUNITY)
Admission: RE | Admit: 2013-04-27 | Discharge: 2013-04-27 | Disposition: A | Discharge status: Home / Self Care | Payer: BC Managed Care – PPO | Source: Ambulatory Visit | Attending: Internal Medicine | Admitting: Internal Medicine

## 2013-04-27 ENCOUNTER — Other Ambulatory Visit (HOSPITAL_BASED_OUTPATIENT_CLINIC_OR_DEPARTMENT_OTHER): Payer: BC Managed Care – PPO

## 2013-04-27 ENCOUNTER — Other Ambulatory Visit: Payer: Self-pay | Admitting: *Deleted

## 2013-04-27 ENCOUNTER — Ambulatory Visit: Payer: BC Managed Care – PPO

## 2013-04-27 ENCOUNTER — Ambulatory Visit
Admission: RE | Admit: 2013-04-27 | Discharge: 2013-04-27 | Disposition: A | Discharge status: Home / Self Care | Payer: BC Managed Care – PPO | Source: Ambulatory Visit | Attending: Radiation Oncology | Admitting: Radiation Oncology

## 2013-04-27 DIAGNOSIS — C3492 Malignant neoplasm of unspecified part of left bronchus or lung: Secondary | ICD-10-CM

## 2013-04-27 DIAGNOSIS — D649 Anemia, unspecified: Secondary | ICD-10-CM | POA: Insufficient documentation

## 2013-04-27 DIAGNOSIS — C3430 Malignant neoplasm of lower lobe, unspecified bronchus or lung: Secondary | ICD-10-CM

## 2013-04-27 DIAGNOSIS — C3490 Malignant neoplasm of unspecified part of unspecified bronchus or lung: Secondary | ICD-10-CM | POA: Insufficient documentation

## 2013-04-27 LAB — CBC WITH DIFFERENTIAL/PLATELET
BASO%: 2 % (ref 0.0–2.0)
HCT: 21.1 % — ABNORMAL LOW (ref 38.4–49.9)
LYMPH%: 26.5 % (ref 14.0–49.0)
MCHC: 34.6 g/dL (ref 32.0–36.0)
MONO#: 0.1 10*3/uL (ref 0.1–0.9)
MONO%: 14.3 % — ABNORMAL HIGH (ref 0.0–14.0)
NEUT%: 53.1 % (ref 39.0–75.0)
Platelets: 69 10*3/uL — ABNORMAL LOW (ref 140–400)
RBC: 2.36 10*6/uL — ABNORMAL LOW (ref 4.20–5.82)
RDW: 14.7 % — ABNORMAL HIGH (ref 11.0–14.6)
WBC: 0.5 10*3/uL — CL (ref 4.0–10.3)
lymph#: 0.1 10*3/uL — ABNORMAL LOW (ref 0.9–3.3)
nRBC: 0 % (ref 0–0)

## 2013-04-27 LAB — COMPREHENSIVE METABOLIC PANEL (CC13)
ALT: 15 U/L (ref 0–55)
AST: 16 U/L (ref 5–34)
Albumin: 3.3 g/dL — ABNORMAL LOW (ref 3.5–5.0)
Calcium: 9.6 mg/dL (ref 8.4–10.4)
Chloride: 96 mEq/L — ABNORMAL LOW (ref 98–109)
Creatinine: 1.4 mg/dL — ABNORMAL HIGH (ref 0.7–1.3)
Sodium: 134 mEq/L — ABNORMAL LOW (ref 136–145)
Total Bilirubin: 1.4 mg/dL — ABNORMAL HIGH (ref 0.20–1.20)

## 2013-04-27 MED ORDER — MOXIFLOXACIN HCL 400 MG PO TABS: 400.0000 mg | ORAL_TABLET | Freq: Every day | ORAL | Status: DC

## 2013-04-27 NOTE — Progress Notes (Signed)
Hbg 7.3, ANC 0.3, WBC 0.5.  Per Dr Donnald Garre pt needs 2 units blood and avelox 400mg  x 5 days.  Spoke with pt's sister Elita Quick, she is aware of appts. SLJ

## 2013-04-28 ENCOUNTER — Ambulatory Visit: Payer: BC Managed Care – PPO

## 2013-04-28 ENCOUNTER — Ambulatory Visit
Admission: RE | Admit: 2013-04-28 | Discharge: 2013-04-28 | Disposition: A | Discharge status: Home / Self Care | Payer: BC Managed Care – PPO | Source: Ambulatory Visit | Attending: Radiation Oncology | Admitting: Radiation Oncology

## 2013-04-28 ENCOUNTER — Other Ambulatory Visit: Payer: Self-pay

## 2013-04-28 ENCOUNTER — Ambulatory Visit (HOSPITAL_BASED_OUTPATIENT_CLINIC_OR_DEPARTMENT_OTHER): Payer: BC Managed Care – PPO

## 2013-04-28 ENCOUNTER — Encounter (HOSPITAL_COMMUNITY): Payer: Self-pay | Admitting: Emergency Medicine

## 2013-04-28 ENCOUNTER — Encounter: Payer: Self-pay | Admitting: Radiation Oncology

## 2013-04-28 ENCOUNTER — Inpatient Hospital Stay (HOSPITAL_COMMUNITY)
Admission: EM | Admit: 2013-04-28 | Discharge: 2013-05-06 | DRG: 308 | Disposition: A | Discharge status: Home Health | Payer: BC Managed Care – PPO | Attending: Internal Medicine | Admitting: Internal Medicine

## 2013-04-28 ENCOUNTER — Other Ambulatory Visit: Payer: BC Managed Care – PPO

## 2013-04-28 VITALS — BP 121/80 | HR 127 | Temp 97.4°F | Resp 20

## 2013-04-28 DIAGNOSIS — J449 Chronic obstructive pulmonary disease, unspecified: Secondary | ICD-10-CM | POA: Diagnosis present

## 2013-04-28 DIAGNOSIS — Z79899 Other long term (current) drug therapy: Secondary | ICD-10-CM

## 2013-04-28 DIAGNOSIS — K208 Other esophagitis without bleeding: Secondary | ICD-10-CM

## 2013-04-28 DIAGNOSIS — T451X5A Adverse effect of antineoplastic and immunosuppressive drugs, initial encounter: Secondary | ICD-10-CM | POA: Diagnosis present

## 2013-04-28 DIAGNOSIS — D702 Other drug-induced agranulocytosis: Secondary | ICD-10-CM | POA: Diagnosis present

## 2013-04-28 DIAGNOSIS — R5081 Fever presenting with conditions classified elsewhere: Secondary | ICD-10-CM | POA: Diagnosis present

## 2013-04-28 DIAGNOSIS — Z6830 Body mass index (BMI) 30.0-30.9, adult: Secondary | ICD-10-CM

## 2013-04-28 DIAGNOSIS — N189 Chronic kidney disease, unspecified: Secondary | ICD-10-CM | POA: Diagnosis present

## 2013-04-28 DIAGNOSIS — G8929 Other chronic pain: Secondary | ICD-10-CM | POA: Diagnosis present

## 2013-04-28 DIAGNOSIS — C3432 Malignant neoplasm of lower lobe, left bronchus or lung: Secondary | ICD-10-CM

## 2013-04-28 DIAGNOSIS — I509 Heart failure, unspecified: Secondary | ICD-10-CM | POA: Diagnosis present

## 2013-04-28 DIAGNOSIS — O4190X0 Disorder of amniotic fluid and membranes, unspecified, unspecified trimester, not applicable or unspecified: Secondary | ICD-10-CM

## 2013-04-28 DIAGNOSIS — Z87891 Personal history of nicotine dependence: Secondary | ICD-10-CM

## 2013-04-28 DIAGNOSIS — F05 Delirium due to known physiological condition: Secondary | ICD-10-CM

## 2013-04-28 DIAGNOSIS — Z823 Family history of stroke: Secondary | ICD-10-CM

## 2013-04-28 DIAGNOSIS — J441 Chronic obstructive pulmonary disease with (acute) exacerbation: Secondary | ICD-10-CM

## 2013-04-28 DIAGNOSIS — I208 Other forms of angina pectoris: Secondary | ICD-10-CM

## 2013-04-28 DIAGNOSIS — R1310 Dysphagia, unspecified: Secondary | ICD-10-CM

## 2013-04-28 DIAGNOSIS — K222 Esophageal obstruction: Secondary | ICD-10-CM

## 2013-04-28 DIAGNOSIS — I252 Old myocardial infarction: Secondary | ICD-10-CM

## 2013-04-28 DIAGNOSIS — R404 Transient alteration of awareness: Secondary | ICD-10-CM | POA: Diagnosis not present

## 2013-04-28 DIAGNOSIS — D649 Anemia, unspecified: Secondary | ICD-10-CM

## 2013-04-28 DIAGNOSIS — D709 Neutropenia, unspecified: Secondary | ICD-10-CM

## 2013-04-28 DIAGNOSIS — C3430 Malignant neoplasm of lower lobe, unspecified bronchus or lung: Secondary | ICD-10-CM | POA: Diagnosis present

## 2013-04-28 DIAGNOSIS — Z833 Family history of diabetes mellitus: Secondary | ICD-10-CM

## 2013-04-28 DIAGNOSIS — M792 Neuralgia and neuritis, unspecified: Secondary | ICD-10-CM

## 2013-04-28 DIAGNOSIS — I4891 Unspecified atrial fibrillation: Secondary | ICD-10-CM | POA: Diagnosis present

## 2013-04-28 DIAGNOSIS — R933 Abnormal findings on diagnostic imaging of other parts of digestive tract: Secondary | ICD-10-CM

## 2013-04-28 DIAGNOSIS — Y842 Radiological procedure and radiotherapy as the cause of abnormal reaction of the patient, or of later complication, without mention of misadventure at the time of the procedure: Secondary | ICD-10-CM | POA: Diagnosis present

## 2013-04-28 DIAGNOSIS — R6889 Other general symptoms and signs: Secondary | ICD-10-CM | POA: Diagnosis present

## 2013-04-28 DIAGNOSIS — I129 Hypertensive chronic kidney disease with stage 1 through stage 4 chronic kidney disease, or unspecified chronic kidney disease: Secondary | ICD-10-CM | POA: Diagnosis present

## 2013-04-28 DIAGNOSIS — D61810 Antineoplastic chemotherapy induced pancytopenia: Secondary | ICD-10-CM

## 2013-04-28 DIAGNOSIS — R918 Other nonspecific abnormal finding of lung field: Secondary | ICD-10-CM

## 2013-04-28 DIAGNOSIS — J4489 Other specified chronic obstructive pulmonary disease: Secondary | ICD-10-CM | POA: Diagnosis present

## 2013-04-28 DIAGNOSIS — C3490 Malignant neoplasm of unspecified part of unspecified bronchus or lung: Secondary | ICD-10-CM

## 2013-04-28 DIAGNOSIS — I2510 Atherosclerotic heart disease of native coronary artery without angina pectoris: Secondary | ICD-10-CM | POA: Diagnosis present

## 2013-04-28 DIAGNOSIS — Z8673 Personal history of transient ischemic attack (TIA), and cerebral infarction without residual deficits: Secondary | ICD-10-CM

## 2013-04-28 DIAGNOSIS — C801 Malignant (primary) neoplasm, unspecified: Secondary | ICD-10-CM

## 2013-04-28 DIAGNOSIS — Z6832 Body mass index (BMI) 32.0-32.9, adult: Secondary | ICD-10-CM

## 2013-04-28 DIAGNOSIS — Z9221 Personal history of antineoplastic chemotherapy: Secondary | ICD-10-CM

## 2013-04-28 DIAGNOSIS — R859 Unspecified abnormal finding in specimens from digestive organs and abdominal cavity: Secondary | ICD-10-CM

## 2013-04-28 DIAGNOSIS — Z781 Physical restraint status: Secondary | ICD-10-CM | POA: Diagnosis not present

## 2013-04-28 DIAGNOSIS — Z880 Allergy status to penicillin: Secondary | ICD-10-CM

## 2013-04-28 DIAGNOSIS — E119 Type 2 diabetes mellitus without complications: Secondary | ICD-10-CM | POA: Diagnosis present

## 2013-04-28 DIAGNOSIS — Z923 Personal history of irradiation: Secondary | ICD-10-CM

## 2013-04-28 DIAGNOSIS — I10 Essential (primary) hypertension: Secondary | ICD-10-CM

## 2013-04-28 DIAGNOSIS — E8881 Metabolic syndrome: Secondary | ICD-10-CM

## 2013-04-28 DIAGNOSIS — E669 Obesity, unspecified: Secondary | ICD-10-CM | POA: Diagnosis present

## 2013-04-28 DIAGNOSIS — E43 Unspecified severe protein-calorie malnutrition: Secondary | ICD-10-CM | POA: Insufficient documentation

## 2013-04-28 DIAGNOSIS — F29 Unspecified psychosis not due to a substance or known physiological condition: Secondary | ICD-10-CM

## 2013-04-28 DIAGNOSIS — E785 Hyperlipidemia, unspecified: Secondary | ICD-10-CM | POA: Diagnosis present

## 2013-04-28 LAB — CBC
Hemoglobin: 7.5 g/dL — ABNORMAL LOW (ref 13.0–17.0)
MCH: 32.2 pg (ref 26.0–34.0)
MCHC: 36.6 g/dL — ABNORMAL HIGH (ref 30.0–36.0)
Platelets: 36 10*3/uL — ABNORMAL LOW (ref 150–400)
RDW: 14.1 % (ref 11.5–15.5)

## 2013-04-28 LAB — CBC WITH DIFFERENTIAL/PLATELET
Basophils Absolute: 0 10*3/uL (ref 0.0–0.1)
Eosinophils Absolute: 0 10*3/uL (ref 0.0–0.7)
Eosinophils Relative: 4 % (ref 0–5)
HCT: 21.3 % — ABNORMAL LOW (ref 39.0–52.0)
Hemoglobin: 7.6 g/dL — ABNORMAL LOW (ref 13.0–17.0)
Lymphocytes Relative: 57 % — ABNORMAL HIGH (ref 12–46)
MCH: 31 pg (ref 26.0–34.0)
MCHC: 35.7 g/dL (ref 30.0–36.0)
Neutro Abs: 0.1 10*3/uL — ABNORMAL LOW (ref 1.7–7.7)
Neutrophils Relative %: 26 % — ABNORMAL LOW (ref 43–77)
RDW: 14 % (ref 11.5–15.5)
WBC: 0.2 10*3/uL — CL (ref 4.0–10.5)

## 2013-04-28 LAB — PRO B NATRIURETIC PEPTIDE: Pro B Natriuretic peptide (BNP): 678.8 pg/mL — ABNORMAL HIGH (ref 0–125)

## 2013-04-28 LAB — BASIC METABOLIC PANEL
CO2: 25 mEq/L (ref 19–32)
Calcium: 9.1 mg/dL (ref 8.4–10.5)
Creatinine, Ser: 1.35 mg/dL (ref 0.50–1.35)
GFR calc non Af Amer: 57 mL/min — ABNORMAL LOW (ref >90)
Glucose, Bld: 171 mg/dL — ABNORMAL HIGH (ref 70–99)
Sodium: 133 mEq/L — ABNORMAL LOW (ref 135–145)

## 2013-04-28 LAB — CREATININE, SERUM
Creatinine, Ser: 1.32 mg/dL (ref 0.50–1.35)
GFR calc non Af Amer: 58 mL/min — ABNORMAL LOW (ref >90)

## 2013-04-28 LAB — GLUCOSE, CAPILLARY: Glucose-Capillary: 220 mg/dL — ABNORMAL HIGH (ref 70–99)

## 2013-04-28 LAB — TROPONIN I: Troponin I: <0.3 ng/mL (ref <0.30)

## 2013-04-28 LAB — ABO/RH: ABO/RH(D): A NEG

## 2013-04-28 MED ORDER — DILTIAZEM HCL 100 MG IV SOLR
5.0000 mg/h | INTRAVENOUS | Status: DC
  Administered 2013-04-28: 10 mg/h via INTRAVENOUS
  Filled 2013-04-28: qty 100

## 2013-04-28 MED ORDER — ACETAMINOPHEN 325 MG PO TABS
650.0000 mg | ORAL_TABLET | Freq: Four times a day (QID) | ORAL | Status: DC | PRN
  Administered 2013-04-28 – 2013-04-30 (×2): 650 mg via ORAL
  Filled 2013-04-28 (×2): qty 2

## 2013-04-28 MED ORDER — DIPHENHYDRAMINE HCL 25 MG PO CAPS
25.0000 mg | ORAL_CAPSULE | Freq: Once | ORAL | Status: AC
  Administered 2013-04-28: 25 mg via ORAL

## 2013-04-28 MED ORDER — NITROGLYCERIN 0.4 MG SL SUBL: 0.4000 mg | SUBLINGUAL_TABLET | SUBLINGUAL | Status: DC | PRN

## 2013-04-28 MED ORDER — HYDROCODONE-ACETAMINOPHEN 7.5-325 MG/15ML PO SOLN
10.0000 mL | Freq: Four times a day (QID) | ORAL | Status: DC | PRN
  Administered 2013-04-29 – 2013-05-03 (×10): 10 mL via ORAL
  Filled 2013-04-28 (×11): qty 15

## 2013-04-28 MED ORDER — MORPHINE SULFATE 2 MG/ML IJ SOLN
2.0000 mg | INTRAMUSCULAR | Status: DC | PRN
  Administered 2013-04-28 – 2013-04-30 (×6): 2 mg via INTRAVENOUS
  Filled 2013-04-28 (×6): qty 1

## 2013-04-28 MED ORDER — GLIPIZIDE 10 MG PO TABS
10.0000 mg | ORAL_TABLET | Freq: Two times a day (BID) | ORAL | Status: DC
  Filled 2013-04-28 (×3): qty 1

## 2013-04-28 MED ORDER — ACETAMINOPHEN 650 MG RE SUPP: 650.0000 mg | Freq: Four times a day (QID) | RECTAL | Status: DC | PRN

## 2013-04-28 MED ORDER — ATORVASTATIN CALCIUM 40 MG PO TABS
40.0000 mg | ORAL_TABLET | Freq: Every day | ORAL | Status: DC
  Administered 2013-04-28 – 2013-05-05 (×8): 40 mg via ORAL
  Filled 2013-04-28 (×9): qty 1

## 2013-04-28 MED ORDER — DEXTROSE 5 % IV SOLN
5.0000 mg/h | INTRAVENOUS | Status: DC
  Filled 2013-04-28: qty 100

## 2013-04-28 MED ORDER — ASPIRIN EC 81 MG PO TBEC
81.0000 mg | DELAYED_RELEASE_TABLET | Freq: Every day | ORAL | Status: DC
  Administered 2013-04-28: 81 mg via ORAL
  Filled 2013-04-28 (×2): qty 1

## 2013-04-28 MED ORDER — SODIUM CHLORIDE 0.9 % IJ SOLN
3.0000 mL | Freq: Two times a day (BID) | INTRAMUSCULAR | Status: DC
  Administered 2013-04-28 – 2013-04-30 (×3): 3 mL via INTRAVENOUS
  Administered 2013-05-02: 10 mL via INTRAVENOUS
  Administered 2013-05-03 – 2013-05-06 (×6): 3 mL via INTRAVENOUS

## 2013-04-28 MED ORDER — INSULIN ASPART 100 UNIT/ML ~~LOC~~ SOLN
0.0000 [IU] | Freq: Three times a day (TID) | SUBCUTANEOUS | Status: DC
  Administered 2013-04-29 (×2): 5 [IU] via SUBCUTANEOUS
  Administered 2013-04-29: 2 [IU] via SUBCUTANEOUS
  Administered 2013-04-30 – 2013-05-01 (×4): 3 [IU] via SUBCUTANEOUS
  Administered 2013-05-02: 2 [IU] via SUBCUTANEOUS
  Administered 2013-05-02 – 2013-05-03 (×2): 3 [IU] via SUBCUTANEOUS
  Administered 2013-05-03 – 2013-05-04 (×2): 2 [IU] via SUBCUTANEOUS
  Administered 2013-05-04: 3 [IU] via SUBCUTANEOUS
  Administered 2013-05-05: 2 [IU] via SUBCUTANEOUS
  Administered 2013-05-05: 11 [IU] via SUBCUTANEOUS
  Administered 2013-05-06: 3 [IU] via SUBCUTANEOUS

## 2013-04-28 MED ORDER — DILTIAZEM LOAD VIA INFUSION
10.0000 mg | Freq: Once | INTRAVENOUS | Status: AC
  Administered 2013-04-28: 10 mg via INTRAVENOUS
  Filled 2013-04-28: qty 10

## 2013-04-28 MED ORDER — INSULIN ASPART 100 UNIT/ML ~~LOC~~ SOLN
0.0000 [IU] | Freq: Every day | SUBCUTANEOUS | Status: DC
  Administered 2013-04-28: 2 [IU] via SUBCUTANEOUS

## 2013-04-28 MED ORDER — ISOSORBIDE MONONITRATE ER 60 MG PO TB24
60.0000 mg | ORAL_TABLET | Freq: Every day | ORAL | Status: DC
  Administered 2013-04-28 – 2013-05-06 (×9): 60 mg via ORAL
  Filled 2013-04-28 (×9): qty 1

## 2013-04-28 MED ORDER — SODIUM CHLORIDE 0.9 % IV SOLN
250.0000 mL | Freq: Once | INTRAVENOUS | Status: AC
  Administered 2013-04-28: 250 mL via INTRAVENOUS

## 2013-04-28 MED ORDER — ONDANSETRON HCL 4 MG PO TABS
4.0000 mg | ORAL_TABLET | Freq: Four times a day (QID) | ORAL | Status: DC | PRN
  Administered 2013-04-29: 4 mg via ORAL
  Filled 2013-04-28: qty 1

## 2013-04-28 MED ORDER — LORAZEPAM 1 MG PO TABS
1.0000 mg | ORAL_TABLET | Freq: Three times a day (TID) | ORAL | Status: DC
  Administered 2013-04-28 – 2013-04-29 (×4): 1 mg via ORAL
  Filled 2013-04-28 (×5): qty 1

## 2013-04-28 MED ORDER — CLOPIDOGREL BISULFATE 75 MG PO TABS
75.0000 mg | ORAL_TABLET | Freq: Every day | ORAL | Status: DC
  Administered 2013-04-28: 75 mg via ORAL
  Filled 2013-04-28 (×2): qty 1

## 2013-04-28 MED ORDER — DIPHENHYDRAMINE HCL 25 MG PO CAPS
ORAL_CAPSULE | ORAL | Status: AC
  Filled 2013-04-28: qty 1

## 2013-04-28 MED ORDER — ONDANSETRON HCL 4 MG/2ML IJ SOLN
4.0000 mg | Freq: Four times a day (QID) | INTRAMUSCULAR | Status: DC | PRN
  Administered 2013-04-28 – 2013-04-29 (×2): 4 mg via INTRAVENOUS
  Filled 2013-04-28 (×3): qty 2

## 2013-04-28 MED ORDER — ACETAMINOPHEN 325 MG PO TABS
ORAL_TABLET | ORAL | Status: AC
  Filled 2013-04-28: qty 2

## 2013-04-28 MED ORDER — ACETAMINOPHEN 325 MG PO TABS
650.0000 mg | ORAL_TABLET | Freq: Once | ORAL | Status: AC
  Administered 2013-04-28: 650 mg via ORAL

## 2013-04-28 MED ORDER — FENTANYL 25 MCG/HR TD PT72
25.0000 ug | MEDICATED_PATCH | TRANSDERMAL | Status: DC
  Administered 2013-04-28: 25 ug via TRANSDERMAL
  Filled 2013-04-28: qty 1

## 2013-04-28 MED ORDER — ENOXAPARIN SODIUM 40 MG/0.4ML ~~LOC~~ SOLN
40.0000 mg | SUBCUTANEOUS | Status: DC
  Administered 2013-04-28: 40 mg via SUBCUTANEOUS
  Filled 2013-04-28 (×2): qty 0.4

## 2013-04-28 MED ORDER — DILTIAZEM HCL ER COATED BEADS 180 MG PO CP24
180.0000 mg | ORAL_CAPSULE | Freq: Every day | ORAL | Status: DC
  Filled 2013-04-28: qty 1

## 2013-04-28 NOTE — ED Notes (Addendum)
Cardiology at bedside. Excell Seltzer, MD request for continuous 10mg /hr of cardizem; pt converted to sinus tach. EDP made aware.

## 2013-04-28 NOTE — ED Notes (Signed)
Pharmacy paged for request to send cardizem at 1436. Pharmacy called at present time to request cardizem.

## 2013-04-28 NOTE — Progress Notes (Signed)
1255-Dr. Arbutus Ped notified of patient's elevated HR-(130-140)  Order received for an EKG now.  1340-EKG shows A Fib.  Dr. Arbutus Ped notified and order received to transfer patient to the ER.  1345-Pt transported to the ER via bed.  Report given to [facility].  Pt.'s sister [facility] notified of patient's transfer to the ER.  Pt has no complaints at this time.

## 2013-04-28 NOTE — ED Provider Notes (Signed)
CSN: [id number]     Arrival date & time 04/28/13  1355 History   First MD Initiated Contact with Patient 04/28/13 1400     Chief Complaint  Patient presents with  . Tachycardia   (Consider location/radiation/quality/duration/timing/severity/associated sxs/prior Treatment) HPI Comments: To the ER for evaluation of tachycardia. Patient has been radiation therapy and chemotherapy for possible lung cancer. Patient was being administered a blood transfusion at the cancer Center today when he was noted to be in atrial fibrillation with a ventricular response. Patient reports he had onset of shortness of breath and slight palpitations with the symptoms. No clear chest pain associated with symptoms. Patient has been experiencing intermittent palpitations for a long time, but has never been diagnosed with arrhythmia. He does have a history of coronary artery disease, congestive heart failure. His cardiologist is Doctor Shirlee Latch.   Past Medical History  Diagnosis Date  . CAD (coronary artery disease)     a. PCI 3/08 to OM2 and mid CFX; b.  NSTEMI 6/10:  Prior stents patent, 90% dCFX,  mRCA occl (old) with collats; EF 55% on LV-gram => Xience DES 2.5 x 23 mm to distal CFX;  c. abnl MV 12/13 => LHC 04/23/12:  pLAD 50%, oOM1 80%, long stented segment of the CFX extending into the PLOM with the PLOM totally occluded ostially, CFX 70% ISR, mCFX 80% ISR, pRCA chronically occluded. Med Rx planned  . Diabetes mellitus type II   . Hyperlipidemia   . COPD (chronic obstructive pulmonary disease)     Quit tobacco 09/2009; Gold Stage 2 with asthma component - FEV1 1.53 L/54%, 14% fev1 BD response, DLCO 54% - July 2011; MM genotype 01/07/10; unable to afford spiriva and unwilling to try ics due to prior renal failure: state to Dr. Marchelle Gearing, Aug 2011; started on atrovent HFA fall 2011. no desturation on walk test Dec 2011  . CKD (chronic kidney disease)   . Obesity   . History of CVA (cerebrovascular accident) 2007     Without residual defecits  . 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 no further rx  . CHF (congestive heart failure)   . Myocardial infarction     x3  . Hypertension   . Asthma     as child  . Shortness of breath     constant/wears 2 liters  . Pneumonia     02/14/2013 in hospital  . Stroke     7 years- no problems   . Cancer     lung cancer-02/14/2013  . Blood transfusion without reported diagnosis    Past Surgical History  Procedure Laterality Date  . Wedge resection  03/28/2010  . Coronary stent placement  2009  . Cardiac catheterization  2009  . Endobronchial ultrasound Bilateral 02/28/2013    Procedure: ENDOBRONCHIAL ULTRASOUND;  Surgeon: Kalman Shan, MD;  Location: [facility];  Service: Cardiopulmonary;  Laterality: Bilateral;   Family History  Problem Relation Age of Onset  . Stroke Other   . Diabetes Mother   . Cancer Maternal Uncle    History  Substance Use Topics  . Smoking status: Former Smoker -- 1.00 packs/day for 40 years    Types: Cigarettes    Quit date: 09/09/2009  .  Smokeless tobacco: Not on file  . Alcohol Use: No     Comment: rarely    Review of Systems  Respiratory: Positive for shortness of breath.   Cardiovascular: Positive for palpitations.  All other systems reviewed and are negative.    Allergies  Erythromycin and Penicillins  Home Medications   Current Outpatient Rx  Name  Route  Sig  Dispense  Refill  . HYDROcodone-acetaminophen (HYCET) 7.5-325 mg/15 ml solution   Oral   Take 10 mLs by mouth 4 (four) times daily as needed for moderate pain.   473 mL   0   . LORazepam (ATIVAN) 1 MG tablet   Oral   Take 1 tablet (1 mg total)  by mouth every 8 (eight) hours.   30 tablet   0   . prochlorperazine (COMPAZINE) 10 MG tablet   Oral   Take 10 mg by mouth every 6 (six) hours as needed for nausea or vomiting.         . traMADol (ULTRAM) 50 MG tablet   Oral   Take 1 tablet (50 mg total) by mouth every 6 (six) hours as needed.   30 tablet   2   . albuterol (PROAIR HFA) 108 (90 BASE) MCG/ACT inhaler   Inhalation   Inhale 2 puffs into the lungs every 6 (six) hours as needed for wheezing or shortness of breath.         Marland Kitchen albuterol (PROVENTIL) (2.5 MG/3ML) 0.083% nebulizer solution   Nebulization   Take 3 mLs (2.5 mg total) by nebulization 5 (five) times daily as needed for wheezing or shortness of breath.   75 mL   12   . Alum & Mag Hydroxide-Simeth (MAGIC MOUTHWASH W/LIDOCAINE) SOLN   Oral   Take 5 mLs by mouth 4 (four) times daily as needed for mouth pain.   480 mL   3     Mix 80 ml each benadryl. maalox and nystatin. Mix  ...   . amLODipine (NORVASC) 2.5 MG tablet   Oral   Take 2.5 mg by mouth daily.         Marland Kitchen aspirin EC 81 MG tablet   Oral   Take 81 mg by mouth daily.         Marland Kitchen atorvastatin (LIPITOR) 40 MG tablet   Oral   Take 40 mg by mouth at bedtime.         . bisoprolol (ZEBETA) 5 MG tablet   Oral   Take 5 mg by mouth daily.         . clopidogrel (PLAVIX) 75 MG tablet   Oral   Take 1 tablet (75 mg total) by mouth daily. Resume once cleared by the lung doctor after bronchoscopy and lung biopsy         . fentaNYL (DURAGESIC - DOSED MCG/HR) 25 MCG/HR patch   Transdermal   Place 1 patch (25 mcg total) onto the skin every 3 (three) days.   5 patch   0   . Fluticasone Furoate-Vilanterol (BREO ELLIPTA) 100-25 MCG/INH AEPB   Inhalation   Inhale 1 puff into the lungs 2 (two) times daily.   28 each   1   . glipiZIDE (GLUCOTROL) 10 MG tablet   Oral   Take 10 mg by mouth 2 (two) times daily before a meal.          . isosorbide mononitrate (IMDUR) 60 MG 24 hr tablet    Oral   Take 1 tablet (60  mg total) by mouth daily.   30 tablet   8   . losartan (COZAAR) 50 MG tablet   Oral   Take 1 tablet (50 mg total) by mouth daily.   30 tablet   8   . metFORMIN (GLUCOPHAGE-XR) 750 MG 24 hr tablet   Oral   Take 1,500 mg by mouth daily with breakfast.         . moxifloxacin (AVELOX) 400 MG tablet   Oral   Take 1 tablet (400 mg total) by mouth daily at 8 pm. 400mg  x 5 days   5 tablet   0   . nitroGLYCERIN (NITROSTAT) 0.4 MG SL tablet   Sublingual   Place 0.4 mg under the tongue every 5 (five) minutes as needed for chest pain. May repeat up to 3 doses         . ondansetron (ZOFRAN ODT) 4 MG disintegrating tablet   Oral   Take 1 tablet (4 mg total) by mouth every 8 (eight) hours as needed for nausea or vomiting.   20 tablet   0   . promethazine (PHENERGAN) 25 MG suppository   Rectal   Place 1 suppository (25 mg total) rectally every 6 (six) hours as needed for nausea or vomiting.   12 each   0   . sucralfate (CARAFATE) 1 GM/10ML suspension   Oral   Take 10 mLs (1 g total) by mouth 4 (four) times daily -  with meals and at bedtime.   420 mL   0   . Wound Cleansers (RADIAPLEX EX)   Apply externally   Apply topically.          BP 125/81  Temp(Src) 98.8 F (37.1 C) (Oral)  Resp 15  SpO2 100% Physical Exam  Constitutional: He is oriented to person, place, and time. He appears well-developed and well-nourished. No distress.  HENT:  Head: Normocephalic and atraumatic.  Right Ear: Hearing normal.  Left Ear: Hearing normal.  Nose: Nose normal.  Mouth/Throat: Oropharynx is clear and moist and mucous membranes are normal.  Eyes: Conjunctivae and EOM are normal. Pupils are equal, round, and reactive to light.  Neck: Normal range of motion. Neck supple.  Cardiovascular: S1 normal and S2 normal.  An irregularly irregular rhythm present. Tachycardia present.  Exam reveals no gallop and no friction rub.   No murmur heard. Pulmonary/Chest:  Effort normal and breath sounds normal. No respiratory distress. He exhibits no tenderness.  Abdominal: Soft. Normal appearance and bowel sounds are normal. There is no hepatosplenomegaly. There is no tenderness. There is no rebound, no guarding, no tenderness at McBurney's point and negative Murphy's sign. No hernia.  Musculoskeletal: Normal range of motion.  Neurological: He is alert and oriented to person, place, and time. He has normal strength. No cranial nerve deficit or sensory deficit. Coordination normal. GCS eye subscore is 4. GCS verbal subscore is 5. GCS motor subscore is 6.  Skin: Skin is warm, dry and intact. No rash noted. No cyanosis.  Psychiatric: He has a normal mood and affect. His speech is normal and behavior is normal. Thought content normal.    ED Course  Procedures (including critical care time) Labs Review Labs Reviewed  CBC WITH DIFFERENTIAL - Abnormal; Notable for the following:    WBC 0.2 (*)    RBC 2.45 (*)    Hemoglobin 7.6 (*)    HCT 21.3 (*)    Platelets 41 (*)    Neutrophils Relative % 26 (*)  Lymphocytes Relative 57 (*)    Monocytes Relative 13 (*)    Neutro Abs 0.1 (*)    Lymphs Abs 0.1 (*)    Monocytes Absolute 0.0 (*)    All other components within normal limits  BASIC METABOLIC PANEL - Abnormal; Notable for the following:    Sodium 133 (*)    Glucose, Bld 171 (*)    GFR calc non Af Amer 57 (*)    GFR calc Af Amer 66 (*)    All other components within normal limits  PRO B NATRIURETIC PEPTIDE - Abnormal; Notable for the following:    Pro B Natriuretic peptide (BNP) 678.8 (*)    All other components within normal limits  TROPONIN I   Imaging Review No results found.  EKG Interpretation   None       Date: 04/28/2013  Rate: 141  Rhythm: atrial fibrillation  QRS Axis: normal  Intervals: normal  ST/T Wave abnormalities: nonspecific ST/T changes  Conduction Disutrbances:none  Narrative Interpretation:   Old EKG Reviewed: new  Afib    MDM  Diagnosis: Atrial fibrillation with rapid ventricular response  Patient presents to ER for evaluation of atrial fibrillation with rapid ventricular response. This is new-onset atrial fibrillation which apparently occurred while receiving blood transfusion. He had sudden onset of symptoms and was found to be tachycardic. EKG shows atrial fibrillation. Patient has unremarkable cardiac enzyme. He has had response to IV Cardizem by bolus and has been placed on a drip. Blood pressure is normal, no change with Cardizem administration so far.  Cardiology has been contacted. They would prefer medicine admission based on the patient's concomitant cancer history. They will consult.    Gilda Crease, MD 04/28/13 [id number]

## 2013-04-28 NOTE — H&P (Addendum)
[facility] History and Physical  [name] ZOX:[id number] DOB: 1955-05-24 DOA: 04/28/2013  Referring physician:  PCP: Kevin Hakim, MD   Chief Complaint: Afib  HPI: [name] is a 57 y.o. 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 Today at the cancer Center he was found to have heart rates in the 130s to 140s with an EKG showing atrial fibrillation with rapid ventricular for response. It appears she was receiving a blood transfusion while this occurred. His oncologist Dr. Sofie Palmer recommended that patient be transferred to the emergency department for further evaluation and treatment. On presentation a repeat EKG revealed atrial fibrillation with ventricular rate of 139. He was started on IV diltiazem in the emergency department where he spontaneously converted. Patient was seen and evaluated by Dr. Excell Seltzer of cardiology. He reports severe left-sided chest pain characterized as constant, having an intensity of 8/10, which she attributes to chemoradiation therapy. Patient denies history of atrial fibrillation, palpitations, syncope or presyncope. Dr. Excell Seltzer recommended continuing diltiazem IV overnight with plans to transition to oral diltiazem in a.m.                                                                                                      Review of Systems:  Constitutional:  No weight loss, night sweats, Fevers, chills, fatigue.  HEENT:  No headaches, Difficulty swallowing,Tooth/dental problems,Sore throat,  No sneezing, itching, ear ache, nasal congestion, post nasal drip,  Cardio-vascular:   Orthopnea, PND, swelling in lower extremities, anasarca, dizziness, palpitations  GI:  No abdominal pain, nausea, vomiting, diarrhea, change in bowel habits, loss of appetite  Resp:  No shortness of breath with exertion  or at rest. No excess mucus, no productive cough, No non-productive cough, No coughing up of blood.No change in color of mucus.No wheezing.No chest wall deformity  Skin:  no rash or lesions.  GU:  no dysuria, change in color of urine, no urgency or frequency. No flank pain.  Musculoskeletal:  No joint pain or swelling. No decreased range of motion. No back pain.  Psych:  No change in mood or affect. No depression or anxiety. No memory loss.   Past Medical History  Diagnosis Date  . CAD (coronary artery disease)     a. PCI 3/08 to OM2 and mid CFX; b.  NSTEMI 6/10:  Prior stents patent, 90% dCFX,  mRCA occl (old) with collats; EF 55% on LV-gram => Xience DES 2.5 x 23 mm to distal CFX;  c. abnl MV 12/13 => LHC 04/23/12:  pLAD 50%, oOM1 80%, long stented segment of the CFX extending into the PLOM with the PLOM totally occluded ostially, CFX 70% ISR, mCFX 80% ISR, pRCA chronically occluded. Med Rx planned  . Diabetes mellitus type II   . Hyperlipidemia   . COPD (chronic obstructive pulmonary disease)     Quit tobacco 09/2009; Gold Stage 2 with asthma component - FEV1 1.53 L/54%, 14% fev1 BD response, DLCO 54% -  July 2011; MM genotype 01/07/10; unable to afford spiriva and unwilling to try ics due to prior renal failure: state to Dr. Marchelle Gearing, Aug 2011; started on atrovent HFA fall 2011. no desturation on walk test Dec 2011  . CKD (chronic kidney disease)   . Obesity   . History of CVA (cerebrovascular accident) 2007    Without residual defecits  . 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 no further rx  . CHF (congestive heart failure)   . Myocardial infarction     x3  . Hypertension   . Asthma      as child  . Shortness of breath     constant/wears 2 liters  . Pneumonia     02/14/2013 in hospital  . Stroke     7 years- no problems   . Cancer     lung cancer-02/14/2013  . Blood transfusion without reported diagnosis    Past Surgical History  Procedure Laterality Date  . Wedge resection  03/28/2010  . Coronary stent placement  2009  . Cardiac catheterization  2009  . Endobronchial ultrasound Bilateral 02/28/2013    Procedure: ENDOBRONCHIAL ULTRASOUND;  Surgeon: Kalman Shan, MD;  Location: [facility];  Service: Cardiopulmonary;  Laterality: Bilateral;   Social History:  reports that he quit smoking about 3 years ago. His smoking use included Cigarettes. He has a 40 pack-year smoking history. He does not have any smokeless tobacco history on file. He reports that he does not drink alcohol or use illicit drugs.  Allergies  Allergen Reactions  . Erythromycin Other (See Comments)    Reaction while in hospital - pt doesn't know reaction  . Penicillins Other (See Comments)    Childhood reaction - unknown    Family History  Problem Relation Age of Onset  . Stroke Other   . Diabetes Mother   . Cancer Maternal Uncle      Prior to Admission medications   Medication Sig Start Date End Date Taking? Authorizing Provider  albuterol (PROAIR HFA) 108 (90 BASE) MCG/ACT inhaler Inhale 2 puffs into the lungs every 6 (six) hours as needed for wheezing or shortness of breath.   Yes Historical Provider, MD  albuterol (PROVENTIL) (2.5 MG/3ML) 0.083% nebulizer solution Take 3 mLs (2.5 mg total) by nebulization 5 (five) times daily as needed for wheezing or shortness of breath. 02/16/13  Yes Leroy Sea, MD  Alum & Mag Hydroxide-Simeth (MAGIC MOUTHWASH W/LIDOCAINE) SOLN Take 5 mLs by mouth 4 (four) times daily as needed for mouth pain. 04/12/13  Yes Lurline Hare, MD  amLODipine (NORVASC) 2.5 MG tablet Take 2.5 mg by mouth daily.   Yes Historical Provider, MD  aspirin EC 81 MG tablet  Take 81 mg by mouth daily.   Yes Historical Provider, MD  atorvastatin (LIPITOR) 40 MG tablet Take 40 mg by mouth at bedtime.   Yes Historical Provider, MD  bisoprolol (ZEBETA) 5 MG tablet Take 5 mg by mouth daily.   Yes Historical Provider, MD  clopidogrel (PLAVIX) 75 MG tablet Take 1 tablet (75 mg total) by mouth daily. Resume once cleared by the lung doctor after bronchoscopy and lung biopsy 02/21/13  Yes Leroy Sea, MD  fentaNYL (DURAGESIC -  DOSED MCG/HR) 25 MCG/HR patch Place 1 patch (25 mcg total) onto the skin every 3 (three) days. 04/26/13  Yes Lurline Hare, MD  Fluticasone Furoate-Vilanterol (BREO ELLIPTA) 100-25 MCG/INH AEPB Inhale 1 puff into the lungs 2 (two) times daily. 02/16/13  Yes Leroy Sea, MD  glipiZIDE (GLUCOTROL) 10 MG tablet Take 10 mg by mouth 2 (two) times daily before a meal.    Yes Historical Provider, MD  HYDROcodone-acetaminophen (HYCET) 7.5-325 mg/15 ml solution Take 10 mLs by mouth 4 (four) times daily as needed for moderate pain. 04/20/13  Yes Lurline Hare, MD  isosorbide mononitrate (IMDUR) 60 MG 24 hr tablet Take 1 tablet (60 mg total) by mouth daily. 08/10/12  Yes Laurey Morale, MD  LORazepam (ATIVAN) 1 MG tablet Take 1 tablet (1 mg total) by mouth every 8 (eight) hours. 04/25/13  Yes Billie Lade, MD  losartan (COZAAR) 50 MG tablet Take 1 tablet (50 mg total) by mouth daily. 08/10/12  Yes Laurey Morale, MD  metFORMIN (GLUCOPHAGE-XR) 750 MG 24 hr tablet Take 1,500 mg by mouth daily with breakfast.   Yes Historical Provider, MD  moxifloxacin (AVELOX) 400 MG tablet Take 400 mg by mouth daily at 8 pm. 5 days  Started 04/27/13 04/27/13  Yes Si Gaul, MD  nitroGLYCERIN (NITROSTAT) 0.4 MG SL tablet Place 0.4 mg under the tongue every 5 (five) minutes as needed for chest pain. May repeat up to 3 doses   Yes Historical Provider, MD  ondansetron (ZOFRAN ODT) 4 MG disintegrating tablet Take 1 tablet (4 mg total) by mouth every 8 (eight) hours as needed  for nausea or vomiting. 04/12/13  Yes Lurline Hare, MD  prochlorperazine (COMPAZINE) 10 MG tablet Take 10 mg by mouth every 6 (six) hours as needed for nausea or vomiting.   Yes Historical Provider, MD  promethazine (PHENERGAN) 25 MG suppository Place 1 suppository (25 mg total) rectally every 6 (six) hours as needed for nausea or vomiting. 04/12/13  Yes Lurline Hare, MD  sucralfate (CARAFATE) 1 GM/10ML suspension Take 10 mLs (1 g total) by mouth 4 (four) times daily -  with meals and at bedtime. 04/05/13  Yes Lurline Hare, MD  traMADol (ULTRAM) 50 MG tablet Take 1 tablet (50 mg total) by mouth every 6 (six) hours as needed. 04/20/13  Yes Lurline Hare, MD   Physical Exam: Filed Vitals:   04/28/13 1715  BP: 119/76  Pulse: 101  Temp:   Resp: 16    BP 119/76  Pulse 101  Temp(Src) 98.8 F (37.1 C) (Oral)  Resp 16  SpO2 100%  General:  Patient complaining of burning chest pain which she attributes to chemoradiation therapy Eyes: PERRL, normal lids, irises & conjunctiva ENT: grossly normal hearing, lips & tongue Neck: no LAD, masses or thyromegaly Cardiovascular: RRR, no m/r/g. Tachycardic No LE edema. Telemetry: SR, tachycardic  Respiratory: CTA bilaterally, no w/r/r. Normal respiratory effort. Abdomen: soft, ntnd Skin: no rash or induration seen on limited exam Musculoskeletal: grossly normal tone BUE/BLE Psychiatric: grossly normal mood and affect, speech fluent and appropriate Neurologic: grossly non-focal.          Labs on Admission:  Basic Metabolic Panel:  Recent Labs Lab 04/27/13 0859 04/28/13 1430  NA 134* 133*  K 4.7 4.1  CL  --  96  CO2 28 25  GLUCOSE 212* 171*  BUN 23.3 23  CREATININE 1.4* 1.35  CALCIUM 9.6 9.1   Liver Function Tests:  Recent Labs Lab 04/27/13 0859  AST  16  ALT 15  ALKPHOS 95  BILITOT 1.40*  PROT 6.4  ALBUMIN 3.3*   No results found for this basename: LIPASE, AMYLASE,  in the last 168 hours No results found for this  basename: AMMONIA,  in the last 168 hours CBC:  Recent Labs Lab 04/27/13 0859 04/28/13 1430  WBC 0.5* 0.2*  NEUTROABS 0.3* 0.1*  HGB 7.3* 7.6*  HCT 21.1* 21.3*  MCV 89.4 86.9  PLT 69* 41*   Cardiac Enzymes:  Recent Labs Lab 04/28/13 1430  TROPONINI <0.30    BNP (last 3 results)  Recent Labs  02/14/13 1953 04/28/13 1430  PROBNP 290.3* 678.8*   CBG: No results found for this basename: GLUCAP,  in the last 168 hours  Radiological Exams on Admission: No results found.  EKG: Independently reviewed. Initial EKG showing atrial fibrillation with ventricular rates in the 130s.  Assessment/Plan Active Problems:   Atrial fibrillation with RVR   DM   HYPERLIPIDEMIA-MIXED   HYPERTENSION, UNSPECIFIED   CAD, NATIVE VESSEL   Small cell carcinoma of lung   A-fib   1. Atrial fibrillation with rapid ventricular response. Patient undergoing chemoradiation therapy for history of small cell carcinoma. It is possible underlying malignancy involving the lungs precipitating A. fib with RVR, or perhaps related to chemoradiation therapy. Will check a TSH, cycle troponin x3 sets overnight.  Dr. Excell Seltzer of cardiology was consulted, who recommended continuing Cardizem drip overnight with plans to transition to oral Cardizem and morning  If he remains stable and rate controlled. Given the presence of pancytopenia and thrombocytopenia, as well as currently undergoing chemotherapy, likely not a candidate for initiating anticoagulation. Will followup on transthoracic echocardiogram for  2. Pancytopenia. Likely secondary to effects of chemotherapy. Patient receiving Neulasta support that the Cancer Center. 3. Diabetes mellitus. Will discontinue metformin, provide Accu-Cheks q. a.c. each bedtime with a sliding scale coverage. Continue glipizide 10 mg by mouth twice a day. 4. Chronic pain. Patient reporting severe burning chest discomfort which she attributes to chemotherapy. Will continue fentanyl  patch and hydrocodone, when necessary IV morphine for severe breakthrough pain. 5. Hypertension. Patient to be placed on a Cardizem drip overnight, hold off on oral antihypertensive agents for now. 6. History of coronary artery disease. I suspect recurrent chest pain may be related to effects of chemoradiation therapy. EKG did not show ischemic changes. Will cycle troponin x3 sets overnight. Cardiology consulted.     Code Status: Full code Disposition Plan: Will admit patient to the step down unit, start Cardizem drip.  Time spent: 79  Jeralyn Bennett [facility] Pager [phone]

## 2013-04-28 NOTE — ED Notes (Signed)
Bed: UJ81 Expected date:  Expected time:  Means of arrival:  Comments: [facility] PT-afib/tachy

## 2013-04-28 NOTE — Progress Notes (Signed)
Midlevel informed pt with a temp of 101. No new interventions at this time. Pt given tylenol. Will continue to monitor.

## 2013-04-28 NOTE — Consult Note (Signed)
CARDIOLOGY CONSULT NOTE  Patient ID: [name], MRN: [id number], DOB/AGE: March 19, 1956 57 y.o. Admit date: 04/28/2013 Date of Consult: 04/28/2013  Primary Physician: Clydell Hakim, MD Primary Cardiologist: Dr Shirlee Latch Referring Physician: Dr Blinda Leatherwood  Chief Complaint: Atrial fibrillation Reason for Consultation: Atrial fibrillation  HPI: 57 y.o. male w/ PMHx significant for coronary artery disease and small cell lung cancer who presented to [facility] on 04/28/2013 with rapid atrial fibrillation. He was at the cancer Center this morning receiving a blood transfusion. During his transfusion, he developed atrial fibrillation with RVR. He had no perception of tachypalpitations. He has no past history of atrial fibrillation.  The patient has a constant burning sensation across his chest. He relates this to radiation therapy. This is been present for several weeks. There is no association with exertion. He denies shortness of breath at the present time and in fact this is improved from previous. He denies leg swelling, orthopnea, or PND. He has severe pain with associated with swallowing. He denies lightheadedness or syncope.  Emergency room evaluation has included lab work. Pertinent lab studies included troponin which was less than 0.3, and elevated pro BNP of 679, and marked pancytopenia with a white blood cell count of 0.2, hemoglobin 7.6, and platelet count 41.    Past Medical History  Diagnosis Date  . CAD (coronary artery disease)     a. PCI 3/08 to OM2 and mid CFX; b.  NSTEMI 6/10:  Prior stents patent, 90% dCFX,  mRCA occl (old) with collats; EF 55% on LV-gram => Xience DES 2.5 x 23 mm to distal CFX;  c. abnl MV 12/13 => LHC 04/23/12:  pLAD 50%, oOM1 80%, long stented segment of the CFX extending into the PLOM with the PLOM totally occluded ostially, CFX 70% ISR, mCFX 80% ISR, pRCA chronically occluded. Med Rx planned  . Diabetes mellitus type II   . Hyperlipidemia     . COPD (chronic obstructive pulmonary disease)     Quit tobacco 09/2009; Gold Stage 2 with asthma component - FEV1 1.53 L/54%, 14% fev1 BD response, DLCO 54% - July 2011; MM genotype 01/07/10; unable to afford spiriva and unwilling to try ics due to prior renal failure: state to Dr. Marchelle Gearing, Aug 2011; started on atrovent HFA fall 2011. no desturation on walk test Dec 2011  . CKD (chronic kidney disease)   . Obesity   . History of CVA (cerebrovascular accident) 2007    Without residual defecits  . 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 no further rx  . CHF (congestive heart failure)   . Myocardial infarction     x3  . Hypertension   . Asthma     as child  . Shortness of breath     constant/wears 2 liters  . Pneumonia     02/14/2013 in hospital  . Stroke     7 years- no problems   . Cancer     lung cancer-02/14/2013  . Blood transfusion without reported diagnosis       Surgical History:  Past Surgical History  Procedure Laterality Date  . Wedge resection  03/28/2010  . Coronary stent placement  2009  . Cardiac catheterization  2009  . Endobronchial ultrasound Bilateral 02/28/2013    Procedure: ENDOBRONCHIAL ULTRASOUND;  Surgeon: Kalman Shan, MD;  Location: [facility] ENDOSCOPY;  Service: Cardiopulmonary;  Laterality: Bilateral;     Home Meds: Prior to Admission medications   Medication Sig Start Date End Date Taking? Authorizing Provider  HYDROcodone-acetaminophen (HYCET) 7.5-325 mg/15 ml solution Take 10 mLs by mouth 4 (four) times daily as needed for moderate pain. 04/20/13  Yes Lurline Hare, MD  LORazepam (ATIVAN) 1 MG tablet Take 1 tablet (1 mg total) by mouth every 8 (eight)  hours. 04/25/13  Yes Billie Lade, MD  prochlorperazine (COMPAZINE) 10 MG tablet Take 10 mg by mouth every 6 (six) hours as needed for nausea or vomiting.   Yes Historical Provider, MD  traMADol (ULTRAM) 50 MG tablet Take 1 tablet (50 mg total) by mouth every 6 (six) hours as needed. 04/20/13  Yes Lurline Hare, MD  albuterol (PROAIR HFA) 108 (90 BASE) MCG/ACT inhaler Inhale 2 puffs into the lungs every 6 (six) hours as needed for wheezing or shortness of breath.    Historical Provider, MD  albuterol (PROVENTIL) (2.5 MG/3ML) 0.083% nebulizer solution Take 3 mLs (2.5 mg total) by nebulization 5 (five) times daily as needed for wheezing or shortness of breath. 02/16/13   Leroy Sea, MD  Alum & Mag Hydroxide-Simeth (MAGIC MOUTHWASH W/LIDOCAINE) SOLN Take 5 mLs by mouth 4 (four) times daily as needed for mouth pain. 04/12/13   Lurline Hare, MD  amLODipine (NORVASC) 2.5 MG tablet Take 2.5 mg by mouth daily.    Historical Provider, MD  aspirin EC 81 MG tablet Take 81 mg by mouth daily.    Historical Provider, MD  atorvastatin (LIPITOR) 40 MG tablet Take 40 mg by mouth at bedtime.    Historical Provider, MD  bisoprolol (ZEBETA) 5 MG tablet Take 5 mg by mouth daily.    Historical Provider, MD  clopidogrel (PLAVIX) 75 MG tablet Take 1 tablet (75 mg total) by mouth daily. Resume once cleared by the lung doctor after bronchoscopy and lung biopsy 02/21/13   Leroy Sea, MD  fentaNYL (DURAGESIC - DOSED MCG/HR) 25 MCG/HR patch Place 1 patch (25 mcg total) onto the skin every 3 (three) days. 04/26/13   Lurline Hare, MD  Fluticasone Furoate-Vilanterol (BREO ELLIPTA) 100-25 MCG/INH AEPB Inhale 1 puff into the lungs 2 (two) times daily. 02/16/13   Leroy Sea, MD  glipiZIDE (GLUCOTROL) 10 MG tablet Take 10 mg by mouth 2 (two) times daily before a meal.     Historical Provider, MD  isosorbide mononitrate (IMDUR) 60 MG 24 hr tablet Take 1 tablet (60 mg total) by mouth daily. 08/10/12   Laurey Morale, MD  losartan (COZAAR) 50 MG tablet Take 1 tablet (50 mg total) by mouth daily. 08/10/12   Laurey Morale, MD  metFORMIN (GLUCOPHAGE-XR) 750 MG 24 hr tablet Take 1,500 mg by mouth daily with breakfast.    Historical Provider, MD  moxifloxacin (AVELOX) 400 MG tablet Take 1 tablet (400 mg total) by mouth daily at 8 pm. 400mg  x 5 days 04/27/13   Si Gaul, MD  nitroGLYCERIN (NITROSTAT) 0.4 MG SL tablet Place 0.4 mg under the tongue every 5 (five) minutes as needed for chest pain. May repeat up to 3 doses    Historical Provider, MD  ondansetron (ZOFRAN ODT) 4 MG disintegrating tablet Take 1 tablet (4 mg total) by mouth every 8 (eight) hours as needed for nausea or vomiting. 04/12/13   Lurline Hare,  MD  promethazine (PHENERGAN) 25 MG suppository Place 1 suppository (25 mg total) rectally every 6 (six) hours as needed for nausea or vomiting. 04/12/13   Lurline Hare, MD  sucralfate (CARAFATE) 1 GM/10ML suspension Take 10 mLs (1 g total) by mouth 4 (four) times daily -  with meals and at bedtime. 04/05/13   Lurline Hare, MD  Wound Cleansers (RADIAPLEX EX) Apply topically.    Historical Provider, MD    Inpatient Medications:    . diltiazem (CARDIZEM) infusion 10 mg/hr (04/28/13 1549)    Allergies:  Allergies  Allergen Reactions  . Erythromycin Other (See Comments)    Reaction while in hospital - pt doesn't know reaction  . Penicillins Other (See Comments)    Childhood reaction - unknown    History   Social History  . Marital Status: Single    Spouse Name: N/A    Number of Children: 0  . Years of Education: N/A   Occupational History  . 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 Smoking status: Former Smoker -- 1.00 packs/day for 40 years    Types: Cigarettes    Quit date: 09/09/2009  . Smokeless tobacco: Not on file  . Alcohol Use: No     Comment: rarely  . Drug Use: No  . Sexual  Activity: Not on file   Other Topics Concern  . Not on file   Social History Narrative   Single   Here with sister              Family History  Problem Relation Age of Onset  . Stroke Other   . Diabetes Mother   . Cancer Maternal Uncle      Review of Systems: General: negative for chills, fever, night sweats or weight changes.  ENT: negative for rhinorrhea or epistaxis Cardiovascular:  Dermatological: negative for rash Respiratory: negative for cough or wheezing GI: negative for nausea, vomiting, diarrhea, bright red blood per rectum, melena, or hematemesis GU: no hematuria, urgency, or frequency Neurologic: negative for visual changes, syncope, headache, or dizziness Heme: no easy bruising or bleeding Endo: negative for excessive thirst, thyroid disorder, or flushing Musculoskeletal: negative for joint pain or swelling, negative for myalgias All other systems reviewed and are otherwise negative except as noted above.  Physical Exam: Blood pressure 121/70, pulse 105, temperature 98.8 F (37.1 C), temperature source Oral, resp. rate 15, SpO2 99.00%. General: Chronically ill-appearing male, alert and oriented to all spheres, moderately uncomfortable secondary to pain HEENT: Normocephalic, atraumatic, sclera non-icteric, no xanthomas, nares are without discharge.  Neck: Supple. Carotids 2+ without bruits. JVP normal Lungs: Clear bilaterally to auscultation without wheezes, rales, or rhonchi. Breathing is unlabored. Heart: Tachycardic and irregular with normal S1 and S2. No murmurs, rubs, or gallops appreciated. Abdomen: Soft, non-tender, non-distended with normoactive bowel sounds. No hepatomegaly. No rebound/guarding. No obvious abdominal masses. Back: No CVA tenderness Msk:  Strength and tone appear normal for age. Extremities: No clubbing, cyanosis, or edema.  Distal pedal pulses are 2+ and equal bilaterally. Neuro: CNII-XII intact, moves all extremities  spontaneously. Psych:  Responds to questions appropriately with a normal affect.    Labs:  Recent Labs  04/28/13 1430  TROPONINI <0.30   Lab Results  Component Value Date   WBC 0.2* 04/28/2013   HGB 7.6* 04/28/2013   HCT 21.3* 04/28/2013   MCV 86.9 04/28/2013   PLT 41* 04/28/2013  Recent Labs Lab 04/27/13 0859 04/28/13 1430  NA 134* 133*  K 4.7 4.1  CL  --  96  CO2 28 25  BUN 23.3 23  CREATININE 1.4* 1.35  CALCIUM 9.6 9.1  PROT 6.4  --   BILITOT 1.40*  --   ALKPHOS 95  --   ALT 15  --   AST 16  --   GLUCOSE 212* 171*   Lab Results  Component Value Date   CHOL 123 11/11/2012   HDL 26.40* 11/11/2012   LDLCALC 72 11/11/2012   TRIG 125.0 11/11/2012   No results found for this basename: DDIMER    Radiology/Studies:  Ct Chest W Contrast  04/18/2013   CLINICAL DATA:  Small cell lung cancer diagnosed 02/2013, chemotherapy and XRT in progress, chest pain, cough, shortness of breath  EXAM: CT CHEST WITH CONTRAST  TECHNIQUE: Multidetector CT imaging of the chest was performed during intravenous contrast administration.  CONTRAST:  80mL OMNIPAQUE IOHEXOL 300 MG/ML  SOLN  COMPARISON:  PET-CT dated 02/24/2013.  CT chest dated 02/14/2013.  FINDINGS: Radiation changes in the superior segment left lower lobe. Prior left lower lobe pulmonary nodule/mass has essentially resolved.  Underlying moderate to severe centrilobular and paraseptal emphysematous changes. Postsurgical changes related to prior right lung wedge resection. No pleural effusion or pneumothorax.  Visualized thyroid is unremarkable.  The heart is normal in size. No pericardial effusion. Coronary atherosclerosis. Atherosclerotic calcifications of the aortic arch.  Improving thoracic lymphadenopathy, including:  --9 mm short axis prevascular node (series 2/ image 22), previously 17 mm  --14 mm AP window node (series 2/image 28), previously 26 mm  --10 mm short axis subcarinal node (series 2/ image 30), previously 12 mm  --12 mm  short axis left hilar node (series 2/ image 32), previously 25 mm  Visualized upper abdomen is unremarkable.  Degenerative changes of the visualized thoracolumbar spine. Multiple right rib deformities related to prior thoracotomy.  IMPRESSION: Radiation changes in the superior segment left lower lobe. Postsurgical changes related to prior right lung wedge resection.  Prior left lower lobe pulmonary nodule/mass has essentially resolved.  Improving thoracic lymphadenopathy, as described above.   Electronically Signed   By: Charline Bills M.D.   On: 04/18/2013 09:37   2-D echocardiogram 2012: Study Conclusions  - Left ventricle: The cavity size was normal. There was mild concentric hypertrophy. Systolic function was normal. The estimated ejection fraction was in the range of 55% to 65%. Wall motion was normal; there were no regional wall motion abnormalities. Doppler parameters are consistent with abnormal left ventricular relaxation (grade 1 diastolic dysfunction). - Right ventricle: Systolic function was normal. - Pulmonary arteries: Systolic pressure was within the normal range. Impressions:  - Normal study. Challenging image quality.  EKG: Atrial fibrillation RVR, otherwise within normal limits, heart rate 139 beats per minute  ASSESSMENT AND PLAN:  1. Atrial fibrillation with rapid ventricular response. During my evaluation, the patient converted to sinus tachycardia. He will be continued on a diltiazem drip overnight. Would change him to oral diltiazem tomorrow morning. Probably a good idea to stay on oral diltiazem long term if he tolerates. His CHADS-Vasc score is  5 (CAD, HTN, hx Stroke 2, and diabetes). In in the absence of contraindications, would recommend anticoagulation. However, considering this gentleman continues with chemotherapy and has severe pancytopenia, I would not recommend aspirin or oral anticoagulation at this time.  2. Coronary artery disease, native vessel. Continue  observation. No evidence of anginal symptoms. His constant chest  pain is likely related to the effects of radiation therapy.  3. Hypertension. Will stop amlodipine and start diltiazem.  Disposition: Plan oral Cardizem transition in the morning. Will check a 2-D echocardiogram to rule out cardiomyopathy, pericardial disease, or other potential causes of atrial fibrillation. If he maintains sinus rhythm overnight he probably could be discharged home tomorrow as long as his other medical problems are stable. Pain control seems to be a major issue as the patient is quite uncomfortable. Have requested that the hospitalist service admit this patient and we will follow him closely in consultation.  Enzo Bi  04/28/2013, 4:34 PM

## 2013-04-28 NOTE — ED Notes (Signed)
Bed: WU98 Expected date:  Expected time:  Means of arrival:  Comments: Nurse off floor

## 2013-04-28 NOTE — ED Notes (Signed)
Per cancer center pt was receiving 2 units of blood post last radiation treatment. Pt completed 1 unit of blood and was getting last vital signs from that unit. Pt HR in 130-140s; all other vitals WNL. Pt denies complaints related to change in HR. EKG obtained at cancer center will reading of A fib pt denies ever being told having A fib. Pt reports pain 4/10 from pain with swallowing r/t to radiation.

## 2013-04-28 NOTE — Progress Notes (Signed)
  Radiation Oncology         [phone] ________________________________  Name: [name] MRN: [id number]  Date: 04/28/2013  DOB: Oct 05, 1955  End of Treatment Note  Diagnosis:   Limited Stage Small Cell Lung Cancer     Indication for treatment:  Curative       Radiation treatment dates:   03/16/2013-04/28/2013  Site/dose:   Left lung/ 60 Gy in 30 fractions at 2 Gy per fraction  Beams/energy:   4 field technique with 6 and 10 MV photons. Daily CBCT was used for image guiadance  Narrative: The patient tolerated radiation treatment relatively well.   He had significant esophagitis which was was controlled with duragesic and lortab.   Plan: The patient has completed radiation treatment. The patient will return to radiation oncology clinic for routine followup in one month. I advised them to call or return sooner if they have any questions or concerns related to their recovery or treatment.  ------------------------------------------------  Lurline Hare, MD

## 2013-04-28 NOTE — Patient Instructions (Signed)
Blood Transfusion  A blood transfusion replaces your blood or some of its parts. Blood is replaced when you have lost blood because of surgery, an accident, or for severe blood conditions like anemia. You can donate blood to be used on yourself if you have a planned surgery. If you lose blood during that surgery, your own blood can be given back to you. Any blood given to you is checked to make sure it matches your blood type. Your temperature, blood pressure, and heart rate (vital signs) will be checked often.  GET HELP RIGHT AWAY IF:   You feel sick to your stomach (nauseous) or throw up (vomit).  You have watery poop (diarrhea).  You have shortness of breath or trouble breathing.  You have blood in your pee (urine) or have dark colored pee.  You have chest pain or tightness.  Your eyes or skin turn yellow (jaundice).  You have a temperature by mouth above 102 F (38.9 C), not controlled by medicine.  You start to shake and have chills.  You develop a a red rash (hives) or feel itchy.  You develop lightheadedness or feel confused.  You develop back, joint, or muscle pain.  You do not feel hungry (lost appetite).  You feel tired, restless, or nervous.  You develop belly (abdominal) cramps. Document Released: 07/25/2008 Document Revised: 07/21/2011 Document Reviewed: 07/25/2008 Exit[employer] Patient Information 2014 [employer].  

## 2013-04-28 NOTE — ED Notes (Signed)
Bed: ZO10 Expected date:  Expected time:  Means of arrival:  Comments: NURSE OFF FLOOR

## 2013-04-29 ENCOUNTER — Inpatient Hospital Stay (HOSPITAL_COMMUNITY): Payer: BC Managed Care – PPO

## 2013-04-29 DIAGNOSIS — D709 Neutropenia, unspecified: Secondary | ICD-10-CM

## 2013-04-29 DIAGNOSIS — D61810 Antineoplastic chemotherapy induced pancytopenia: Secondary | ICD-10-CM

## 2013-04-29 DIAGNOSIS — I517 Cardiomegaly: Secondary | ICD-10-CM

## 2013-04-29 DIAGNOSIS — R5081 Fever presenting with conditions classified elsewhere: Secondary | ICD-10-CM

## 2013-04-29 LAB — BASIC METABOLIC PANEL
BUN: 23 mg/dL (ref 6–23)
Calcium: 8.7 mg/dL (ref 8.4–10.5)
Creatinine, Ser: 1.38 mg/dL — ABNORMAL HIGH (ref 0.50–1.35)
GFR calc non Af Amer: 55 mL/min — ABNORMAL LOW (ref >90)
Glucose, Bld: 216 mg/dL — ABNORMAL HIGH (ref 70–99)
Potassium: 4.1 mEq/L (ref 3.5–5.1)
Sodium: 130 mEq/L — ABNORMAL LOW (ref 135–145)

## 2013-04-29 LAB — CBC
HCT: 19 % — ABNORMAL LOW (ref 39.0–52.0)
HCT: 19.9 % — ABNORMAL LOW (ref 39.0–52.0)
Hemoglobin: 6.7 g/dL — CL (ref 13.0–17.0)
Hemoglobin: 7.3 g/dL — ABNORMAL LOW (ref 13.0–17.0)
MCH: 31.2 pg (ref 26.0–34.0)
MCH: 31.6 pg (ref 26.0–34.0)
MCHC: 35.3 g/dL (ref 30.0–36.0)
MCV: 86.1 fL (ref 78.0–100.0)
RBC: 2.31 MIL/uL — ABNORMAL LOW (ref 4.22–5.81)
RDW: 14.1 % (ref 11.5–15.5)
RDW: 14.4 % (ref 11.5–15.5)
WBC: 0.2 10*3/uL — CL (ref 4.0–10.5)
WBC: 0.1 10*3/uL — CL (ref 4.0–10.5)

## 2013-04-29 LAB — HEMOGLOBIN A1C
Hgb A1c MFr Bld: 8.4 % — ABNORMAL HIGH (ref <5.7)
Mean Plasma Glucose: 194 mg/dL — ABNORMAL HIGH (ref <117)

## 2013-04-29 LAB — INFLUENZA PANEL BY PCR (TYPE A & B)
H1N1 flu by pcr: NOT DETECTED
Influenza A By PCR: NEGATIVE
Influenza B By PCR: NEGATIVE

## 2013-04-29 LAB — GLUCOSE, CAPILLARY
Glucose-Capillary: 234 mg/dL — ABNORMAL HIGH (ref 70–99)
Glucose-Capillary: 202 mg/dL — ABNORMAL HIGH (ref 70–99)

## 2013-04-29 LAB — PREPARE RBC (CROSSMATCH)

## 2013-04-29 MED ORDER — FILGRASTIM 480 MCG/1.6ML IJ SOLN
480.0000 ug | Freq: Every day | INTRAMUSCULAR | Status: AC
  Administered 2013-04-29 – 2013-05-01 (×3): 480 ug via SUBCUTANEOUS
  Filled 2013-04-29 (×3): qty 1.6

## 2013-04-29 MED ORDER — FAMOTIDINE 20 MG PO TABS
20.0000 mg | ORAL_TABLET | Freq: Two times a day (BID) | ORAL | Status: DC
  Administered 2013-04-29 – 2013-05-02 (×8): 20 mg via ORAL
  Filled 2013-04-29 (×10): qty 1

## 2013-04-29 MED ORDER — METOPROLOL TARTRATE 25 MG PO TABS
25.0000 mg | ORAL_TABLET | Freq: Two times a day (BID) | ORAL | Status: DC
  Administered 2013-04-29 – 2013-05-06 (×15): 25 mg via ORAL
  Filled 2013-04-29 (×17): qty 1

## 2013-04-29 MED ORDER — DILTIAZEM HCL ER COATED BEADS 240 MG PO CP24
240.0000 mg | ORAL_CAPSULE | Freq: Every day | ORAL | Status: DC
  Administered 2013-04-29 – 2013-05-06 (×8): 240 mg via ORAL
  Filled 2013-04-29 (×8): qty 1

## 2013-04-29 MED ORDER — INSULIN DETEMIR 100 UNIT/ML ~~LOC~~ SOLN
10.0000 [IU] | Freq: Every day | SUBCUTANEOUS | Status: DC
  Administered 2013-04-29 – 2013-05-06 (×8): 10 [IU] via SUBCUTANEOUS
  Filled 2013-04-29 (×9): qty 0.1

## 2013-04-29 MED ORDER — VANCOMYCIN HCL 10 G IV SOLR
1500.0000 mg | INTRAVENOUS | Status: DC
Start: 2013-04-29 — End: 2013-04-29
  Filled 2013-04-29: qty 1500

## 2013-04-29 MED ORDER — DEXTROSE 5 % IV SOLN
2.0000 g | Freq: Three times a day (TID) | INTRAVENOUS | Status: DC
  Administered 2013-04-29 – 2013-05-02 (×9): 2 g via INTRAVENOUS
  Filled 2013-04-29 (×11): qty 2

## 2013-04-29 MED ORDER — SUCRALFATE 1 GM/10ML PO SUSP
1.0000 g | Freq: Three times a day (TID) | ORAL | Status: DC
  Administered 2013-04-29 – 2013-05-06 (×26): 1 g via ORAL
  Filled 2013-04-29 (×34): qty 10

## 2013-04-29 NOTE — Progress Notes (Signed)
Pt with wbc of 0.2, hgb of 6.7 and platelet of 27. Midlevel paged awaiting call back

## 2013-04-29 NOTE — Progress Notes (Signed)
Inpatient Diabetes Program Recommendations  AACE/ADA: New Consensus Statement on Inpatient Glycemic Control (2013)  Target Ranges:  Prepandial:   less than 140 mg/dL      Peak postprandial:   less than 180 mg/dL (1-2 hours)      Critically ill patients:  140 - 180 mg/dL   Reason for Visit: Hyperglycemia Results for KAVION, MANCINAS (MRN [id number]) as of 04/29/2013 12:18  Ref. Range 04/28/2013 21:17 04/29/2013 07:46  Glucose-Capillary Latest Range: 70-99 mg/dL 147 (H) 829 (H)  Results for TOMER, CHALMERS (MRN [id number]) as of 04/29/2013 12:18  Ref. Range 04/29/2013 04:15  Sodium Latest Range: 136-145 mEq/L 130 (L)  Potassium Latest Range: 3.5-5.1 mEq/L 4.1  Chloride Latest Range: 96-112 mEq/L 95 (L)  CO2 Latest Range: 19-32 mEq/L 26  BUN Latest Range: 7.0-26.0 mg/dL 23  Creatinine Latest Range: 0.50-1.35 mg/dL 7.84 (H)  Calcium Latest Range: 8.4-10.5 mg/dL 8.7  GFR calc non Af Amer Latest Range: >90 mL/min 55 (L)  GFR calc Af Amer Latest Range: >90 mL/min 64 (L)  Glucose Latest Range: 70-99 mg/dL 696 (H)  Results for GID, SCHOFFSTALL (MRN [id number]) as of 04/29/2013 12:18  Ref. Range 04/28/2013 19:21  Hemoglobin A1C Latest Range: <5.7 % 8.4 (H)   Noted pt is on metformin 1500mg  QD and glipizide 10mg  bid.  Inpatient Diabetes Program Recommendations Insulin - Basal: May benefit from addition of basal insulin - Lantus 10 units QHS  Note: Will follow while inpatient. Thank you. Ailene Ards, RD, LDN, CDE Inpatient Diabetes Coordinator [phone]

## 2013-04-29 NOTE — Progress Notes (Signed)
CARE MANAGEMENT NOTE 04/29/2013  Patient:  [name], [name]   Account Number:  [id number]  Date Initiated:  04/29/2013  Documentation initiated by:  DAVIS,RHONDA  Subjective/Objective Assessment:   pt with hx of caricmona and recent rt to chest wall, presented to md with pain and weakness, pt found to be in a.fib with rvr, hgb falling from 8,8 to 6.7     Action/Plan:   lives alone may need short term placement for strengthening.   Anticipated DC Date:  05/02/2013   Anticipated DC Plan:  HOME/SELF CARE  In-house referral  NA      DC Planning Services  NA      [facility] Choice  NA   Choice offered to / List presented to:  NA   DME arranged  NA      DME agency  NA     HH arranged  NA      HH agency  NA   Status of service:  In process, will continue to follow Medicare Important Message given?  NA - LOS <3 / Initial given by admissions (If response is "NO", the following Medicare IM given date fields will be blank) Date Medicare IM given:   Date Additional Medicare IM given:    Discharge Disposition:    Per UR Regulation:  Reviewed for med. necessity/level of care/duration of stay  If discussed at Long Length of Stay Meetings, dates discussed:    Comments:  [id number]/Rhonda Stark Jock, BSN, [place] [phone] Chart Reviewed for discharge and hospital needs. Discharge needs at time of review:  None present will follow for needs. Review of patient progress due on [id number].

## 2013-04-29 NOTE — Progress Notes (Addendum)
     SUBJECTIVE: No new complaints. He has constant chest discomfort since starting radiation therapy.   BP 132/67  Pulse 106  Temp(Src) 98.2 F (36.8 C) (Oral)  Resp 18  Ht 5\' 10"  (1.778 m)  Wt 213 lb 10 oz (96.9 kg)  BMI 30.65 kg/m2  SpO2 98%  Intake/Output Summary (Last 24 hours) at 04/29/13 0654 Last data filed at 04/29/13 1610  Gross per 24 hour  Intake 864.33 ml  Output    500 ml  Net 364.33 ml    PHYSICAL EXAM General: Well developed, well nourished, in no acute distress. Alert and oriented x 3.  Psych:  Good affect, responds appropriately Neck: No JVD. No masses noted.  Lungs: Clear bilaterally with no wheezes or rhonci noted.  Heart: Regular, tachy with no murmurs noted. Abdomen: Bowel sounds are present. Soft, non-tender.  Extremities: No lower extremity edema.   LABS: Basic Metabolic Panel:  Recent Labs  96/04/54 1430 04/28/13 1921 04/29/13 0415  NA 133*  --  130*  K 4.1  --  4.1  CL 96  --  95*  CO2 25  --  26  GLUCOSE 171*  --  216*  BUN 23  --  23  CREATININE 1.35 1.32 1.38*  CALCIUM 9.1  --  8.7   CBC:  Recent Labs  04/27/13 0859  04/28/13 1430 04/28/13 1921 04/29/13 0415  WBC 0.5*  < > 0.2* 0.2* 0.2*  NEUTROABS 0.3*  --  0.1*  --   --   HGB 7.3*  < > 7.6* 7.5* 6.7*  HCT 21.1*  < > 21.3* 20.5* 19.0*  MCV 89.4  < > 86.9 88.0 88.4  PLT 69*  < > 41* 36* 27*  < > = values in this interval not displayed. Cardiac Enzymes:  Recent Labs  04/28/13 1430 04/28/13 1924 04/29/13 0050  TROPONINI <0.30 <0.30 <0.30   Current Meds: . aspirin EC  81 mg Oral Daily  . atorvastatin  40 mg Oral QHS  . clopidogrel  75 mg Oral Daily  . enoxaparin (LOVENOX) injection  40 mg Subcutaneous Q24H  . fentaNYL  25 mcg Transdermal Q72H  . glipiZIDE  10 mg Oral BID AC  . insulin aspart  0-15 Units Subcutaneous TID WC  . insulin aspart  0-5 Units Subcutaneous QHS  . isosorbide mononitrate  60 mg Oral Daily  . LORazepam  1 mg Oral Q8H  . sodium chloride   3 mL Intravenous Q12H     ASSESSMENT AND PLAN:   1. Atrial fibrillation with rapid ventricular response:  Pt has converted to sinus. Will change to po Cardizem today. Probably a good idea to stay on oral diltiazem long term if he tolerates. His CHADS-Vasc score is 5 (CAD, HTN, hx Stroke 2, and diabetes). In the absence of contraindications, would recommend anticoagulation, however, with ongoing chemotherapy and severe pancytopenia, would not recommend aspirin or oral anticoagulation at this time. Echo pending today to assess LVEF, exclude pericardial disease.   2. Coronary artery disease, native vessel: Continue observation. No evidence of anginal symptoms. His constant chest pain is likely related to the effects of radiation therapy. Troponin levels are normal.   3. Hypertension: Amlodipine has been stopped. Will start po Cardizem.    4. Anemia: Transfusion of pRBCs in progress.    He can follow up with Dr. Shirlee Latch in our [address] office within the next 1-2 weeks.    [name]  12/19/20146:54 AM

## 2013-04-29 NOTE — Progress Notes (Signed)
[facility] PROGRESS NOTE  [name]:[id number] DOB: 11/12/1955 DOA: 04/28/2013 PCP: Clydell Hakim, MD  Assessment/Plan  A-fib with RVR, HR now in the low 100s after dilt infusion -  Appreciate cardiology assistance -  Troponins negative -  Heart rate trended down to the low 100s on diltiazem -  Transitioning to oral medications this morning -  Followup echocardiogram -  Continue telemetry  Febrile neutropenia with fever to 101 Fahrenheit, asymptomatic, started prior to blood transfusion -  Blood cultures x2 -  Urinalysis pending -  Chest x-ray unremarkable -  Start empiric cefepime -  Will touch base with hematology oncology  Pancytopenia due to chemotherapy, receive Neulasta at cancer Center -  Trend CBC -  Discontinue aspirin, Plavix, and Lovenox due to low platelets and high risk of bleeding -  Transfuse 1 unit PRBCs 12/19  Type 2 diabetes mellitus - CBG stable -  Hold glipizide -  Continue sliding scale insulin  Radiation esophagitis -  Start famotidine and Carafate -  Patient states that Magic mouthwash has not helped in the past -  Continue fentanyl patch and hydrocodone when necessary  Hypertension, holding high blood pressure medication secondary to titration of Cardizem  Coronary artery disease with chest pain which is chronic. This pain is likely not due to ACS -  EKG without ischemic changes -  Troponins negative   Diet:  Diabetic  Access:  PIV  IVF:  Yes  Proph:  SCD  Code Status: Full  Family Communication: Spoke to patient alone  Disposition Plan: Pending blood cultures negative x24-48 hours, heart rate stable, blood counts improving   Consultants:  Cardiology, Doctor Excell Seltzer  Oncology  Procedures:  Chest x-ray  Blood transfusion  Antibiotics:  Cefepime 12/19   HPI/Subjective:  Patient denies acute complaints. He would really like to go home today. Denies cough, nausea, vomiting, diarrhea, dysuria. He has  persistent ongoing chest pain which he takes acid reflux medication and Carafate for at home.   Objective: Filed Vitals:   04/29/13 0400 04/29/13 0635 04/29/13 0700 04/29/13 0735  BP: 92/60 132/67 120/68 110/75  Pulse:  106 103 101  Temp: 98 F (36.7 C) 98.2 F (36.8 C)  98.1 F (36.7 C)  TempSrc: Oral Oral  Oral  Resp: 21 18 25 22   Height:      Weight:      SpO2: 98% 98% 97% 97%    Intake/Output Summary (Last 24 hours) at 04/29/13 0813 Last data filed at 04/29/13 0735  Gross per 24 hour  Intake   1231 ml  Output    500 ml  Net    731 ml   Filed Weights   04/28/13 2015  Weight: 96.9 kg (213 lb 10 oz)    Exam:   General:   overweight Caucasian male, No acute distress, Alopecia   HEENT:  NCAT, MMM, no thrush  Cardiovascular:   tachycardic, RR, nl S1, S2 no mrg, 2+ pulses, warm extremities  Respiratory:  CTAB, no increased WOB  Abdomen:   NABS, soft, NT/ND  MSK:   Normal tone and bulk, no LEE  Neuro:  Grossly intact  Data Reviewed: Basic Metabolic Panel:  Recent Labs Lab 04/27/13 0859 04/28/13 1430 04/28/13 1921 04/29/13 0415  NA 134* 133*  --  130*  K 4.7 4.1  --  4.1  CL  --  96  --  95*  CO2 28 25  --  26  GLUCOSE 212* 171*  --  216*  BUN 23.3 23  --  23  CREATININE 1.4* 1.35 1.32 1.38*  CALCIUM 9.6 9.1  --  8.7   Liver Function Tests:  Recent Labs Lab 04/27/13 0859  AST 16  ALT 15  ALKPHOS 95  BILITOT 1.40*  PROT 6.4  ALBUMIN 3.3*   No results found for this basename: LIPASE, AMYLASE,  in the last 168 hours No results found for this basename: AMMONIA,  in the last 168 hours CBC:  Recent Labs Lab 04/27/13 0859 04/28/13 1430 04/28/13 1921 04/29/13 0415  WBC 0.5* 0.2* 0.2* 0.2*  NEUTROABS 0.3* 0.1*  --   --   HGB 7.3* 7.6* 7.5* 6.7*  HCT 21.1* 21.3* 20.5* 19.0*  MCV 89.4 86.9 88.0 88.4  PLT 69* 41* 36* 27*   Cardiac Enzymes:  Recent Labs Lab 04/28/13 1430 04/28/13 1924 04/29/13 0050  TROPONINI <0.30 <0.30 <0.30   BNP  (last 3 results)  Recent Labs  02/14/13 1953 04/28/13 1430  PROBNP 290.3* 678.8*   CBG:  Recent Labs Lab 04/28/13 2117  GLUCAP 220*    Recent Results (from the past 240 hour(s))  MRSA PCR SCREENING     Status: None   Collection Time    04/28/13  6:55 PM      Result Value Range Status   MRSA by PCR NEGATIVE  NEGATIVE Final   Comment:            The GeneXpert MRSA Assay (FDA     approved for NASAL specimens     only), is one component of a     comprehensive MRSA colonization     surveillance program. It is not     intended to diagnose MRSA     infection nor to guide or     monitor treatment for     MRSA infections.     Studies: Dg Chest Port 1 View  04/29/2013   CLINICAL DATA:  Fever.  EXAM: PORTABLE CHEST - 1 VIEW  COMPARISON:  Chest CT 04/18/2013.  Chest x-ray 02/14/2013.  FINDINGS: Mediastinum and hilar structures are normal. Mild right middle lobe atelectatic changes are present. No pleural effusion or infiltrate. No mass lesion noted on today's examination. Previously identified left hilar mass is not identified. Heart size and pulmonary vascularity normal. No acute osseous abnormality.  IMPRESSION: 1. Previously identified left hilar mass no longer identified. 2. Mild right middle lobe atelectasis.   Electronically Signed   By: Maisie Fus  Register   On: 04/29/2013 07:18    Scheduled Meds: . aspirin EC  81 mg Oral Daily  . atorvastatin  40 mg Oral QHS  . ceFEPime (MAXIPIME) IV  2 g Intravenous Q8H  . clopidogrel  75 mg Oral Daily  . diltiazem  240 mg Oral Daily  . enoxaparin (LOVENOX) injection  40 mg Subcutaneous Q24H  . famotidine  20 mg Oral BID  . fentaNYL  25 mcg Transdermal Q72H  . insulin aspart  0-15 Units Subcutaneous TID WC  . insulin aspart  0-5 Units Subcutaneous QHS  . isosorbide mononitrate  60 mg Oral Daily  . LORazepam  1 mg Oral Q8H  . sodium chloride  3 mL Intravenous Q12H  . sucralfate  1 g Oral TID WC & HS   Continuous Infusions:   Active  Problems:   DM   HYPERLIPIDEMIA-MIXED   HYPERTENSION, UNSPECIFIED   CAD, NATIVE VESSEL   Small cell carcinoma of lung   Atrial fibrillation with RVR   A-fib    Time  spent: 30 min    Aira Sallade, [facility]  [facility] Pager [phone]. If 7PM-7AM, please contact night-coverage at [email or web address], password [facility] 04/29/2013, 8:13 AM  LOS: 1 day

## 2013-04-29 NOTE — Progress Notes (Signed)
Echo Lab  2D Echocardiogram completed.  Dolce Sylvia L Kristi Norment, RDCS 04/29/2013 9:54 AM

## 2013-04-29 NOTE — Progress Notes (Addendum)
ANTIBIOTIC CONSULT NOTE - INITIAL  Pharmacy Consult for Cefepime Indication: febrile neutropenia  Allergies  Allergen Reactions  . Erythromycin Other (See Comments)    Reaction while in hospital - pt doesn't know reaction  . Penicillins Other (See Comments)    Childhood reaction - unknown    Patient Measurements: Height: 5\' 10"  (177.8 cm) Weight: 213 lb 10 oz (96.9 kg) IBW/kg (Calculated) : 73  Vital Signs: Temp: 98.2 F (36.8 C) (12/19 0635) Temp src: Oral (12/19 0635) BP: 132/67 mmHg (12/19 0635) Pulse Rate: 106 (12/19 0635) Intake/Output from previous day: 12/18 0701 - 12/19 0700 In: 1124.3 [P.O.:960; I.V.:151.8; Blood:12.5] Out: 500 [Urine:500] Intake/Output from this shift: Total I/O In: 200 [Blood:200] Out: -   Labs:  Recent Labs  04/28/13 1430 04/28/13 1921 04/29/13 0415  WBC 0.2* 0.2* 0.2*  HGB 7.6* 7.5* 6.7*  PLT 41* 36* 27*  CREATININE 1.35 1.32 1.38*   Estimated Creatinine Clearance: 69 ml/min (by C-G formula based on Cr of 1.38). No results found for this basename: VANCOTROUGH, Leodis Binet, VANCORANDOM, GENTTROUGH, GENTPEAK, GENTRANDOM, TOBRATROUGH, TOBRAPEAK, TOBRARND, AMIKACINPEAK, AMIKACINTROU, AMIKACIN,  in the last 72 hours   Microbiology: Recent Results (from the past 720 hour(s))  MRSA PCR SCREENING     Status: None   Collection Time    04/28/13  6:55 PM      Result Value Range Status   MRSA by PCR NEGATIVE  NEGATIVE Final   Comment:            The GeneXpert MRSA Assay (FDA     approved for NASAL specimens     only), is one component of a     comprehensive MRSA colonization     surveillance program. It is not     intended to diagnose MRSA     infection nor to guide or     monitor treatment for     MRSA infections.    Medical History: Past Medical History  Diagnosis Date  . CAD (coronary artery disease)     a. PCI 3/08 to OM2 and mid CFX; b.  NSTEMI 6/10:  Prior stents patent, 90% dCFX,  mRCA occl (old) with collats; EF 55% on  LV-gram => Xience DES 2.5 x 23 mm to distal CFX;  c. abnl MV 12/13 => LHC 04/23/12:  pLAD 50%, oOM1 80%, long stented segment of the CFX extending into the PLOM with the PLOM totally occluded ostially, CFX 70% ISR, mCFX 80% ISR, pRCA chronically occluded. Med Rx planned  . Diabetes mellitus type II   . Hyperlipidemia   . COPD (chronic obstructive pulmonary disease)     Quit tobacco 09/2009; Gold Stage 2 with asthma component - FEV1 1.53 L/54%, 14% fev1 BD response, DLCO 54% - July 2011; MM genotype 01/07/10; unable to afford spiriva and unwilling to try ics due to prior renal failure: state to Dr. Marchelle Gearing, Aug 2011; started on atrovent HFA fall 2011. no desturation on walk test Dec 2011  . CKD (chronic kidney disease)   . Obesity   . History of CVA (cerebrovascular accident) 2007    Without residual defecits  . 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 no further rx  . CHF (congestive heart  failure)   . Myocardial infarction     x3  . Hypertension   . Asthma     as child  . Shortness of breath     constant/wears 2 liters  . Pneumonia     02/14/2013 in hospital  . Stroke     7 years- no problems   . Cancer     lung cancer-02/14/2013  . Blood transfusion without reported diagnosis      Assessment: 38 yoM with SCLC undergoing chemoradiation and h/o CAD presented to [facility] 12/18 with atrial fibrillation with RVR following blood transfusion at cancer center.  Started on IV diltiazem and admitted to step down unit.  Patient spiked fever of 101 last night.  Patient is neutropenic secondary to chemotherapy.  Starting empiric Cefepime for neutropenic fever.  Noted patient reported allergy to  penicillin with unknown childhood reaction.  Will proceed with Cefepime, low risk of cross sensitivity with 4th generation cephalosporin, and will have RN monitor.  WBC 0.2, ANC 0.1  Currently afebrile, Tmax 101  SCr 1.38, CrCl~80 ml/min (CG)  Goal of Therapy:  Doses adjusted per renal clearance, eradication of infection  Plan:  Cefepime 2g IV q8h.  Clance Boll 04/29/2013,7:24 AM

## 2013-04-29 NOTE — Progress Notes (Signed)
DIAGNOSIS: Small cell carcinoma of lung  Primary site: Lung (Left)  Staging method: AJCC 7th Edition  Clinical: Stage IIIA (T2a, N2, M0)  Summary: Stage IIIA (T2a, N2, M0)  Malignant neoplasm of lower lobe of left lung  Primary site: Lung (Left)  Staging method: AJCC 7th Edition  Summary: Incomplete stage   PRIOR THERAPY: None   CURRENT THERAPY: Systemic chemotherapy with carboplatin for an AUC of 5 given on day 1 and etoposide at 120 mg per meter squared given on days 1, 2 and 3 with Neulasta support given on day 4 given every 3 weeks. Status post 2 cycles.   DISEASE STAGE: Limited stage small cell lung cancer  Small cell carcinoma of lung  Primary site: Lung (Left)  Staging method: AJCC 7th Edition  Clinical: Stage IIIA (T2a, N2, M0)  Summary: Stage IIIA (T2a, N2, M0)  Malignant neoplasm of lower lobe of left lung  Primary site: Lung (Left)  Staging method: AJCC 7th Edition  Summary: Incomplete stage  CHEMOTHERAPY INTENT: Palliative  CURRENT # OF CHEMOTHERAPY CYCLES: 3  CURRENT ANTIEMETICS: Zofran, dexamethasone and Compazine  CURRENT SMOKING STATUS: Former smoker, quit 09/09/2009  ORAL CHEMOTHERAPY AND CONSENT: N./A  CURRENT BISPHOSPHONATES USE: None  PAIN MANAGEMENT: None  NARCOTICS INDUCED CONSTIPATION: None  LIVING WILL AND CODE STATUS: ?   Subjective: The patient is seen and examined today. [name] was admitted to [facility] yesterday with atrial fibrillation with RVR. [name] was started on treatment with IV diltiazem and this was later switched to oral medications. [name] was also found to have significant neutropenia with temperature of 101 last night.  [name] received 2 units of PRBCs transfusion 1 at the cancer Center and the other 1 after admission to the hospital. [name] is feeling a little bit better today.   Objective: Vital signs in last 24 hours: Temp:  [97.4 F (36.3 C)-101 F (38.3 C)] 98 F (36.7 C) (12/19 0835) Pulse Rate:  [95-141] 98 (12/19 0835) Resp:   [11-28] 19 (12/19 0835) BP: (92-140)/(51-91) 140/68 mmHg (12/19 0835) SpO2:  [96 %-100 %] 96 % (12/19 0835) Weight:  [213 lb 10 oz (96.9 kg)] 213 lb 10 oz (96.9 kg) (12/18 2015)  Intake/Output from previous day: 12/18 0701 - 12/19 0700 In: 1124.3 [P.O.:960; I.V.:151.8; Blood:12.5] Out: 500 [Urine:500] Intake/Output this shift: Total I/O In: 306.7 [Blood:306.7] Out: -   General appearance: alert, cooperative, fatigued and no distress Resp: clear to auscultation bilaterally Cardio: regular rate and rhythm, S1, S2 normal, no murmur, click, rub or gallop GI: soft, non-tender; bowel sounds normal; no masses,  no organomegaly Extremities: extremities normal, atraumatic, no cyanosis or edema  Lab Results:   Recent Labs  04/28/13 1921 04/29/13 0415  WBC 0.2* 0.2*  HGB 7.5* 6.7*  HCT 20.5* 19.0*  PLT 36* 27*   BMET  Recent Labs  04/28/13 1430 04/28/13 1921 04/29/13 0415  NA 133*  --  130*  K 4.1  --  4.1  CL 96  --  95*  CO2 25  --  26  GLUCOSE 171*  --  216*  BUN 23  --  23  CREATININE 1.35 1.32 1.38*  CALCIUM 9.1  --  8.7    Studies/Results: Dg Chest Port 1 View  04/29/2013   CLINICAL DATA:  Fever.  EXAM: PORTABLE CHEST - 1 VIEW  COMPARISON:  Chest CT 04/18/2013.  Chest x-ray 02/14/2013.  FINDINGS: Mediastinum and hilar structures are normal. Mild right middle lobe atelectatic changes are present. No pleural  effusion or infiltrate. No mass lesion noted on today's examination. Previously identified left hilar mass is not identified. Heart size and pulmonary vascularity normal. No acute osseous abnormality.  IMPRESSION: 1. Previously identified left hilar mass no longer identified. 2. Mild right middle lobe atelectasis.   Electronically Signed   By: Maisie Fus  Register   On: 04/29/2013 07:18    Medications: I have reviewed the patient's current medications.   Assessment/Plan: 1) limited stage small cell lung cancer: Status post 3 cycles of systemic chemotherapy with  carboplatin and etoposide concurrent with radiation. [name] had significant improvement in his disease after cycle #2. We will resume his treatment with cycle #4 of improvement in his condition. The next cycle is scheduled for 05/10/2013. 2) chemotherapy-induced anemia: [name] received 2 units of PRBCs transfusion. We will consider additional transfusion to keep his hemoglobin of 8.0 G./DL. 3) chemotherapy-induced thrombocytopenia: We will monitor for now and consider the patient for platelet transfusion if his platelets count is less than 20,000 or if [name] has any bleeding issues. 4) chemotherapy-induced neutropenia: Continue empiric antibiotic. I will start the patient on Neupogen 480 mcg subcutaneously for the next few days until his absolute neutrophil count is over 1000. 5) atrial fibrillation: Continue current treatment per cardiology. Thank you so much for taking good care of [name]. I will continue to follow up the patient with you and assist in his management on as-needed basis.  LOS: 1 day    [name] K. 04/29/2013

## 2013-04-30 LAB — COMPREHENSIVE METABOLIC PANEL
ALT: 10 U/L (ref 0–53)
AST: 8 U/L (ref 0–37)
Albumin: 3 g/dL — ABNORMAL LOW (ref 3.5–5.2)
Alkaline Phosphatase: 79 U/L (ref 39–117)
Chloride: 96 mEq/L (ref 96–112)
Potassium: 4.1 mEq/L (ref 3.5–5.1)
Sodium: 132 mEq/L — ABNORMAL LOW (ref 135–145)
Total Bilirubin: 1.4 mg/dL — ABNORMAL HIGH (ref 0.3–1.2)

## 2013-04-30 LAB — GLUCOSE, CAPILLARY: Glucose-Capillary: 145 mg/dL — ABNORMAL HIGH (ref 70–99)

## 2013-04-30 LAB — URINALYSIS, ROUTINE W REFLEX MICROSCOPIC
Bilirubin Urine: NEGATIVE
Glucose, UA: 100 mg/dL — AB
Hgb urine dipstick: NEGATIVE
Ketones, ur: NEGATIVE mg/dL
Leukocytes, UA: NEGATIVE
Nitrite: NEGATIVE
Protein, ur: 30 mg/dL — AB
Specific Gravity, Urine: 1.023 (ref 1.005–1.030)
Urobilinogen, UA: 0.2 mg/dL (ref 0.0–1.0)
pH: 6 (ref 5.0–8.0)

## 2013-04-30 LAB — CBC WITH DIFFERENTIAL/PLATELET
Basophils Absolute: 0 10*3/uL (ref 0.0–0.1)
Eosinophils Relative: 6 % — ABNORMAL HIGH (ref 0–5)
Lymphocytes Relative: 56 % — ABNORMAL HIGH (ref 12–46)
Lymphs Abs: 0.1 10*3/uL — ABNORMAL LOW (ref 0.7–4.0)
MCH: 30.8 pg (ref 26.0–34.0)
MCHC: 35.6 g/dL (ref 30.0–36.0)
MCV: 86.6 fL (ref 78.0–100.0)
Monocytes Absolute: 0.1 10*3/uL (ref 0.1–1.0)
Platelets: 12 10*3/uL — CL (ref 150–400)
RDW: 14.6 % (ref 11.5–15.5)

## 2013-04-30 LAB — URINE MICROSCOPIC-ADD ON

## 2013-04-30 MED ORDER — DIPHENHYDRAMINE-ZINC ACETATE 2-0.1 % EX CREA
TOPICAL_CREAM | Freq: Three times a day (TID) | CUTANEOUS | Status: DC | PRN
  Filled 2013-04-30: qty 28

## 2013-04-30 MED ORDER — LORAZEPAM 2 MG/ML IJ SOLN
1.0000 mg | INTRAMUSCULAR | Status: DC | PRN
  Administered 2013-04-30 – 2013-05-02 (×5): 1 mg via INTRAVENOUS
  Filled 2013-04-30 (×5): qty 1

## 2013-04-30 MED ORDER — DIPHENHYDRAMINE HCL 25 MG PO CAPS
25.0000 mg | ORAL_CAPSULE | Freq: Four times a day (QID) | ORAL | Status: DC | PRN
  Administered 2013-04-30 – 2013-05-03 (×4): 25 mg via ORAL
  Filled 2013-04-30 (×4): qty 1

## 2013-04-30 MED ORDER — VANCOMYCIN HCL IN DEXTROSE 1-5 GM/200ML-% IV SOLN
1000.0000 mg | Freq: Two times a day (BID) | INTRAVENOUS | Status: DC
  Administered 2013-04-30 – 2013-05-02 (×4): 1000 mg via INTRAVENOUS
  Filled 2013-04-30 (×5): qty 200

## 2013-04-30 MED ORDER — LORAZEPAM 1 MG PO TABS
1.0000 mg | ORAL_TABLET | ORAL | Status: DC | PRN
  Administered 2013-05-02 – 2013-05-03 (×2): 1 mg via ORAL
  Filled 2013-04-30 (×2): qty 1

## 2013-04-30 MED ORDER — MORPHINE SULFATE 2 MG/ML IJ SOLN
2.0000 mg | INTRAMUSCULAR | Status: DC | PRN
  Administered 2013-04-30 – 2013-05-02 (×8): 4 mg via INTRAVENOUS
  Administered 2013-05-02 – 2013-05-05 (×3): 2 mg via INTRAVENOUS
  Filled 2013-04-30 (×3): qty 2
  Filled 2013-04-30 (×2): qty 1
  Filled 2013-04-30: qty 2
  Filled 2013-04-30: qty 1
  Filled 2013-04-30 (×5): qty 2

## 2013-04-30 NOTE — Progress Notes (Signed)
Pt with hgb of 6.9 plt of 12 and wbc of .2 midlevel paged awaiting call back.

## 2013-04-30 NOTE — Progress Notes (Signed)
Pt has mdi at bedside that he uses. Pt refused to give to nursing staff. Charge nurse informed.

## 2013-04-30 NOTE — Evaluation (Signed)
Clinical/Bedside Swallow Evaluation Patient Details  Name: [name] MRN: [id number] Date of Birth: 11-06-1955  Today's Date: 04/30/2013 Time: 1300-1330 SLP Time Calculation (min): 30 min  Past Medical History:  Past Medical History  Diagnosis Date  . CAD (coronary artery disease)     a. PCI 3/08 to OM2 and mid CFX; b.  NSTEMI 6/10:  Prior stents patent, 90% dCFX,  mRCA occl (old) with collats; EF 55% on LV-gram => Xience DES 2.5 x 23 mm to distal CFX;  c. abnl MV 12/13 => LHC 04/23/12:  pLAD 50%, oOM1 80%, long stented segment of the CFX extending into the PLOM with the PLOM totally occluded ostially, CFX 70% ISR, mCFX 80% ISR, pRCA chronically occluded. Med Rx planned  . Diabetes mellitus type II   . Hyperlipidemia   . COPD (chronic obstructive pulmonary disease)     Quit tobacco 09/2009; Gold Stage 2 with asthma component - FEV1 1.53 L/54%, 14% fev1 BD response, DLCO 54% - July 2011; MM genotype 01/07/10; unable to afford spiriva and unwilling to try ics due to prior renal failure: state to Dr. Marchelle Gearing, Aug 2011; started on atrovent HFA fall 2011. no desturation on walk test Dec 2011  . CKD (chronic kidney disease)   . Obesity   . History of CVA (cerebrovascular accident) 2007    Without residual defecits  . 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 no further rx  . CHF (congestive heart failure)   . Myocardial infarction     x3  . Hypertension   . Asthma     as child  . Shortness of breath     constant/wears 2 liters  . Pneumonia     02/14/2013 in hospital  . Stroke     7 years- no problems   . Cancer     lung cancer-02/14/2013  . Blood transfusion without reported diagnosis     Past Surgical History:  Past Surgical History  Procedure Laterality Date  . Wedge resection  03/28/2010  . Coronary stent placement  2009  . Cardiac catheterization  2009  . Endobronchial ultrasound Bilateral 02/28/2013    Procedure: ENDOBRONCHIAL ULTRASOUND;  Surgeon: Kalman Shan, MD;  Location: [facility] ENDOSCOPY;  Service: Cardiopulmonary;  Laterality: Bilateral;   HPI:  57 year old male admitted 04/28/13 due to increased heart rate.  PMH significant for Lung CA, s/p chemo and radiation, CAD, DM, COPD, CVA (2007), CHF, MIx3, HTN, PNA (10/14). BSE orders received to evaluate swallow function and safety.   Assessment / Plan / Recommendation Clinical Impression  Pt unable to tolerate solid consistencies due to significant burning discomfort after the swallow. Pt indicates pain from his stomach all the way up to his throat.  RN present to provide meds.  Initial pill caused pt pain, so subsequent pill was crushed with puree.  Pt reported this to be more comfortable.  No overt s/s aspiration observed or reported.  H&P indicated radiation esophagitis, so esophageal workup may be beneficial.  Discussed with pt changing diet to full liquid to be easier on his throat and esophagus.  Oropharyngeal dysphagia is not anticipated, so ST will not continue following pt.  SLP available if needs arise, however.  Aspiration Risk  Mild    Diet Recommendation Thin liquid (full liquid)   Liquid Administration via: Cup;Straw Medication Administration: Crushed with puree Supervision: Patient able to self feed;Intermittent supervision to cue for compensatory strategies Compensations: Slow rate;Small sips/bites;Follow solids with liquid Postural Changes and/or Swallow Maneuvers: Seated upright 90 degrees;Upright 30-60 min after meal    Other  Recommendations Recommended Consults: Consider GI evaluation;Consider esophageal assessment Oral Care Recommendations: Oral care BID Other Recommendations: Clarify  dietary restrictions   Follow Up Recommendations  None    Frequency and Duration        Pertinent Vitals/Pain VSS, throat/esophageal pain/burning. RN aware.    SLP Swallow Goals  n/a   Swallow Study Prior Functional Status   No history of swallowing difficulty.    General Date of Onset: 04/28/13 HPI: 57 year old male admitted 04/28/13 due to increased heart rate.  PMH significant for Lung CA, s/p chemo and radiation, CAD, DM, COPD, CVA (2007), CHF, MIx3, HTN, PNA (10/14). BSE orders received to evaluate swallow function and safety. Type of Study: Bedside swallow evaluation Previous Swallow Assessment: none found Diet Prior to this Study: Regular;Thin liquids Temperature Spikes Noted: No Respiratory Status: Nasal cannula History of Recent Intubation: No Behavior/Cognition: Alert;Confused;Cooperative Oral Cavity - Dentition: Adequate natural dentition Self-Feeding Abilities: Able to feed self;Needs assist Patient Positioning: Upright in chair Baseline Vocal Quality: Clear Volitional Cough: Strong Volitional Swallow: Able to elicit    Oral/Motor/Sensory Function Overall Oral Motor/Sensory Function: Appears within functional limits for tasks assessed   Ice Chips Ice chips: Not tested   Thin Liquid Thin Liquid: Within functional limits Presentation: Straw    Nectar Thick Nectar Thick Liquid: Not tested   Honey Thick Honey Thick Liquid: Not tested   Puree Puree: Within functional limits Presentation: Spoon   Solid   GO   Celia B. Murvin Natal [facility], CCC-SLP [phone] Solid: Not tested       Leigh Aurora 04/30/2013,1:31 PM

## 2013-04-30 NOTE — Progress Notes (Signed)
[facility] PROGRESS NOTE  [name]:[id number] DOB: 1956-04-19 DOA: 04/28/2013 PCP: Clydell Hakim, MD  Assessment/Plan  A-fib with RVR, resolved, currently sinus tachycardia -  Appreciate cardiology assistance -  Troponins negative -  Continue diltiazem and metoprolol -  Followup echocardiogram:  Mild LVH, and suture fraction 65%, could not exclude regional wall motion abnormalities, aortic and mitral valves appeared grossly normal, the right ventricle was poorly visualized. -  Agree that anemia is likely contributing to tachycardia:  Amenable to transfusion this morning -  May also have component of anxiety:  Increase ativan -  TSH  Febrile neutropenia with fever to 101 Fahrenheit, trending down, unclear source however developing erythema on his back and has some skin breakdown where his radiation site is -  Blood cultures x2, no growth to date -  Urinalysis pending -  Chest x-ray unremarkable -  Continue cefepime -  Add vancomycin for possible cellulitis -  Appreciate hematology and oncology recommendations -  Anticipate transitioning to oral medication like levofloxacin in few days the fevers resolve  Pancytopenia due to chemotherapy, received Neulasta at cancer Center, hemoglobin less than 7, platelets less than 20 and falling, no schistocytes on smear to suggest DIC -  Receiving Neupogen -  Transfused 1 unit PRBCs 12/19 -  Transfuse 2 units PRBCs and 2 packs of platelets -  Continue to hold aspirin, Plavix, and Lovenox due to low platelets and high risk of bleeding  Type 2 diabetes mellitus, CBG grossly within range - CBG stable -  Hold glipizide -  Continue sliding scale insulin  Radiation esophagitis -  Start famotidine and Carafate -  Patient states that Magic mouthwash has not helped in the past -  Continue fentanyl patch and hydrocodone when necessary  Hypertension, BP wnl this am -  Continue Cardizem  Coronary artery disease with chest pain  which is chronic. This pain is likely not due to ACS -  EKG without ischemic changes -  Troponins negative   Diet:  Diabetic  Access:  PIV  IVF:  Yes  Proph:  SCD  Code Status: Full  Family Communication: Spoke to patient alone  Disposition Plan:  Pending medically stable.     Consultants:  Cardiology, Doctor Excell Seltzer  Oncology  Procedures:  Chest x-ray  Blood transfusion  Antibiotics:  Cefepime 12/19   HPI/Subjective:  Patient continues to have chest pain with swallowing which is chronic and stable.  Mid-thoracic back area is becoming more itchy.     Objective: Filed Vitals:   04/30/13 0400 04/30/13 0515 04/30/13 0800 04/30/13 1016  BP:  115/67  124/66  Pulse:  102  118  Temp: 97.7 F (36.5 C)  98.9 F (37.2 C)   TempSrc: Oral  Axillary   Resp:  13    Height:      Weight:      SpO2:  98%      Intake/Output Summary (Last 24 hours) at 04/30/13 1052 Last data filed at 04/30/13 0558  Gross per 24 hour  Intake   1810 ml  Output    900 ml  Net    910 ml   Filed Weights   04/28/13 2015  Weight: 96.9 kg (213 lb 10 oz)    Exam:   General:   overweight Caucasian male, No acute distress, Alopecia   HEENT:  NCAT, MMM, no thrush  Cardiovascular:   tachycardic, RR, nl S1, S2 no mrg, 2+ pulses, warm extremities  Respiratory:  CTAB, no increased WOB  Abdomen:   NABS, soft, NT/ND  MSK:   Normal tone and bulk, no LEE  Neuro:  Grossly intact  Skin:  Hand-sized area mid-back with hyperpigmentation, but also some light erythema and excoriations with skin breakdown  Psych:  Anxious appearing  Data Reviewed: Basic Metabolic Panel:  Recent Labs Lab 04/27/13 0859 04/28/13 1430 04/28/13 1921 04/29/13 0415 04/30/13 0352  NA 134* 133*  --  130* 132*  K 4.7 4.1  --  4.1 4.1  CL  --  96  --  95* 96  CO2 28 25  --  26 26  GLUCOSE 212* 171*  --  216* 158*  BUN 23.3 23  --  23 22  CREATININE 1.4* 1.35 1.32 1.38* 1.39*  CALCIUM 9.6 9.1  --  8.7 8.8    Liver Function Tests:  Recent Labs Lab 04/27/13 0859 04/30/13 0352  AST 16 8  ALT 15 10  ALKPHOS 95 79  BILITOT 1.40* 1.4*  PROT 6.4 6.2  ALBUMIN 3.3* 3.0*   No results found for this basename: LIPASE, AMYLASE,  in the last 168 hours No results found for this basename: AMMONIA,  in the last 168 hours CBC:  Recent Labs Lab 04/27/13 0859 04/28/13 1430 04/28/13 1921 04/29/13 0415 04/29/13 1045 04/30/13 0352  WBC 0.5* 0.2* 0.2* 0.2* 0.1* 0.2*  NEUTROABS 0.3* 0.1*  --   --   --  0.0*  HGB 7.3* 7.6* 7.5* 6.7* 7.3* 6.9*  HCT 21.1* 21.3* 20.5* 19.0* 19.9* 19.4*  MCV 89.4 86.9 88.0 88.4 86.1 86.6  PLT 69* 41* 36* 27* 25* 12*   Cardiac Enzymes:  Recent Labs Lab 04/28/13 1430 04/28/13 1924 04/29/13 0050 04/29/13 1045  TROPONINI <0.30 <0.30 <0.30 <0.30   BNP (last 3 results)  Recent Labs  02/14/13 1953 04/28/13 1430  PROBNP 290.3* 678.8*   CBG:  Recent Labs Lab 04/29/13 0746 04/29/13 1223 04/29/13 1645 04/29/13 2140 04/30/13 0753  GLUCAP 234* 150* 202* 100* 167*    Recent Results (from the past 240 hour(s))  MRSA PCR SCREENING     Status: None   Collection Time    04/28/13  6:55 PM      Result Value Range Status   MRSA by PCR NEGATIVE  NEGATIVE Final   Comment:            The GeneXpert MRSA Assay (FDA     approved for NASAL specimens     only), is one component of a     comprehensive MRSA colonization     surveillance program. It is not     intended to diagnose MRSA     infection nor to guide or     monitor treatment for     MRSA infections.     Studies: Dg Chest Port 1 View  04/29/2013   CLINICAL DATA:  Fever.  EXAM: PORTABLE CHEST - 1 VIEW  COMPARISON:  Chest CT 04/18/2013.  Chest x-ray 02/14/2013.  FINDINGS: Mediastinum and hilar structures are normal. Mild right middle lobe atelectatic changes are present. No pleural effusion or infiltrate. No mass lesion noted on today's examination. Previously identified left hilar mass is not  identified. Heart size and pulmonary vascularity normal. No acute osseous abnormality.  IMPRESSION: 1. Previously identified left hilar mass no longer identified. 2. Mild right middle lobe atelectasis.   Electronically Signed   By: Maisie Fus  Register   On: 04/29/2013 07:18    Scheduled Meds: . atorvastatin  40 mg Oral QHS  . ceFEPime (MAXIPIME)  IV  2 g Intravenous Q8H  . diltiazem  240 mg Oral Daily  . famotidine  20 mg Oral BID  . fentaNYL  25 mcg Transdermal Q72H  . filgrastim  480 mcg Subcutaneous Daily  . insulin aspart  0-15 Units Subcutaneous TID WC  . insulin aspart  0-5 Units Subcutaneous QHS  . insulin detemir  10 Units Subcutaneous Daily  . isosorbide mononitrate  60 mg Oral Daily  . metoprolol tartrate  25 mg Oral BID  . sodium chloride  3 mL Intravenous Q12H  . sucralfate  1 g Oral TID WC & HS  . vancomycin  1,000 mg Intravenous Q12H   Continuous Infusions:   Active Problems:   DM   HYPERLIPIDEMIA-MIXED   HYPERTENSION, UNSPECIFIED   CAD, NATIVE VESSEL   Small cell carcinoma of lung   Atrial fibrillation with RVR   A-fib   Febrile neutropenia   Pancytopenia due to chemotherapy    Time spent: 30 min    Cloyce Paterson, [facility]  [facility] Pager [phone]. If 7PM-7AM, please contact night-coverage at [email or web address], password [facility] 04/30/2013, 10:52 AM  LOS: 2 days

## 2013-04-30 NOTE — Progress Notes (Signed)
Pt c/o increasing pain. Morphine not helping. Pt also has radiation burns that are causing him to itch. [employer]level paged awaiting call back.

## 2013-04-30 NOTE — Progress Notes (Signed)
Patient Name: [name] Date of Encounter: 04/30/2013     Active Problems:   DM   HYPERLIPIDEMIA-MIXED   HYPERTENSION, UNSPECIFIED   CAD, NATIVE VESSEL   Small cell carcinoma of lung   Atrial fibrillation with RVR   A-fib   Febrile neutropenia   Pancytopenia due to chemotherapy    SUBJECTIVE  No complaints except painful swallowing. Rhythm is sinus tachycardia.  CURRENT MEDS . atorvastatin  40 mg Oral QHS  . ceFEPime (MAXIPIME) IV  2 g Intravenous Q8H  . diltiazem  240 mg Oral Daily  . famotidine  20 mg Oral BID  . fentaNYL  25 mcg Transdermal Q72H  . filgrastim  480 mcg Subcutaneous Daily  . insulin aspart  0-15 Units Subcutaneous TID WC  . insulin aspart  0-5 Units Subcutaneous QHS  . insulin detemir  10 Units Subcutaneous Daily  . isosorbide mononitrate  60 mg Oral Daily  . LORazepam  1 mg Oral Q8H  . metoprolol tartrate  25 mg Oral BID  . sodium chloride  3 mL Intravenous Q12H  . sucralfate  1 g Oral TID WC & HS    OBJECTIVE  Filed Vitals:   04/30/13 0200 04/30/13 0400 04/30/13 0515 04/30/13 0800  BP: 114/65  115/67   Pulse:   102   Temp:  97.7 F (36.5 C)  98.9 F (37.2 C)  TempSrc:  Oral  Axillary  Resp: 22  13   Height:      Weight:      SpO2: 97%  98%     Intake/Output Summary (Last 24 hours) at 04/30/13 0829 Last data filed at 04/30/13 0558  Gross per 24 hour  Intake 2091.17 ml  Output   1150 ml  Net 941.17 ml   Filed Weights   04/28/13 2015  Weight: 213 lb 10 oz (96.9 kg)    PHYSICAL EXAM  General: Pleasant, NAD. Neuro: Alert and oriented X 3. Moves all extremities spontaneously. Psych: Normal affect. HEENT:  Normal  Neck: Supple without bruits or JVD. Lungs:  Resp regular and unlabored, CTA. Heart: RRR no s3, s4, or murmurs.Tachycardia. Abdomen: Soft, non-tender, non-distended, BS + x 4.  Extremities: No clubbing, cyanosis or edema. DP/PT/Radials 2+ and equal bilaterally.  Accessory Clinical Findings  CBC  Recent  Labs  04/28/13 1430  04/29/13 1045 04/30/13 0352  WBC 0.2*  < > 0.1* 0.2*  NEUTROABS 0.1*  --   --  0.0*  HGB 7.6*  < > 7.3* 6.9*  HCT 21.3*  < > 19.9* 19.4*  MCV 86.9  < > 86.1 86.6  PLT 41*  < > 25* 12*  < > = values in this interval not displayed. Basic Metabolic Panel  Recent Labs  04/29/13 0415 04/30/13 0352  NA 130* 132*  K 4.1 4.1  CL 95* 96  CO2 26 26  GLUCOSE 216* 158*  BUN 23 22  CREATININE 1.38* 1.39*  CALCIUM 8.7 8.8   Liver Function Tests  Recent Labs  04/27/13 0859 04/30/13 0352  AST 16 8  ALT 15 10  ALKPHOS 95 79  BILITOT 1.40* 1.4*  PROT 6.4 6.2  ALBUMIN 3.3* 3.0*   No results found for this basename: LIPASE, AMYLASE,  in the last 72 hours Cardiac Enzymes  Recent Labs  04/28/13 1924 04/29/13 0050 04/29/13 1045  TROPONINI <0.30 <0.30 <0.30   BNP No components found with this basename: POCBNP,  D-Dimer No results found for this basename: DDIMER,  in the last  72 hours Hemoglobin A1C  Recent Labs  04/28/13 1921  HGBA1C 8.4*   Fasting Lipid Panel No results found for this basename: CHOL, HDL, LDLCALC, TRIG, CHOLHDL, LDLDIRECT,  in the last 72 hours Thyroid Function Tests No results found for this basename: TSH, T4TOTAL, FREET3, T3FREE, THYROIDAB,  in the last 72 hours  TELE  Sinus tachycardia.  ECG    Radiology/Studies  Ct Chest W Contrast  04/18/2013   CLINICAL DATA:  Small cell lung cancer diagnosed 02/2013, chemotherapy and XRT in progress, chest pain, cough, shortness of breath  EXAM: CT CHEST WITH CONTRAST  TECHNIQUE: Multidetector CT imaging of the chest was performed during intravenous contrast administration.  CONTRAST:  80mL OMNIPAQUE IOHEXOL 300 MG/ML  SOLN  COMPARISON:  PET-CT dated 02/24/2013.  CT chest dated 02/14/2013.  FINDINGS: Radiation changes in the superior segment left lower lobe. Prior left lower lobe pulmonary nodule/mass has essentially resolved.  Underlying moderate to severe centrilobular and paraseptal  emphysematous changes. Postsurgical changes related to prior right lung wedge resection. No pleural effusion or pneumothorax.  Visualized thyroid is unremarkable.  The heart is normal in size. No pericardial effusion. Coronary atherosclerosis. Atherosclerotic calcifications of the aortic arch.  Improving thoracic lymphadenopathy, including:  --9 mm short axis prevascular node (series 2/ image 22), previously 17 mm  --14 mm AP window node (series 2/image 28), previously 26 mm  --10 mm short axis subcarinal node (series 2/ image 30), previously 12 mm  --12 mm short axis left hilar node (series 2/ image 32), previously 25 mm  Visualized upper abdomen is unremarkable.  Degenerative changes of the visualized thoracolumbar spine. Multiple right rib deformities related to prior thoracotomy.  IMPRESSION: Radiation changes in the superior segment left lower lobe. Postsurgical changes related to prior right lung wedge resection.  Prior left lower lobe pulmonary nodule/mass has essentially resolved.  Improving thoracic lymphadenopathy, as described above.   Electronically Signed   By: Charline Bills M.D.   On: 04/18/2013 09:37   Dg Chest Port 1 View  04/29/2013   CLINICAL DATA:  Fever.  EXAM: PORTABLE CHEST - 1 VIEW  COMPARISON:  Chest CT 04/18/2013.  Chest x-ray 02/14/2013.  FINDINGS: Mediastinum and hilar structures are normal. Mild right middle lobe atelectatic changes are present. No pleural effusion or infiltrate. No mass lesion noted on today's examination. Previously identified left hilar mass is not identified. Heart size and pulmonary vascularity normal. No acute osseous abnormality.  IMPRESSION: 1. Previously identified left hilar mass no longer identified. 2. Mild right middle lobe atelectasis.   Electronically Signed   By: Maisie Fus  Register   On: 04/29/2013 07:18  2D echo:  Study Conclusions  - Left ventricle: Technically limited study. The cavity size was normal. Wall thickness was increased in a  pattern of mild LVH. The estimated ejection fraction was 65%. Regional wall motion abnormalities cannot be excluded. - Aortic valve: Poorly visualized. The valve appears to be grossly normal. - Mitral valve: Poorly visualized. The valve appears to be grossly normal. - Right ventricle: Poorly visualized. Systolic function was normal.  ASSESSMENT AND PLAN 1. Atrial fibrillation with rapid ventricular response: Pt has converted to sinus. On oral cardizem. Probably a good idea to stay on oral diltiazem long term if he tolerates. His CHADS-Vasc score is 5 (CAD, HTN, hx Stroke 2, and diabetes). In the absence of contraindications, would recommend anticoagulation, however, with ongoing chemotherapy and severe pancytopenia, would not recommend aspirin or oral anticoagulation at this time. Echo shows normal systolic function.  2. Coronary artery disease, native vessel: Continue observation. No evidence of anginal symptoms. His constant chest pain is likely related to the effects of radiation therapy. Troponin levels are normal.  3. Hypertension: Continue cardizem. 4. Anemia: Per primary. Anemia contributing to his tachycardia.    Signed, Cassell Clement  MD

## 2013-04-30 NOTE — Progress Notes (Signed)
ANTIBIOTIC CONSULT NOTE - INITIAL  Pharmacy Consult for Vancomycin Indication: cellulitis  Allergies  Allergen Reactions  . Erythromycin Other (See Comments)    Reaction while in hospital - pt doesn't know reaction  . Penicillins Other (See Comments)    Childhood reaction - unknown    Patient Measurements: Height: 5\' 10"  (177.8 cm) Weight: 213 lb 10 oz (96.9 kg) IBW/kg (Calculated) : 73  Vital Signs: Temp: 98.9 F (37.2 C) (12/20 0800) Temp src: Axillary (12/20 0800) BP: 124/66 mmHg (12/20 1016) Pulse Rate: 118 (12/20 1016) Intake/Output from previous day: 12/19 0701 - 12/20 0700 In: 2572.8 [P.O.:1905; I.V.:21.2; Blood:306.7; IV Piggyback:150] Out: 1150 [Urine:1150] Intake/Output from this shift:    Labs:  Recent Labs  04/28/13 1921 04/29/13 0415 04/29/13 1045 04/30/13 0352  WBC 0.2* 0.2* 0.1* 0.2*  HGB 7.5* 6.7* 7.3* 6.9*  PLT 36* 27* 25* 12*  CREATININE 1.32 1.38*  --  1.39*   Estimated Creatinine Clearance: 68.5 ml/min (by C-G formula based on Cr of 1.39). No results found for this basename: VANCOTROUGH, Leodis Binet, VANCORANDOM, GENTTROUGH, GENTPEAK, GENTRANDOM, TOBRATROUGH, TOBRAPEAK, TOBRARND, AMIKACINPEAK, AMIKACINTROU, AMIKACIN,  in the last 72 hours   Microbiology: Recent Results (from the past 720 hour(s))  MRSA PCR SCREENING     Status: None   Collection Time    04/28/13  6:55 PM      Result Value Range Status   MRSA by PCR NEGATIVE  NEGATIVE Final   Comment:            The GeneXpert MRSA Assay (FDA     approved for NASAL specimens     only), is one component of a     comprehensive MRSA colonization     surveillance program. It is not     intended to diagnose MRSA     infection nor to guide or     monitor treatment for     MRSA infections.    Medical History: Past Medical History  Diagnosis Date  . CAD (coronary artery disease)     a. PCI 3/08 to OM2 and mid CFX; b.  NSTEMI 6/10:  Prior stents patent, 90% dCFX,  mRCA occl (old) with  collats; EF 55% on LV-gram => Xience DES 2.5 x 23 mm to distal CFX;  c. abnl MV 12/13 => LHC 04/23/12:  pLAD 50%, oOM1 80%, long stented segment of the CFX extending into the PLOM with the PLOM totally occluded ostially, CFX 70% ISR, mCFX 80% ISR, pRCA chronically occluded. Med Rx planned  . Diabetes mellitus type II   . Hyperlipidemia   . COPD (chronic obstructive pulmonary disease)     Quit tobacco 09/2009; Gold Stage 2 with asthma component - FEV1 1.53 L/54%, 14% fev1 BD response, DLCO 54% - July 2011; MM genotype 01/07/10; unable to afford spiriva and unwilling to try ics due to prior renal failure: state to Dr. Marchelle Gearing, Aug 2011; started on atrovent HFA fall 2011. no desturation on walk test Dec 2011  . CKD (chronic kidney disease)   . Obesity   . History of CVA (cerebrovascular accident) 2007    Without residual defecits  . 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 no further rx  .  CHF (congestive heart failure)   . Myocardial infarction     x3  . Hypertension   . Asthma     as child  . Shortness of breath     constant/wears 2 liters  . Pneumonia     02/14/2013 in hospital  . Stroke     7 years- no problems   . Cancer     lung cancer-02/14/2013  . Blood transfusion without reported diagnosis      Assessment: 37 yoM with SCLC undergoing chemoradiation and h/o CAD presented to [facility] 12/18 with atrial fibrillation with RVR following blood transfusion at cancer center.  Started on IV diltiazem and admitted to step down unit.  Patient spiked fever on admission and was started on Cefepime empirically for neutropenic fever.  Patient is neutropenic secondary to chemotherapy.  MD now  concerned for an early cellulitis developing on his back and ordering vancomycin.   Day #2 Cefepime 1g IV q8h.  Tmax 100.4  WBC 0.2, ANC 0  SCr 1.39 (has been relatively stable), CrCl~79 ml/min (CG), CrCl~59 ml/min (normalized)  12/19 blood cultures ordered  Goal of Therapy:  Vancomycin trough 10-15 mcg/ml Doses adjusted per renal clearance, eradication of infection  Plan:  1.  Vancomycin 1g IV q12h. 2.  F/u SCr, trough levels for dose adjustments.  Clance Boll 04/30/2013,10:36 AM

## 2013-05-01 ENCOUNTER — Inpatient Hospital Stay (HOSPITAL_COMMUNITY): Payer: BC Managed Care – PPO

## 2013-05-01 DIAGNOSIS — D61818 Other pancytopenia: Secondary | ICD-10-CM

## 2013-05-01 DIAGNOSIS — J449 Chronic obstructive pulmonary disease, unspecified: Secondary | ICD-10-CM

## 2013-05-01 LAB — TYPE AND SCREEN
Antibody Screen: NEGATIVE
Unit division: 0
Unit division: 0
Unit division: 0

## 2013-05-01 LAB — CBC WITH DIFFERENTIAL/PLATELET
Basophils Absolute: 0 10*3/uL (ref 0.0–0.1)
Basophils Relative: 2 % — ABNORMAL HIGH (ref 0–1)
Eosinophils Relative: 4 % (ref 0–5)
MCH: 30.8 pg (ref 26.0–34.0)
Monocytes Absolute: 0.1 10*3/uL (ref 0.1–1.0)
Monocytes Relative: 25 % — ABNORMAL HIGH (ref 3–12)
Neutrophils Relative %: 42 % — ABNORMAL LOW (ref 43–77)
Platelets: 57 10*3/uL — ABNORMAL LOW (ref 150–400)
RBC: 2.73 MIL/uL — ABNORMAL LOW (ref 4.22–5.81)
WBC: 0.5 10*3/uL — CL (ref 4.0–10.5)

## 2013-05-01 LAB — GLUCOSE, CAPILLARY
Glucose-Capillary: 95 mg/dL (ref 70–99)
Glucose-Capillary: 159 mg/dL — ABNORMAL HIGH (ref 70–99)

## 2013-05-01 LAB — COMPREHENSIVE METABOLIC PANEL
BUN: 20 mg/dL (ref 6–23)
CO2: 26 mEq/L (ref 19–32)
Chloride: 99 mEq/L (ref 96–112)
Creatinine, Ser: 1.31 mg/dL (ref 0.50–1.35)
GFR calc Af Amer: 68 mL/min — ABNORMAL LOW (ref >90)
GFR calc non Af Amer: 59 mL/min — ABNORMAL LOW (ref >90)
Glucose, Bld: 111 mg/dL — ABNORMAL HIGH (ref 70–99)
Total Bilirubin: 3.2 mg/dL — ABNORMAL HIGH (ref 0.3–1.2)

## 2013-05-01 MED ORDER — MOMETASONE FURO-FORMOTEROL FUM 200-5 MCG/ACT IN AERO
2.0000 | INHALATION_SPRAY | Freq: Two times a day (BID) | RESPIRATORY_TRACT | Status: DC
  Administered 2013-05-01 – 2013-05-06 (×9): 2 via RESPIRATORY_TRACT
  Filled 2013-05-01: qty 8.8

## 2013-05-01 MED ORDER — HALOPERIDOL LACTATE 5 MG/ML IJ SOLN
2.0000 mg | Freq: Four times a day (QID) | INTRAMUSCULAR | Status: DC | PRN
  Administered 2013-05-02: 2 mg via INTRAVENOUS
  Filled 2013-05-01 (×2): qty 1

## 2013-05-01 MED ORDER — ZOLPIDEM TARTRATE 5 MG PO TABS
5.0000 mg | ORAL_TABLET | Freq: Every day | ORAL | Status: DC
  Administered 2013-05-01 – 2013-05-02 (×2): 5 mg via ORAL
  Filled 2013-05-01 (×2): qty 1

## 2013-05-01 MED ORDER — HALOPERIDOL 2 MG PO TABS
2.0000 mg | ORAL_TABLET | Freq: Two times a day (BID) | ORAL | Status: DC
  Administered 2013-05-01 – 2013-05-02 (×4): 2 mg via ORAL
  Filled 2013-05-01 (×7): qty 1

## 2013-05-01 MED ORDER — HALOPERIDOL LACTATE 5 MG/ML IJ SOLN
2.0000 mg | Freq: Once | INTRAMUSCULAR | Status: AC
  Administered 2013-05-01: 2 mg via INTRAVENOUS
  Filled 2013-05-01: qty 1

## 2013-05-01 MED ORDER — FENTANYL 50 MCG/HR TD PT72
50.0000 ug | MEDICATED_PATCH | TRANSDERMAL | Status: DC
  Administered 2013-05-01 – 2013-05-04 (×2): 50 ug via TRANSDERMAL
  Filled 2013-05-01 (×2): qty 1

## 2013-05-01 MED ORDER — LEVALBUTEROL HCL 1.25 MG/0.5ML IN NEBU
1.2500 mg | INHALATION_SOLUTION | Freq: Four times a day (QID) | RESPIRATORY_TRACT | Status: DC | PRN
  Administered 2013-05-04 – 2013-05-06 (×2): 1.25 mg via RESPIRATORY_TRACT
  Filled 2013-05-01 (×3): qty 0.5

## 2013-05-01 NOTE — Progress Notes (Signed)
Throughout the night, since 7pm pt has been chronically restless and agitated.  He has attempted to get OOB,pulled out one IV,  pulls off all wires at least once per hour, asked "if there was someone living in the wall?"  And other inappropriate/delusional conversation.  Pt will follow commands and will at times knows who, where and what time it is, but remains very confused. Pt has been reoriented, medicated, staff has been at bedside including unit nursing tech, and family has been contacted and asked to be involved in patient care.  Currently, they are unavailable to come in at this time.  Also, bed has been pulled out as close as possible to the hall for better view.  Charge RN and [facility] aware and request has been placed for Recruitment consultant.

## 2013-05-01 NOTE — Progress Notes (Signed)
Mr. Maltese seems to be a little better. There is still some disorientation. I think this is more reflection of his pancytopenia and neutropenia. I think cultures are negative. However, it is possible this could be some element of sepsis. He is on Maxipime and vancomycin.  His white cell count is coming up. It is now 0.5. He is on Neupogen. I'll keep him on Neupogen until the absolute neutrophil count is about 500. He continues on IV antibiotics.  He was transfused yesterday with blood and platelets.  I suspect that this confusion probably would get better once is out of the ICU.   He is afebrile. Temperature is 99.2. Pulse is 110. Blood pressure 141/71. Oral mucosa is slightly dry. There is no mucositis. Lungs are clear. Cardiac exam tachycardic regular. There are no murmurs rubs or bruits. Abdomen is soft. Bowel sounds slightly decreased. There is no palpable hepatosplenomegaly extremities shows no clubbing cyanosis or edema.  Pancytopenia will resolve. His white cell count is coming up. I believe this will be the key for him to improve.  Continue IV antibiotics for now. I do not see need for transfusions.  I very much appreciate the outstanding care that he is receiving down in the ICU!!  Pete E.  Romans 8:28

## 2013-05-01 NOTE — Progress Notes (Signed)
Patient Name: [name] Date of Encounter: 05/01/2013     Active Problems:   DM   HYPERLIPIDEMIA-MIXED   HYPERTENSION, UNSPECIFIED   CAD, NATIVE VESSEL   Small cell carcinoma of lung   Atrial fibrillation with RVR   A-fib   Febrile neutropenia   Pancytopenia due to chemotherapy    SUBJECTIVE  Remaining in sinus tachycardia.  No further atrial fib.  CURRENT MEDS . atorvastatin  40 mg Oral QHS  . ceFEPime (MAXIPIME) IV  2 g Intravenous Q8H  . diltiazem  240 mg Oral Daily  . famotidine  20 mg Oral BID  . fentaNYL  50 mcg Transdermal Q72H  . filgrastim  480 mcg Subcutaneous Daily  . haloperidol  2 mg Oral BID  . insulin aspart  0-15 Units Subcutaneous TID WC  . insulin aspart  0-5 Units Subcutaneous QHS  . insulin detemir  10 Units Subcutaneous Daily  . isosorbide mononitrate  60 mg Oral Daily  . metoprolol tartrate  25 mg Oral BID  . sodium chloride  3 mL Intravenous Q12H  . sucralfate  1 g Oral TID WC & HS  . vancomycin  1,000 mg Intravenous Q12H  . zolpidem  5 mg Oral QHS    OBJECTIVE  Filed Vitals:   05/01/13 0245 05/01/13 0400 05/01/13 0600 05/01/13 0746  BP: 127/102 127/102 119/105 141/71  Pulse:   110   Temp: 99.2 F (37.3 C)     TempSrc:      Resp: 15 18 19 26   Height:      Weight:      SpO2:   97% 96%    Intake/Output Summary (Last 24 hours) at 05/01/13 0916 Last data filed at 05/01/13 0753  Gross per 24 hour  Intake 1368.33 ml  Output    800 ml  Net 568.33 ml   Filed Weights   04/28/13 2015  Weight: 213 lb 10 oz (96.9 kg)    PHYSICAL EXAM  General: Pleasant, NAD.  Complains of painful difficulty with swallowing no improvement. Neuro: Alert and oriented X 3. Moves all extremities spontaneously.  Psych: Discouraged HEENT: Normal  Neck: Supple without bruits or JVD.  Lungs: Resp regular and unlabored, CTA.  Heart: RRR no s3, s4, or murmurs.sinus tachycardia Abdomen: Soft, non-tender, non-distended, BS + x 4.  Extremities: No  clubbing, cyanosis or edema. DP/PT/Radials 2+ and equal bilaterally.   Accessory Clinical Findings  CBC  Recent Labs  04/30/13 0352 05/01/13 0340  WBC 0.2* 0.5*  NEUTROABS 0.0* 0.3*  HGB 6.9* 8.4*  HCT 19.4* 22.9*  MCV 86.6 83.9  PLT 12* 57*   Basic Metabolic Panel  Recent Labs  04/30/13 0352 05/01/13 0340  NA 132* 134*  K 4.1 3.8  CL 96 99  CO2 26 26  GLUCOSE 158* 111*  BUN 22 20  CREATININE 1.39* 1.31  CALCIUM 8.8 9.2   Liver Function Tests  Recent Labs  04/30/13 0352 05/01/13 0340  AST 8 14  ALT 10 15  ALKPHOS 79 83  BILITOT 1.4* 3.2*  PROT 6.2 6.3  ALBUMIN 3.0* 3.0*   No results found for this basename: LIPASE, AMYLASE,  in the last 72 hours Cardiac Enzymes  Recent Labs  04/28/13 1924 04/29/13 0050 04/29/13 1045  TROPONINI <0.30 <0.30 <0.30   BNP No components found with this basename: POCBNP,  D-Dimer No results found for this basename: DDIMER,  in the last 72 hours Hemoglobin A1C  Recent Labs  04/28/13 1921  HGBA1C  8.4*   Fasting Lipid Panel No results found for this basename: CHOL, HDL, LDLCALC, TRIG, CHOLHDL, LDLDIRECT,  in the last 72 hours Thyroid Function Tests  Recent Labs  04/30/13 1136  TSH 0.971  T3FREE 2.9    TELE  Telemetry shows sinus tachycardia  ECG    Radiology/Studies  Ct Chest W Contrast  04/18/2013   CLINICAL DATA:  Small cell lung cancer diagnosed 02/2013, chemotherapy and XRT in progress, chest pain, cough, shortness of breath  EXAM: CT CHEST WITH CONTRAST  TECHNIQUE: Multidetector CT imaging of the chest was performed during intravenous contrast administration.  CONTRAST:  80mL OMNIPAQUE IOHEXOL 300 MG/ML  SOLN  COMPARISON:  PET-CT dated 02/24/2013.  CT chest dated 02/14/2013.  FINDINGS: Radiation changes in the superior segment left lower lobe. Prior left lower lobe pulmonary nodule/mass has essentially resolved.  Underlying moderate to severe centrilobular and paraseptal emphysematous changes.  Postsurgical changes related to prior right lung wedge resection. No pleural effusion or pneumothorax.  Visualized thyroid is unremarkable.  The heart is normal in size. No pericardial effusion. Coronary atherosclerosis. Atherosclerotic calcifications of the aortic arch.  Improving thoracic lymphadenopathy, including:  --9 mm short axis prevascular node (series 2/ image 22), previously 17 mm  --14 mm AP window node (series 2/image 28), previously 26 mm  --10 mm short axis subcarinal node (series 2/ image 30), previously 12 mm  --12 mm short axis left hilar node (series 2/ image 32), previously 25 mm  Visualized upper abdomen is unremarkable.  Degenerative changes of the visualized thoracolumbar spine. Multiple right rib deformities related to prior thoracotomy.  IMPRESSION: Radiation changes in the superior segment left lower lobe. Postsurgical changes related to prior right lung wedge resection.  Prior left lower lobe pulmonary nodule/mass has essentially resolved.  Improving thoracic lymphadenopathy, as described above.   Electronically Signed   By: Charline Bills M.D.   On: 04/18/2013 09:37   Dg Chest Port 1 View  04/29/2013   CLINICAL DATA:  Fever.  EXAM: PORTABLE CHEST - 1 VIEW  COMPARISON:  Chest CT 04/18/2013.  Chest x-ray 02/14/2013.  FINDINGS: Mediastinum and hilar structures are normal. Mild right middle lobe atelectatic changes are present. No pleural effusion or infiltrate. No mass lesion noted on today's examination. Previously identified left hilar mass is not identified. Heart size and pulmonary vascularity normal. No acute osseous abnormality.  IMPRESSION: 1. Previously identified left hilar mass no longer identified. 2. Mild right middle lobe atelectasis.   Electronically Signed   By: Maisie Fus  Register   On: 04/29/2013 07:18    ASSESSMENT AND PLAN 1. Atrial fibrillation with rapid ventricular response: Pt has converted to sinus. On oral cardizem. Probably a good idea to stay on oral  diltiazem long term if he tolerates. His CHADS-Vasc score is 5 (CAD, HTN, hx Stroke 2, and diabetes). In the absence of contraindications, would recommend anticoagulation, however, with ongoing chemotherapy and severe pancytopenia, would not recommend aspirin or oral anticoagulation at this time. Echo shows normal systolic function.  2. Coronary artery disease, native vessel: Continue observation. No evidence of anginal symptoms. His constant chest pain is likely related to the effects of radiation therapy. Troponin levels are normal.  3. Hypertension: Continue cardizem.  4. Anemia: Per primary. Anemia contributing to his tachycardia.  No new recommendations  Signed, Cassell Clement  MD

## 2013-05-01 NOTE — Progress Notes (Signed)
[facility] PROGRESS NOTE  [name] WJX:[id number] DOB: [birth year] DOA: 04/28/2013 PCP: Clydell Hakim, MD  Assessment/Plan  A-fib with RVR, resolved, currently sinus tachycardia -  Appreciate cardiology assistance -  Troponins negative -  Continue diltiazem and metoprolol -  Followup echocardiogram:  Mild LVH, and suture fraction 65%, could not exclude regional wall motion abnormalities, aortic and mitral valves appeared grossly normal, the right ventricle was poorly visualized. -  May also have component of anxiety:  Increase ativan -  TSH  ICU delirium, pulling out PIV, intermittently confused, not sleeping -  Oriented to person, place, and wrong day of week this morning -  Last MRI brain was 11/3 and was negative for metastatic disease -  Will discuss repeat imaging with oncology -  Start bid haldol -  Start ambien  Febrile neutropenia with fever to 101 Fahrenheit, intermittently febrile -  Blood cultures x2, no growth to date -  Urinalysis neg -  Chest x-ray unremarkable -  Continue cefepime and vancomycin  -  Appreciate hematology and oncology recommendations -  Anticipate transitioning to oral medication like levofloxacin in few days the fevers resolve  Pancytopenia due to chemotherapy, received Neulasta at cancer Center, no schistocytes on smear to suggest DIC, hgb and plt improved -  Receiving Neupogen -  Transfused 1 unit PRBCs 12/19 -  Transfuse 2 units PRBCs and 2 packs of platelets 12/20 -  Continue to hold aspirin, Plavix, and Lovenox due to low platelets and high risk of bleeding  Type 2 diabetes mellitus, CBG grossly within range - CBG stable -  Hold glipizide -  Continue sliding scale insulin  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  Troponins negative, ECG remained stable.  -  Cardiology following -  Continue famotidine and Carafate -  Patient states that Magic mouthwash has not helped in the past -  Increase fentanyl patch to -   Continue hydrocodone when necessary  Hypertension, BP low normal -  Continue Cardizem and metoprolol  Coronary artery disease with chest pain which is chronic. Chest pain is likely not due to ACS -  EKG without ischemic changes -  Troponins negative   Diet:  Diabetic  Access:  PIV  IVF:  Yes  Proph:  SCD  Code Status: Full  Family Communication: Spoke to patient alone  Disposition Plan:  Pending medically stable.     Consultants:  Cardiology, Doctor Excell Seltzer  Oncology  Procedures:  Chest x-ray  Blood transfusion  Antibiotics:  Cefepime 12/19   HPI/Subjective:  Patient continues to have chest pain with swallowing which is chronic and stable.  Mid-thoracic back area less itchy.  Coughing some.  Agitated and confused per nursing staff overnight.       Objective: Filed Vitals:   05/01/13 0230 05/01/13 0245 05/01/13 0400 05/01/13 0600  BP:  127/102 127/102 119/105  Pulse: 100   110  Temp: 99.2 F (37.3 C) 99.2 F (37.3 C)    TempSrc: Axillary     Resp: 24 15 18 19   Height:      Weight:      SpO2:    97%    Intake/Output Summary (Last 24 hours) at 05/01/13 0739 Last data filed at 05/01/13 0245  Gross per 24 hour  Intake 1168.33 ml  Output      0 ml  Net 1168.33 ml   Filed Weights   04/28/13 2015  Weight: 96.9 kg (213 lb 10 oz)    Exam:   General:  overweight Caucasian male, No acute distress, Alopecia   HEENT:  NCAT, MMM, no thrush  Cardiovascular:   Tachycardic, RR, nl S1, S2 no mrg, 2+ pulses, warm extremities  Respiratory:  CTAB, no increased WOB  Abdomen:   NABS, soft, NT/ND  MSK:   Normal tone and bulk, no LEE  Neuro:  Grossly intact  Skin:  Hand-sized area mid-back with hyperpigmentation, but also some light erythema and excoriations with skin breakdown, stable from yesterday  Psych:  Anxious appearing, oriented to person, place, and wrong day of week  Data Reviewed: Basic Metabolic Panel:  Recent Labs Lab 04/27/13 0859  04/28/13 1430 04/28/13 1921 04/29/13 0415 04/30/13 0352 05/01/13 0340  NA 134* 133*  --  130* 132* 134*  K 4.7 4.1  --  4.1 4.1 3.8  CL  --  96  --  95* 96 99  CO2 28 25  --  26 26 26   GLUCOSE 212* 171*  --  216* 158* 111*  BUN 23.3 23  --  23 22 20   CREATININE 1.4* 1.35 1.32 1.38* 1.39* 1.31  CALCIUM 9.6 9.1  --  8.7 8.8 9.2   Liver Function Tests:  Recent Labs Lab 04/27/13 0859 04/30/13 0352 05/01/13 0340  AST 16 8 14   ALT 15 10 15   ALKPHOS 95 79 83  BILITOT 1.40* 1.4* 3.2*  PROT 6.4 6.2 6.3  ALBUMIN 3.3* 3.0* 3.0*   No results found for this basename: LIPASE, AMYLASE,  in the last 168 hours No results found for this basename: AMMONIA,  in the last 168 hours CBC:  Recent Labs Lab 04/27/13 0859  04/28/13 1430 04/28/13 1921 04/29/13 0415 04/29/13 1045 04/30/13 0352 05/01/13 0340  WBC 0.5*  < > 0.2* 0.2* 0.2* 0.1* 0.2* 0.5*  NEUTROABS 0.3*  --  0.1*  --   --   --  0.0* 0.3*  HGB 7.3*  < > 7.6* 7.5* 6.7* 7.3* 6.9* 8.4*  HCT 21.1*  < > 21.3* 20.5* 19.0* 19.9* 19.4* 22.9*  MCV 89.4  < > 86.9 88.0 88.4 86.1 86.6 83.9  PLT 69*  < > 41* 36* 27* 25* 12* 57*  < > = values in this interval not displayed. Cardiac Enzymes:  Recent Labs Lab 04/28/13 1430 04/28/13 1924 04/29/13 0050 04/29/13 1045  TROPONINI <0.30 <0.30 <0.30 <0.30   BNP (last 3 results)  Recent Labs  02/14/13 1953 04/28/13 1430  PROBNP 290.3* 678.8*   CBG:  Recent Labs Lab 04/29/13 2140 04/30/13 0753 04/30/13 1302 04/30/13 1618 04/30/13 2212  GLUCAP 100* 167* 152* 156* 145*    Recent Results (from the past 240 hour(s))  MRSA PCR SCREENING     Status: None   Collection Time    04/28/13  6:55 PM      Result Value Range Status   MRSA by PCR NEGATIVE  NEGATIVE Final   Comment:            The GeneXpert MRSA Assay (FDA     approved for NASAL specimens     only), is one component of a     comprehensive MRSA colonization     surveillance program. It is not     intended to diagnose  MRSA     infection nor to guide or     monitor treatment for     MRSA infections.  CULTURE, BLOOD (ROUTINE X 2)     Status: None   Collection Time    04/29/13 12:50 AM  Result Value Range Status   Specimen Description BLOOD RIGHT ARM   Final   Special Requests BOTTLES DRAWN AEROBIC AND ANAEROBIC 5CC   Final   Culture  Setup Time     Final   Value: 04/29/2013 08:55     Performed at [facility]   Culture     Final   Value:        BLOOD CULTURE RECEIVED NO GROWTH TO DATE CULTURE WILL BE HELD FOR 5 DAYS BEFORE ISSUING A FINAL NEGATIVE REPORT     Performed at [facility]   Report Status PENDING   Incomplete  CULTURE, BLOOD (ROUTINE X 2)     Status: None   Collection Time    04/29/13 12:55 AM      Result Value Range Status   Specimen Description BLOOD LEFT ARM   Final   Special Requests BOTTLES DRAWN AEROBIC AND ANAEROBIC 5CC   Final   Culture  Setup Time     Final   Value: 04/29/2013 08:55     Performed at [facility]   Culture     Final   Value:        BLOOD CULTURE RECEIVED NO GROWTH TO DATE CULTURE WILL BE HELD FOR 5 DAYS BEFORE ISSUING A FINAL NEGATIVE REPORT     Performed at [facility]   Report Status PENDING   Incomplete     Studies: No results found.  Scheduled Meds: . atorvastatin  40 mg Oral QHS  . ceFEPime (MAXIPIME) IV  2 g Intravenous Q8H  . diltiazem  240 mg Oral Daily  . famotidine  20 mg Oral BID  . fentaNYL  25 mcg Transdermal Q72H  . filgrastim  480 mcg Subcutaneous Daily  . insulin aspart  0-15 Units Subcutaneous TID WC  . insulin aspart  0-5 Units Subcutaneous QHS  . insulin detemir  10 Units Subcutaneous Daily  . isosorbide mononitrate  60 mg Oral Daily  . metoprolol tartrate  25 mg Oral BID  . sodium chloride  3 mL Intravenous Q12H  . sucralfate  1 g Oral TID WC & HS  . vancomycin  1,000 mg Intravenous Q12H   Continuous Infusions:   Active Problems:   DM   HYPERLIPIDEMIA-MIXED   HYPERTENSION,  UNSPECIFIED   CAD, NATIVE VESSEL   Small cell carcinoma of lung   Atrial fibrillation with RVR   A-fib   Febrile neutropenia   Pancytopenia due to chemotherapy    Time spent: 30 min    Omarrion Carmer, [facility]  [facility] Pager [phone]. If 7PM-7AM, please contact night-coverage at [email or web address], password [facility] 05/01/2013, 7:39 AM  LOS: 3 days

## 2013-05-02 ENCOUNTER — Inpatient Hospital Stay (HOSPITAL_COMMUNITY): Payer: BC Managed Care – PPO

## 2013-05-02 ENCOUNTER — Encounter (HOSPITAL_COMMUNITY): Payer: Self-pay | Admitting: Radiology

## 2013-05-02 DIAGNOSIS — E43 Unspecified severe protein-calorie malnutrition: Secondary | ICD-10-CM | POA: Insufficient documentation

## 2013-05-02 DIAGNOSIS — F05 Delirium due to known physiological condition: Secondary | ICD-10-CM

## 2013-05-02 LAB — GLUCOSE, CAPILLARY
Glucose-Capillary: 191 mg/dL — ABNORMAL HIGH (ref 70–99)
Glucose-Capillary: 116 mg/dL — ABNORMAL HIGH (ref 70–99)

## 2013-05-02 LAB — PREPARE PLATELET PHERESIS: Unit division: 0

## 2013-05-02 LAB — COMPREHENSIVE METABOLIC PANEL
AST: 12 U/L (ref 0–37)
Albumin: 2.9 g/dL — ABNORMAL LOW (ref 3.5–5.2)
Alkaline Phosphatase: 88 U/L (ref 39–117)
Chloride: 97 mEq/L (ref 96–112)
Creatinine, Ser: 1.2 mg/dL (ref 0.50–1.35)
Potassium: 3.3 mEq/L — ABNORMAL LOW (ref 3.5–5.1)
Sodium: 135 mEq/L (ref 135–145)
Total Bilirubin: 1 mg/dL (ref 0.3–1.2)
Total Protein: 6.2 g/dL (ref 6.0–8.3)

## 2013-05-02 LAB — CBC WITH DIFFERENTIAL/PLATELET
Eosinophils Absolute: 0 10*3/uL (ref 0.0–0.7)
Eosinophils Relative: 2 % (ref 0–5)
HCT: 23.5 % — ABNORMAL LOW (ref 39.0–52.0)
Lymphs Abs: 0.2 10*3/uL — ABNORMAL LOW (ref 0.7–4.0)
MCH: 30.2 pg (ref 26.0–34.0)
MCV: 84.5 fL (ref 78.0–100.0)
Monocytes Absolute: 0.6 10*3/uL (ref 0.1–1.0)
Monocytes Relative: 24 % — ABNORMAL HIGH (ref 3–12)
Neutro Abs: 1.5 10*3/uL — ABNORMAL LOW (ref 1.7–7.7)
Platelets: 36 10*3/uL — ABNORMAL LOW (ref 150–400)
RBC: 2.78 MIL/uL — ABNORMAL LOW (ref 4.22–5.81)
RDW: 14.8 % (ref 11.5–15.5)

## 2013-05-02 MED ORDER — ENSURE COMPLETE PO LIQD
237.0000 mL | Freq: Three times a day (TID) | ORAL | Status: DC
  Administered 2013-05-02 – 2013-05-06 (×8): 237 mL via ORAL

## 2013-05-02 MED ORDER — HALOPERIDOL LACTATE 5 MG/ML IJ SOLN
2.0000 mg | Freq: Once | INTRAMUSCULAR | Status: AC
  Administered 2013-05-02: 2 mg via INTRAVENOUS

## 2013-05-02 MED ORDER — IOHEXOL 350 MG/ML SOLN
100.0000 mL | Freq: Once | INTRAVENOUS | Status: AC | PRN
  Administered 2013-05-02: 100 mL via INTRAVENOUS

## 2013-05-02 MED ORDER — POTASSIUM CHLORIDE CRYS ER 20 MEQ PO TBCR
40.0000 meq | EXTENDED_RELEASE_TABLET | Freq: Once | ORAL | Status: AC
  Administered 2013-05-02: 40 meq via ORAL
  Filled 2013-05-02: qty 2

## 2013-05-02 NOTE — Evaluation (Signed)
Physical Therapy Evaluation Patient Details Name: [name] MRN: [id number] DOB: 07-29-1955 Today's Date: 05/02/2013 Time: 4098-1191 PT Time Calculation (min): 16 min  PT Assessment / Plan / Recommendation History of Present Illness  57 yo male admitted with A fib with RVR, delirium, pancytopenia. Hx of lung cancer, undergoing chemo, DM, HTN, CAD  Clinical Impression  On eval, pt required Min guard to Min assist for mobility-able to ambulate ~175 feet without assistive device. Noted pt to have intermittent staggering/stumbles with changes in direction/head turns while ambulating. Discussed use of DME (RW vs cane) when ambulating and energy conservation tasks. VCs for pt to slow pace and for pursed lip breathing when ambulating. Recommend HHPT for balance training and 24 hour supervision/assist initially for safety at home.     PT Assessment  Patient needs continued PT services    Follow Up Recommendations  Home health PT;Supervision/Assistance - 24 hour (for balance training)    Does the patient have the potential to tolerate intense rehabilitation      Barriers to Discharge        Equipment Recommendations  None recommended by PT    Recommendations for Other Services OT consult   Frequency Min 3X/week    Precautions / Restrictions Precautions Precautions: Fall Restrictions Weight Bearing Restrictions: No   Pertinent Vitals/Pain Chest/throat area-unrated     Mobility  Bed Mobility Bed Mobility: Not assessed Details for Bed Mobility Assistance: pt sitting in recliner Transfers Transfers: Sit to Stand;Stand to Sit Sit to Stand: 5: Supervision;From chair/3-in-1 Stand to Sit: 5: Supervision;To chair/3-in-1 Details for Transfer Assistance: vcs safety Ambulation/Gait Ambulation/Gait Assistance: 4: Min guard;4: Min assist Ambulation Distance (Feet): 175 Feet Assistive device: None Ambulation/Gait Assistance Details: Intermittent staggering/stumbles during ambulation,  especially with head turns/changes in direction. VCs for pacing and seated rest breaks when needed. 1 seated break after ~80 feet Gait Pattern: Step-through pattern    Exercises     PT Diagnosis: Difficulty walking;Altered mental status  PT Problem List: Decreased balance;Decreased mobility;Decreased activity tolerance;Decreased cognition PT Treatment Interventions: Gait training;DME instruction;Functional mobility training;Therapeutic activities;Therapeutic exercise;Balance training;Patient/family education     PT Goals(Current goals can be found in the care plan section) Acute Rehab PT Goals Patient Stated Goal: home.  PT Goal Formulation: With patient Time For Goal Achievement: 05/16/13 Potential to Achieve Goals: Good  Visit Information  Last PT Received On: 05/02/13 Assistance Needed: +1 History of Present Illness: 57 yo male admitted with A fib with RVR, delirium, pancytopenia. Hx of lung cancer, undergoing chemo, DM, HTN, CAD       Prior Functioning  Home Living Family/patient expects to be discharged to:: Private residence Living Arrangements: Alone Available Help at Discharge: Family;Available PRN/intermittently Type of Home: House Home Access: Stairs to enter Entrance Stairs-Number of Steps: 1 Home Layout: One level Home Equipment: Walker - 2 wheels;Cane - single point Prior Function Level of Independence: Independent Comments: pt states he has DME but does not use Communication Communication: No difficulties    Cognition  Cognition Arousal/Alertness: Awake/alert Behavior During Therapy: WFL for tasks assessed/performed Overall Cognitive Status: Impaired/Different from baseline Area of Impairment: Safety/judgement Safety/Judgement: Decreased awareness of safety;Decreased awareness of deficits    Extremity/Trunk Assessment Upper Extremity Assessment Upper Extremity Assessment: Defer to OT evaluation Lower Extremity Assessment Lower Extremity Assessment:  Generalized weakness Cervical / Trunk Assessment Cervical / Trunk Assessment: Normal   Balance Balance Balance Assessed: Yes Static Standing Balance Static Standing - Balance Support: No upper extremity supported Static Standing - Level of Assistance:  5: Stand by assistance Dynamic Standing Balance Dynamic Standing - Balance Support: No upper extremity supported Dynamic Standing - Level of Assistance: 4: Min assist;5: Stand by assistance High Level Balance High Level Balance Activites: Direction changes;Turns;Head turns High Level Balance Comments: Noted staggering/stumbes, near LOB with these tasks  End of Session PT - End of Session Equipment Utilized During Treatment: Gait belt Activity Tolerance: Patient tolerated treatment well Patient left: in chair;with call bell/phone within reach;with nursing/sitter in room Nurse Communication: Mobility status  GP     Rebeca Alert, MPT Pager: [phone]

## 2013-05-02 NOTE — Progress Notes (Signed)
Patient Name: [name] Date of Encounter: 05/02/2013     Active Problems:   DM   HYPERLIPIDEMIA-MIXED   HYPERTENSION, UNSPECIFIED   CAD, NATIVE VESSEL   Small cell carcinoma of lung   Atrial fibrillation with RVR   A-fib   Febrile neutropenia   Pancytopenia due to chemotherapy    SUBJECTIVE  Remaining in sinus tachycardia.  No further atrial fib.  CURRENT MEDS . atorvastatin  40 mg Oral QHS  . diltiazem  240 mg Oral Daily  . famotidine  20 mg Oral BID  . fentaNYL  50 mcg Transdermal Q72H  . haloperidol  2 mg Oral BID  . insulin aspart  0-15 Units Subcutaneous TID WC  . insulin aspart  0-5 Units Subcutaneous QHS  . insulin detemir  10 Units Subcutaneous Daily  . isosorbide mononitrate  60 mg Oral Daily  . metoprolol tartrate  25 mg Oral BID  . mometasone-formoterol  2 puff Inhalation BID  . potassium chloride  40 mEq Oral Once  . sodium chloride  3 mL Intravenous Q12H  . sucralfate  1 g Oral TID WC & HS  . zolpidem  5 mg Oral QHS    OBJECTIVE  Filed Vitals:   05/01/13 2300 05/02/13 0200 05/02/13 0330 05/02/13 0400  BP: 140/85 115/93  110/68  Pulse: 112 98 102 108  Temp:    97.7 F (36.5 C)  TempSrc:    Oral  Resp: 22 19 19 22   Height:      Weight:    212 lb 1.3 oz (96.2 kg)  SpO2: 96% 98% 92% 96%    Intake/Output Summary (Last 24 hours) at 05/02/13 0649 Last data filed at 05/02/13 0500  Gross per 24 hour  Intake    500 ml  Output   1825 ml  Net  -1325 ml   Filed Weights   04/28/13 2015 05/02/13 0400  Weight: 213 lb 10 oz (96.9 kg) 212 lb 1.3 oz (96.2 kg)    PHYSICAL EXAM  General: Pleasant, NAD.  Complains of painful difficulty with swallowing no improvement. Neuro: Alert and oriented X 3. Moves all extremities spontaneously.  Psych: Discouraged HEENT: Normal  Neck: Supple without bruits or JVD.  Lungs: Resp regular and unlabored, CTA.  Has a radiation burn on his back  Heart: RR no s3, s4, or murmurs.sinus tachycardia Abdomen: Soft,  non-tender, non-distended, BS + x 4.  Extremities: No clubbing, cyanosis or edema. DP/PT/Radials 2+ and equal bilaterally.   Accessory Clinical Findings  CBC  Recent Labs  05/01/13 0340 05/02/13 0350  WBC 0.5* 2.3*  NEUTROABS 0.3* 1.5*  HGB 8.4* 8.4*  HCT 22.9* 23.5*  MCV 83.9 84.5  PLT 57* 36*   Basic Metabolic Panel  Recent Labs  05/01/13 0340 05/02/13 0350  NA 134* 135  K 3.8 3.3*  CL 99 97  CO2 26 26  GLUCOSE 111* 176*  BUN 20 13  CREATININE 1.31 1.20  CALCIUM 9.2 9.0   Liver Function Tests  Recent Labs  05/01/13 0340 05/02/13 0350  AST 14 12  ALT 15 14  ALKPHOS 83 88  BILITOT 3.2* 1.0  PROT 6.3 6.2  ALBUMIN 3.0* 2.9*   No results found for this basename: LIPASE, AMYLASE,  in the last 72 hours Cardiac Enzymes  Recent Labs  04/29/13 1045  TROPONINI <0.30   BNP No components found with this basename: POCBNP,  D-Dimer No results found for this basename: DDIMER,  in the last 72 hours Hemoglobin  A1C No results found for this basename: HGBA1C,  in the last 72 hours Fasting Lipid Panel No results found for this basename: CHOL, HDL, LDLCALC, TRIG, CHOLHDL, LDLDIRECT,  in the last 72 hours Thyroid Function Tests  Recent Labs  04/30/13 1136  TSH 0.971  T3FREE 2.9    TELE  Telemetry shows sinus tachycardia  ECG    Radiology/Studies  Ct Chest W Contrast  04/18/2013   CLINICAL DATA:  Small cell lung cancer diagnosed 02/2013, chemotherapy and XRT in progress, chest pain, cough, shortness of breath  EXAM: CT CHEST WITH CONTRAST  TECHNIQUE: Multidetector CT imaging of the chest was performed during intravenous contrast administration.  CONTRAST:  80mL OMNIPAQUE IOHEXOL 300 MG/ML  SOLN  COMPARISON:  PET-CT dated 02/24/2013.  CT chest dated 02/14/2013.  FINDINGS: Radiation changes in the superior segment left lower lobe. Prior left lower lobe pulmonary nodule/mass has essentially resolved.  Underlying moderate to severe centrilobular and paraseptal  emphysematous changes. Postsurgical changes related to prior right lung wedge resection. No pleural effusion or pneumothorax.  Visualized thyroid is unremarkable.  The heart is normal in size. No pericardial effusion. Coronary atherosclerosis. Atherosclerotic calcifications of the aortic arch.  Improving thoracic lymphadenopathy, including:  --9 mm short axis prevascular node (series 2/ image 22), previously 17 mm  --14 mm AP window node (series 2/image 28), previously 26 mm  --10 mm short axis subcarinal node (series 2/ image 30), previously 12 mm  --12 mm short axis left hilar node (series 2/ image 32), previously 25 mm  Visualized upper abdomen is unremarkable.  Degenerative changes of the visualized thoracolumbar spine. Multiple right rib deformities related to prior thoracotomy.  IMPRESSION: Radiation changes in the superior segment left lower lobe. Postsurgical changes related to prior right lung wedge resection.  Prior left lower lobe pulmonary nodule/mass has essentially resolved.  Improving thoracic lymphadenopathy, as described above.   Electronically Signed   By: Charline Bills M.D.   On: 04/18/2013 09:37   Dg Chest Port 1 View  04/29/2013   CLINICAL DATA:  Fever.  EXAM: PORTABLE CHEST - 1 VIEW  COMPARISON:  Chest CT 04/18/2013.  Chest x-ray 02/14/2013.  FINDINGS: Mediastinum and hilar structures are normal. Mild right middle lobe atelectatic changes are present. No pleural effusion or infiltrate. No mass lesion noted on today's examination. Previously identified left hilar mass is not identified. Heart size and pulmonary vascularity normal. No acute osseous abnormality.  IMPRESSION: 1. Previously identified left hilar mass no longer identified. 2. Mild right middle lobe atelectasis.   Electronically Signed   By: Maisie Fus  Register   On: 04/29/2013 07:18    ASSESSMENT AND PLAN 1. Atrial fibrillation with rapid ventricular response: Pt has converted to sinus. On oral cardizem. Probably a good idea  to stay on oral diltiazem long term if he tolerates. His CHADS-Vasc score is 5 (CAD, HTN, hx Stroke 2, and diabetes). In the absence of contraindications, would recommend anticoagulation, however, with ongoing chemotherapy and severe pancytopenia, would not recommend aspirin or oral anticoagulation at this time. Echo shows normal systolic function.   He remains in sinus tach.  Can increase dilt or add low dose of Metoprolol as needed to keep HR < 100.  This sinus tach is secondary to his underlying issues   Will sign off.  Call for question.   2. Coronary artery disease, native vessel: Continue observation. No evidence of anginal symptoms. His constant chest pain is likely related to the effects of radiation therapy. Troponin levels are  normal.   3. Hypertension: Continue cardizem.   4. Anemia: Per primary. Anemia contributing to his tachycardia.  No new recommendations  Signed, Elyn Aquas.  MD

## 2013-05-02 NOTE — Progress Notes (Signed)
[facility] PROGRESS NOTE  [name] RUE:[id number] DOB: 07/25/1955 DOA: 04/28/2013 PCP: Clydell Hakim, MD  Assessment/Plan  A-fib with RVR, resolved, currently sinus tachycardia -  Appreciate cardiology assistance -  Troponins negative -  Continue diltiazem and metoprolol -  Followup echocardiogram:  Mild LVH, and suture fraction 65%, could not exclude regional wall motion abnormalities, aortic and mitral valves appeared grossly normal, the right ventricle was poorly visualized. -  May also have component of anxiety:  Increase ativan -  TSH 0.971 wnl -  CT angio chest to rule out PE  ICU delirium, pulling out PIV, intermittently confused, not sleeping -  Oriented to person only this morning -  Last MRI brain was 11/3 and was negative for metastatic disease -  CT head neg for hemorrhage -  Continue bid haldol -  Continue ambien -  Transfer from ICU  Febrile neutropenia with fever to 101 Fahrenheit, fevers resolved.  Neutropenia resolved.   -  Blood cultures x2, no growth to date -  Urinalysis neg -  Chest x-ray unremarkable -  D/c cefepime and vancomycin  -  Appreciate hematology and oncology recommendations   Pancytopenia due to chemotherapy, received Neulasta at cancer Center, no schistocytes on smear to suggest DIC, hgb stable, plt drifting down -  Receiving Neupogen -  Transfused 1 unit PRBCs 12/19 -  Transfuse 2 units PRBCs and 2 packs of platelets 12/20 -  Continue to hold aspirin, Plavix, and Lovenox due to low platelets and high risk of bleeding  Type 2 diabetes mellitus, CBG somewhat labile, but generally in the 100s range -  Hold glipizide -  Continue sliding scale insulin  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  Troponins negative, ECG remained stable.  -  Cardiology following -  Continue famotidine and Carafate -  Patient states that Magic mouthwash has not helped in the past -  fentanyl patch increased to on 12/21 -  Continue  hydrocodone when necessary -   Full liquids per speech -  Nutrition consultation to ensure adequate nutrition on liquids diet  Hypertension, BP low normal -  Continue Cardizem and metoprolol  Coronary artery disease with chest pain which is chronic. Chest pain is likely not due to ACS -  EKG without ischemic changes -  Troponins negative   Diet:  Diabetic  Access:  PIV  IVF:  Yes  Proph:  SCD  Code Status: Full  Family Communication: Spoke to patient alone  Disposition Plan:  Pending medically stable, possibly tomorrow.  Transfer to med-surg today   Consultants:  Cardiology, Doctor Excell Seltzer  Oncology  Procedures:  Chest x-ray  Blood transfusion  Antibiotics:  Cefepime 12/19   HPI/Subjective:  Patient continues to have chest pain with swallowing which is chronic and stable.  Mid-thoracic back area less itchy.  Coughing some.  Persistently confused       Objective: Filed Vitals:   05/02/13 0330 05/02/13 0400 05/02/13 0800 05/02/13 0900  BP:  110/68 117/71   Pulse: 102 108 108 109  Temp:  97.7 F (36.5 C) 98.3 F (36.8 C)   TempSrc:  Oral Oral   Resp: 19 22 17 13   Height:      Weight:  96.2 kg (212 lb 1.3 oz)    SpO2: 92% 96% 95% 97%    Intake/Output Summary (Last 24 hours) at 05/02/13 0951 Last data filed at 05/02/13 0900  Gross per 24 hour  Intake    700 ml  Output   1750  ml  Net  -1050 ml   Filed Weights   04/28/13 2015 05/02/13 0400  Weight: 96.9 kg (213 lb 10 oz) 96.2 kg (212 lb 1.3 oz)    Exam:   General:   overweight Caucasian male, No acute distress, Alopecia   HEENT:  NCAT, MMM, no thrush  Cardiovascular:   Tachycardic, RR, nl S1, S2 no mrg, 2+ pulses, warm extremities  Respiratory:  CTAB, no increased WOB  Abdomen:   NABS, soft, NT/ND  MSK:   Normal tone and bulk, no LEE  Neuro:  Grossly intact  Skin:  Hand-sized area mid-back with hyperpigmentation, but also some light erythema and excoriations with skin breakdown, less  erythematous compared to yesterday  Psych:  Anxious appearing, oriented to person only today  Data Reviewed: Basic Metabolic Panel:  Recent Labs Lab 04/28/13 1430 04/28/13 1921 04/29/13 0415 04/30/13 0352 05/01/13 0340 05/02/13 0350  NA 133*  --  130* 132* 134* 135  K 4.1  --  4.1 4.1 3.8 3.3*  CL 96  --  95* 96 99 97  CO2 25  --  26 26 26 26   GLUCOSE 171*  --  216* 158* 111* 176*  BUN 23  --  23 22 20 13   CREATININE 1.35 1.32 1.38* 1.39* 1.31 1.20  CALCIUM 9.1  --  8.7 8.8 9.2 9.0   Liver Function Tests:  Recent Labs Lab 04/27/13 0859 04/30/13 0352 05/01/13 0340 05/02/13 0350  AST 16 8 14 12   ALT 15 10 15 14   ALKPHOS 95 79 83 88  BILITOT 1.40* 1.4* 3.2* 1.0  PROT 6.4 6.2 6.3 6.2  ALBUMIN 3.3* 3.0* 3.0* 2.9*   No results found for this basename: LIPASE, AMYLASE,  in the last 168 hours No results found for this basename: AMMONIA,  in the last 168 hours CBC:  Recent Labs Lab 04/27/13 0859 04/28/13 1430  04/29/13 0415 04/29/13 1045 04/30/13 0352 05/01/13 0340 05/02/13 0350  WBC 0.5* 0.2*  < > 0.2* 0.1* 0.2* 0.5* 2.3*  NEUTROABS 0.3* 0.1*  --   --   --  0.0* 0.3* 1.5*  HGB 7.3* 7.6*  < > 6.7* 7.3* 6.9* 8.4* 8.4*  HCT 21.1* 21.3*  < > 19.0* 19.9* 19.4* 22.9* 23.5*  MCV 89.4 86.9  < > 88.4 86.1 86.6 83.9 84.5  PLT 69* 41*  < > 27* 25* 12* 57* 36*  < > = values in this interval not displayed. Cardiac Enzymes:  Recent Labs Lab 04/28/13 1430 04/28/13 1924 04/29/13 0050 04/29/13 1045  TROPONINI <0.30 <0.30 <0.30 <0.30   BNP (last 3 results)  Recent Labs  02/14/13 1953 04/28/13 1430  PROBNP 290.3* 678.8*   CBG:  Recent Labs Lab 05/01/13 0813 05/01/13 1147 05/01/13 1702 05/01/13 2222 05/02/13 0829  GLUCAP 157* 95 179* 159* 191*    Recent Results (from the past 240 hour(s))  MRSA PCR SCREENING     Status: None   Collection Time    04/28/13  6:55 PM      Result Value Range Status   MRSA by PCR NEGATIVE  NEGATIVE Final   Comment:             The GeneXpert MRSA Assay (FDA     approved for NASAL specimens     only), is one component of a     comprehensive MRSA colonization     surveillance program. It is not     intended to diagnose MRSA     infection nor to  guide or     monitor treatment for     MRSA infections.  CULTURE, BLOOD (ROUTINE X 2)     Status: None   Collection Time    04/29/13 12:50 AM      Result Value Range Status   Specimen Description BLOOD RIGHT ARM   Final   Special Requests BOTTLES DRAWN AEROBIC AND ANAEROBIC 5CC   Final   Culture  Setup Time     Final   Value: 04/29/2013 08:55     Performed at [facility]   Culture     Final   Value:        BLOOD CULTURE RECEIVED NO GROWTH TO DATE CULTURE WILL BE HELD FOR 5 DAYS BEFORE ISSUING A FINAL NEGATIVE REPORT     Performed at [facility]   Report Status PENDING   Incomplete  CULTURE, BLOOD (ROUTINE X 2)     Status: None   Collection Time    04/29/13 12:55 AM      Result Value Range Status   Specimen Description BLOOD LEFT ARM   Final   Special Requests BOTTLES DRAWN AEROBIC AND ANAEROBIC 5CC   Final   Culture  Setup Time     Final   Value: 04/29/2013 08:55     Performed at [facility]   Culture     Final   Value:        BLOOD CULTURE RECEIVED NO GROWTH TO DATE CULTURE WILL BE HELD FOR 5 DAYS BEFORE ISSUING A FINAL NEGATIVE REPORT     Performed at [facility]   Report Status PENDING   Incomplete     Studies: Ct Head Wo Contrast  05/01/2013   CLINICAL DATA:  Increased confusion/delirium  EXAM: CT HEAD WITHOUT CONTRAST  TECHNIQUE: Contiguous axial images were obtained from the base of the skull through the vertex without intravenous contrast.  COMPARISON:  MRI brain dated 03/14/2013  FINDINGS: No evidence of parenchymal hemorrhage or extra-axial fluid collection. No mass lesion, mass effect, or midline shift.  No CT evidence of acute infarction.  Intracranial atherosclerosis.  Cerebral volume is within normal limits.   No ventriculomegaly.  The visualized paranasal sinuses are essentially clear. The mastoid air cells are unopacified.  No evidence of calvarial fracture.  IMPRESSION: No evidence of acute intracranial abnormality.   Electronically Signed   By: Charline Bills M.D.   On: 05/01/2013 10:48    Scheduled Meds: . atorvastatin  40 mg Oral QHS  . diltiazem  240 mg Oral Daily  . famotidine  20 mg Oral BID  . fentaNYL  50 mcg Transdermal Q72H  . haloperidol  2 mg Oral BID  . insulin aspart  0-15 Units Subcutaneous TID WC  . insulin aspart  0-5 Units Subcutaneous QHS  . insulin detemir  10 Units Subcutaneous Daily  . isosorbide mononitrate  60 mg Oral Daily  . metoprolol tartrate  25 mg Oral BID  . mometasone-formoterol  2 puff Inhalation BID  . sodium chloride  3 mL Intravenous Q12H  . sucralfate  1 g Oral TID WC & HS  . zolpidem  5 mg Oral QHS   Continuous Infusions:   Active Problems:   DM   HYPERLIPIDEMIA-MIXED   HYPERTENSION, UNSPECIFIED   CAD, NATIVE VESSEL   Small cell carcinoma of lung   Atrial fibrillation with RVR   A-fib   Febrile neutropenia   Pancytopenia due to chemotherapy    Time spent: 30 min  Renae Fickle  [facility] Pager [phone]. If 7PM-7AM, please contact night-coverage at [email or web address], password [facility] 05/02/2013, 9:51 AM  LOS: 4 days

## 2013-05-02 NOTE — Progress Notes (Signed)
INITIAL NUTRITION ASSESSMENT  Pt meets criteria for severe MALNUTRITION in the context of chronic illness as evidenced by <75% estimated energy intake with 7.8% weight loss in the past month.  DOCUMENTATION CODES Per approved criteria  -Severe malnutrition in the context of chronic illness -Obesity Unspecified   INTERVENTION: - Ensure Complete QID - Recommend MD consider adding magic mouthwash to help with painful swallowing  - Will continue to monitor   NUTRITION DIAGNOSIS: Inadequate oral intake related to confusion, poor appetite, painful swallowing as evidenced by 25% meal intake.   Goal: 1. Resolution of painful swallowing 2. Pt to consume >90% of meals/supplements  Monitor:  Weights, labs, intake  Reason for Assessment: Consult  57 y.o. male  Admitting Dx: Tachycardia  ASSESSMENT: Admitted with tachycardia. Hx of stage IIIA small cell lung CA currently undergoing systemic chemotherapy with carboplatin and radiation (s/p 3 cycles), last dose on 04/27/13. Also has hx of CAD, DM, COPD, CVA, CHF, MI x 3, HTN, and PNA. Pt has been seen by [facility] RD. Had SLP bedside swallow evaluation 12/20 and was noted to have mild aspiration risk with recommendations for thin liquids. Noted pt with confusion yesterday.   Met with pt who reports eating 2 meals/day at home and drinking 7-8 Ensures/day. Pt continues to c/o pain with swallowing but states he takes Carafate and Magic Mouthwash at home that help him with this. Reports eating eggs this morning, however pt on a full liquid diet and nurse tech reports pt did not eat eggs - pt may be still confused. PO intake documented as 25% of meals.   Potassium low, getting oral replacement.   Height: Ht Readings from Last 1 Encounters:  04/28/13 5\' 10"  (1.778 m)    Weight: Wt Readings from Last 1 Encounters:  05/02/13 212 lb 1.3 oz (96.2 kg)    Ideal Body Weight: 166 lb  % Ideal Body Weight: 127%  Wt Readings from  Last 10 Encounters:  05/02/13 212 lb 1.3 oz (96.2 kg)  04/26/13 222 lb (100.699 kg)  04/20/13 225 lb (102.059 kg)  04/12/13 221 lb 11.2 oz (100.562 kg)  03/30/13 233 lb 6.4 oz (105.87 kg)  03/29/13 234 lb 9.6 oz (106.414 kg)  03/23/13 236 lb 9.6 oz (107.321 kg)  03/22/13 230 lb 9.6 oz (104.599 kg)  03/16/13 230 lb 8 oz (104.554 kg)  03/07/13 232 lb 11.2 oz (105.552 kg)    Usual Body Weight: 230 lb last month  % Usual Body Weight: 92%  BMI:  Body mass index is 30.43 kg/(m^2). Class I obesity  Estimated Nutritional Needs: Kcal: 1900-2100 Protein: 90-110g Fluid: 1.9-2.1L/day  Skin: WNL  Diet Order: Full Liquid  EDUCATION NEEDS: -No education needs identified at this time   Intake/Output Summary (Last 24 hours) at 05/02/13 1039 Last data filed at 05/02/13 0900  Gross per 24 hour  Intake    700 ml  Output   1175 ml  Net   -475 ml    Last BM: 12/22  Labs:   Recent Labs Lab 04/30/13 0352 05/01/13 0340 05/02/13 0350  NA 132* 134* 135  K 4.1 3.8 3.3*  CL 96 99 97  CO2 26 26 26   BUN 22 20 13   CREATININE 1.39* 1.31 1.20  CALCIUM 8.8 9.2 9.0  GLUCOSE 158* 111* 176*    CBG (last 3)   Recent Labs  05/01/13 1702 05/01/13 2222 05/02/13 0829  GLUCAP 179* 159* 191*    Scheduled Meds: . atorvastatin  40  mg Oral QHS  . diltiazem  240 mg Oral Daily  . famotidine  20 mg Oral BID  . fentaNYL  50 mcg Transdermal Q72H  . haloperidol  2 mg Oral BID  . insulin aspart  0-15 Units Subcutaneous TID WC  . insulin aspart  0-5 Units Subcutaneous QHS  . insulin detemir  10 Units Subcutaneous Daily  . isosorbide mononitrate  60 mg Oral Daily  . metoprolol tartrate  25 mg Oral BID  . mometasone-formoterol  2 puff Inhalation BID  . sodium chloride  3 mL Intravenous Q12H  . sucralfate  1 g Oral TID WC & HS  . zolpidem  5 mg Oral QHS    Continuous Infusions:   Past Medical History  Diagnosis Date  . CAD (coronary artery disease)     a. PCI 3/08 to OM2 and mid CFX;  b.  NSTEMI 6/10:  Prior stents patent, 90% dCFX,  mRCA occl (old) with collats; EF 55% on LV-gram => Xience DES 2.5 x 23 mm to distal CFX;  c. abnl MV 12/13 => LHC 04/23/12:  pLAD 50%, oOM1 80%, long stented segment of the CFX extending into the PLOM with the PLOM totally occluded ostially, CFX 70% ISR, mCFX 80% ISR, pRCA chronically occluded. Med Rx planned  . Diabetes mellitus type II   . Hyperlipidemia   . COPD (chronic obstructive pulmonary disease)     Quit tobacco 09/2009; Gold Stage 2 with asthma component - FEV1 1.53 L/54%, 14% fev1 BD response, DLCO 54% - July 2011; MM genotype 01/07/10; unable to afford spiriva and unwilling to try ics due to prior renal failure: state to Dr. Marchelle Gearing, Aug 2011; started on atrovent HFA fall 2011. no desturation on walk test Dec 2011  . CKD (chronic kidney disease)   . Obesity   . History of CVA (cerebrovascular accident) 2007    Without residual defecits  . 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 no further rx  . CHF (congestive heart failure)   . Myocardial infarction     x3  . Hypertension   . Asthma     as child  . Shortness of breath     constant/wears 2 liters  . Pneumonia     02/14/2013 in hospital  . Stroke     7 years- no problems   . Cancer     lung cancer-02/14/2013  . Blood transfusion without reported diagnosis     Past Surgical History  Procedure Laterality Date  . Wedge resection  03/28/2010  . Coronary stent placement  2009  . Cardiac catheterization  2009  . Endobronchial ultrasound Bilateral 02/28/2013    Procedure: ENDOBRONCHIAL ULTRASOUND;  Surgeon: Kalman Shan, MD;  Location: [facility];  Service: Cardiopulmonary;   Laterality: Bilateral;    Levon Hedger MS, RD, LDN [phone] Pager [phone] After Hours Pager

## 2013-05-03 ENCOUNTER — Telehealth: Payer: Self-pay | Admitting: Medical Oncology

## 2013-05-03 ENCOUNTER — Other Ambulatory Visit: Payer: BC Managed Care – PPO

## 2013-05-03 DIAGNOSIS — R1310 Dysphagia, unspecified: Secondary | ICD-10-CM

## 2013-05-03 DIAGNOSIS — R859 Unspecified abnormal finding in specimens from digestive organs and abdominal cavity: Secondary | ICD-10-CM

## 2013-05-03 DIAGNOSIS — R933 Abnormal findings on diagnostic imaging of other parts of digestive tract: Secondary | ICD-10-CM

## 2013-05-03 DIAGNOSIS — O4190X0 Disorder of amniotic fluid and membranes, unspecified, unspecified trimester, not applicable or unspecified: Secondary | ICD-10-CM

## 2013-05-03 LAB — COMPREHENSIVE METABOLIC PANEL
AST: 15 U/L (ref 0–37)
Alkaline Phosphatase: 97 U/L (ref 39–117)
BUN: 14 mg/dL (ref 6–23)
CO2: 25 mEq/L (ref 19–32)
Calcium: 9.5 mg/dL (ref 8.4–10.5)
Chloride: 100 mEq/L (ref 96–112)
Creatinine, Ser: 1.36 mg/dL — ABNORMAL HIGH (ref 0.50–1.35)
GFR calc Af Amer: 65 mL/min — ABNORMAL LOW (ref >90)
GFR calc non Af Amer: 56 mL/min — ABNORMAL LOW (ref >90)
Glucose, Bld: 175 mg/dL — ABNORMAL HIGH (ref 70–99)
Potassium: 4 mEq/L (ref 3.5–5.1)
Total Bilirubin: 0.9 mg/dL (ref 0.3–1.2)

## 2013-05-03 LAB — CBC WITH DIFFERENTIAL/PLATELET
Basophils Absolute: 0.1 10*3/uL (ref 0.0–0.1)
Eosinophils Relative: 1 % (ref 0–5)
Lymphocytes Relative: 6 % — ABNORMAL LOW (ref 12–46)
Lymphs Abs: 0.5 10*3/uL — ABNORMAL LOW (ref 0.7–4.0)
MCH: 31.1 pg (ref 26.0–34.0)
MCV: 84.6 fL (ref 78.0–100.0)
Monocytes Relative: 18 % — ABNORMAL HIGH (ref 3–12)
Neutrophils Relative %: 74 % (ref 43–77)
Platelets: 32 10*3/uL — ABNORMAL LOW (ref 150–400)
RBC: 2.93 MIL/uL — ABNORMAL LOW (ref 4.22–5.81)
RDW: 14.7 % (ref 11.5–15.5)
WBC: 7.8 10*3/uL (ref 4.0–10.5)

## 2013-05-03 LAB — GLUCOSE, CAPILLARY: Glucose-Capillary: 162 mg/dL — ABNORMAL HIGH (ref 70–99)

## 2013-05-03 MED ORDER — PANTOPRAZOLE SODIUM 40 MG PO TBEC
40.0000 mg | DELAYED_RELEASE_TABLET | Freq: Two times a day (BID) | ORAL | Status: DC
  Administered 2013-05-03 – 2013-05-06 (×7): 40 mg via ORAL
  Filled 2013-05-03 (×9): qty 1

## 2013-05-03 MED ORDER — RISPERIDONE 1 MG PO TABS
1.0000 mg | ORAL_TABLET | Freq: Two times a day (BID) | ORAL | Status: DC
  Administered 2013-05-03 – 2013-05-06 (×7): 1 mg via ORAL
  Filled 2013-05-03 (×8): qty 1

## 2013-05-03 MED ORDER — HALOPERIDOL LACTATE 5 MG/ML IJ SOLN
5.0000 mg | Freq: Four times a day (QID) | INTRAMUSCULAR | Status: DC | PRN
  Administered 2013-05-03: 5 mg via INTRAVENOUS
  Filled 2013-05-03: qty 1

## 2013-05-03 MED ORDER — PANTOPRAZOLE SODIUM 40 MG IV SOLR
40.0000 mg | Freq: Two times a day (BID) | INTRAVENOUS | Status: DC
  Filled 2013-05-03 (×2): qty 40

## 2013-05-03 MED ORDER — ZOLPIDEM TARTRATE 10 MG PO TABS
10.0000 mg | ORAL_TABLET | Freq: Every day | ORAL | Status: DC
  Administered 2013-05-03 – 2013-05-05 (×3): 10 mg via ORAL
  Filled 2013-05-03 (×3): qty 1

## 2013-05-03 NOTE — Progress Notes (Signed)
Physical Therapy Treatment Patient Details Name: LISTER BRIZZI MRN: [id number] DOB: 1956/03/01 Today's Date: 05/03/2013 Time: 0902-0919 PT Time Calculation (min): 17 min  PT Assessment / Plan / Recommendation  History of Present Illness 57 yo male admitted with A fib with RVR, delirium, pancytopenia. Hx of lung cancer, undergoing chemo, DM, HTN, CAD   PT Comments   Pt in bed with sitter in room.  Assisted pt OOB to amb in hallway.  Pt still demonstrates impaired safety cognition and unsteady/staggered gait.  HIGH FALL RISK.    Follow Up Recommendations  Home health PT;Supervision/Assistance - 24 hour     Does the patient have the potential to tolerate intense rehabilitation     Barriers to Discharge        Equipment Recommendations       Recommendations for Other Services    Frequency Min 3X/week   Progress towards PT Goals Progress towards PT goals: Progressing toward goals  Plan      Precautions / Restrictions Precautions Precautions: Fall Restrictions Weight Bearing Restrictions: No    Pertinent Vitals/Pain No c/o pain      Mobility  Bed Mobility Bed Mobility: Supine to Sit;Sit to Supine Supine to Sit: 6: Modified independent (Device/Increase time) Sit to Supine: 6: Modified independent (Device/Increase time) Transfers Transfers: Sit to Stand;Stand to Sit Sit to Stand: 5: Supervision;From bed Stand to Sit: 5: Supervision;To bed Details for Transfer Assistance: definite use of legs against bed during sit to stand Ambulation/Gait Ambulation/Gait Assistance: 4: Min guard;4: Min assist Ambulation Distance (Feet): 184 Feet Assistive device: None Ambulation/Gait Assistance Details: Moderate unsteady/drunken gait with impaired safety cognition and thought process.  Pt easily gets distracted and required repeat cueing to stay on task. Noted increased dyspnea on exersion RA 94% HR 108.  Pt required cueing to cease talking and just breath.    Gait Pattern: Step-through  pattern General Gait Details: HIGH FALL RISK     PT Goals (current goals can now be found in the care plan section)    Visit Information  Last PT Received On: 05/03/13 Assistance Needed: +1 History of Present Illness: 57 yo male admitted with A fib with RVR, delirium, pancytopenia. Hx of lung cancer, undergoing chemo, DM, HTN, CAD    Subjective Data      Cognition       Balance     End of Session PT - End of Session Equipment Utilized During Treatment: Gait belt Activity Tolerance: Patient tolerated treatment well Patient left: in bed;with call bell/phone within reach;with nursing/sitter in room   Felecia Shelling  PTA [facility]  Acute  Rehab Pager      [phone]

## 2013-05-03 NOTE — Progress Notes (Signed)
Patient belongings were transported and counted with [facility] this evening after he began pulling out valuables and attempting to disassemble [place] and his own property.  Patient belongings are in Security lock box in addition to oxygen tank at nursing station.  Patient was given multiple PRNs in attempt to reduce high anxiety which were ineffective.

## 2013-05-03 NOTE — Progress Notes (Signed)
Clinical Social Work Department CLINICAL SOCIAL WORK PSYCHIATRY SERVICE LINE ASSESSMENT 05/03/2013  Patient:  [name]  Account:  [id number]  Admit Date:  04/28/2013  Clinical Social Worker:  Unk Lightning, LCSW  Date/Time:  05/03/2013 11:00 AM Referred by:  Physician  Date referred:  05/03/2013 Reason for Referral  Psychosocial assessment   Presenting Symptoms/Problems (In the person's/family's own words):   Psych consulted due to confusion and hallucinations.   Abuse/Neglect/Trauma History (check all that apply)  Denies history   Abuse/Neglect/Trauma Comments:   Psychiatric History (check all that apply)  Denies history   Psychiatric medications:  Risperdal 1 mg  Ambien 10 mg   Current Mental Health Hospitalizations/Previous Mental Health History:   Patient denies any previous MH diagnosis or treatment. Patient reports that even though he has several medical concerns he is has never felt depressed or anxious.   Current provider:   None   Place and Date:   N/A   Current Medications:   Scheduled Meds:      . atorvastatin  40 mg Oral QHS  . diltiazem  240 mg Oral Daily  . feeding supplement (ENSURE COMPLETE)  237 mL Oral TID WC & HS  . fentaNYL  50 mcg Transdermal Q72H  . insulin aspart  0-15 Units Subcutaneous TID WC  . insulin aspart  0-5 Units Subcutaneous QHS  . insulin detemir  10 Units Subcutaneous Daily  . isosorbide mononitrate  60 mg Oral Daily  . metoprolol tartrate  25 mg Oral BID  . mometasone-formoterol  2 puff Inhalation BID  . pantoprazole  40 mg Oral BID  . risperiDONE  1 mg Oral BID  . sodium chloride  3 mL Intravenous Q12H  . sucralfate  1 g Oral TID WC & HS  . zolpidem  10 mg Oral QHS        Continuous Infusions:      PRN Meds:.acetaminophen, acetaminophen, diphenhydrAMINE, diphenhydrAMINE-zinc acetate, haloperidol lactate, HYDROcodone-acetaminophen, levalbuterol, morphine injection, nitroGLYCERIN, ondansetron (ZOFRAN) IV, ondansetron        Previous Impatient Admission/Date/Reason:   None reported   Emotional Health / Current Symptoms    Suicide/Self Harm  None reported   Suicide attempt in the past:   Patient denies any SI or HI.   Other harmful behavior:   None reported   Psychotic/Dissociative Symptoms  Delusional   Other Psychotic/Dissociative Symptoms:   Per chart review, patient was believing that people were living in the walls.    Attention/Behavioral Symptoms  Inattentive   Other Attention / Behavioral Symptoms:   Patient is confused and has to be redirected several times.    Cognitive Impairment  Unable to accurately assess   Other Cognitive Impairment:   Patient is confused and answers questions inappropriately.    Mood and Adjustment  Flat    Stress, Anxiety, Trauma, Any Recent Loss/Stressor  None reported   Anxiety (frequency):   N/A   Phobia (specify):   N/A   Compulsive behavior (specify):   N/A   Obsessive behavior (specify):   N/A   Other:   N/A   Substance Abuse/Use  None   SBIRT completed (please refer for detailed history):  N  Self-reported substance use:   Patient denies any substance use.   Urinary Drug Screen Completed:  N Alcohol level:   N/A    Environmental/Housing/Living Arrangement  Stable housing   Who is in the home:   Alone   Emergency contact:  Restaurant manager, fast food  Patient's Strengths and Goals (patient's own words):   Patient reports he has several siblings that are supportive.   Clinical Social Worker's Interpretive Summary:   CSW received referral in order to complete psychosocial assessment. CSW reviewed chart and met with patient at bedside. Patient has sitter present for safety. CSW introduced myself and explained role.    Patient reports that he is confused about why he was admitted to the hospital but reports that he has had heart attacks in the past and recently diagnosed with brain cancer. Patient  reports that he is on disability now and lives at home alone in [place]. 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    Patient reports he comes from a big family and has about 12 siblings. Patient states that he is very concerned about DC and needs to get home in order to prepare for Christmas. Patient is aware that he is in the hospital but is fixated on getting his belongings and going home in order to start cooking for Christmas. Patient is confused and has to be redirected multiple times throughout the assessment.    Patient denies any previous MH diagnosis. Patient served time in [employer] several years ago but reports he does not want to discuss that time in his life. Patient reports that he has faith in God that he will protect him and keep him safe. Patient denies any depression or anxiety and contributes no MH problems to strong relationship with God. Patient reports struggles to fully participate in assessment and answers questions inappropriately at times.    CSW will continue to follow and will assist after psych MD makes recommendations.   Disposition:  Recommend Psych CSW continuing to support while in hospital   [place], [place] [phone]

## 2013-05-03 NOTE — Consult Note (Signed)
Referring Provider: No ref. provider found Primary Care Physician:  Clydell Hakim, MD Primary Gastroenterologist:  None, unassigned  Reason for Consultation:  Abnormal CT scan of the esophagus  HPI: [name] is a 57 y.o. 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  He was admitted on 12/18 when he was at the cancer Center and was found to have heart rates in the 130s to 140s with an EKG showing atrial fibrillation with rapid ventricular for response.  He was receiving a blood transfusion when this occurred.  His oncologist, Dr. Sofie Hartigan, recommended that patient be transferred to the emergency department for further evaluation and treatment. On presentation a repeat EKG revealed atrial fibrillation with ventricular rate of 139. He was started on IV diltiazem in the emergency department where he spontaneously converted. Patient was seen and evaluated by Dr. Excell Seltzer of cardiology.  During his hospital stary, on 12/22, he underwent a CT angio of the chest with and without contrast, which showed the following:  IMPRESSION:  There is a scarring and atelectatic change remain stable. There is  extensive underlying emphysema. Several prominent lymph nodes remain stable compared to recent prior study; sizes of these lymph nodes were summarized in the most recent prior chest CT report.   No demonstrable pulmonary embolus.   Fatty liver.   Rather extensive thickening throughout portions of the mid and distal esophagus. These are changes that could be secondary to post chemotherapy or radiation therapy. They also may indicate esophagitis or potentially neoplastic change. The degree of thickening in this area is sufficient to warrant further evaluation with either barium swallow or direct visualization to further assess.   Reports painful swallowing  at home for several weeks and had been taking carafate suspension and magic mouthwash.  Here is only on carafate suspension and was just started on BID PPI today.  Not eating well.  Very confused and agitated since in the hospital and he has a sitter with him right now.  Psych is supposed to see the patient today.  Needs EGD but platelets are too low currently at 32,000.   Past Medical History  Diagnosis Date  . CAD (coronary artery disease)     a. PCI 3/08 to OM2 and mid CFX; b.  NSTEMI 6/10:  Prior stents patent, 90% dCFX,  mRCA occl (old) with collats; EF 55% on LV-gram => Xience DES 2.5 x 23 mm to distal CFX;  c. abnl MV 12/13 => LHC 04/23/12:  pLAD 50%, oOM1 80%, long stented segment of the CFX extending into the PLOM with the PLOM totally occluded ostially, CFX 70% ISR, mCFX 80% ISR, pRCA chronically occluded. Med Rx planned  . Diabetes mellitus type II   . Hyperlipidemia   . COPD (chronic obstructive pulmonary disease)     Quit tobacco 09/2009; Gold Stage 2 with asthma component - FEV1 1.53 L/54%, 14% fev1 BD response, DLCO 54% - July 2011; MM genotype 01/07/10; unable to afford spiriva and unwilling to try ics due to prior renal failure: state to Dr. Marchelle Gearing, Aug 2011; started on atrovent HFA fall 2011. no desturation on walk test Dec 2011  . CKD (chronic kidney disease)   . Obesity   . History of CVA (cerebrovascular accident) 2007    Without residual defecits  . 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 no further rx  . CHF (congestive heart failure)   . Myocardial infarction     x3  . Hypertension   . Asthma     as child  . Shortness of breath     constant/wears 2 liters  . Pneumonia      02/14/2013 in hospital  . Stroke     7 years- no problems   . Cancer     lung cancer-02/14/2013  . Blood transfusion without reported diagnosis     Past Surgical History  Procedure Laterality Date  . Wedge resection  03/28/2010  . Coronary stent placement  2009  . Cardiac catheterization  2009  . Endobronchial ultrasound Bilateral 02/28/2013    Procedure: ENDOBRONCHIAL ULTRASOUND;  Surgeon: Kalman Shan, MD;  Location: [facility];  Service: Cardiopulmonary;  Laterality: Bilateral;    Prior to Admission medications   Medication Sig Start Date End Date Taking? Authorizing Provider  albuterol (PROAIR HFA) 108 (90 BASE) MCG/ACT inhaler Inhale 2 puffs into the lungs every 6 (six) hours as needed for wheezing or shortness of breath.   Yes Historical Provider, MD  albuterol (PROVENTIL) (2.5 MG/3ML) 0.083% nebulizer solution Take 3 mLs (2.5 mg total) by nebulization 5 (five) times daily as needed for wheezing or shortness of breath. 02/16/13  Yes Leroy Sea, MD  Alum & Mag Hydroxide-Simeth (MAGIC MOUTHWASH W/LIDOCAINE) SOLN Take 5 mLs by mouth 4 (four) times daily as needed for mouth pain. 04/12/13  Yes Lurline Hare, MD  amLODipine (NORVASC) 2.5 MG tablet Take 2.5 mg by mouth daily.   Yes Historical Provider, MD  aspirin EC 81 MG tablet Take 81 mg by mouth daily.   Yes Historical Provider, MD  atorvastatin (LIPITOR) 40 MG tablet Take 40 mg by mouth at bedtime.   Yes Historical Provider, MD  bisoprolol (ZEBETA) 5 MG tablet Take 5 mg by mouth daily.   Yes Historical Provider, MD  clopidogrel (PLAVIX) 75 MG tablet Take 1 tablet (75 mg total) by mouth daily. Resume once cleared by the lung doctor after bronchoscopy and lung biopsy 02/21/13  Yes Leroy Sea, MD  fentaNYL (DURAGESIC - DOSED MCG/HR) 25 MCG/HR patch Place 1 patch (25 mcg total) onto the skin every 3 (three) days. 04/26/13  Yes Lurline Hare, MD  Fluticasone Furoate-Vilanterol (BREO ELLIPTA) 100-25 MCG/INH AEPB  Inhale 1 puff into the lungs 2 (two) times daily. 02/16/13  Yes Leroy Sea, MD  glipiZIDE (GLUCOTROL) 10 MG tablet Take 10 mg by mouth 2 (two) times daily before a meal.    Yes Historical Provider, MD  HYDROcodone-acetaminophen (HYCET) 7.5-325 mg/15 ml solution Take 10 mLs by mouth 4 (four) times daily as needed for moderate pain. 04/20/13  Yes Lurline Hare, MD  isosorbide mononitrate (IMDUR) 60 MG 24 hr tablet Take 1 tablet (60 mg total) by mouth daily. 08/10/12  Yes Laurey Morale, MD  LORazepam (ATIVAN) 1 MG tablet Take 1 tablet (1 mg total) by mouth every 8 (eight) hours. 04/25/13  Yes Billie Lade, MD  losartan (COZAAR) 50 MG tablet Take 1 tablet (50 mg total) by mouth daily. 08/10/12  Yes Laurey Morale, MD  metFORMIN (GLUCOPHAGE-XR) 750 MG 24 hr tablet Take 1,500 mg by mouth daily with breakfast.  Yes Historical Provider, MD  moxifloxacin (AVELOX) 400 MG tablet Take 400 mg by mouth daily at 8 pm. 5 days  Started 04/27/13 04/27/13  Yes Si Gaul, MD  nitroGLYCERIN (NITROSTAT) 0.4 MG SL tablet Place 0.4 mg under the tongue every 5 (five) minutes as needed for chest pain. May repeat up to 3 doses   Yes Historical Provider, MD  ondansetron (ZOFRAN ODT) 4 MG disintegrating tablet Take 1 tablet (4 mg total) by mouth every 8 (eight) hours as needed for nausea or vomiting. 04/12/13  Yes Lurline Hare, MD  prochlorperazine (COMPAZINE) 10 MG tablet Take 10 mg by mouth every 6 (six) hours as needed for nausea or vomiting.   Yes Historical Provider, MD  promethazine (PHENERGAN) 25 MG suppository Place 1 suppository (25 mg total) rectally every 6 (six) hours as needed for nausea or vomiting. 04/12/13  Yes Lurline Hare, MD  sucralfate (CARAFATE) 1 GM/10ML suspension Take 10 mLs (1 g total) by mouth 4 (four) times daily -  with meals and at bedtime. 04/05/13  Yes Lurline Hare, MD  traMADol (ULTRAM) 50 MG tablet Take 1 tablet (50 mg total) by mouth every 6 (six) hours as needed. 04/20/13   Yes Lurline Hare, MD    Current Facility-Administered Medications  Medication Dose Route Frequency Provider Last Rate Last Dose  . acetaminophen (TYLENOL) tablet 650 mg  650 mg Oral Q6H PRN Jeralyn Bennett, MD   650 mg at 04/30/13 1307   Or  . acetaminophen (TYLENOL) suppository 650 mg  650 mg Rectal Q6H PRN Jeralyn Bennett, MD      . atorvastatin (LIPITOR) tablet 40 mg  40 mg Oral QHS Jeralyn Bennett, MD   40 mg at 05/02/13 2145  . diltiazem (CARDIZEM CD) 24 hr capsule 240 mg  240 mg Oral Daily Kathleene Hazel, MD   240 mg at 05/02/13 0941  . diphenhydrAMINE (BENADRYL) capsule 25 mg  25 mg Oral Q6H PRN Rolan Lipa, NP   25 mg at 05/03/13 0234  . diphenhydrAMINE-zinc acetate (BENADRYL) 2-0.1 % cream   Topical TID PRN Renae Fickle, MD      . famotidine (PEPCID) tablet 20 mg  20 mg Oral BID Renae Fickle, MD   20 mg at 05/02/13 2145  . feeding supplement (ENSURE COMPLETE) (ENSURE COMPLETE) liquid 237 mL  237 mL Oral TID WC & HS Lavena Bullion, RD   237 mL at 05/02/13 2151  . fentaNYL (DURAGESIC - dosed mcg/hr) 50 mcg  50 mcg Transdermal Q72H Renae Fickle, MD   50 mcg at 05/01/13 2032  . haloperidol lactate (HALDOL) injection 5 mg  5 mg Intravenous Q6H PRN Renae Fickle, MD      . HYDROcodone-acetaminophen (HYCET) 7.5-325 mg/15 ml solution 10 mL  10 mL Oral QID PRN Jeralyn Bennett, MD   10 mL at 05/03/13 0600  . insulin aspart (novoLOG) injection 0-15 Units  0-15 Units Subcutaneous TID WC Jeralyn Bennett, MD   2 Units at 05/03/13 [phone]  . insulin aspart (novoLOG) injection 0-5 Units  0-5 Units Subcutaneous QHS Jeralyn Bennett, MD   2 Units at 04/28/13 2129  . insulin detemir (LEVEMIR) injection 10 Units  10 Units Subcutaneous Daily Renae Fickle, MD   10 Units at 05/02/13 [phone]  . isosorbide mononitrate (IMDUR) 24 hr tablet 60 mg  60 mg Oral Daily Jeralyn Bennett, MD   60 mg at 05/02/13 0940  . levalbuterol (XOPENEX) nebulizer solution 1.25 mg  1.25 mg Nebulization  Q6H PRN Renae Fickle, MD      .  metoprolol tartrate (LOPRESSOR) tablet 25 mg  25 mg Oral BID Renae Fickle, MD   25 mg at 05/02/13 2145  . mometasone-formoterol (DULERA) 200-5 MCG/ACT inhaler 2 puff  2 puff Inhalation BID Renae Fickle, MD   2 puff at 05/02/13 2113  . morphine 2 MG/ML injection 2-4 mg  2-4 mg Intravenous Q4H PRN Rolan Lipa, NP   2 mg at 05/02/13 4098  . nitroGLYCERIN (NITROSTAT) SL tablet 0.4 mg  0.4 mg Sublingual Q5 min PRN Jeralyn Bennett, MD      . ondansetron [facility]) tablet 4 mg  4 mg Oral Q6H PRN Jeralyn Bennett, MD   4 mg at 04/29/13 1650   Or  . ondansetron (ZOFRAN) injection 4 mg  4 mg Intravenous Q6H PRN Jeralyn Bennett, MD   4 mg at 04/29/13 0359  . risperiDONE (RISPERDAL) tablet 1 mg  1 mg Oral BID Renae Fickle, MD      . sodium chloride 0.9 % injection 3 mL  3 mL Intravenous Q12H Jeralyn Bennett, MD   10 mL at 05/02/13 0942  . sucralfate (CARAFATE) 1 GM/10ML suspension 1 g  1 g Oral TID WC & HS Renae Fickle, MD   1 g at 05/02/13 2145  . zolpidem (AMBIEN) tablet 10 mg  10 mg Oral QHS Renae Fickle, MD        Allergies as of 04/28/2013 - Review Complete 04/28/2013  Allergen Reaction Noted  . Erythromycin Other (See Comments) 04/14/2012  . Penicillins Other (See Comments)     Family History  Problem Relation Age of Onset  . Stroke Other   . Diabetes Mother   . Cancer Maternal Uncle     History   Social History  . Marital Status: Single    Spouse Name: N/A    Number of Children: 0  . Years of Education: N/A   Occupational History  . 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 Smoking status: Former Smoker -- 1.00 packs/day for 40 years    Types: Cigarettes    Quit date: 09/09/2009  . Smokeless tobacco: Not on file  . Alcohol Use: No     Comment: rarely  . Drug Use: No  . Sexual Activity: Not on file   Other Topics Concern  . Not on file    Social History Narrative   Single   Here with sister             Review of Systems: Ten point ROS is O/W negative except as mentioned in HPI.  Physical Exam: Vital signs in last 24 hours: Temp:  [97.3 F (36.3 C)-98.5 F (36.9 C)] 97.3 F (36.3 C) (12/23 0505) Pulse Rate:  [103-106] 105 (12/23 0505) Resp:  [18-20] 18 (12/23 0505) BP: (95-123)/(70-76) 121/71 mmHg (12/23 0505) SpO2:  [91 %-97 %] 97 % (12/23 0505) Weight:  [225 lb (102.059 kg)] 225 lb (102.059 kg) (12/22 1105) Last BM Date: 05/02/13 General:   Alert, Well-developed, well-nourished, pleasant and cooperative in NAD; anxious. Head:  Normocephalic and atraumatic. Eyes:  Sclera clear, no icterus.  Conjunctiva pink. Ears:  Normal auditory acuity. Mouth:  No deformity or lesions.   Lungs:  Wheezing noted B/L. Heart:  Tachy but regular rhythm.  No M/R/G. Abdomen:  Soft, non-distended.  BS present.  Non-tender.   Rectal:  Deferred  Msk:  Symmetrical without gross deformities. Pulses:  Normal pulses noted. Extremities:  Without clubbing or  edema. Neurologic:  Grossly normal neurologically. Skin:  Intact without significant lesions or rashes.. Psych:  Alert and cooperative. Normal mood and affect. Anxious and has some pressured speech.  Intake/Output from previous day: 12/22 0701 - 12/23 0700 In: 440 [P.O.:360] Out: -   Lab Results:  Recent Labs  05/01/13 0340 05/02/13 0350 05/03/13 0404  WBC 0.5* 2.3* 7.8  HGB 8.4* 8.4* 9.1*  HCT 22.9* 23.5* 24.8*  PLT 57* 36* 32*   BMET  Recent Labs  05/01/13 0340 05/02/13 0350 05/03/13 0404  NA 134* 135 136  K 3.8 3.3* 4.0  CL 99 97 100  CO2 26 26 25   GLUCOSE 111* 176* 175*  BUN 20 13 14   CREATININE 1.31 1.20 1.36*  CALCIUM 9.2 9.0 9.5   LFT  Recent Labs  05/03/13 0404  PROT 6.3  ALBUMIN 3.1*  AST 15  ALT 15  ALKPHOS 97  BILITOT 0.9   Studies/Results: Ct Head Wo Contrast  05/01/2013   CLINICAL DATA:  Increased confusion/delirium  EXAM:  CT HEAD WITHOUT CONTRAST  TECHNIQUE: Contiguous axial images were obtained from the base of the skull through the vertex without intravenous contrast.  COMPARISON:  MRI brain dated 03/14/2013  FINDINGS: No evidence of parenchymal hemorrhage or extra-axial fluid collection. No mass lesion, mass effect, or midline shift.  No CT evidence of acute infarction.  Intracranial atherosclerosis.  Cerebral volume is within normal limits.  No ventriculomegaly.  The visualized paranasal sinuses are essentially clear. The mastoid air cells are unopacified.  No evidence of calvarial fracture.  IMPRESSION: No evidence of acute intracranial abnormality.   Electronically Signed   By: Charline Bills M.D.   On: 05/01/2013 10:48   Ct Angio Chest Pe W/cm &/or Wo Cm  05/02/2013   CLINICAL DATA:  Hypoxia history of lung carcinoma  EXAM: CT ANGIOGRAPHY CHEST WITH CONTRAST  TECHNIQUE: Multidetector CT imaging of the chest was performed using the standard protocol during bolus administration of intravenous contrast. Multiplanar CT image reconstructions including MIPs were obtained to evaluate the vascular anatomy.  CONTRAST:  OMNIPAQUE IOHEXOL 350 MG/ML SOLN  COMPARISON:  CT chest April 18, 2013; chest radiograph April 29, 2013  FINDINGS: There is no demonstrable pulmonary embolus. There is no thoracic aortic aneurysm or dissection.  Underlying emphysematous change, moderately severe, is stable. There is scarring in the superior segment left lower lobe, probably due to post radiation therapy change and previous surgery. There is right mid lung atelectasis, stable. There is some fibrotic type change in the right base in an area of previous surgery.  There is extensive thickening of the wall of the mid to lower esophagus.  There is no new parenchymal lung lesion compared to recent prior study. There are several enlarged lymph nodes which appear stable compared to recent prior study. No new adenopathy is identified. Pericardium  is not thickened.  In the upper abdomen, there is fatty change in the liver. Visualized upper abdominal structures otherwise appear unremarkable. There is degenerative change in the thoracic spine. There are no blastic or lytic bone lesions. Thyroid appears normal.  Review of the MIP images confirms the above findings.  IMPRESSION: There is a scarring and atelectatic change remain stable. There is extensive underlying emphysema. Several prominent lymph nodes remain stable compared to recent prior study; sizes of these lymph nodes were summarized in the most recent prior chest CT report.  No demonstrable pulmonary embolus.  Fatty liver.  Rather extensive thickening throughout portions of the mid and  distal esophagus. These are changes that could be secondary to post chemotherapy or radiation therapy. They also may indicate esophagitis or potentially neoplastic change. The degree of thickening in this area is sufficient to warrant further evaluation with either barium swallow or direct visualization to further assess.   Electronically Signed   By: Bretta Bang M.D.   On: 05/02/2013 11:04    IMPRESSION:  -Abnormal CT scan showing marked thickening in the esophagus:  Likely inflammatory and related to radiation therapy, but infiltrating tumor cannot be excluded.   -Stage IIIa small cell lung cancer, currently undergoing chemo and radiation therapy.     PLAN: -Will need eventual EGD with biopsies eventually once platelet count is at least 50,000.  I discussed with Dr. Malachi Bonds and she will give him two packs of platelets in order for [place] to do EGD tomorrow, 12/24. -BID PPI, carafate suspension four times a day for now.   Issabella Rix D.  05/03/2013, 9:09 AM  Pager number [phone]

## 2013-05-03 NOTE — Evaluation (Signed)
Occupational Therapy Evaluation Patient Details Name: [name] MRN: [id number] DOB: [birth year] Today's Date: 05/03/2013 Time: 4098-1191 OT Time Calculation (min): 21 min  OT Assessment / Plan / Recommendation History of present illness 57 yo male admitted with A fib with RVR, delirium, pancytopenia. Hx of lung cancer, undergoing chemo, DM, HTN, CAD   Clinical Impression   Pt was admitted for the above.  He will benefit from skilled OT with emphasis on safety, activity tolerance, and pursed lip breathing for dyspnea.  Goals in acute are for supervision level.     OT Assessment  Patient needs continued OT Services    Follow Up Recommendations  Home health OT;Supervision/Assistance - 24 hour    Barriers to Discharge      Equipment Recommendations  None recommended by OT    Recommendations for Other Services    Frequency  Min 2X/week    Precautions / Restrictions Precautions Precautions: Fall Restrictions Weight Bearing Restrictions: No   Pertinent Vitals/Pain No pain.  Sats 88- 96% on RA.  Cued for pursed lip breathing. HR 102 - 112    ADL  Grooming: Min guard Where Assessed - Grooming: Supported standing Upper Body Bathing: Supervision/safety Where Assessed - Upper Body Bathing: Unsupported sitting Lower Body Bathing: Min guard Where Assessed - Lower Body Bathing: Supported sit to stand Upper Body Dressing: Supervision/safety Where Assessed - Upper Body Dressing: Unsupported sitting Lower Body Dressing: Min guard Where Assessed - Lower Body Dressing: Supported sit to Pharmacist, hospital: Minimal assistance Toilet Transfer Method: Sit to Barista: Comfort height toilet;Bedside commode (performed 2 transfers) Toileting - Clothing Manipulation and Hygiene: Min guard Where Assessed - Toileting Clothing Manipulation and Hygiene: Sit to stand from 3-in-1 or toilet Equipment Used:  (none) Transfers/Ambulation Related to ADLs: pt needed min A to  go from supine to sit:  NT states he hasn't been needing help.  Pt has a tendency to move quickly and furniture holds.  Does not give notice--just gets up and moves quickly ADL Comments: Pt able to cross legs for adls. Min guard for adls due to unpredictability.  Pt's sats dropped to 88 and 89% with activity:  cued for pursed lip breathing.  HR 112. Began energy conservation education, but pt will need reinforcement    OT Diagnosis: Cognitive deficits;Generalized weakness  OT Problem List: Decreased strength;Impaired balance (sitting and/or standing);Decreased activity tolerance;Cardiopulmonary status limiting activity;Decreased safety awareness OT Treatment Interventions: Self-care/ADL training;DME and/or AE instruction;Patient/family education;Balance training;Energy conservation;Cognitive remediation/compensation   OT Goals(Current goals can be found in the care plan section) Acute Rehab OT Goals Patient Stated Goal: home.  OT Goal Formulation: With patient Time For Goal Achievement: 05/18/12 Potential to Achieve Goals: Good ADL Goals Pt Will Transfer to Toilet: with supervision;ambulating;regular height toilet Pt Will Perform Toileting - Clothing Manipulation and hygiene: with supervision;sit to/from stand Pt Will Perform Tub/Shower Transfer: with supervision;shower seat;Shower transfer;ambulating Additional ADL Goal #1: Pt will demonstrated pursed lip breathing with one cue when dyspnic Additional ADL Goal #2: pt will initiate at least one rest break per session for energy conservation Additional ADL Goal #3: pt will complete adl at supervision level including clothing retrieval  Visit Information  Last OT Received On: 05/03/13 Assistance Needed: +1 History of Present Illness: 57 yo male admitted with A fib with RVR, delirium, pancytopenia. Hx of lung cancer, undergoing chemo, DM, HTN, CAD       Prior Functioning     Home Living Family/patient expects to be discharged to::  Private residence Living Arrangements: Alone Available Help at Discharge: Family;Available PRN/intermittently Additional Comments: states he has a friend staying upstairs now Prior Function Level of Independence: Independent Comments: pt does not want to use any dme Communication Communication: No difficulties         Vision/Perception     Cognition  Cognition Arousal/Alertness: Awake/alert Behavior During Therapy: WFL for tasks assessed/performed Overall Cognitive Status: Impaired/Different from baseline Area of Impairment: Safety/judgement Safety/Judgement: Decreased awareness of safety;Decreased awareness of deficits General Comments: pt moves very quickly:  cues for safety.  Also cues for pursed lip breathing when dyspnic    Extremity/Trunk Assessment Upper Extremity Assessment Upper Extremity Assessment: Generalized weakness (grossly 3+/5 strength bil)     Mobility Bed Mobility Bed Mobility: Supine to Sit;Sit to Supine Supine to Sit: 4: Min assist Sit to Supine: 6: Modified independent (Device/Increase time) Details for Bed Mobility Assistance: pt extended RUE and requested assistance for sitting up Transfers Sit to Stand: 4: Min guard Stand to Sit: 4: Min guard Details for Transfer Assistance: min guard for safety due to unpredictability     Exercise     Balance Static Standing Balance Static Standing - Balance Support: No upper extremity supported Static Standing - Level of Assistance: 5: Stand by assistance Dynamic Standing Balance Dynamic Standing - Balance Support: No upper extremity supported Dynamic Standing - Level of Assistance: 5: Stand by assistance   End of Session OT - End of Session Activity Tolerance: Patient tolerated treatment well Patient left: in bed;with call bell/phone within reach;with nursing/sitter in room  GO     [name] 05/03/2013, 3:48 PM Marica Otter, OTR/L [phone] 05/03/2013

## 2013-05-03 NOTE — Progress Notes (Addendum)
[facility] PROGRESS NOTE  [name]:[id number] DOB: 22-Jun-1955 DOA: 04/28/2013 PCP: Clydell Hakim, MD  Assessment/Plan  A-fib with RVR, resolved, mild persistent sinus tachycardia -  Appreciate cardiology assistance -  Troponins negative -  Continue diltiazem and metoprolol  -  Followup echocardiogram:  Mild LVH, and suture fraction 65%, could not exclude regional wall motion abnormalities, aortic and mitral valves appeared grossly normal, the right ventricle was poorly visualized.   -  May also have component of anxiety:  Increase ativan -  TSH 0.971 wnl -  CT angio chest:  No evidence of PE  ICU delirium now with some mania, pulling out PIV, intermittently confused, not sleeping.  Question paradoxical reaction to benzo, unmasking of underlying disorder.   -  Oriented to person only again this morning -  Last MRI brain was 11/3 and was negative for metastatic disease -  CT head neg for hemorrhage -  Change scheduled haldol to risperidone 1mg  BID -  Increase prn haldol to 5 mg -  Increase ambien -  Psychiatry consultation -  Continue sitter  Febrile neutropenia with fever to 101 Fahrenheit, fevers resolved.  Neutropenia resolved.   -  Blood cultures x2, no growth to date -  Urinalysis neg -  Chest x-ray unremarkable -  D/c cefepime and vancomycin  -  Appreciate hematology and oncology recommendations   Pancytopenia due to chemotherapy, received Neulasta at cancer Center, no schistocytes on smear to suggest DIC, hgb stable, plt drifting down -  Receiving Neupogen -  Transfused 1 unit PRBCs 12/19 -  Transfuse 2 units PRBCs and 2 packs of platelets 12/20 -  Continue to hold aspirin, Plavix, and Lovenox due to low platelets and high risk of bleeding  Type 2 diabetes mellitus, CBG somewhat labile, but generally in the 100s range -  Hold glipizide -  Continue sliding scale insulin  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  Troponins negative, ECG remained  stable.  CT scan with extensive thickening throughout the mid and distal esophagus likely due to esophagitis but cannot rule out neoplastic change -  Cardiology following -  Continue famotidine and Carafate -  Patient states that Magic mouthwash has not helped in the past -  fentanyl patch increased to on 12/21 -  Continue hydrocodone when necessary -   Full liquids per speech -  Nutrition consultation to ensure adequate nutrition on liquids diet:  Still not eating well -  GI consult:  Will transfuse platelets today and plan for EGD tomorrow  Hypertension, BP low normal -  Continue Cardizem and metoprolol  Coronary artery disease with chest pain which is chronic. Chest pain is likely not due to ACS -  EKG without ischemic changes -  Troponins negative   Diet:  Diabetic  Access:  PIV  IVF:  Yes  Proph:  SCD  Code Status: Full  Family Communication: Spoke to patient alone  Disposition Plan:  Pending medically stable, awaiting psych and GI consults today   Consultants:  Cardiology, Doctor [facility]  Oncology  Psychiatry  GI  Procedures:  Chest x-ray  Blood transfusion  Antibiotics:  Cefepime 12/19   HPI/Subjective:  Patient agitated and confused last night requiring sedative medications.  Still not eating due to odynophagia.    Objective: Filed Vitals:   05/02/13 1105 05/02/13 1413 05/02/13 2155 05/03/13 0505  BP:  95/72 122/70 121/71  Pulse:  103 106 105  Temp:  97.3 F (36.3 C) 98.1 F (36.7 C) 97.3 F (  36.3 C)  TempSrc:  Oral Oral Oral  Resp:  20 20 18   Height: 5\' 10"  (1.778 m)     Weight: 102.059 kg (225 lb)     SpO2:  91% 97% 97%    Intake/Output Summary (Last 24 hours) at 05/03/13 0720 Last data filed at 05/03/13 4098  Gross per 24 hour  Intake    440 ml  Output      0 ml  Net    440 ml   Filed Weights   04/28/13 2015 05/02/13 0400 05/02/13 1105  Weight: 96.9 kg (213 lb 10 oz) 96.2 kg (212 lb 1.3 oz) 102.059 kg (225 lb)     Exam:   General:   overweight Caucasian male, No acute distress, Alopecia   HEENT:  NCAT, MMM, no thrush  Cardiovascular:   Tachycardic, RR, nl S1, S2 no mrg, 2+ pulses, warm extremities  Respiratory:  CTAB, no increased WOB  Abdomen:   NABS, soft, NT/ND  MSK:   Normal tone and bulk, no LEE  Neuro:  Grossly intact  Skin:  Hand-sized area mid-back with hyperpigmentation, but also some light erythema and excoriations with skin breakdown, healing  Psych:  Pressured speech, starting to become more difficult to interrupt, oriented to person only today  Data Reviewed: Basic Metabolic Panel:  Recent Labs Lab 04/29/13 0415 04/30/13 0352 05/01/13 0340 05/02/13 0350 05/03/13 0404  NA 130* 132* 134* 135 136  K 4.1 4.1 3.8 3.3* 4.0  CL 95* 96 99 97 100  CO2 26 26 26 26 25   GLUCOSE 216* 158* 111* 176* 175*  BUN 23 22 20 13 14   CREATININE 1.38* 1.39* 1.31 1.20 1.36*  CALCIUM 8.7 8.8 9.2 9.0 9.5   Liver Function Tests:  Recent Labs Lab 04/27/13 0859 04/30/13 0352 05/01/13 0340 05/02/13 0350 05/03/13 0404  AST 16 8 14 12 15   ALT 15 10 15 14 15   ALKPHOS 95 79 83 88 97  BILITOT 1.40* 1.4* 3.2* 1.0 0.9  PROT 6.4 6.2 6.3 6.2 6.3  ALBUMIN 3.3* 3.0* 3.0* 2.9* 3.1*   No results found for this basename: LIPASE, AMYLASE,  in the last 168 hours No results found for this basename: AMMONIA,  in the last 168 hours CBC:  Recent Labs Lab 04/28/13 1430  04/29/13 1045 04/30/13 0352 05/01/13 0340 05/02/13 0350 05/03/13 0404  WBC 0.2*  < > 0.1* 0.2* 0.5* 2.3* 7.8  NEUTROABS 0.1*  --   --  0.0* 0.3* 1.5* 5.7  HGB 7.6*  < > 7.3* 6.9* 8.4* 8.4* 9.1*  HCT 21.3*  < > 19.9* 19.4* 22.9* 23.5* 24.8*  MCV 86.9  < > 86.1 86.6 83.9 84.5 84.6  PLT 41*  < > 25* 12* 57* 36* 32*  < > = values in this interval not displayed. Cardiac Enzymes:  Recent Labs Lab 04/28/13 1430 04/28/13 1924 04/29/13 0050 04/29/13 1045  TROPONINI <0.30 <0.30 <0.30 <0.30   BNP (last 3  results)  Recent Labs  02/14/13 1953 04/28/13 1430  PROBNP 290.3* 678.8*   CBG:  Recent Labs Lab 05/01/13 2222 05/02/13 0829 05/02/13 1205 05/02/13 1706 05/02/13 2152  GLUCAP 159* 191* 90 150* 116*    Recent Results (from the past 240 hour(s))  MRSA PCR SCREENING     Status: None   Collection Time    04/28/13  6:55 PM      Result Value Range Status   MRSA by PCR NEGATIVE  NEGATIVE Final   Comment:  The GeneXpert MRSA Assay (FDA     approved for NASAL specimens     only), is one component of a     comprehensive MRSA colonization     surveillance program. It is not     intended to diagnose MRSA     infection nor to guide or     monitor treatment for     MRSA infections.  CULTURE, BLOOD (ROUTINE X 2)     Status: None   Collection Time    04/29/13 12:50 AM      Result Value Range Status   Specimen Description BLOOD RIGHT ARM   Final   Special Requests BOTTLES DRAWN AEROBIC AND ANAEROBIC 5CC   Final   Culture  Setup Time     Final   Value: 04/29/2013 08:55     Performed at [facility]   Culture     Final   Value:        BLOOD CULTURE RECEIVED NO GROWTH TO DATE CULTURE WILL BE HELD FOR 5 DAYS BEFORE ISSUING A FINAL NEGATIVE REPORT     Performed at [facility]   Report Status PENDING   Incomplete  CULTURE, BLOOD (ROUTINE X 2)     Status: None   Collection Time    04/29/13 12:55 AM      Result Value Range Status   Specimen Description BLOOD LEFT ARM   Final   Special Requests BOTTLES DRAWN AEROBIC AND ANAEROBIC 5CC   Final   Culture  Setup Time     Final   Value: 04/29/2013 08:55     Performed at [facility]   Culture     Final   Value:        BLOOD CULTURE RECEIVED NO GROWTH TO DATE CULTURE WILL BE HELD FOR 5 DAYS BEFORE ISSUING A FINAL NEGATIVE REPORT     Performed at [facility]   Report Status PENDING   Incomplete     Studies: Ct Head Wo Contrast  05/01/2013   CLINICAL DATA:  Increased confusion/delirium   EXAM: CT HEAD WITHOUT CONTRAST  TECHNIQUE: Contiguous axial images were obtained from the base of the skull through the vertex without intravenous contrast.  COMPARISON:  MRI brain dated 03/14/2013  FINDINGS: No evidence of parenchymal hemorrhage or extra-axial fluid collection. No mass lesion, mass effect, or midline shift.  No CT evidence of acute infarction.  Intracranial atherosclerosis.  Cerebral volume is within normal limits.  No ventriculomegaly.  The visualized paranasal sinuses are essentially clear. The mastoid air cells are unopacified.  No evidence of calvarial fracture.  IMPRESSION: No evidence of acute intracranial abnormality.   Electronically Signed   By: Charline Bills M.D.   On: 05/01/2013 10:48   Ct Angio Chest Pe W/cm &/or Wo Cm  05/02/2013   CLINICAL DATA:  Hypoxia history of lung carcinoma  EXAM: CT ANGIOGRAPHY CHEST WITH CONTRAST  TECHNIQUE: Multidetector CT imaging of the chest was performed using the standard protocol during bolus administration of intravenous contrast. Multiplanar CT image reconstructions including MIPs were obtained to evaluate the vascular anatomy.  CONTRAST:  OMNIPAQUE IOHEXOL 350 MG/ML SOLN  COMPARISON:  CT chest April 18, 2013; chest radiograph April 29, 2013  FINDINGS: There is no demonstrable pulmonary embolus. There is no thoracic aortic aneurysm or dissection.  Underlying emphysematous change, moderately severe, is stable. There is scarring in the superior segment left lower lobe, probably due to post radiation therapy change and previous surgery. There  is right mid lung atelectasis, stable. There is some fibrotic type change in the right base in an area of previous surgery.  There is extensive thickening of the wall of the mid to lower esophagus.  There is no new parenchymal lung lesion compared to recent prior study. There are several enlarged lymph nodes which appear stable compared to recent prior study. No new adenopathy is identified.  Pericardium is not thickened.  In the upper abdomen, there is fatty change in the liver. Visualized upper abdominal structures otherwise appear unremarkable. There is degenerative change in the thoracic spine. There are no blastic or lytic bone lesions. Thyroid appears normal.  Review of the MIP images confirms the above findings.  IMPRESSION: There is a scarring and atelectatic change remain stable. There is extensive underlying emphysema. Several prominent lymph nodes remain stable compared to recent prior study; sizes of these lymph nodes were summarized in the most recent prior chest CT report.  No demonstrable pulmonary embolus.  Fatty liver.  Rather extensive thickening throughout portions of the mid and distal esophagus. These are changes that could be secondary to post chemotherapy or radiation therapy. They also may indicate esophagitis or potentially neoplastic change. The degree of thickening in this area is sufficient to warrant further evaluation with either barium swallow or direct visualization to further assess.   Electronically Signed   By: Bretta Bang M.D.   On: 05/02/2013 11:04    Scheduled Meds: . atorvastatin  40 mg Oral QHS  . diltiazem  240 mg Oral Daily  . famotidine  20 mg Oral BID  . feeding supplement (ENSURE COMPLETE)  237 mL Oral TID WC & HS  . fentaNYL  50 mcg Transdermal Q72H  . haloperidol  2 mg Oral BID  . insulin aspart  0-15 Units Subcutaneous TID WC  . insulin aspart  0-5 Units Subcutaneous QHS  . insulin detemir  10 Units Subcutaneous Daily  . isosorbide mononitrate  60 mg Oral Daily  . metoprolol tartrate  25 mg Oral BID  . mometasone-formoterol  2 puff Inhalation BID  . sodium chloride  3 mL Intravenous Q12H  . sucralfate  1 g Oral TID WC & HS  . zolpidem  5 mg Oral QHS   Continuous Infusions:   Active Problems:   DM   HYPERLIPIDEMIA-MIXED   HYPERTENSION, UNSPECIFIED   CAD, NATIVE VESSEL   Small cell carcinoma of lung   Atrial fibrillation with  RVR   A-fib   Febrile neutropenia   Pancytopenia due to chemotherapy   Delirium due to multiple etiologies   Protein-calorie malnutrition, severe    Time spent: 30 min    Shai Mckenzie, [facility]  [facility] Pager [phone]. If 7PM-7AM, please contact night-coverage at [email or web address], password [facility] 05/03/2013, 7:20 AM  LOS: 5 days

## 2013-05-03 NOTE — Progress Notes (Signed)
OT Cancellation Note  Patient Details Name: [name] MRN: [id number] DOB: 05/17/1955   Cancelled Treatment:    Reason Eval/Treat Not Completed: Fatigue/lethargy limiting ability to participate. Pt sleeping and sitter/nurse tech states that pt just went to sleep and he has not slept in 24 hours. Will re attempt later as time allows/as appropriate  Galen Manila 05/03/2013, 9:53 AM

## 2013-05-03 NOTE — Telephone Encounter (Signed)
I told pam that Dr Arbutus Ped saw pt this am and had conversation with pt.Elita Quick is with him now and pt thinks there is a dog in the room and is very confused. She requests call from Dr Arbutus Ped. Dr Arbutus Ped notified.

## 2013-05-03 NOTE — Consult Note (Signed)
Patient was seen and examined.  X-rays were reviewed.  Full note to follow. CT demonstrates a markedly thickened esophagus.  Changes are most likely inflammatory and related to x-ray therapy.  Infiltrating tumor cannot be excluded.  Endoscopic biopsies may be helpful but we need to defer EGD until platelet count exceeds 50,000.  In the interim I would add twice a day PPI therapy for maximal acid suppression, in view of severe odynoophagia.

## 2013-05-03 NOTE — Consult Note (Signed)
[facility] Face-to-Face Psychiatry Consult   Reason for Consult:  Psychosis Referring Physician:  Dr [name] is an 57 y.o. male.  Assessment: AXIS I:  Psychotic Disorder NOS and delirium AXIS II:  Deferred AXIS III:   Past Medical History  Diagnosis Date  . CAD (coronary artery disease)     a. PCI 3/08 to OM2 and mid CFX; b.  NSTEMI 6/10:  Prior stents patent, 90% dCFX,  mRCA occl (old) with collats; EF 55% on LV-gram => Xience DES 2.5 x 23 mm to distal CFX;  c. abnl MV 12/13 => LHC 04/23/12:  pLAD 50%, oOM1 80%, long stented segment of the CFX extending into the PLOM with the PLOM totally occluded ostially, CFX 70% ISR, mCFX 80% ISR, pRCA chronically occluded. Med Rx planned  . Diabetes mellitus type II   . Hyperlipidemia   . COPD (chronic obstructive pulmonary disease)     Quit tobacco 09/2009; Gold Stage 2 with asthma component - FEV1 1.53 L/54%, 14% fev1 BD response, DLCO 54% - July 2011; MM genotype 01/07/10; unable to afford spiriva and unwilling to try ics due to prior renal failure: state to Dr. Marchelle Gearing, Aug 2011; started on atrovent HFA fall 2011. no desturation on walk test Dec 2011  . CKD (chronic kidney disease)   . Obesity   . History of CVA (cerebrovascular accident) 2007    Without residual defecits  . 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 no further rx  . CHF (congestive heart failure)   . Myocardial infarction     x3  . Hypertension   . Asthma     as child  . Shortness of breath     constant/wears 2 liters  . Pneumonia     02/14/2013 in hospital  . Stroke     7 years- no problems   . Cancer     lung cancer-02/14/2013  . Blood transfusion without reported  diagnosis    AXIS IV:  other psychosocial or environmental problems and problems related to social environment AXIS V:  31-40 impairment in reality testing  Plan:  Supportive therapy provided about ongoing stressors. medication management  Subjective:   [name] is a 57 y.o. male patient admitted with a.fib.  HPI:  Patient seen and chart revived. Patient is 57 years old white man who was admitted for a.fib. Consult called because patient fo und confused, having hallucination and delusions. Patient is poor historian. He believe some one is in his body and he was noticed talking to himself. As per staff he has not slept for more than 24hrs and been combative, pulling his IV and demanding to be discharged. Patient remembers that he is hospital but unable to provide much details. He admitted h/o seeing psychiatrist in 2003 but does not remember the details. He denies taking any psychotropic medication. He was given haldol and benzo but he gets more confused. He denies any suicidal thoughts or homicidal thoughts but appears very anxious, confused, labile and asking too many questions. He is delusional about his body and does not remember agitation and pulling his IV. He was started Risperdal mg po bid and shown some improvement. He admitted very concern  about his physical health and treatment.  Elements:   Location:  medical floor. Quality:  severe. Severity:  moderate.  Past Psychiatric History: Past Medical History  Diagnosis Date  . CAD (coronary artery disease)     a. PCI 3/08 to OM2 and mid CFX; b.  NSTEMI 6/10:  Prior stents patent, 90% dCFX,  mRCA occl (old) with collats; EF 55% on LV-gram => Xience DES 2.5 x 23 mm to distal CFX;  c. abnl MV 12/13 => LHC 04/23/12:  pLAD 50%, oOM1 80%, long stented segment of the CFX extending into the PLOM with the PLOM totally occluded ostially, CFX 70% ISR, mCFX 80% ISR, pRCA chronically occluded. Med Rx planned  . Diabetes mellitus type II   .  Hyperlipidemia   . COPD (chronic obstructive pulmonary disease)     Quit tobacco 09/2009; Gold Stage 2 with asthma component - FEV1 1.53 L/54%, 14% fev1 BD response, DLCO 54% - July 2011; MM genotype 01/07/10; unable to afford spiriva and unwilling to try ics due to prior renal failure: state to Dr. Marchelle Gearing, Aug 2011; started on atrovent HFA fall 2011. no desturation on walk test Dec 2011  . CKD (chronic kidney disease)   . Obesity   . History of CVA (cerebrovascular accident) 2007    Without residual defecits  . 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 no further rx  . CHF (congestive heart failure)   . Myocardial infarction     x3  . Hypertension   . Asthma     as child  . Shortness of breath     constant/wears 2 liters  . Pneumonia     02/14/2013 in hospital  . Stroke     7 years- no problems   . Cancer     lung cancer-02/14/2013  . Blood transfusion without reported diagnosis     reports that he quit smoking about 3 years ago. His smoking use included Cigarettes. He has a 40 pack-year smoking history. He does not have any smokeless tobacco history on file. He reports that he does not drink alcohol or use illicit drugs. Family History  Problem Relation Age of Onset  . Stroke Other   . Diabetes Mother   . Cancer Maternal Uncle      Living Arrangements: Alone   Abuse/Neglect [facility]) Physical Abuse: Denies Verbal Abuse: Denies Sexual Abuse: Denies Allergies:   Allergies  Allergen Reactions  . Erythromycin Other (See Comments)    Reaction while in hospital - pt doesn't know reaction  . Penicillins Other (See Comments)    Childhood reaction - unknown    ACT Assessment Complete:   No:   Past Psychiatric History: Diagnosis:  unknown  unknown  Outpatient Care:  unknown  Substance Abuse Care:  unknown  Self-Mutilation:  denies  Suicidal Attempts:  denies  Homicidal Behaviors:  denies   Violent Behaviors:  denies   Place of Residence:  Lives by himself Marital Status:  unknown Employed/Unemployed:  disable Education:  unknown Family Supports:  some Objective: Blood pressure 114/78, pulse 98, temperature 97.9 F (36.6 C), temperature source Oral, resp. rate 18, height 5\' 10"  (1.778 m), weight 225 lb (102.059 kg), SpO2 95.00%.Body mass index is  32.28 kg/(m^2). Results for orders placed during the hospital encounter of 04/28/13 (from the past 72 hour(s))  COMPREHENSIVE METABOLIC PANEL     Status: Abnormal   Collection Time    05/01/13  3:40 AM      Result Value Range   Sodium 134 (*) 135 - 145 mEq/L   Potassium 3.8  3.5 - 5.1 mEq/L   Chloride 99  96 - 112 mEq/L   CO2 26  19 - 32 mEq/L   Glucose, Bld 111 (*) 70 - 99 mg/dL   BUN 20  6 - 23 mg/dL   Creatinine, Ser 1.61  0.50 - 1.35 mg/dL   Calcium 9.2  8.4 - 09.6 mg/dL   Total Protein 6.3  6.0 - 8.3 g/dL   Albumin 3.0 (*) 3.5 - 5.2 g/dL   AST 14  0 - 37 U/L   ALT 15  0 - 53 U/L   Alkaline Phosphatase 83  39 - 117 U/L   Total Bilirubin 3.2 (*) 0.3 - 1.2 mg/dL   GFR calc non Af Amer 59 (*) >90 mL/min   GFR calc Af Amer 68 (*) >90 mL/min   Comment: (NOTE)     The eGFR has been calculated using the CKD EPI equation.     This calculation has not been validated in all clinical situations.     eGFR's persistently <90 mL/min signify possible Chronic Kidney     Disease.  CBC WITH DIFFERENTIAL     Status: Abnormal   Collection Time    05/01/13  3:40 AM      Result Value Range   WBC 0.5 (*) 4.0 - 10.5 K/uL   Comment: REPEATED TO VERIFY     CRITICAL VALUE NOTED.  VALUE IS CONSISTENT WITH PREVIOUSLY REPORTED AND CALLED VALUE.   RBC 2.73 (*) 4.22 - 5.81 MIL/uL   Hemoglobin 8.4 (*) 13.0 - 17.0 g/dL   Comment: POST  TRANSFUSION SPECIMEN     DELTA CHECK NOTED   HCT 22.9 (*) 39.0 - 52.0 %   MCV 83.9  78.0 - 100.0 fL   MCH 30.8  26.0 - 34.0 pg   MCHC 36.7 (*) 30.0 - 36.0 g/dL   RDW 04.5  40.9 - 81.1 %   Platelets 57 (*) 150 - 400 K/uL   Comment: POST TRANSFUSION SPECIMEN     DELTA CHECK NOTED   Neutrophils Relative % 42 (*) 43 - 77 %   Lymphocytes Relative 27  12 - 46 %   Monocytes Relative 25 (*) 3 - 12 %   Eosinophils Relative 4  0 - 5 %   Basophils Relative 2 (*) 0 - 1 %   Neutro Abs 0.3 (*) 1.7 - 7.7 K/uL   Lymphs Abs 0.1 (*) 0.7 - 4.0 K/uL   Monocytes Absolute 0.1  0.1 - 1.0 K/uL   Eosinophils Absolute 0.0  0.0 - 0.7 K/uL   Basophils Absolute 0.0  0.0 - 0.1 K/uL   RBC Morphology ELLIPTOCYTES     Smear Review LARGE PLATELETS PRESENT    GLUCOSE, CAPILLARY     Status: Abnormal   Collection Time    05/01/13  8:13 AM      Result Value Range   Glucose-Capillary 157 (*) 70 - 99 mg/dL  GLUCOSE, CAPILLARY     Status: None   Collection Time    05/01/13 11:47 AM      Result Value Range   Glucose-Capillary 95  70 - 99 mg/dL   Comment  1 Documented in Chart     Comment 2 Notify RN    GLUCOSE, CAPILLARY     Status: Abnormal   Collection Time    05/01/13  5:02 PM      Result Value Range   Glucose-Capillary 179 (*) 70 - 99 mg/dL   Comment 1 Documented in Chart     Comment 2 Notify RN    GLUCOSE, CAPILLARY     Status: Abnormal   Collection Time    05/01/13 10:22 PM      Result Value Range   Glucose-Capillary 159 (*) 70 - 99 mg/dL   Comment 1 Documented in Chart     Comment 2 Notify RN    COMPREHENSIVE METABOLIC PANEL     Status: Abnormal   Collection Time    05/02/13  3:50 AM      Result Value Range   Sodium 135  135 - 145 mEq/L   Potassium 3.3 (*) 3.5 - 5.1 mEq/L   Chloride 97  96 - 112 mEq/L   CO2 26  19 - 32 mEq/L   Glucose, Bld 176 (*) 70 - 99 mg/dL   BUN 13  6 - 23 mg/dL   Creatinine, Ser 9.81  0.50 - 1.35 mg/dL   Calcium 9.0  8.4 - 19.1 mg/dL   Total Protein 6.2  6.0 - 8.3 g/dL    Albumin 2.9 (*) 3.5 - 5.2 g/dL   AST 12  0 - 37 U/L   ALT 14  0 - 53 U/L   Alkaline Phosphatase 88  39 - 117 U/L   Total Bilirubin 1.0  0.3 - 1.2 mg/dL   GFR calc non Af Amer 65 (*) >90 mL/min   GFR calc Af Amer 76 (*) >90 mL/min   Comment: (NOTE)     The eGFR has been calculated using the CKD EPI equation.     This calculation has not been validated in all clinical situations.     eGFR's persistently <90 mL/min signify possible Chronic Kidney     Disease.  CBC WITH DIFFERENTIAL     Status: Abnormal   Collection Time    05/02/13  3:50 AM      Result Value Range   WBC 2.3 (*) 4.0 - 10.5 K/uL   RBC 2.78 (*) 4.22 - 5.81 MIL/uL   Hemoglobin 8.4 (*) 13.0 - 17.0 g/dL   HCT 47.8 (*) 29.5 - 62.1 %   MCV 84.5  78.0 - 100.0 fL   MCH 30.2  26.0 - 34.0 pg   MCHC 35.7  30.0 - 36.0 g/dL   RDW 30.8  65.7 - 84.6 %   Platelets 36 (*) 150 - 400 K/uL   Comment: DELTA CHECK NOTED     REPEATED TO VERIFY     SPECIMEN CHECKED FOR CLOTS   Neutrophils Relative % 64  43 - 77 %   Lymphocytes Relative 9 (*) 12 - 46 %   Monocytes Relative 24 (*) 3 - 12 %   Eosinophils Relative 2  0 - 5 %   Basophils Relative 1  0 - 1 %   Neutro Abs 1.5 (*) 1.7 - 7.7 K/uL   Lymphs Abs 0.2 (*) 0.7 - 4.0 K/uL   Monocytes Absolute 0.6  0.1 - 1.0 K/uL   Eosinophils Absolute 0.0  0.0 - 0.7 K/uL   Basophils Absolute 0.0  0.0 - 0.1 K/uL   WBC Morphology TOXIC GRANULATION     Comment: DOHLE BODIES  GLUCOSE, CAPILLARY  Status: Abnormal   Collection Time    05/02/13  8:29 AM      Result Value Range   Glucose-Capillary 191 (*) 70 - 99 mg/dL   Comment 1 Documented in Chart     Comment 2 Notify RN    GLUCOSE, CAPILLARY     Status: None   Collection Time    05/02/13 12:05 PM      Result Value Range   Glucose-Capillary 90  70 - 99 mg/dL   Comment 1 Documented in Chart     Comment 2 Notify RN    GLUCOSE, CAPILLARY     Status: Abnormal   Collection Time    05/02/13  5:06 PM      Result Value Range    Glucose-Capillary 150 (*) 70 - 99 mg/dL   Comment 1 Notify RN    GLUCOSE, CAPILLARY     Status: Abnormal   Collection Time    05/02/13  9:52 PM      Result Value Range   Glucose-Capillary 116 (*) 70 - 99 mg/dL   Comment 1 Documented in Chart     Comment 2 Notify RN    COMPREHENSIVE METABOLIC PANEL     Status: Abnormal   Collection Time    05/03/13  4:04 AM      Result Value Range   Sodium 136  135 - 145 mEq/L   Potassium 4.0  3.5 - 5.1 mEq/L   Comment: REPEATED TO VERIFY     DELTA CHECK NOTED     NO VISIBLE HEMOLYSIS   Chloride 100  96 - 112 mEq/L   CO2 25  19 - 32 mEq/L   Glucose, Bld 175 (*) 70 - 99 mg/dL   BUN 14  6 - 23 mg/dL   Creatinine, Ser 4.09 (*) 0.50 - 1.35 mg/dL   Calcium 9.5  8.4 - 81.1 mg/dL   Total Protein 6.3  6.0 - 8.3 g/dL   Albumin 3.1 (*) 3.5 - 5.2 g/dL   AST 15  0 - 37 U/L   ALT 15  0 - 53 U/L   Alkaline Phosphatase 97  39 - 117 U/L   Total Bilirubin 0.9  0.3 - 1.2 mg/dL   GFR calc non Af Amer 56 (*) >90 mL/min   GFR calc Af Amer 65 (*) >90 mL/min   Comment: (NOTE)     The eGFR has been calculated using the CKD EPI equation.     This calculation has not been validated in all clinical situations.     eGFR's persistently <90 mL/min signify possible Chronic Kidney     Disease.  CBC WITH DIFFERENTIAL     Status: Abnormal   Collection Time    05/03/13  4:04 AM      Result Value Range   WBC 7.8  4.0 - 10.5 K/uL   RBC 2.93 (*) 4.22 - 5.81 MIL/uL   Hemoglobin 9.1 (*) 13.0 - 17.0 g/dL   HCT 91.4 (*) 78.2 - 95.6 %   MCV 84.6  78.0 - 100.0 fL   MCH 31.1  26.0 - 34.0 pg   MCHC 36.7 (*) 30.0 - 36.0 g/dL   RDW 21.3  08.6 - 57.8 %   Platelets 32 (*) 150 - 400 K/uL   Comment: CONSISTENT WITH PREVIOUS RESULT   Neutrophils Relative % 74  43 - 77 %   Lymphocytes Relative 6 (*) 12 - 46 %   Monocytes Relative 18 (*) 3 - 12 %  Eosinophils Relative 1  0 - 5 %   Basophils Relative 1  0 - 1 %   Neutro Abs 5.7  1.7 - 7.7 K/uL   Lymphs Abs 0.5 (*) 0.7 - 4.0 K/uL    Monocytes Absolute 1.4 (*) 0.1 - 1.0 K/uL   Eosinophils Absolute 0.1  0.0 - 0.7 K/uL   Basophils Absolute 0.1  0.0 - 0.1 K/uL   RBC Morphology RARE NRBCs     WBC Morphology MILD LEFT SHIFT (1-5% METAS, OCC MYELO, OCC BANDS)     Comment: DOHLE BODIES  GLUCOSE, CAPILLARY     Status: Abnormal   Collection Time    05/03/13  8:03 AM      Result Value Range   Glucose-Capillary 147 (*) 70 - 99 mg/dL   Comment 1 Documented in Chart     Comment 2 Notify RN    GLUCOSE, CAPILLARY     Status: Abnormal   Collection Time    05/03/13 11:46 AM      Result Value Range   Glucose-Capillary 100 (*) 70 - 99 mg/dL   Comment 1 Documented in Chart     Comment 2 Notify RN    PREPARE PLATELET PHERESIS     Status: None   Collection Time    05/03/13 12:30 PM      Result Value Range   Unit Number [id number]     Blood Component Type PLTPHER LRI1     Unit division 00     Status of Unit ISSUED     Transfusion Status OK TO TRANSFUSE     Unit Number [id number]     Blood Component Type PLTPHER LR1     Unit division 00     Status of Unit ISSUED     Transfusion Status OK TO TRANSFUSE    GLUCOSE, CAPILLARY     Status: Abnormal   Collection Time    05/03/13  4:47 PM      Result Value Range   Glucose-Capillary 162 (*) 70 - 99 mg/dL   Comment 1 Documented in Chart     Comment 2 Notify RN    GLUCOSE, CAPILLARY     Status: None   Collection Time    05/03/13  9:31 PM      Result Value Range   Glucose-Capillary 85  70 - 99 mg/dL   Comment 1 Documented in Chart     Comment 2 Notify RN     Labs are reviewed.  Current Facility-Administered Medications  Medication Dose Route Frequency Provider Last Rate Last Dose  . acetaminophen (TYLENOL) tablet 650 mg  650 mg Oral Q6H PRN Jeralyn Bennett, MD   650 mg at 04/30/13 1307   Or  . acetaminophen (TYLENOL) suppository 650 mg  650 mg Rectal Q6H PRN Jeralyn Bennett, MD      . atorvastatin (LIPITOR) tablet 40 mg  40 mg Oral QHS Jeralyn Bennett, MD   40 mg at  05/03/13 2124  . diltiazem (CARDIZEM CD) 24 hr capsule 240 mg  240 mg Oral Daily Kathleene Hazel, MD   240 mg at 05/03/13 1142  . diphenhydrAMINE (BENADRYL) capsule 25 mg  25 mg Oral Q6H PRN Rolan Lipa, NP   25 mg at 05/03/13 0234  . diphenhydrAMINE-zinc acetate (BENADRYL) 2-0.1 % cream   Topical TID PRN Renae Fickle, MD      . feeding supplement (ENSURE COMPLETE) (ENSURE COMPLETE) liquid 237 mL  237 mL Oral TID WC & HS Lavena Bullion,  RD   237 mL at 05/03/13 1651  . fentaNYL (DURAGESIC - dosed mcg/hr) 50 mcg  50 mcg Transdermal Q72H Renae Fickle, MD   50 mcg at 05/01/13 2032  . haloperidol lactate (HALDOL) injection 5 mg  5 mg Intravenous Q6H PRN Renae Fickle, MD   5 mg at 05/03/13 2251  . HYDROcodone-acetaminophen (HYCET) 7.5-325 mg/15 ml solution 10 mL  10 mL Oral QID PRN Jeralyn Bennett, MD   10 mL at 05/03/13 0600  . insulin aspart (novoLOG) injection 0-15 Units  0-15 Units Subcutaneous TID WC Jeralyn Bennett, MD   3 Units at 05/03/13 1651  . insulin aspart (novoLOG) injection 0-5 Units  0-5 Units Subcutaneous QHS Jeralyn Bennett, MD   2 Units at 04/28/13 2129  . insulin detemir (LEVEMIR) injection 10 Units  10 Units Subcutaneous Daily Renae Fickle, MD   10 Units at 05/03/13 1336  . isosorbide mononitrate (IMDUR) 24 hr tablet 60 mg  60 mg Oral Daily Jeralyn Bennett, MD   60 mg at 05/03/13 1142  . levalbuterol (XOPENEX) nebulizer solution 1.25 mg  1.25 mg Nebulization Q6H PRN Renae Fickle, MD      . metoprolol tartrate (LOPRESSOR) tablet 25 mg  25 mg Oral BID Renae Fickle, MD   25 mg at 05/03/13 2133  . mometasone-formoterol (DULERA) 200-5 MCG/ACT inhaler 2 puff  2 puff Inhalation BID Renae Fickle, MD   2 puff at 05/03/13 2142  . morphine 2 MG/ML injection 2-4 mg  2-4 mg Intravenous Q4H PRN Rolan Lipa, NP   2 mg at 05/02/13 1610  . nitroGLYCERIN (NITROSTAT) SL tablet 0.4 mg  0.4 mg Sublingual Q5 min PRN Jeralyn Bennett, MD      . ondansetron  [facility]) tablet 4 mg  4 mg Oral Q6H PRN Jeralyn Bennett, MD   4 mg at 04/29/13 1650   Or  . ondansetron (ZOFRAN) injection 4 mg  4 mg Intravenous Q6H PRN Jeralyn Bennett, MD   4 mg at 04/29/13 0359  . pantoprazole (PROTONIX) EC tablet 40 mg  40 mg Oral BID Renae Fickle, MD   40 mg at 05/03/13 2124  . risperiDONE (RISPERDAL) tablet 1 mg  1 mg Oral BID Renae Fickle, MD   1 mg at 05/03/13 2138  . sodium chloride 0.9 % injection 3 mL  3 mL Intravenous Q12H Jeralyn Bennett, MD   3 mL at 05/03/13 2136  . sucralfate (CARAFATE) 1 GM/10ML suspension 1 g  1 g Oral TID WC & HS Renae Fickle, MD   1 g at 05/03/13 2138  . zolpidem (AMBIEN) tablet 10 mg  10 mg Oral QHS Renae Fickle, MD   10 mg at 05/03/13 2124    Psychiatric Specialty Exam:     Blood pressure 114/78, pulse 98, temperature 97.9 F (36.6 C), temperature source Oral, resp. rate 18, height 5\' 10"  (1.778 m), weight 225 lb (102.059 kg), SpO2 95.00%.Body mass index is 32.28 kg/(m^2).  General Appearance: Disheveled, Guarded and very anxious  Eye Contact::  Poor  Speech:  Slow  Volume:  Decreased  Mood:  Anxious, Depressed and Irritable  Affect:  Labile  Thought Process:  Disorganized, Irrelevant, Loose and Tangential  Orientation:  Full (Time, Place, and Person)  Thought Content:  Delusions, Hallucinations: talking to himself and Rumination  Suicidal Thoughts:  No  Homicidal Thoughts:  No  Memory:  Immediate;   Poor Recent;   Poor Remote;   Poor  Judgement:  Impaired  Insight:  Shallow  Psychomotor Activity:  Increased  Concentration:  Poor  Recall:  Poor  Akathisia:  restless  Handed:  Right  AIMS (if indicated):     Assets:  Housing  Sleep:      Treatment Plan Summary: Medication management Continue sitter for patient safety. Patient is very confused but some improvement with Risperdal, continue current dose if not medically contraindicated. Consider Zydis 5 mg po q 6 prn for agitation if benzo not working. Please  call [phone] if you have any further question.  Ryin Ambrosius T. 05/03/2013 11:33 PM

## 2013-05-04 ENCOUNTER — Other Ambulatory Visit: Payer: BC Managed Care – PPO

## 2013-05-04 ENCOUNTER — Encounter (HOSPITAL_COMMUNITY): Payer: Self-pay | Admitting: *Deleted

## 2013-05-04 ENCOUNTER — Encounter (HOSPITAL_COMMUNITY)
Admission: EM | Disposition: A | Discharge status: Home Health | Payer: Self-pay | Source: Home / Self Care | Attending: Internal Medicine

## 2013-05-04 DIAGNOSIS — J441 Chronic obstructive pulmonary disease with (acute) exacerbation: Secondary | ICD-10-CM

## 2013-05-04 DIAGNOSIS — K222 Esophageal obstruction: Secondary | ICD-10-CM

## 2013-05-04 DIAGNOSIS — F05 Delirium due to known physiological condition: Secondary | ICD-10-CM

## 2013-05-04 HISTORY — PX: ESOPHAGOGASTRODUODENOSCOPY: SHX5428

## 2013-05-04 LAB — CBC WITH DIFFERENTIAL/PLATELET
Basophils Relative: 1 % (ref 0–1)
Eosinophils Absolute: 0.1 10*3/uL (ref 0.0–0.7)
HCT: 23.5 % — ABNORMAL LOW (ref 39.0–52.0)
Hemoglobin: 8.2 g/dL — ABNORMAL LOW (ref 13.0–17.0)
Lymphocytes Relative: 5 % — ABNORMAL LOW (ref 12–46)
MCH: 30.4 pg (ref 26.0–34.0)
MCHC: 34.9 g/dL (ref 30.0–36.0)
MCV: 87 fL (ref 78.0–100.0)
Neutro Abs: 6.4 10*3/uL (ref 1.7–7.7)
RBC: 2.7 MIL/uL — ABNORMAL LOW (ref 4.22–5.81)

## 2013-05-04 LAB — PREPARE PLATELET PHERESIS: Unit division: 0

## 2013-05-04 LAB — GLUCOSE, CAPILLARY
Glucose-Capillary: 145 mg/dL — ABNORMAL HIGH (ref 70–99)
Glucose-Capillary: 185 mg/dL — ABNORMAL HIGH (ref 70–99)
Glucose-Capillary: 102 mg/dL — ABNORMAL HIGH (ref 70–99)

## 2013-05-04 LAB — COMPREHENSIVE METABOLIC PANEL
ALT: 15 U/L (ref 0–53)
AST: 16 U/L (ref 0–37)
Albumin: 3.1 g/dL — ABNORMAL LOW (ref 3.5–5.2)
BUN: 13 mg/dL (ref 6–23)
CO2: 25 mEq/L (ref 19–32)
Calcium: 9.4 mg/dL (ref 8.4–10.5)
GFR calc Af Amer: 68 mL/min — ABNORMAL LOW (ref >90)
GFR calc non Af Amer: 58 mL/min — ABNORMAL LOW (ref >90)
Sodium: 139 mEq/L (ref 135–145)
Total Protein: 6.3 g/dL (ref 6.0–8.3)

## 2013-05-04 SURGERY — EGD (ESOPHAGOGASTRODUODENOSCOPY)
Anesthesia: Moderate Sedation

## 2013-05-04 MED ORDER — DIPHENHYDRAMINE HCL 50 MG/ML IJ SOLN
INTRAMUSCULAR | Status: AC
  Filled 2013-05-04: qty 1

## 2013-05-04 MED ORDER — SODIUM CHLORIDE 0.9 % IV SOLN
INTRAVENOUS | Status: DC
  Administered 2013-05-04: 11:00:00 via INTRAVENOUS

## 2013-05-04 MED ORDER — FENTANYL CITRATE 0.05 MG/ML IJ SOLN
INTRAMUSCULAR | Status: AC
  Filled 2013-05-04: qty 2

## 2013-05-04 MED ORDER — LORAZEPAM 2 MG/ML IJ SOLN
1.0000 mg | Freq: Once | INTRAMUSCULAR | Status: AC
  Administered 2013-05-04: 1 mg via INTRAVENOUS
  Filled 2013-05-04: qty 1

## 2013-05-04 MED ORDER — MIDAZOLAM HCL 10 MG/2ML IJ SOLN
INTRAMUSCULAR | Status: AC
  Filled 2013-05-04: qty 2

## 2013-05-04 MED ORDER — OLANZAPINE 5 MG PO TBDP
5.0000 mg | ORAL_TABLET | Freq: Once | ORAL | Status: AC
  Administered 2013-05-04: 5 mg via ORAL
  Filled 2013-05-04: qty 1

## 2013-05-04 MED ORDER — BUTAMBEN-TETRACAINE-BENZOCAINE 2-2-14 % EX AERO
INHALATION_SPRAY | CUTANEOUS | Status: DC | PRN
  Administered 2013-05-04: 2 via TOPICAL

## 2013-05-04 MED ORDER — MIDAZOLAM HCL 10 MG/2ML IJ SOLN
INTRAMUSCULAR | Status: DC | PRN
  Administered 2013-05-04 (×2): 1 mg via INTRAVENOUS

## 2013-05-04 NOTE — Consult Note (Signed)
GI Attending Note   Chart was reviewed and patient was examined. X-rays and lab were reviewed.    I agree with management and plans.  Thu Baggett D. Franny Selvage, M.D., FACG [facility] Cell [phone]  

## 2013-05-04 NOTE — Progress Notes (Signed)
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  There is no evidence for tumor infiltration.  As long as patient can manage with a soft diet would not do anything further.

## 2013-05-04 NOTE — Progress Notes (Signed)
Clinical Social Work  CSW attempted to meet with patient but patient is out of room for procedure. CSW will follow up at later time.  [place], [place] [phone]

## 2013-05-04 NOTE — H&P (View-Only) (Signed)
Patient was seen and examined.  X-rays were reviewed.  Full note to follow. CT demonstrates a markedly thickened esophagus.  Changes are most likely inflammatory and related to x-ray therapy.  Infiltrating tumor cannot be excluded.  Endoscopic biopsies may be helpful but we need to defer EGD until platelet count exceeds 50,000.  In the interim I would add twice a day PPI therapy for maximal acid suppression, in view of severe odynoophagia. 

## 2013-05-04 NOTE — Interval H&P Note (Signed)
History and Physical Interval Note:  05/04/2013 12:02 PM  [name]  has presented today for surgery, with the diagnosis of Abnormal CT scan of the esophagus  The various methods of treatment have been discussed with the patient and family. After consideration of risks, benefits and other options for treatment, the patient has consented to  Procedure(s): ESOPHAGOGASTRODUODENOSCOPY (EGD) (N/A) as a surgical intervention .  The patient's history has been reviewed, patient examined, no change in status, stable for surgery.  I have reviewed the patient's chart and labs.  Questions were answered to the patient's satisfaction.     The recent H&P (dated **05/04/13*) was reviewed, the patient was examined and there is no change in the patients condition since that H&P was completed.   [name]  05/04/2013, 12:02 PM   [name]

## 2013-05-04 NOTE — Op Note (Signed)
[facility] [address] [place], [address]   ENDOSCOPY PROCEDURE REPORT  PATIENT: [name], [name]  MR#: [id number] BIRTHDATE: 06/21/1955 , 57  yrs. old GENDER: Male ENDOSCOPIST: Louis Meckel, MD REFERRED BY:  [name], M.D. PROCEDURE DATE:  05/04/2013 PROCEDURE:  EGD, diagnostic ASA CLASS:     Class III INDICATIONS:  narrowed esophagus, history of lung cancer, status post radiation therapy; rule out tumor infiltration. MEDICATIONS: These medications were titrated to patient response per physician's verbal order and Versed 2 mg IV TOPICAL ANESTHETIC: Cetacaine Spray  DESCRIPTION OF PROCEDURE: After the risks benefits and alternatives of the procedure were thoroughly explained, informed consent was obtained.  The Pentax Gastroscope [id number] endoscope was introduced through the mouth and advanced to the third portion of the duodenum. Without limitations.  The instrument was slowly withdrawn as the mucosa was fully examined.      Beginning in the proximal esophagus at approximately 25 cm and extending at least 8-10 cm there was extrinsic compression of the esophagus causing moderate stricturing.  Mucosa was red and erythematous but otherwise appeared normal.  Findings were consistent with radiation changes.  The 9 mm gastroscope traversed this area with resistance. There was a moderate stricture at the GE junction that was not obstructing.   Beginning in the proximal esophagus at approximately 25 cm and extending at least 8-10 cm there was extrinsic compression of the esophagus causing moderate stricturing.  Mucosa was red and erythematous but otherwise appeared normal.  Findings were consistent with radiation changes.  The 9 mm gastroscope traversed this area with resistance. There was a moderate stricture at the GE junction that was not obstructing.   The remainder of the upper endoscopy exam was otherwise normal.  Retroflexed views revealed no  abnormalities. The scope was then withdrawn from the patient and the procedure completed.  COMPLICATIONS: There were no complications. ENDOSCOPIC IMPRESSION: 1.  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  peptic stricture GE junction  RECOMMENDATIONS: soft diet PPI therapy REPEAT EXAM:  eSigned:  Louis Meckel, MD 05/04/2013 12:20 PM   XB:JYNWG Myna Hidalgo, MD and Chauncey Reading, MD  PATIENT NAME:  [name], [name] MR#: [id number]

## 2013-05-04 NOTE — Progress Notes (Signed)
Follow-up:  Received multiple calls this evening regarding pt's agitation, restlessness and intermittent combative behavior. There is a sitter at the bedside, but pt is difficult to calm or redirect. At this time pt is screaming out loudly, yelling for help, etc. Agitation has thus far been refractory to Haldol and an increased dose of Ambien has done little to help pt rest. At bedside pt is very restless, speaking incoherently and striking out at objects that are not there. RN reports he has not slept in > 24 hours. His skin is w/d and he does not appear to be in any respiratory distress.  VS have remained stable. Pt is restrained w/ a posey belt and bil soft wrists restraints as he will abruptly attempt to get OOB. Psychiatry was consulted 05/03/13 and recommended Zydis 5 mg PO q6h PRN agitation. Pt given Ativan 1mg  IV while awaiting Zydis. The Ativan did seem to calm pt and he was noted to be less agitated. Zydis 5 mg given ODT. Was at bedside approximately 15 minutes after Zydis given and pt, though occasionally talking to himself appears somewhat less agitated. Will continue to monitor closely. Assessment/Plan: 1. Psychosis/delirium: Etiology unclear. Follow up effectiveness of Zydis. Continue current restraints and sitter for safety. Will continue monitor closely.   Leanne Chang, NP-C [facility] Pager [phone]

## 2013-05-05 ENCOUNTER — Encounter (HOSPITAL_COMMUNITY): Payer: Self-pay | Admitting: Gastroenterology

## 2013-05-05 DIAGNOSIS — C3430 Malignant neoplasm of lower lobe, unspecified bronchus or lung: Secondary | ICD-10-CM

## 2013-05-05 DIAGNOSIS — D6481 Anemia due to antineoplastic chemotherapy: Secondary | ICD-10-CM

## 2013-05-05 DIAGNOSIS — D6959 Other secondary thrombocytopenia: Secondary | ICD-10-CM

## 2013-05-05 DIAGNOSIS — I10 Essential (primary) hypertension: Secondary | ICD-10-CM

## 2013-05-05 DIAGNOSIS — F29 Unspecified psychosis not due to a substance or known physiological condition: Secondary | ICD-10-CM

## 2013-05-05 LAB — GLUCOSE, CAPILLARY
Glucose-Capillary: 140 mg/dL — ABNORMAL HIGH (ref 70–99)
Glucose-Capillary: 303 mg/dL — ABNORMAL HIGH (ref 70–99)
Glucose-Capillary: 77 mg/dL (ref 70–99)

## 2013-05-05 LAB — COMPREHENSIVE METABOLIC PANEL
ALT: 14 U/L (ref 0–53)
AST: 17 U/L (ref 0–37)
Albumin: 2.9 g/dL — ABNORMAL LOW (ref 3.5–5.2)
Alkaline Phosphatase: 102 U/L (ref 39–117)
BUN: 16 mg/dL (ref 6–23)
Calcium: 9.2 mg/dL (ref 8.4–10.5)
Creatinine, Ser: 1.38 mg/dL — ABNORMAL HIGH (ref 0.50–1.35)
GFR calc non Af Amer: 55 mL/min — ABNORMAL LOW (ref >90)
Potassium: 3.5 mEq/L (ref 3.5–5.1)
Total Bilirubin: 0.6 mg/dL (ref 0.3–1.2)
Total Protein: 6.1 g/dL (ref 6.0–8.3)

## 2013-05-05 LAB — CBC WITH DIFFERENTIAL/PLATELET
Basophils Absolute: 0.1 10*3/uL (ref 0.0–0.1)
Basophils Relative: 1 % (ref 0–1)
Eosinophils Relative: 1 % (ref 0–5)
HCT: 23.6 % — ABNORMAL LOW (ref 39.0–52.0)
Hemoglobin: 8.2 g/dL — ABNORMAL LOW (ref 13.0–17.0)
Lymphs Abs: 0.3 10*3/uL — ABNORMAL LOW (ref 0.7–4.0)
MCV: 88.1 fL (ref 78.0–100.0)
Monocytes Relative: 19 % — ABNORMAL HIGH (ref 3–12)
Neutrophils Relative %: 74 % (ref 43–77)
Platelets: 70 10*3/uL — ABNORMAL LOW (ref 150–400)
RBC: 2.68 MIL/uL — ABNORMAL LOW (ref 4.22–5.81)
RDW: 15 % (ref 11.5–15.5)
WBC: 6.9 10*3/uL (ref 4.0–10.5)

## 2013-05-05 LAB — CULTURE, BLOOD (ROUTINE X 2)
Culture: NO GROWTH
Culture: NO GROWTH

## 2013-05-05 NOTE — Progress Notes (Signed)
[name]:[id number] DOB: [birth year] DOA: 04/28/2013 PCP: Clydell Hakim, MD  Late entry  Assessment/Plan  A-fib with RVR, resolved -  Continue diltiazem and metoprolol  -  Followup echocardiogram:  Mild LVH, EF 65%, could not exclude regional wall motion abnormalities, aortic and mitral valves appeared grossly normal, the right ventricle was poorly visualized.   -  TSH 0.971 wnl -  CT angio chest:  No evidence of PE  ICU delirium - improved, patient was still in restraints, discontinued - cont sitter for safety -  Last MRI brain was 11/3 and was negative for metastatic disease - Psych consult obtained  Febrile neutropenia with fever to 101 Fahrenheit, fevers resolved.  Neutropenia resolved.   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  Troponins negative, ECG remained stable.  -  CT scan with extensive thickening throughout the mid and distal esophagus likely due to esophagitis but cannot rule out neoplastic change - at the time of my exam, EGD pending   Hypertension, -  Continue Cardizem and metoprolol  Coronary artery disease with chest pain which is chronic. Chest pain is likely not due to ACS -  EKG without ischemic changes -  Troponins negative   Code Status: Full    HPI/Subjective:  Patient calm at the time of my exam and easily directed. Waiting for EGD. Sitter in room. Still had restraints but after exam, discontinued  Objective: Filed Vitals:   05/04/13 1229 05/04/13 2102 05/05/13  0626 05/05/13 1034  BP: 124/76 130/86 128/68   Pulse: 123 113 108   Temp: 98.1 F (36.7 C) 99.6 F (37.6 C) 98.2 F (36.8 C)   TempSrc: Oral Axillary Axillary   Resp: 24 24 20    Height:      Weight:      SpO2: 97% 95% 98% 90%    Intake/Output Summary (Last 24 hours) at 05/05/13 1241 Last data filed at 05/05/13 1106  Gross per 24 hour  Intake    480 ml  Output      0 ml  Net    480 ml   Filed Weights   04/28/13 2015 05/02/13 0400 05/02/13 1105  Weight: 96.9 kg (213 lb 10 oz) 96.2 kg (212 lb 1.3 oz) 102.059 kg (225 lb)    Exam:   General:   Alert and awake, NAD  CVS: RR, nl S1, S2 no mrg,  Abdomen:   NABS, soft, NT/ND  Chest: CTAB  MSK:   Normal tone and bulk, no LEE  Neuro:  Grossly intact    Data Reviewed: Basic Metabolic Panel:  Recent Labs Lab 05/01/13 0340 05/02/13 0350 05/03/13 0404 05/04/13 0421 05/05/13 0405  NA 134* 135 136 139 140  K 3.8 3.3* 4.0 3.4* 3.5  CL 99 97 100 102 103  CO2 26 26 25 25 26   GLUCOSE 111* 176* 175* 135* 142*  BUN 20 13 14 13 16   CREATININE 1.31 1.20 1.36* 1.32 1.38*  CALCIUM 9.2 9.0 9.5 9.4 9.2   Liver Function Tests:  Recent Labs Lab 05/01/13 0340 05/02/13 0350 05/03/13 0404 05/04/13 0421 05/05/13 0405  AST 14 12 15 16 17   ALT 15 14 15 15 14   ALKPHOS 83 88 97 100 102  BILITOT 3.2* 1.0 0.9 0.7 0.6  PROT 6.3 6.2 6.3 6.3 6.1  ALBUMIN 3.0* 2.9* 3.1* 3.1* 2.9*   No results found for this basename: LIPASE, AMYLASE,  in the last 168 hours No results found for this basename: AMMONIA,  in the last 168 hours CBC:  Recent Labs Lab 05/01/13 0340 05/02/13 0350 05/03/13 0404 05/04/13 0421 05/05/13 0405  WBC 0.5* 2.3* 7.8 8.3 6.9  NEUTROABS 0.3* 1.5* 5.7 6.4 5.1  HGB 8.4* 8.4* 9.1* 8.2* 8.2*  HCT 22.9* 23.5* 24.8* 23.5* 23.6*  MCV 83.9 84.5 84.6 87.0 88.1  PLT 57* 36* 32* 82* 70*   Cardiac Enzymes:  Recent Labs Lab 04/28/13 1430 04/28/13 1924 04/29/13 0050 04/29/13 1045  TROPONINI <0.30 <0.30 <0.30  <0.30   BNP (last 3 results)  Recent Labs  02/14/13 1953 04/28/13 1430  PROBNP 290.3* 678.8*   CBG:  Recent Labs Lab 05/04/13 1236 05/04/13 1601 05/04/13 2059 05/05/13 0746 05/05/13 1157  GLUCAP 145* 185* 102* 140* 303*    Recent Results (from the past 240 hour(s))  MRSA PCR SCREENING     Status: None   Collection Time    04/28/13  6:55 PM      Result Value Range Status   MRSA by PCR NEGATIVE  NEGATIVE Final   Comment:            The GeneXpert MRSA Assay (FDA     approved for NASAL specimens     only), is one component of a     comprehensive MRSA colonization     surveillance program. It is not     intended to diagnose MRSA     infection nor to guide or     monitor treatment for     MRSA infections.  CULTURE, BLOOD (ROUTINE X 2)     Status: None   Collection Time    04/29/13 12:50 AM      Result Value Range Status   Specimen Description BLOOD RIGHT ARM   Final   Special Requests BOTTLES DRAWN AEROBIC AND ANAEROBIC 5CC   Final   Culture  Setup Time     Final   Value: 04/29/2013 08:55     Performed at [facility]   Culture     Final   Value: NO GROWTH 5 DAYS     Performed at [facility]   Report Status 05/05/2013 FINAL   Final  CULTURE, BLOOD (ROUTINE X 2)     Status: None   Collection Time    04/29/13 12:55 AM      Result Value Range Status   Specimen Description BLOOD LEFT ARM   Final   Special Requests BOTTLES DRAWN AEROBIC AND ANAEROBIC 5CC   Final   Culture  Setup Time     Final   Value: 04/29/2013 08:55     Performed at [facility]   Culture     Final   Value: NO GROWTH 5 DAYS     Performed at [facility]   Report Status 05/05/2013 FINAL   Final     Studies: No results found.  Scheduled Meds: . atorvastatin  40 mg Oral QHS  . diltiazem  240 mg Oral Daily  . feeding supplement (ENSURE COMPLETE)  237 mL Oral TID WC &  HS  . fentaNYL  50 mcg Transdermal Q72H  . insulin aspart  0-15 Units Subcutaneous TID WC   . insulin aspart  0-5 Units Subcutaneous QHS  . insulin detemir  10 Units Subcutaneous Daily  . isosorbide mononitrate  60 mg Oral Daily  . metoprolol tartrate  25 mg Oral BID  . mometasone-formoterol  2 puff Inhalation BID  . pantoprazole  40 mg Oral BID  . risperiDONE  1 mg Oral BID  . sodium chloride  3 mL Intravenous Q12H  . sucralfate  1 g Oral TID WC & HS  . zolpidem  10 mg Oral QHS      Time spent: 30 min    Duyen Beckom  [facility] Pager [phone]. If 7PM-7AM, please contact night-coverage at [email or web address], password [facility] 05/05/2013, 12:41 PM  LOS: 7 days

## 2013-05-05 NOTE — Progress Notes (Signed)
[name]:[id number] DOB: [birth year] DOA: 04/28/2013 PCP: Clydell Hakim, MD   Assessment/Plan  A-fib with RVR, resolved -  Continue diltiazem and metoprolol  -  Followup echocardiogram:  Mild LVH, EF 65%, could not exclude regional wall motion abnormalities, aortic and mitral valves appeared grossly normal, the right ventricle was poorly visualized.   -  TSH 0.971 wnl -  CT angio chest:  No evidence of PE  ICU delirium -Resolved. -Patient alert and oriented x 3 today. -  Last MRI brain was 11/3 and was negative for metastatic disease - Psych consult obtained: continue risperdal. Will not need psych follow up  Febrile neutropenia with fever to 101 Fahrenheit, fevers resolved.  Neutropenia resolved.   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  Troponins negative, ECG remained stable.  -  CT scan with extensive thickening throughout the mid and distal esophagus likely due to esophagitis but cannot rule out neoplastic change - at the time of my exam, EGD pending   Hypertension, -  Continue Cardizem and metoprolol  Coronary artery disease with chest pain which is chronic. Chest pain is likely not due to ACS -  EKG without ischemic changes -  Troponins negative   Code Status: Full Disposition: Likely DC home in 24 hours.    HPI/Subjective:  No complaints. Eager to go home soon. Fully oriented today. Objective: Filed Vitals:   05/04/13 2102 05/05/13 0626 05/05/13 1034 05/05/13 1400  BP: 130/86  128/68  120/76  Pulse: 113 108  106  Temp: 99.6 F (37.6 C) 98.2 F (36.8 C)  98.2 F (36.8 C)  TempSrc: Axillary Axillary  Oral  Resp: 24 20  18   Height:      Weight:      SpO2: 95% 98% 90% 100%    Intake/Output Summary (Last 24 hours) at 05/05/13 1647 Last data filed at 05/05/13 1106  Gross per 24 hour  Intake    480 ml  Output      0 ml  Net    480 ml   Filed Weights   04/28/13 2015 05/02/13 0400 05/02/13 1105  Weight: 96.9 kg (213 lb 10 oz) 96.2 kg (212 lb 1.3 oz) 102.059 kg (225 lb)    Exam:   General:   Alert and awake, Ox3  CVS: RR, nl S1, S2 no mrg,  Abdomen:   NABS, soft, NT/ND  Chest: CTAB  MSK:   Normal tone and bulk, no LEE  Neuro:  Grossly intact    Data Reviewed: Basic Metabolic Panel:  Recent Labs Lab 05/01/13 0340 05/02/13 0350 05/03/13 0404 05/04/13 0421 05/05/13 0405  NA 134* 135 136 139 140  K 3.8 3.3* 4.0 3.4* 3.5  CL 99 97 100 102 103  CO2 26 26 25 25 26   GLUCOSE 111* 176* 175* 135* 142*  BUN 20 13 14 13 16   CREATININE 1.31 1.20 1.36* 1.32 1.38*  CALCIUM 9.2 9.0 9.5 9.4 9.2   Liver Function Tests:  Recent Labs Lab 05/01/13 0340 05/02/13 0350  05/03/13 0404 05/04/13 0421 05/05/13 0405  AST 14 12 15 16 17   ALT 15 14 15 15 14   ALKPHOS 83 88 97 100 102  BILITOT 3.2* 1.0 0.9 0.7 0.6  PROT 6.3 6.2 6.3 6.3 6.1  ALBUMIN 3.0* 2.9* 3.1* 3.1* 2.9*   No results found for this basename: LIPASE, AMYLASE,  in the last 168 hours No results found for this basename: AMMONIA,  in the last 168 hours CBC:  Recent Labs Lab 05/01/13 0340 05/02/13 0350 05/03/13 0404 05/04/13 0421 05/05/13 0405  WBC 0.5* 2.3* 7.8 8.3 6.9  NEUTROABS 0.3* 1.5* 5.7 6.4 5.1  HGB 8.4* 8.4* 9.1* 8.2* 8.2*  HCT 22.9* 23.5* 24.8* 23.5* 23.6*  MCV 83.9 84.5 84.6 87.0 88.1  PLT 57* 36* 32* 82* 70*   Cardiac Enzymes:  Recent Labs Lab 04/28/13 1924 04/29/13 0050 04/29/13 1045  TROPONINI <0.30 <0.30 <0.30   BNP (last 3 results)  Recent Labs   02/14/13 1953 04/28/13 1430  PROBNP 290.3* 678.8*   CBG:  Recent Labs Lab 05/04/13 1236 05/04/13 1601 05/04/13 2059 05/05/13 0746 05/05/13 1157  GLUCAP 145* 185* 102* 140* 303*    Recent Results (from the past 240 hour(s))  MRSA PCR SCREENING     Status: None   Collection Time    04/28/13  6:55 PM      Result Value Range Status   MRSA by PCR NEGATIVE  NEGATIVE Final   Comment:            The GeneXpert MRSA Assay (FDA     approved for NASAL specimens     only), is one component of a     comprehensive MRSA colonization     surveillance program. It is not     intended to diagnose MRSA     infection nor to guide or     monitor treatment for     MRSA infections.  CULTURE, BLOOD (ROUTINE X 2)     Status: None   Collection Time    04/29/13 12:50 AM      Result Value Range Status   Specimen Description BLOOD RIGHT ARM   Final   Special Requests BOTTLES DRAWN AEROBIC AND ANAEROBIC 5CC   Final   Culture  Setup Time     Final   Value: 04/29/2013 08:55     Performed at [facility]   Culture     Final   Value: NO GROWTH 5 DAYS     Performed at [facility]   Report Status 05/05/2013 FINAL   Final  CULTURE, BLOOD (ROUTINE X 2)     Status: None   Collection Time    04/29/13 12:55 AM      Result Value Range Status   Specimen Description BLOOD LEFT ARM   Final   Special Requests BOTTLES DRAWN AEROBIC AND ANAEROBIC 5CC   Final   Culture  Setup Time     Final   Value: 04/29/2013 08:55     Performed at [facility]   Culture     Final   Value: NO GROWTH 5 DAYS     Performed at [facility]   Report Status 05/05/2013 FINAL   Final     Studies: No results found.  Scheduled Meds: . atorvastatin  40 mg Oral QHS  . diltiazem  240 mg Oral Daily  . feeding supplement (ENSURE COMPLETE)  237 mL Oral TID WC & HS  . fentaNYL  50 mcg Transdermal Q72H  .  insulin aspart  0-15 Units Subcutaneous TID WC  . insulin aspart  0-5 Units Subcutaneous QHS   . insulin detemir  10 Units Subcutaneous Daily  . isosorbide mononitrate  60 mg Oral Daily  . metoprolol tartrate  25 mg Oral BID  . mometasone-formoterol  2 puff Inhalation BID  . pantoprazole  40 mg Oral BID  . risperiDONE  1 mg Oral BID  . sodium chloride  3 mL Intravenous Q12H  . sucralfate  1 g Oral TID WC & HS  . zolpidem  10 mg Oral QHS      Time spent: Greater than 50% spent in direct contact with patient.    [facility]  [facility] Pager [phone].  If 7PM-7AM, please contact night-coverage at [email or web address], password [email or web address] 05/05/2013, 4:47 PM  LOS: 7 days

## 2013-05-05 NOTE — Progress Notes (Signed)
Subjective: Mr. Renteria is feeling very well today with no complaints. He has no confusion this morning and was oriented to person, place and time. He cannot find his cell phone to contact his family and he does not remember their numbers. No other complaints today.  Objective: Vital signs in last 24 hours: Temp:  [97.8 F (36.6 C)-99.6 F (37.6 C)] 98.2 F (36.8 C) (12/25 0626) Pulse Rate:  [108-128] 108 (12/25 0626) Resp:  [20-45] 20 (12/25 0626) BP: (124-233)/(27-130) 128/68 mmHg (12/25 0626) SpO2:  [89 %-98 %] 98 % (12/25 0626)  Intake/Output from previous day: 12/24 0701 - 12/25 0700 In: 435 [P.O.:360; I.V.:75] Out: 600 [Urine:600] Intake/Output this shift:    General appearance: alert, cooperative and no distress Resp: clear to auscultation bilaterally Cardio: regular rate and rhythm, S1, S2 normal, no murmur, click, rub or gallop GI: soft, non-tender; bowel sounds normal; no masses,  no organomegaly Extremities: extremities normal, atraumatic, no cyanosis or edema  Lab Results:   Recent Labs  05/04/13 0421 05/05/13 0405  WBC 8.3 6.9  HGB 8.2* 8.2*  HCT 23.5* 23.6*  PLT 82* 70*   BMET  Recent Labs  05/04/13 0421 05/05/13 0405  NA 139 140  K 3.4* 3.5  CL 102 103  CO2 25 26  GLUCOSE 135* 142*  BUN 13 16  CREATININE 1.32 1.38*  CALCIUM 9.4 9.2    Studies/Results: No results found.  Medications: I have reviewed the patient's current medications.  Assessment/Plan: 1) limited stage small cell lung cancer: Status post 3 cycles of systemic chemotherapy with carboplatin and etoposide concurrent with radiation. He had significant improvement in his disease after cycle #2. We will resume his treatment with cycle #4 of improvement in his condition. The next cycle is scheduled for 05/10/2013.  2) chemotherapy-induced anemia: H&H stable. Will monitor. 3) chemotherapy-induced thrombocytopenia: improving slowly. Will monitor.  4) chemotherapy-induced neutropenia:  Resolved. 5) atrial fibrillation: Continue current treatment per cardiology. 6) Confusion: Etiology unclear, could be drug induced / lack of sleep but significantly improved today. 7) disposition: He would like to go home soon. Discharge planner to make sure he has good family support at home.   LOS: 7 days    Alin Hutchins K. 05/05/2013

## 2013-05-05 NOTE — Consult Note (Signed)
Psychiatry followup note    Assessment: AXIS I:  delirium AXIS II:  Deferred AXIS III:   Past Medical History  Diagnosis Date  . CAD (coronary artery disease)     a. PCI 3/08 to OM2 and mid CFX; b.  NSTEMI 6/10:  Prior stents patent, 90% dCFX,  mRCA occl (old) with collats; EF 55% on LV-gram => Xience DES 2.5 x 23 mm to distal CFX;  c. abnl MV 12/13 => LHC 04/23/12:  pLAD 50%, oOM1 80%, long stented segment of the CFX extending into the PLOM with the PLOM totally occluded ostially, CFX 70% ISR, mCFX 80% ISR, pRCA chronically occluded. Med Rx planned  . Diabetes mellitus type II   . Hyperlipidemia   . COPD (chronic obstructive pulmonary disease)     Quit tobacco 09/2009; Gold Stage 2 with asthma component - FEV1 1.53 L/54%, 14% fev1 BD response, DLCO 54% - July 2011; MM genotype 01/07/10; unable to afford spiriva and unwilling to try ics due to prior renal failure: state to Dr. Marchelle Gearing, Aug 2011; started on atrovent HFA fall 2011. no desturation on walk test Dec 2011  . CKD (chronic kidney disease)   . Obesity   . History of CVA (cerebrovascular accident) 2007    Without residual defecits  . 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 no further rx  . CHF (congestive heart failure)   . Myocardial infarction     x3  . Hypertension   . Asthma     as child  . Shortness of breath     constant/wears 2 liters  . Pneumonia     02/14/2013 in hospital  . Stroke     7 years- no problems   . Cancer     lung cancer-02/14/2013  . Blood transfusion without reported diagnosis    Subjective:   Patient seen and chart revived.  Patient is doing better.  His confusion is much improved.  He does not  remember his confusion.  He is on Risperdal and denies any shakes or any tremors.  He is anxious to be discharged but denies any agitation, anger or any mood swing.  He did not remember having any hallucination or delusions.  Denies any suicidal thoughts or homicidal thoughts.  There has been no recent incident for his agitation and aggressive behavior.  Past Psychiatric History: Past Medical History  Diagnosis Date  . CAD (coronary artery disease)     a. PCI 3/08 to OM2 and mid CFX; b.  NSTEMI 6/10:  Prior stents patent, 90% dCFX,  mRCA occl (old) with collats; EF 55% on LV-gram => Xience DES 2.5 x 23 mm to distal CFX;  c. abnl MV 12/13 => LHC 04/23/12:  pLAD 50%, oOM1 80%, long stented segment of the CFX extending into the PLOM with the PLOM totally occluded ostially, CFX 70% ISR, mCFX 80% ISR, pRCA chronically occluded. Med Rx planned  . Diabetes mellitus type II   . Hyperlipidemia   . COPD (chronic obstructive pulmonary disease)     Quit tobacco 09/2009; Gold Stage 2 with asthma component - FEV1 1.53 L/54%, 14% fev1 BD response, DLCO 54% - July 2011; MM genotype 01/07/10; unable to afford spiriva and  unwilling to try ics due to prior renal failure: state to Dr. Marchelle Gearing, Aug 2011; started on atrovent HFA fall 2011. no desturation on walk test Dec 2011  . CKD (chronic kidney disease)   . Obesity   . History of CVA (cerebrovascular accident) 2007    Without residual defecits  . 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 no further rx  . CHF (congestive heart failure)   . Myocardial infarction     x3  . Hypertension   . Asthma     as child  . Shortness of breath     constant/wears 2 liters  .  Pneumonia     02/14/2013 in hospital  . Stroke     7 years- no problems   . Cancer     lung cancer-02/14/2013  . Blood transfusion without reported diagnosis     reports that he quit smoking about 3 years ago. His smoking use included Cigarettes. He has a 40 pack-year smoking history. He does not have any smokeless tobacco history on file. He reports that he does not drink alcohol or use illicit drugs. Family History  Problem Relation Age of Onset  . Stroke Other   . Diabetes Mother   . Cancer Maternal Uncle      Living Arrangements: Alone   Abuse/Neglect [facility]) Physical Abuse: Denies Verbal Abuse: Denies Sexual Abuse: Denies Allergies:   Allergies  Allergen Reactions  . Erythromycin Other (See Comments)    Reaction while in hospital - pt doesn't know reaction  . Penicillins Other (See Comments)    Childhood reaction - unknown   Objective: Blood pressure 128/68, pulse 108, temperature 98.2 F (36.8 C), temperature source Axillary, resp. rate 20, height 5\' 10"  (1.778 m), weight 225 lb (102.059 kg), SpO2 90.00%.Body mass index is 32.28 kg/(m^2). Results for orders placed during the hospital encounter of 04/28/13 (from the past 72 hour(s))  GLUCOSE, CAPILLARY     Status: Abnormal   Collection Time    05/02/13  5:06 PM      Result Value Range   Glucose-Capillary 150 (*) 70 - 99 mg/dL   Comment 1 Notify RN    GLUCOSE, CAPILLARY     Status: Abnormal   Collection Time    05/02/13  9:52 PM      Result Value Range   Glucose-Capillary 116 (*) 70 - 99 mg/dL   Comment 1 Documented in Chart     Comment 2 Notify RN    COMPREHENSIVE METABOLIC PANEL     Status: Abnormal   Collection Time    05/03/13  4:04 AM      Result Value Range   Sodium 136  135 - 145 mEq/L   Potassium 4.0  3.5 - 5.1 mEq/L   Comment: REPEATED TO VERIFY     DELTA CHECK NOTED     NO VISIBLE HEMOLYSIS   Chloride 100  96 - 112 mEq/L   CO2 25  19 - 32 mEq/L   Glucose, Bld 175 (*) 70 - 99 mg/dL   BUN 14  6 -  23 mg/dL   Creatinine, Ser 8.29 (*) 0.50 - 1.35 mg/dL   Calcium 9.5  8.4 - 56.2 mg/dL  Total Protein 6.3  6.0 - 8.3 g/dL   Albumin 3.1 (*) 3.5 - 5.2 g/dL   AST 15  0 - 37 U/L   ALT 15  0 - 53 U/L   Alkaline Phosphatase 97  39 - 117 U/L   Total Bilirubin 0.9  0.3 - 1.2 mg/dL   GFR calc non Af Amer 56 (*) >90 mL/min   GFR calc Af Amer 65 (*) >90 mL/min   Comment: (NOTE)     The eGFR has been calculated using the CKD EPI equation.     This calculation has not been validated in all clinical situations.     eGFR's persistently <90 mL/min signify possible Chronic Kidney     Disease.  CBC WITH DIFFERENTIAL     Status: Abnormal   Collection Time    05/03/13  4:04 AM      Result Value Range   WBC 7.8  4.0 - 10.5 K/uL   RBC 2.93 (*) 4.22 - 5.81 MIL/uL   Hemoglobin 9.1 (*) 13.0 - 17.0 g/dL   HCT 29.5 (*) 28.4 - 13.2 %   MCV 84.6  78.0 - 100.0 fL   MCH 31.1  26.0 - 34.0 pg   MCHC 36.7 (*) 30.0 - 36.0 g/dL   RDW 44.0  10.2 - 72.5 %   Platelets 32 (*) 150 - 400 K/uL   Comment: CONSISTENT WITH PREVIOUS RESULT   Neutrophils Relative % 74  43 - 77 %   Lymphocytes Relative 6 (*) 12 - 46 %   Monocytes Relative 18 (*) 3 - 12 %   Eosinophils Relative 1  0 - 5 %   Basophils Relative 1  0 - 1 %   Neutro Abs 5.7  1.7 - 7.7 K/uL   Lymphs Abs 0.5 (*) 0.7 - 4.0 K/uL   Monocytes Absolute 1.4 (*) 0.1 - 1.0 K/uL   Eosinophils Absolute 0.1  0.0 - 0.7 K/uL   Basophils Absolute 0.1  0.0 - 0.1 K/uL   RBC Morphology RARE NRBCs     WBC Morphology MILD LEFT SHIFT (1-5% METAS, OCC MYELO, OCC BANDS)     Comment: DOHLE BODIES  GLUCOSE, CAPILLARY     Status: Abnormal   Collection Time    05/03/13  8:03 AM      Result Value Range   Glucose-Capillary 147 (*) 70 - 99 mg/dL   Comment 1 Documented in Chart     Comment 2 Notify RN    GLUCOSE, CAPILLARY     Status: Abnormal   Collection Time    05/03/13 11:46 AM      Result Value Range   Glucose-Capillary 100 (*) 70 - 99 mg/dL   Comment 1 Documented in Chart      Comment 2 Notify RN    PREPARE PLATELET PHERESIS     Status: None   Collection Time    05/03/13 12:30 PM      Result Value Range   Unit Number [id number]     Blood Component Type PLTPHER LRI1     Unit division 00     Status of Unit ISSUED,FINAL     Transfusion Status OK TO TRANSFUSE     Unit Number [id number]     Blood Component Type PLTPHER LR1     Unit division 00     Status of Unit ISSUED,FINAL     Transfusion Status OK TO TRANSFUSE    GLUCOSE, CAPILLARY     Status: Abnormal   Collection Time  05/03/13  4:47 PM      Result Value Range   Glucose-Capillary 162 (*) 70 - 99 mg/dL   Comment 1 Documented in Chart     Comment 2 Notify RN    GLUCOSE, CAPILLARY     Status: None   Collection Time    05/03/13  9:31 PM      Result Value Range   Glucose-Capillary 85  70 - 99 mg/dL   Comment 1 Documented in Chart     Comment 2 Notify RN    COMPREHENSIVE METABOLIC PANEL     Status: Abnormal   Collection Time    05/04/13  4:21 AM      Result Value Range   Sodium 139  135 - 145 mEq/L   Potassium 3.4 (*) 3.5 - 5.1 mEq/L   Chloride 102  96 - 112 mEq/L   CO2 25  19 - 32 mEq/L   Glucose, Bld 135 (*) 70 - 99 mg/dL   BUN 13  6 - 23 mg/dL   Creatinine, Ser 1.61  0.50 - 1.35 mg/dL   Calcium 9.4  8.4 - 09.6 mg/dL   Total Protein 6.3  6.0 - 8.3 g/dL   Albumin 3.1 (*) 3.5 - 5.2 g/dL   AST 16  0 - 37 U/L   ALT 15  0 - 53 U/L   Alkaline Phosphatase 100  39 - 117 U/L   Total Bilirubin 0.7  0.3 - 1.2 mg/dL   GFR calc non Af Amer 58 (*) >90 mL/min   GFR calc Af Amer 68 (*) >90 mL/min   Comment: (NOTE)     The eGFR has been calculated using the CKD EPI equation.     This calculation has not been validated in all clinical situations.     eGFR's persistently <90 mL/min signify possible Chronic Kidney     Disease.  CBC WITH DIFFERENTIAL     Status: Abnormal   Collection Time    05/04/13  4:21 AM      Result Value Range   WBC 8.3  4.0 - 10.5 K/uL   RBC 2.70 (*) 4.22 - 5.81 MIL/uL    Hemoglobin 8.2 (*) 13.0 - 17.0 g/dL   HCT 04.5 (*) 40.9 - 81.1 %   MCV 87.0  78.0 - 100.0 fL   MCH 30.4  26.0 - 34.0 pg   MCHC 34.9  30.0 - 36.0 g/dL   RDW 91.4  78.2 - 95.6 %   Platelets 82 (*) 150 - 400 K/uL   Comment: REPEATED TO VERIFY     DELTA CHECK NOTED     RESULT CHECKED   Neutrophils Relative % 77  43 - 77 %   Lymphocytes Relative 5 (*) 12 - 46 %   Monocytes Relative 16 (*) 3 - 12 %   Eosinophils Relative 1  0 - 5 %   Basophils Relative 1  0 - 1 %   Neutro Abs 6.4  1.7 - 7.7 K/uL   Lymphs Abs 0.4 (*) 0.7 - 4.0 K/uL   Monocytes Absolute 1.3 (*) 0.1 - 1.0 K/uL   Eosinophils Absolute 0.1  0.0 - 0.7 K/uL   Basophils Absolute 0.1  0.0 - 0.1 K/uL   WBC Morphology MILD LEFT SHIFT (1-5% METAS, OCC MYELO, OCC BANDS)     Comment: TOXIC GRANULATION  GLUCOSE, CAPILLARY     Status: Abnormal   Collection Time    05/04/13  7:47 AM      Result Value  Range   Glucose-Capillary 140 (*) 70 - 99 mg/dL   Comment 1 Documented in Chart     Comment 2 Notify RN    GLUCOSE, CAPILLARY     Status: Abnormal   Collection Time    05/04/13 12:36 PM      Result Value Range   Glucose-Capillary 145 (*) 70 - 99 mg/dL   Comment 1 Notify RN     Comment 2 Documented in Chart    GLUCOSE, CAPILLARY     Status: Abnormal   Collection Time    05/04/13  4:01 PM      Result Value Range   Glucose-Capillary 185 (*) 70 - 99 mg/dL  GLUCOSE, CAPILLARY     Status: Abnormal   Collection Time    05/04/13  8:59 PM      Result Value Range   Glucose-Capillary 102 (*) 70 - 99 mg/dL   Comment 1 Notify RN    COMPREHENSIVE METABOLIC PANEL     Status: Abnormal   Collection Time    05/05/13  4:05 AM      Result Value Range   Sodium 140  135 - 145 mEq/L   Potassium 3.5  3.5 - 5.1 mEq/L   Chloride 103  96 - 112 mEq/L   CO2 26  19 - 32 mEq/L   Glucose, Bld 142 (*) 70 - 99 mg/dL   BUN 16  6 - 23 mg/dL   Creatinine, Ser 1.61 (*) 0.50 - 1.35 mg/dL   Calcium 9.2  8.4 - 09.6 mg/dL   Total Protein 6.1  6.0 - 8.3 g/dL    Albumin 2.9 (*) 3.5 - 5.2 g/dL   AST 17  0 - 37 U/L   ALT 14  0 - 53 U/L   Alkaline Phosphatase 102  39 - 117 U/L   Total Bilirubin 0.6  0.3 - 1.2 mg/dL   GFR calc non Af Amer 55 (*) >90 mL/min   GFR calc Af Amer 64 (*) >90 mL/min   Comment: (NOTE)     The eGFR has been calculated using the CKD EPI equation.     This calculation has not been validated in all clinical situations.     eGFR's persistently <90 mL/min signify possible Chronic Kidney     Disease.  CBC WITH DIFFERENTIAL     Status: Abnormal   Collection Time    05/05/13  4:05 AM      Result Value Range   WBC 6.9  4.0 - 10.5 K/uL   RBC 2.68 (*) 4.22 - 5.81 MIL/uL   Hemoglobin 8.2 (*) 13.0 - 17.0 g/dL   HCT 04.5 (*) 40.9 - 81.1 %   MCV 88.1  78.0 - 100.0 fL   MCH 30.6  26.0 - 34.0 pg   MCHC 34.7  30.0 - 36.0 g/dL   RDW 91.4  78.2 - 95.6 %   Platelets 70 (*) 150 - 400 K/uL   Comment: CONSISTENT WITH PREVIOUS RESULT   Neutrophils Relative % 74  43 - 77 %   Lymphocytes Relative 5 (*) 12 - 46 %   Monocytes Relative 19 (*) 3 - 12 %   Eosinophils Relative 1  0 - 5 %   Basophils Relative 1  0 - 1 %   Neutro Abs 5.1  1.7 - 7.7 K/uL   Lymphs Abs 0.3 (*) 0.7 - 4.0 K/uL   Monocytes Absolute 1.3 (*) 0.1 - 1.0 K/uL   Eosinophils Absolute 0.1  0.0 - 0.7  K/uL   Basophils Absolute 0.1  0.0 - 0.1 K/uL   WBC Morphology MILD LEFT SHIFT (1-5% METAS, OCC MYELO, OCC BANDS)    GLUCOSE, CAPILLARY     Status: Abnormal   Collection Time    05/05/13  7:46 AM      Result Value Range   Glucose-Capillary 140 (*) 70 - 99 mg/dL  GLUCOSE, CAPILLARY     Status: Abnormal   Collection Time    05/05/13 11:57 AM      Result Value Range   Glucose-Capillary 303 (*) 70 - 99 mg/dL   Comment 1 Documented in Chart     Comment 2 Notify RN     Labs are reviewed.  Current Facility-Administered Medications  Medication Dose Route Frequency Provider Last Rate Last Dose  . acetaminophen (TYLENOL) tablet 650 mg  650 mg Oral Q6H PRN Jeralyn Bennett, MD    650 mg at 04/30/13 1307   Or  . acetaminophen (TYLENOL) suppository 650 mg  650 mg Rectal Q6H PRN Jeralyn Bennett, MD      . atorvastatin (LIPITOR) tablet 40 mg  40 mg Oral QHS Jeralyn Bennett, MD   40 mg at 05/04/13 2109  . diltiazem (CARDIZEM CD) 24 hr capsule 240 mg  240 mg Oral Daily Kathleene Hazel, MD   240 mg at 05/05/13 1013  . diphenhydrAMINE (BENADRYL) capsule 25 mg  25 mg Oral Q6H PRN Rolan Lipa, NP   25 mg at 05/03/13 0234  . diphenhydrAMINE-zinc acetate (BENADRYL) 2-0.1 % cream   Topical TID PRN Renae Fickle, MD      . feeding supplement (ENSURE COMPLETE) (ENSURE COMPLETE) liquid 237 mL  237 mL Oral TID WC & HS Lavena Bullion, RD   237 mL at 05/05/13 1307  . fentaNYL (DURAGESIC - dosed mcg/hr) 50 mcg  50 mcg Transdermal Q72H Renae Fickle, MD   50 mcg at 05/04/13 2110  . haloperidol lactate (HALDOL) injection 5 mg  5 mg Intravenous Q6H PRN Renae Fickle, MD   5 mg at 05/03/13 2251  . HYDROcodone-acetaminophen (HYCET) 7.5-325 mg/15 ml solution 10 mL  10 mL Oral QID PRN Jeralyn Bennett, MD   10 mL at 05/03/13 0600  . insulin aspart (novoLOG) injection 0-15 Units  0-15 Units Subcutaneous TID WC Jeralyn Bennett, MD   11 Units at 05/05/13 1306  . insulin aspart (novoLOG) injection 0-5 Units  0-5 Units Subcutaneous QHS Jeralyn Bennett, MD   2 Units at 04/28/13 2129  . insulin detemir (LEVEMIR) injection 10 Units  10 Units Subcutaneous Daily Renae Fickle, MD   10 Units at 05/05/13 1013  . isosorbide mononitrate (IMDUR) 24 hr tablet 60 mg  60 mg Oral Daily Jeralyn Bennett, MD   60 mg at 05/05/13 1013  . levalbuterol (XOPENEX) nebulizer solution 1.25 mg  1.25 mg Nebulization Q6H PRN Renae Fickle, MD   1.25 mg at 05/04/13 1040  . metoprolol tartrate (LOPRESSOR) tablet 25 mg  25 mg Oral BID Renae Fickle, MD   25 mg at 05/05/13 1013  . mometasone-formoterol (DULERA) 200-5 MCG/ACT inhaler 2 puff  2 puff Inhalation BID Renae Fickle, MD   2 puff at 05/05/13 1033   . morphine 2 MG/ML injection 2-4 mg  2-4 mg Intravenous Q4H PRN Rolan Lipa, NP   2 mg at 05/05/13 1013  . nitroGLYCERIN (NITROSTAT) SL tablet 0.4 mg  0.4 mg Sublingual Q5 min PRN Jeralyn Bennett, MD      . ondansetron [facility]) tablet 4 mg  4 mg Oral Q6H PRN Jeralyn Bennett, MD   4 mg at 04/29/13 1650   Or  . ondansetron (ZOFRAN) injection 4 mg  4 mg Intravenous Q6H PRN Jeralyn Bennett, MD   4 mg at 04/29/13 0359  . pantoprazole (PROTONIX) EC tablet 40 mg  40 mg Oral BID Renae Fickle, MD   40 mg at 05/05/13 1012  . risperiDONE (RISPERDAL) tablet 1 mg  1 mg Oral BID Renae Fickle, MD   1 mg at 05/05/13 1013  . sodium chloride 0.9 % injection 3 mL  3 mL Intravenous Q12H Jeralyn Bennett, MD   3 mL at 05/05/13 1000  . sucralfate (CARAFATE) 1 GM/10ML suspension 1 g  1 g Oral TID WC & HS Renae Fickle, MD   1 g at 05/05/13 1306  . zolpidem (AMBIEN) tablet 10 mg  10 mg Oral QHS Renae Fickle, MD   10 mg at 05/04/13 2109    Psychiatric Specialty Exam:     Blood pressure 128/68, pulse 108, temperature 98.2 F (36.8 C), temperature source Axillary, resp. rate 20, height 5\' 10"  (1.778 m), weight 225 lb (102.059 kg), SpO2 90.00%.Body mass index is 32.28 kg/(m^2).  General Appearance: Casual and anxious  Eye Contact::  Good  Speech:  Slow  Volume:  Decreased  Mood:  Anxious  Affect:  Labile  Thought Process:  Coherent and Intact  Orientation:  Full (Time, Place, and Person)  Thought Content:  Negative  Suicidal Thoughts:  No  Homicidal Thoughts:  No  Memory:  Immediate;   Fair Recent;   Fair Remote;   Fair  Judgement:  Fair  Insight:  Shallow  Psychomotor Activity:  Normal  Concentration:  Fair  Recall:  Fair  Akathisia:  No  Handed:  Right  AIMS (if indicated):     Assets:  Housing  Sleep:      Treatment Plan Summary: Patient is doing better on Risperdal.  His delirium is improving.  He is less confused less anxious.  He does not require inpatient psychiatric  services.  His medication can be managed outpatient by his primary care physician unless he requires to be seen by a psychiatrist.   Please call [phone] if you have any further question.  ARFEEN,SYED T. 05/05/2013 1:27 PM

## 2013-05-06 LAB — BASIC METABOLIC PANEL
BUN: 20 mg/dL (ref 6–23)
Calcium: 8.9 mg/dL (ref 8.4–10.5)
GFR calc non Af Amer: 62 mL/min — ABNORMAL LOW (ref >90)
Glucose, Bld: 157 mg/dL — ABNORMAL HIGH (ref 70–99)
Sodium: 135 mEq/L (ref 135–145)

## 2013-05-06 LAB — CBC
MCH: 30.5 pg (ref 26.0–34.0)
MCHC: 34.5 g/dL (ref 30.0–36.0)
Platelets: 58 10*3/uL — ABNORMAL LOW (ref 150–400)

## 2013-05-06 LAB — GLUCOSE, CAPILLARY: Glucose-Capillary: 165 mg/dL — ABNORMAL HIGH (ref 70–99)

## 2013-05-06 MED ORDER — RISPERIDONE 1 MG PO TABS: 1.0000 mg | ORAL_TABLET | Freq: Two times a day (BID) | ORAL | Status: DC

## 2013-05-06 MED ORDER — METOPROLOL TARTRATE 25 MG PO TABS: 25.0000 mg | ORAL_TABLET | Freq: Two times a day (BID) | ORAL | Status: DC

## 2013-05-06 MED ORDER — FENTANYL 25 MCG/HR TD PT72: 25.0000 ug | MEDICATED_PATCH | TRANSDERMAL | Status: DC

## 2013-05-06 MED ORDER — DILTIAZEM HCL ER COATED BEADS 240 MG PO CP24: 240.0000 mg | ORAL_CAPSULE | Freq: Every day | ORAL | Status: DC

## 2013-05-06 MED ORDER — LEVALBUTEROL HCL 0.63 MG/3ML IN NEBU
0.6300 mg | INHALATION_SOLUTION | Freq: Three times a day (TID) | RESPIRATORY_TRACT | Status: DC
Start: 2013-05-06 — End: 2013-05-06
  Administered 2013-05-06: 0.63 mg via RESPIRATORY_TRACT
  Filled 2013-05-06: qty 3

## 2013-05-06 MED ORDER — POTASSIUM CHLORIDE CRYS ER 20 MEQ PO TBCR
40.0000 meq | EXTENDED_RELEASE_TABLET | Freq: Once | ORAL | Status: AC
  Administered 2013-05-06: 40 meq via ORAL
  Filled 2013-05-06: qty 2

## 2013-05-06 MED ORDER — PANTOPRAZOLE SODIUM 40 MG PO TBEC: 40.0000 mg | DELAYED_RELEASE_TABLET | Freq: Two times a day (BID) | ORAL | Status: DC

## 2013-05-06 MED ORDER — LEVALBUTEROL HCL 0.63 MG/3ML IN NEBU: 0.6300 mg | INHALATION_SOLUTION | Freq: Three times a day (TID) | RESPIRATORY_TRACT | Status: DC | PRN

## 2013-05-06 NOTE — Discharge Summary (Addendum)
Physician Discharge Summary  [name]:[id number] DOB: 06-29-1955 DOA: 04/28/2013  PCP: [name]  Admit date: 04/28/2013 Discharge date: 05/06/2013  Time spent: 45 minutes  Recommendations for Outpatient Follow-up:  -Will be discharged home today. -Advised to follow up with PCP in 2 weeks.   Discharge Diagnoses:  Active Problems:   DM   HYPERLIPIDEMIA-MIXED   HYPERTENSION, UNSPECIFIED   CAD, NATIVE VESSEL   Small cell carcinoma of lung   Atrial fibrillation with RVR   A-fib   Febrile neutropenia   Pancytopenia due to chemotherapy   Delirium due to multiple etiologies   Protein-calorie malnutrition, severe   792.3   Abnormal x-ray GI tract   Nonspecific (abnormal) findings on radiological and other examination of gastrointestinal tract   Dysphagia, unspecified(787.20)   Stricture and stenosis of esophagus   Radiation esophagitis   Discharge Condition: Stable and improved  Filed Weights   04/28/13 2015 05/02/13 0400 05/02/13 1105  Weight: 96.9 kg (213 lb 10 oz) 96.2 kg (212 lb 1.3 oz) 102.059 kg (225 lb)    History of present illness:  [name] is a 57 y.o. 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 Today at the cancer Center he was found to have heart rates in the 130s to 140s with an EKG showing atrial fibrillation with rapid ventricular for response. It appears she was receiving a blood transfusion while this occurred. His oncologist Dr. Sofie Hartigan recommended that patient be transferred to the emergency department for further evaluation and treatment. On presentation a repeat EKG revealed atrial fibrillation with ventricular rate of 139. He was started on IV diltiazem in the emergency department where he spontaneously converted. Patient was seen and evaluated by Dr. Excell Seltzer of cardiology. He reports  severe left-sided chest pain characterized as constant, having an intensity of 8/10, which she attributes to chemoradiation therapy. Patient denies history of atrial fibrillation, palpitations, syncope or presyncope. Dr. Excell Seltzer recommended continuing diltiazem IV overnight with plans to transition to oral diltiazem in a.m. Hospitalist admission was requested.   Hospital Course:   A-fib with RVR, resolved  - Continue diltiazem and metoprolol  - Followup echocardiogram: Mild LVH, EF 65%, could not exclude regional wall motion abnormalities, aortic and mitral valves appeared grossly normal, the right ventricle was poorly visualized.  - TSH 0.971 wnl  - CT angio chest: No evidence of PE   ICU delirium  -Resolved.  -Patient alert and oriented x 3 today.  - Last MRI brain was 11/3 and was negative for metastatic disease  - Psych consult obtained: continue risperdal. Will not need psych follow up   Febrile neutropenia with fever to 101 Fahrenheit, fevers resolved. Neutropenia resolved.  - Blood cultures x2, UA neg, CXR unremarkable, Vanc and cefepime dc'ed  - Appreciate hematology and oncology recommendations   Pancytopenia due to chemotherapy, received Neulasta at cancer Center, no schistocytes on smear to suggest DIC, hgb stable, plt drifting down  - Receiving Neupogen  - Continue to hold aspirin, Plavix, and Lovenox due to low platelets and high risk of bleeding   Type 2 diabetes mellitus,  - Hold glipizide  - Continue sliding scale insulin   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 Troponins negative, ECG remained stable.  - CT scan with extensive thickening throughout the mid and distal esophagus likely due to esophagitis but cannot rule out neoplastic change  -  at the time of my exam, EGD pending   Hypertension,  - Continue Cardizem and metoprolol   Coronary artery disease with chest pain which is chronic. Chest pain is likely not due to ACS  - EKG without ischemic changes  -  Troponins negative    Procedures:  None   Consultations:  Cardiology  Oncology  Discharge Instructions  Discharge Orders   Future Appointments Provider Department Dept Phone   05/10/2013 1:45 PM [facility] [place] [facility] [phone]   05/10/2013 2:15 PM Marlana Salvage [facility] [phone]   05/10/2013 3:00 PM [facility] [place] [facility] [phone]   05/10/2013 3:15 PM Anabel Bene, RD [place] [facility] [phone]   05/11/2013 2:45 PM [facility] [place] [facility] [phone]   05/13/2013 2:00 PM [facility] [phone]   Future Orders Complete By Expires   Diet - low sodium heart healthy  As directed    Discontinue IV  As directed    Increase activity slowly  As directed        Medication List    STOP taking these medications       amLODipine 2.5 MG tablet  Commonly known as:  NORVASC     aspirin EC 81 MG tablet     bisoprolol 5 MG tablet  Commonly known as:  ZEBETA     clopidogrel 75 MG tablet  Commonly known as:  PLAVIX     glipiZIDE 10 MG tablet  Commonly known as:  GLUCOTROL     HYDROcodone-acetaminophen 7.5-325 mg/15 ml solution  Commonly known as:  HYCET     LORazepam 1 MG tablet  Commonly known as:  ATIVAN     losartan 50 MG tablet  Commonly known as:  COZAAR     moxifloxacin 400 MG tablet  Commonly known as:  AVELOX     ondansetron 4 MG disintegrating tablet  Commonly known as:  ZOFRAN ODT     prochlorperazine 10 MG tablet  Commonly known as:  COMPAZINE     promethazine 25 MG suppository  Commonly known as:  PHENERGAN     traMADol 50 MG tablet  Commonly known as:  ULTRAM      TAKE these medications       atorvastatin 40 MG tablet  Commonly known as:  LIPITOR  Take 40 mg by mouth at bedtime.     diltiazem 240 MG 24 hr  capsule  Commonly known as:  CARDIZEM CD  Take 1 capsule (240 mg total) by mouth daily.     fentaNYL 25 MCG/HR patch  Commonly known as:  DURAGESIC - dosed mcg/hr  Place 1 patch (25 mcg total) onto the skin every 3 (three) days.     Fluticasone Furoate-Vilanterol 100-25 MCG/INH Aepb  Commonly known as:  BREO ELLIPTA  Inhale 1 puff into the lungs 2 (two) times daily.     isosorbide mononitrate 60 MG 24 hr tablet  Commonly known as:  IMDUR  Take 1 tablet (60 mg total) by mouth daily.     magic mouthwash w/lidocaine Soln  Take 5 mLs by mouth 4 (four) times daily as needed for mouth pain.     metFORMIN 750 MG 24 hr tablet  Commonly known as:  GLUCOPHAGE-XR  Take 1,500 mg by mouth daily with breakfast.     metoprolol tartrate 25 MG tablet  Commonly  known as:  LOPRESSOR  Take 1 tablet (25 mg total) by mouth 2 (two) times daily.     nitroGLYCERIN 0.4 MG SL tablet  Commonly known as:  NITROSTAT  Place 0.4 mg under the tongue every 5 (five) minutes as needed for chest pain. May repeat up to 3 doses     pantoprazole 40 MG tablet  Commonly known as:  PROTONIX  Take 1 tablet (40 mg total) by mouth 2 (two) times daily.     albuterol (2.5 MG/3ML) 0.083% nebulizer solution  Commonly known as:  PROVENTIL  Take 3 mLs (2.5 mg total) by nebulization 5 (five) times daily as needed for wheezing or shortness of breath.     PROAIR HFA 108 (90 BASE) MCG/ACT inhaler  Generic drug:  albuterol  Inhale 2 puffs into the lungs every 6 (six) hours as needed for wheezing or shortness of breath.     risperiDONE 1 MG tablet  Commonly known as:  RISPERDAL  Take 1 tablet (1 mg total) by mouth 2 (two) times daily.     sucralfate 1 GM/10ML suspension  Commonly known as:  CARAFATE  Take 10 mLs (1 g total) by mouth 4 (four) times daily -  with meals and at bedtime.       Allergies  Allergen Reactions  . Erythromycin Other (See Comments)    Reaction while in hospital - pt doesn't know reaction  .  Penicillins Other (See Comments)    Childhood reaction - unknown       Follow-up Information   Follow up with [name]. Schedule an appointment as soon as possible for a visit in 2 weeks.   Specialty:  Family Medicine   Contact information:   [address] [place] [address] [phone]       Follow up with Lajuana Matte., Palmer. (as scheduled)    Specialty:  Oncology   Contact information:   [address] [place] [address] [phone]        The results of significant diagnostics from this hospitalization (including imaging, microbiology, ancillary and laboratory) are listed below for reference.    Significant Diagnostic Studies: Ct Head Wo Contrast  05/01/2013   CLINICAL DATA:  Increased confusion/delirium  EXAM: CT HEAD WITHOUT CONTRAST  TECHNIQUE: Contiguous axial images were obtained from the base of the skull through the vertex without intravenous contrast.  COMPARISON:  MRI brain dated 03/14/2013  FINDINGS: No evidence of parenchymal hemorrhage or extra-axial fluid collection. No mass lesion, mass effect, or midline shift.  No CT evidence of acute infarction.  Intracranial atherosclerosis.  Cerebral volume is within normal limits.  No ventriculomegaly.  The visualized paranasal sinuses are essentially clear. The mastoid air cells are unopacified.  No evidence of calvarial fracture.  IMPRESSION: No evidence of acute intracranial abnormality.   Electronically Signed   By: Charline Bills M.D.   On: 05/01/2013 10:48   Ct Chest W Contrast  04/18/2013   CLINICAL DATA:  Small cell lung cancer diagnosed 02/2013, chemotherapy and XRT in progress, chest pain, cough, shortness of breath  EXAM: CT CHEST WITH CONTRAST  TECHNIQUE: Multidetector CT imaging of the chest was performed during intravenous contrast administration.  CONTRAST:  80mL OMNIPAQUE IOHEXOL 300 MG/ML  SOLN  COMPARISON:  PET-CT dated 02/24/2013.  CT chest dated 02/14/2013.   FINDINGS: Radiation changes in the superior segment left lower lobe. Prior left lower lobe pulmonary nodule/mass has essentially resolved.  Underlying moderate to severe centrilobular and paraseptal  emphysematous changes. Postsurgical changes related to prior right lung wedge resection. No pleural effusion or pneumothorax.  Visualized thyroid is unremarkable.  The heart is normal in size. No pericardial effusion. Coronary atherosclerosis. Atherosclerotic calcifications of the aortic arch.  Improving thoracic lymphadenopathy, including:  --9 mm short axis prevascular node (series 2/ image 22), previously 17 mm  --14 mm AP window node (series 2/image 28), previously 26 mm  --10 mm short axis subcarinal node (series 2/ image 30), previously 12 mm  --12 mm short axis left hilar node (series 2/ image 32), previously 25 mm  Visualized upper abdomen is unremarkable.  Degenerative changes of the visualized thoracolumbar spine. Multiple right rib deformities related to prior thoracotomy.  IMPRESSION: Radiation changes in the superior segment left lower lobe. Postsurgical changes related to prior right lung wedge resection.  Prior left lower lobe pulmonary nodule/mass has essentially resolved.  Improving thoracic lymphadenopathy, as described above.   Electronically Signed   By: Charline Bills M.D.   On: 04/18/2013 09:37   Ct Angio Chest Pe W/cm &/or Wo Cm  05/02/2013   CLINICAL DATA:  Hypoxia history of lung carcinoma  EXAM: CT ANGIOGRAPHY CHEST WITH CONTRAST  TECHNIQUE: Multidetector CT imaging of the chest was performed using the standard protocol during bolus administration of intravenous contrast. Multiplanar CT image reconstructions including MIPs were obtained to evaluate the vascular anatomy.  CONTRAST:  OMNIPAQUE IOHEXOL 350 MG/ML SOLN  COMPARISON:  CT chest April 18, 2013; chest radiograph April 29, 2013  FINDINGS: There is no demonstrable pulmonary embolus. There is no thoracic aortic aneurysm or  dissection.  Underlying emphysematous change, moderately severe, is stable. There is scarring in the superior segment left lower lobe, probably due to post radiation therapy change and previous surgery. There is right mid lung atelectasis, stable. There is some fibrotic type change in the right base in an area of previous surgery.  There is extensive thickening of the wall of the mid to lower esophagus.  There is no new parenchymal lung lesion compared to recent prior study. There are several enlarged lymph nodes which appear stable compared to recent prior study. No new adenopathy is identified. Pericardium is not thickened.  In the upper abdomen, there is fatty change in the liver. Visualized upper abdominal structures otherwise appear unremarkable. There is degenerative change in the thoracic spine. There are no blastic or lytic bone lesions. Thyroid appears normal.  Review of the MIP images confirms the above findings.  IMPRESSION: There is a scarring and atelectatic change remain stable. There is extensive underlying emphysema. Several prominent lymph nodes remain stable compared to recent prior study; sizes of these lymph nodes were summarized in the most recent prior chest CT report.  No demonstrable pulmonary embolus.  Fatty liver.  Rather extensive thickening throughout portions of the mid and distal esophagus. These are changes that could be secondary to post chemotherapy or radiation therapy. They also may indicate esophagitis or potentially neoplastic change. The degree of thickening in this area is sufficient to warrant further evaluation with either barium swallow or direct visualization to further assess.   Electronically Signed   By: Bretta Bang M.D.   On: 05/02/2013 11:04   Dg Chest Port 1 View  04/29/2013   CLINICAL DATA:  Fever.  EXAM: PORTABLE CHEST - 1 VIEW  COMPARISON:  Chest CT 04/18/2013.  Chest x-ray 02/14/2013.  FINDINGS: Mediastinum and hilar structures are normal. Mild right  middle lobe atelectatic changes are present. No pleural effusion or  infiltrate. No mass lesion noted on today's examination. Previously identified left hilar mass is not identified. Heart size and pulmonary vascularity normal. No acute osseous abnormality.  IMPRESSION: 1. Previously identified left hilar mass no longer identified. 2. Mild right middle lobe atelectasis.   Electronically Signed   By: Maisie Fus  Register   On: 04/29/2013 07:18    Microbiology: Recent Results (from the past 240 hour(s))  MRSA PCR SCREENING     Status: None   Collection Time    04/28/13  6:55 PM      Result Value Range Status   MRSA by PCR NEGATIVE  NEGATIVE Final   Comment:            The GeneXpert MRSA Assay (FDA     approved for NASAL specimens     only), is one component of a     comprehensive MRSA colonization     surveillance program. It is not     intended to diagnose MRSA     infection nor to guide or     monitor treatment for     MRSA infections.  CULTURE, BLOOD (ROUTINE X 2)     Status: None   Collection Time    04/29/13 12:50 AM      Result Value Range Status   Specimen Description BLOOD RIGHT ARM   Final   Special Requests BOTTLES DRAWN AEROBIC AND ANAEROBIC 5CC   Final   Culture  Setup Time     Final   Value: 04/29/2013 08:55     Performed at [facility]   Culture     Final   Value: NO GROWTH 5 DAYS     Performed at [facility]   Report Status 05/05/2013 FINAL   Final  CULTURE, BLOOD (ROUTINE X 2)     Status: None   Collection Time    04/29/13 12:55 AM      Result Value Range Status   Specimen Description BLOOD LEFT ARM   Final   Special Requests BOTTLES DRAWN AEROBIC AND ANAEROBIC 5CC   Final   Culture  Setup Time     Final   Value: 04/29/2013 08:55     Performed at [facility]   Culture     Final   Value: NO GROWTH 5 DAYS     Performed at [facility]   Report Status 05/05/2013 FINAL   Final     Labs: Basic Metabolic Panel:  Recent Labs Lab  05/02/13 0350 05/03/13 0404 05/04/13 0421 05/05/13 0405 05/06/13 0407  NA 135 136 139 140 135  K 3.3* 4.0 3.4* 3.5 3.3*  CL 97 100 102 103 99  CO2 26 25 25 26 26   GLUCOSE 176* 175* 135* 142* 157*  BUN 13 14 13 16 20   CREATININE 1.20 1.36* 1.32 1.38* 1.25  CALCIUM 9.0 9.5 9.4 9.2 8.9   Liver Function Tests:  Recent Labs Lab 05/01/13 0340 05/02/13 0350 05/03/13 0404 05/04/13 0421 05/05/13 0405  AST 14 12 15 16 17   ALT 15 14 15 15 14   ALKPHOS 83 88 97 100 102  BILITOT 3.2* 1.0 0.9 0.7 0.6  PROT 6.3 6.2 6.3 6.3 6.1  ALBUMIN 3.0* 2.9* 3.1* 3.1* 2.9*   No results found for this basename: LIPASE, AMYLASE,  in the last 168 hours No results found for this basename: AMMONIA,  in the last 168 hours CBC:  Recent Labs Lab 05/01/13 0340 05/02/13 0350 05/03/13 0404 05/04/13 0421 05/05/13 0405 05/06/13  0407  WBC 0.5* 2.3* 7.8 8.3 6.9 6.5  NEUTROABS 0.3* 1.5* 5.7 6.4 5.1  --   HGB 8.4* 8.4* 9.1* 8.2* 8.2* 8.2*  HCT 22.9* 23.5* 24.8* 23.5* 23.6* 23.8*  MCV 83.9 84.5 84.6 87.0 88.1 88.5  PLT 57* 36* 32* 82* 70* 58*   Cardiac Enzymes: No results found for this basename: CKTOTAL, CKMB, CKMBINDEX, TROPONINI,  in the last 168 hours BNP: BNP (last 3 results)  Recent Labs  02/14/13 1953 04/28/13 1430  PROBNP 290.3* 678.8*   CBG:  Recent Labs Lab 05/05/13 0746 05/05/13 1157 05/05/13 1711 05/05/13 2101 05/06/13 0721  GLUCAP 140* 303* 77 169* 165*       Signed:  HERNANDEZ ACOSTA,ESTELA  [facility] Pager: [phone] 05/06/2013, 11:12 AM

## 2013-05-06 NOTE — Progress Notes (Signed)
I took over patient's care from [employer] at [employer].D/c instructions given, verbalized understanding. Denies pain.Hulda Marin RN

## 2013-05-06 NOTE — Progress Notes (Signed)
Patient complains that he has not been receiving breathing medications as ordered at home. He states he uses his nebulizer at least 2 times daily but up to four times daily when needed as well as using his rescue inhaler throughout the day. RT protocol assessment completed. Education provided. Orders changed accordingly.

## 2013-05-06 NOTE — Progress Notes (Signed)
Patient dc/ home.Alert and orientedx4. Stable.Hulda Marin RN

## 2013-05-06 NOTE — Care Management Note (Signed)
Cm spoke with patient at bedside with adult brother and sister present concerning discharge planning. Pt resides with a room mate. Per pt and brother retired neighbors frequently check on pt. Pt offered choice of HH services for medication management. Per pt choice [facility] to provide home health services. [facility] rep [employer] notified. Awaiting MD orders. Pt's sister to provide transportation home.    Roxy Manns Maddux [facility],MSN,RN [phone]

## 2013-05-08 ENCOUNTER — Other Ambulatory Visit: Payer: Self-pay | Admitting: Radiation Oncology

## 2013-05-08 ENCOUNTER — Other Ambulatory Visit: Payer: Self-pay | Admitting: Cardiology

## 2013-05-09 ENCOUNTER — Other Ambulatory Visit: Payer: Self-pay | Admitting: Radiation Oncology

## 2013-05-09 ENCOUNTER — Telehealth: Payer: Self-pay | Admitting: *Deleted

## 2013-05-09 ENCOUNTER — Telehealth: Payer: Self-pay

## 2013-05-09 NOTE — Telephone Encounter (Signed)
Pt's sister Elita Quick called stating that pt was just released from the hospital on 12/26 and it was adivsed he delay his f/u appt and chemo.  Per Dr Donnald Garre, okay to delay x 1 week.  Onc tx schedule filled out.  Pt's sister aware schedulers will call.  SLJ

## 2013-05-09 NOTE — Telephone Encounter (Signed)
Prescription refills for zofran and carafate faxed to [facility] [phone].

## 2013-05-10 ENCOUNTER — Telehealth: Payer: Self-pay | Admitting: Internal Medicine

## 2013-05-10 ENCOUNTER — Other Ambulatory Visit: Payer: BC Managed Care – PPO

## 2013-05-10 ENCOUNTER — Encounter: Payer: BC Managed Care – PPO | Admitting: Nutrition

## 2013-05-10 ENCOUNTER — Ambulatory Visit: Payer: BC Managed Care – PPO

## 2013-05-10 ENCOUNTER — Other Ambulatory Visit: Payer: Self-pay | Admitting: Radiation Oncology

## 2013-05-10 ENCOUNTER — Telehealth: Payer: Self-pay

## 2013-05-10 ENCOUNTER — Ambulatory Visit: Payer: BC Managed Care – PPO | Admitting: Physician Assistant

## 2013-05-10 ENCOUNTER — Telehealth: Payer: Self-pay | Admitting: *Deleted

## 2013-05-10 MED ORDER — HYDROCODONE-ACETAMINOPHEN 7.5-325 MG/15ML PO SOLN: 10.0000 mL | Freq: Four times a day (QID) | ORAL | Status: DC | PRN

## 2013-05-10 NOTE — Telephone Encounter (Signed)
Patient called stating he needs something for pain as it is difficult to swallow even his medications.Fentanyl added  to medication allergies as patient was hospitalized with delirium secondary to multiple etiologies.Electronic prescription for hycet ready for pick up by friend.Will schedule follow up with Dr.Wentworth in next 2 weeks.

## 2013-05-10 NOTE — Telephone Encounter (Signed)
lvm for pt and advised on Jan 2015 appt

## 2013-05-10 NOTE — Telephone Encounter (Signed)
Per staff message and POF I have scheduled appts.  JMW  

## 2013-05-11 ENCOUNTER — Ambulatory Visit: Payer: BC Managed Care – PPO | Admitting: Physician Assistant

## 2013-05-11 ENCOUNTER — Other Ambulatory Visit: Payer: BC Managed Care – PPO

## 2013-05-11 ENCOUNTER — Ambulatory Visit: Payer: BC Managed Care – PPO

## 2013-05-13 ENCOUNTER — Telehealth: Payer: Self-pay

## 2013-05-13 ENCOUNTER — Other Ambulatory Visit: Payer: Self-pay | Admitting: Radiation Oncology

## 2013-05-13 ENCOUNTER — Ambulatory Visit: Payer: BC Managed Care – PPO

## 2013-05-13 MED ORDER — HYDROCODONE-ACETAMINOPHEN 7.5-325 MG/15ML PO SOLN: 10.0000 mL | Freq: Four times a day (QID) | ORAL | Status: DC | PRN

## 2013-05-13 NOTE — Telephone Encounter (Signed)
Prescription refill for hycet is ready for pick-up.Spkoe with patient's sister and brother to pick up after 3:00 pm.Knows to bring id.appointment card for follow up on June 02, 1013 placed in envelope with prescription.

## 2013-05-17 ENCOUNTER — Ambulatory Visit (HOSPITAL_BASED_OUTPATIENT_CLINIC_OR_DEPARTMENT_OTHER): Payer: BC Managed Care – PPO

## 2013-05-17 ENCOUNTER — Other Ambulatory Visit: Payer: Self-pay | Admitting: Internal Medicine

## 2013-05-17 ENCOUNTER — Ambulatory Visit: Payer: BC Managed Care – PPO | Admitting: Nutrition

## 2013-05-17 ENCOUNTER — Other Ambulatory Visit: Payer: Self-pay | Admitting: Radiation Oncology

## 2013-05-17 ENCOUNTER — Other Ambulatory Visit (HOSPITAL_BASED_OUTPATIENT_CLINIC_OR_DEPARTMENT_OTHER): Payer: BC Managed Care – PPO

## 2013-05-17 DIAGNOSIS — C3430 Malignant neoplasm of lower lobe, unspecified bronchus or lung: Secondary | ICD-10-CM

## 2013-05-17 DIAGNOSIS — C3492 Malignant neoplasm of unspecified part of left bronchus or lung: Secondary | ICD-10-CM

## 2013-05-17 DIAGNOSIS — Z5111 Encounter for antineoplastic chemotherapy: Secondary | ICD-10-CM

## 2013-05-17 LAB — COMPREHENSIVE METABOLIC PANEL (CC13)
ALBUMIN: 3.5 g/dL (ref 3.5–5.0)
ALK PHOS: 118 U/L (ref 40–150)
ALT: 18 U/L (ref 0–55)
AST: 14 U/L (ref 5–34)
Anion Gap: 11 mEq/L (ref 3–11)
BUN: 19.2 mg/dL (ref 7.0–26.0)
CALCIUM: 9.6 mg/dL (ref 8.4–10.4)
CO2: 26 mEq/L (ref 22–29)
CREATININE: 1.6 mg/dL — AB (ref 0.7–1.3)
Chloride: 100 mEq/L (ref 98–109)
GLUCOSE: 245 mg/dL — AB (ref 70–140)
POTASSIUM: 4.2 meq/L (ref 3.5–5.1)
Sodium: 136 mEq/L (ref 136–145)
Total Bilirubin: 0.5 mg/dL (ref 0.20–1.20)
Total Protein: 7.3 g/dL (ref 6.4–8.3)

## 2013-05-17 LAB — CBC WITH DIFFERENTIAL/PLATELET
BASO%: 0.9 % (ref 0.0–2.0)
BASOS ABS: 0 10*3/uL (ref 0.0–0.1)
EOS ABS: 0 10*3/uL (ref 0.0–0.5)
EOS%: 0.2 % (ref 0.0–7.0)
HCT: 28.2 % — ABNORMAL LOW (ref 38.4–49.9)
HGB: 9.5 g/dL — ABNORMAL LOW (ref 13.0–17.1)
LYMPH%: 17.2 % (ref 14.0–49.0)
MCH: 30.8 pg (ref 27.2–33.4)
MCHC: 33.7 g/dL (ref 32.0–36.0)
MCV: 91.6 fL (ref 79.3–98.0)
MONO#: 0.5 10*3/uL (ref 0.1–0.9)
MONO%: 10.4 % (ref 0.0–14.0)
NEUT%: 71.3 % (ref 39.0–75.0)
NEUTROS ABS: 3.1 10*3/uL (ref 1.5–6.5)
PLATELETS: 224 10*3/uL (ref 140–400)
RBC: 3.08 10*6/uL — ABNORMAL LOW (ref 4.20–5.82)
RDW: 20.7 % — AB (ref 11.0–14.6)
WBC: 4.4 10*3/uL (ref 4.0–10.3)
lymph#: 0.8 10*3/uL — ABNORMAL LOW (ref 0.9–3.3)

## 2013-05-17 MED ORDER — SODIUM CHLORIDE 0.9 % IV SOLN
100.0000 mg/m2 | Freq: Once | INTRAVENOUS | Status: AC
  Administered 2013-05-17: 230 mg via INTRAVENOUS
  Filled 2013-05-17: qty 11.5

## 2013-05-17 MED ORDER — DEXAMETHASONE SODIUM PHOSPHATE 20 MG/5ML IJ SOLN
INTRAMUSCULAR | Status: AC
  Filled 2013-05-17: qty 5

## 2013-05-17 MED ORDER — HYDROCODONE-ACETAMINOPHEN 7.5-325 MG/15ML PO SOLN: 10.0000 mL | Freq: Four times a day (QID) | ORAL | Status: DC | PRN

## 2013-05-17 MED ORDER — ONDANSETRON 16 MG/50ML IVPB (CHCC)
INTRAVENOUS | Status: AC
  Filled 2013-05-17: qty 16

## 2013-05-17 MED ORDER — SODIUM CHLORIDE 0.9 % IV SOLN
Freq: Once | INTRAVENOUS | Status: AC
  Administered 2013-05-17: 14:00:00 via INTRAVENOUS

## 2013-05-17 MED ORDER — DEXAMETHASONE SODIUM PHOSPHATE 20 MG/5ML IJ SOLN
20.0000 mg | Freq: Once | INTRAMUSCULAR | Status: AC
  Administered 2013-05-17: 20 mg via INTRAVENOUS

## 2013-05-17 MED ORDER — ONDANSETRON 16 MG/50ML IVPB (CHCC)
16.0000 mg | Freq: Once | INTRAVENOUS | Status: AC
  Administered 2013-05-17: 16 mg via INTRAVENOUS

## 2013-05-17 MED ORDER — SODIUM CHLORIDE 0.9 % IV SOLN
550.8000 mg | Freq: Once | INTRAVENOUS | Status: AC
  Administered 2013-05-17: 550 mg via INTRAVENOUS
  Filled 2013-05-17: qty 55

## 2013-05-17 NOTE — Patient Instructions (Signed)
[facility] Discharge Instructions for Patients Receiving Chemotherapy  Today you received the following chemotherapy agents Carboplatin, Etoposide.   To help prevent nausea and vomiting after your treatment, we encourage you to take your nausea medication as prescribed.     If you develop nausea and vomiting that is not controlled by your nausea medication, call the clinic.   BELOW ARE SYMPTOMS THAT SHOULD BE REPORTED IMMEDIATELY:  *FEVER GREATER THAN 100.5 F  *CHILLS WITH OR WITHOUT FEVER  NAUSEA AND VOMITING THAT IS NOT CONTROLLED WITH YOUR NAUSEA MEDICATION  *UNUSUAL SHORTNESS OF BREATH  *UNUSUAL BRUISING OR BLEEDING  TENDERNESS IN MOUTH AND THROAT WITH OR WITHOUT PRESENCE OF ULCERS  *URINARY PROBLEMS  *BOWEL PROBLEMS  UNUSUAL RASH Items with * indicate a potential emergency and should be followed up as soon as possible.  Feel free to call the clinic should you have any questions or concerns. The clinic phone number is [phone].  It was my pleasure to take care of you today!  Leeanne Rio, RN

## 2013-05-17 NOTE — Progress Notes (Signed)
Patient continues to have difficulty swallowing however, he states it has improved.  He denies using pain medication at this time.  Nausea has resolved.  Patient reports good appetite, but difficulty eating.  Weight stable and was documented as 225 pounds on December 22.  Nutrition diagnosis: Food and nutrition related knowledge deficit improved.  Intervention: Patient educated to continue to consume soft, moist foods.  I have recommended patient began using his Nutri-bullet to make foods softer, and easier to swallow.  I have reviewed appropriate snacks for patient.  Patient verbalized understanding.  Monitoring, evaluation, goals: Patient continues to work to increase oral intake.  His weight has stabilized.  Next visit: Tuesday, January 27, during chemotherapy.

## 2013-05-18 ENCOUNTER — Ambulatory Visit (HOSPITAL_BASED_OUTPATIENT_CLINIC_OR_DEPARTMENT_OTHER): Payer: BC Managed Care – PPO

## 2013-05-18 VITALS — BP 129/80 | HR 106 | Temp 97.8°F | Resp 18

## 2013-05-18 DIAGNOSIS — C3492 Malignant neoplasm of unspecified part of left bronchus or lung: Secondary | ICD-10-CM

## 2013-05-18 DIAGNOSIS — C3430 Malignant neoplasm of lower lobe, unspecified bronchus or lung: Secondary | ICD-10-CM

## 2013-05-18 DIAGNOSIS — Z5111 Encounter for antineoplastic chemotherapy: Secondary | ICD-10-CM

## 2013-05-18 MED ORDER — SODIUM CHLORIDE 0.9 % IV SOLN
Freq: Once | INTRAVENOUS | Status: AC
Start: 2013-05-18 — End: 2013-05-18
  Administered 2013-05-18: 12:00:00 via INTRAVENOUS

## 2013-05-18 MED ORDER — ONDANSETRON 8 MG/NS 50 ML IVPB
INTRAVENOUS | Status: AC
  Filled 2013-05-18: qty 8

## 2013-05-18 MED ORDER — DEXAMETHASONE SODIUM PHOSPHATE 10 MG/ML IJ SOLN
10.0000 mg | Freq: Once | INTRAMUSCULAR | Status: AC
  Administered 2013-05-18: 10 mg via INTRAVENOUS

## 2013-05-18 MED ORDER — ONDANSETRON 8 MG/50ML IVPB (CHCC)
8.0000 mg | Freq: Once | INTRAVENOUS | Status: AC
  Administered 2013-05-18: 8 mg via INTRAVENOUS

## 2013-05-18 MED ORDER — ETOPOSIDE CHEMO INJECTION 1 GM/50ML
100.0000 mg/m2 | Freq: Once | INTRAVENOUS | Status: AC
  Administered 2013-05-18: 230 mg via INTRAVENOUS
  Filled 2013-05-18: qty 11.5

## 2013-05-18 MED ORDER — DEXAMETHASONE SODIUM PHOSPHATE 10 MG/ML IJ SOLN
INTRAMUSCULAR | Status: AC
  Filled 2013-05-18: qty 1

## 2013-05-19 ENCOUNTER — Ambulatory Visit (HOSPITAL_BASED_OUTPATIENT_CLINIC_OR_DEPARTMENT_OTHER): Payer: BC Managed Care – PPO

## 2013-05-19 VITALS — BP 94/62 | HR 107 | Temp 97.4°F | Resp 20

## 2013-05-19 DIAGNOSIS — C3430 Malignant neoplasm of lower lobe, unspecified bronchus or lung: Secondary | ICD-10-CM

## 2013-05-19 DIAGNOSIS — C3492 Malignant neoplasm of unspecified part of left bronchus or lung: Secondary | ICD-10-CM

## 2013-05-19 DIAGNOSIS — Z5111 Encounter for antineoplastic chemotherapy: Secondary | ICD-10-CM

## 2013-05-19 MED ORDER — SODIUM CHLORIDE 0.9 % IV SOLN
Freq: Once | INTRAVENOUS | Status: AC
  Administered 2013-05-19: 15:00:00 via INTRAVENOUS

## 2013-05-19 MED ORDER — ONDANSETRON 8 MG/50ML IVPB (CHCC)
8.0000 mg | Freq: Once | INTRAVENOUS | Status: AC
  Administered 2013-05-19: 8 mg via INTRAVENOUS

## 2013-05-19 MED ORDER — DEXAMETHASONE SODIUM PHOSPHATE 10 MG/ML IJ SOLN
INTRAMUSCULAR | Status: AC
  Filled 2013-05-19: qty 1

## 2013-05-19 MED ORDER — DEXAMETHASONE SODIUM PHOSPHATE 10 MG/ML IJ SOLN
10.0000 mg | Freq: Once | INTRAMUSCULAR | Status: AC
  Administered 2013-05-19: 10 mg via INTRAVENOUS

## 2013-05-19 MED ORDER — ONDANSETRON 8 MG/NS 50 ML IVPB
INTRAVENOUS | Status: AC
  Filled 2013-05-19: qty 8

## 2013-05-19 MED ORDER — SODIUM CHLORIDE 0.9 % IV SOLN
100.0000 mg/m2 | Freq: Once | INTRAVENOUS | Status: AC
  Administered 2013-05-19: 230 mg via INTRAVENOUS
  Filled 2013-05-19: qty 11.5

## 2013-05-19 NOTE — Patient Instructions (Signed)
[facility] Discharge Instructions for Patients Receiving Chemotherapy  Today you received the following chemotherapy agent: Etoposide   To help prevent nausea and vomiting after your treatment, we encourage you to take your nausea medication as prescribed.   If you develop nausea and vomiting that is not controlled by your nausea medication, call the clinic.   BELOW ARE SYMPTOMS THAT SHOULD BE REPORTED IMMEDIATELY:  *FEVER GREATER THAN 100.5 F  *CHILLS WITH OR WITHOUT FEVER  NAUSEA AND VOMITING THAT IS NOT CONTROLLED WITH YOUR NAUSEA MEDICATION  *UNUSUAL SHORTNESS OF BREATH  *UNUSUAL BRUISING OR BLEEDING  TENDERNESS IN MOUTH AND THROAT WITH OR WITHOUT PRESENCE OF ULCERS  *URINARY PROBLEMS  *BOWEL PROBLEMS  UNUSUAL RASH Items with * indicate a potential emergency and should be followed up as soon as possible.  Feel free to call the clinic you have any questions or concerns. The clinic phone number is [phone].

## 2013-05-20 ENCOUNTER — Ambulatory Visit (HOSPITAL_BASED_OUTPATIENT_CLINIC_OR_DEPARTMENT_OTHER): Payer: BC Managed Care – PPO

## 2013-05-20 ENCOUNTER — Other Ambulatory Visit: Payer: Self-pay | Admitting: Radiation Oncology

## 2013-05-20 VITALS — BP 104/68 | HR 110 | Temp 98.0°F

## 2013-05-20 DIAGNOSIS — Z5189 Encounter for other specified aftercare: Secondary | ICD-10-CM

## 2013-05-20 DIAGNOSIS — C3430 Malignant neoplasm of lower lobe, unspecified bronchus or lung: Secondary | ICD-10-CM

## 2013-05-20 DIAGNOSIS — C3492 Malignant neoplasm of unspecified part of left bronchus or lung: Secondary | ICD-10-CM

## 2013-05-20 MED ORDER — HYDROCODONE-ACETAMINOPHEN 7.5-325 MG/15ML PO SOLN: 10.0000 mL | Freq: Four times a day (QID) | ORAL | Status: DC | PRN

## 2013-05-20 MED ORDER — PEGFILGRASTIM INJECTION 6 MG/0.6ML
6.0000 mg | Freq: Once | SUBCUTANEOUS | Status: AC
  Administered 2013-05-20: 6 mg via SUBCUTANEOUS
  Filled 2013-05-20: qty 0.6

## 2013-05-20 MED ORDER — MAGIC MOUTHWASH W/LIDOCAINE: 5.0000 mL | Freq: Four times a day (QID) | ORAL | Status: DC | PRN

## 2013-05-24 ENCOUNTER — Other Ambulatory Visit (HOSPITAL_BASED_OUTPATIENT_CLINIC_OR_DEPARTMENT_OTHER): Payer: BC Managed Care – PPO

## 2013-05-24 ENCOUNTER — Ambulatory Visit (HOSPITAL_BASED_OUTPATIENT_CLINIC_OR_DEPARTMENT_OTHER): Payer: BC Managed Care – PPO | Admitting: Physician Assistant

## 2013-05-24 ENCOUNTER — Telehealth: Payer: Self-pay | Admitting: Internal Medicine

## 2013-05-24 ENCOUNTER — Encounter: Payer: Self-pay | Admitting: Physician Assistant

## 2013-05-24 VITALS — BP 107/73 | HR 109 | Temp 98.1°F | Ht 70.0 in | Wt 204.8 lb

## 2013-05-24 DIAGNOSIS — C3430 Malignant neoplasm of lower lobe, unspecified bronchus or lung: Secondary | ICD-10-CM

## 2013-05-24 DIAGNOSIS — C3492 Malignant neoplasm of unspecified part of left bronchus or lung: Secondary | ICD-10-CM

## 2013-05-24 DIAGNOSIS — C3490 Malignant neoplasm of unspecified part of unspecified bronchus or lung: Secondary | ICD-10-CM

## 2013-05-24 LAB — CBC WITH DIFFERENTIAL/PLATELET
BASO%: 1.6 % (ref 0.0–2.0)
Basophils Absolute: 0 10*3/uL (ref 0.0–0.1)
EOS ABS: 0 10*3/uL (ref 0.0–0.5)
EOS%: 1.8 % (ref 0.0–7.0)
HCT: 26.3 % — ABNORMAL LOW (ref 38.4–49.9)
HEMOGLOBIN: 9 g/dL — AB (ref 13.0–17.1)
LYMPH%: 17.2 % (ref 14.0–49.0)
MCH: 32.2 pg (ref 27.2–33.4)
MCHC: 34.4 g/dL (ref 32.0–36.0)
MCV: 93.7 fL (ref 79.3–98.0)
MONO#: 0.1 10*3/uL (ref 0.1–0.9)
MONO%: 3.8 % (ref 0.0–14.0)
NEUT%: 75.6 % — ABNORMAL HIGH (ref 39.0–75.0)
NEUTROS ABS: 1 10*3/uL — AB (ref 1.5–6.5)
PLATELETS: 61 10*3/uL — AB (ref 140–400)
RBC: 2.81 10*6/uL — ABNORMAL LOW (ref 4.20–5.82)
RDW: 21.6 % — AB (ref 11.0–14.6)
WBC: 1.4 10*3/uL — AB (ref 4.0–10.3)
lymph#: 0.2 10*3/uL — ABNORMAL LOW (ref 0.9–3.3)

## 2013-05-24 LAB — COMPREHENSIVE METABOLIC PANEL (CC13)
ALT: 13 U/L (ref 0–55)
ANION GAP: 13 meq/L — AB (ref 3–11)
AST: 9 U/L (ref 5–34)
Albumin: 3.4 g/dL — ABNORMAL LOW (ref 3.5–5.0)
Alkaline Phosphatase: 117 U/L (ref 40–150)
BUN: 25.9 mg/dL (ref 7.0–26.0)
CALCIUM: 9.5 mg/dL (ref 8.4–10.4)
CHLORIDE: 99 meq/L (ref 98–109)
CO2: 24 meq/L (ref 22–29)
Creatinine: 1.3 mg/dL (ref 0.7–1.3)
GLUCOSE: 208 mg/dL — AB (ref 70–140)
POTASSIUM: 4.7 meq/L (ref 3.5–5.1)
Sodium: 137 mEq/L (ref 136–145)
TOTAL PROTEIN: 6.3 g/dL — AB (ref 6.4–8.3)
Total Bilirubin: 1.03 mg/dL (ref 0.20–1.20)

## 2013-05-24 NOTE — Telephone Encounter (Signed)
Gave pt appt for labs, md and chemo for january , left VM Michelle VM for chemo on january 27th to be adjusted and to add more chemo for february 2015

## 2013-05-24 NOTE — Patient Instructions (Signed)
Continue weekly labs as scheduled Advanced diet as tolerated Follow up in 2 weeks

## 2013-05-24 NOTE — Progress Notes (Addendum)
[place]  [phone] Fax:([phone]) [place], MD [address] [place] [address]  DIAGNOSIS: Small cell carcinoma of lung   Primary site: Lung (Left)   Staging method: AJCC 7th Edition   Clinical: Stage IIIA (T2a, N2, M0)   Summary: Stage IIIA (T2a, N2, M0) Malignant neoplasm of lower lobe of left lung   Primary site: Lung (Left)   Staging method: AJCC 7th Edition   Summary: Incomplete stage    PRIOR THERAPY: None  CURRENT THERAPY: Systemic chemotherapy with carboplatin for an AUC of 5 given on day 1 and etoposide at 120 mg per meter squared given on days 1, 2 and 3 with Neulasta support given on day 4 given every 3 weeks. Status post 4 cycles.  DISEASE STAGE: Limited stage small cell lung cancer Small cell carcinoma of lung   Primary site: Lung (Left)   Staging method: AJCC 7th Edition   Clinical: Stage IIIA (T2a, N2, M0)   Summary: Stage IIIA (T2a, N2, M0) Malignant neoplasm of lower lobe of left lung   Primary site: Lung (Left)   Staging method: AJCC 7th Edition   Summary: Incomplete stage  CHEMOTHERAPY INTENT: Palliative  CURRENT # OF CHEMOTHERAPY CYCLES: 4  CURRENT ANTIEMETICS: Zofran, dexamethasone and Compazine  CURRENT SMOKING STATUS: Former smoker, quit 09/09/2009  ORAL CHEMOTHERAPY AND CONSENT: N./A  CURRENT BISPHOSPHONATES USE: None  PAIN MANAGEMENT: None  NARCOTICS INDUCED CONSTIPATION: None  LIVING WILL AND CODE STATUS: ?   INTERVAL HISTORY: [name] 58 y.o. male returns for followup visit accompanied by his sister. The patient is feeling fine today with no specific complaints except for fatigue and moderate odynophagia secondary to radiation treatment. He is now status post 4 cycles of systemic chemotherapy with carboplatin and etoposide with Neulasta support. He was hospitalized in late December and had a CT angiography of the chest on 05/02/2013.  This study was negative for pulmonary embolism but also revealed stable disease regarding his small cell lung cancer. At that time he has completed 3 cycles of his chemotherapy. He received cycle #4 on 05/17/2013. He continues to have significant problems with swallowing solids and currently is existing primarily on milk mixed with ensure. His weight is down a little over 20 pounds since 05/02/2013. He completed his radiation therapy approximately one week before Christmas. Patient states that his throat is still Burns and hurts significantly. He does feel though, that his throat is slowly getting better. He only has episodes of vomiting if he tries to eat solid food. He denies any fever, chills, diarrhea or constipation. He denied cough, shortness of breath or night sweats. He does not tolerate fentanyl at all, leading to delirium, personality change, hallucinations and combative behavior.   MEDICAL HISTORY: Past Medical History  Diagnosis Date  . CAD (coronary artery disease)     a. PCI 3/08 to OM2 and mid CFX; b.  NSTEMI 6/10:  Prior stents patent, 90% dCFX,  mRCA occl (old) with collats; EF 55% on LV-gram => Xience DES 2.5 x 23 mm to distal CFX;  c. abnl MV 12/13 => LHC 04/23/12:  pLAD 50%, oOM1 80%, long stented segment of the CFX extending into the PLOM with the PLOM totally occluded ostially, CFX 70% ISR, mCFX 80% ISR, pRCA chronically occluded. Med Rx planned  . Diabetes mellitus type II   . Hyperlipidemia   . COPD (chronic obstructive pulmonary disease)     Quit tobacco 09/2009;  Gold Stage 2 with asthma component - FEV1 1.53 L/54%, 14% fev1 BD response, DLCO 54% - July 2011; MM genotype 01/07/10; unable to afford spiriva and unwilling to try ics due to prior renal failure: state to Dr. [name], Aug 2011; started on atrovent HFA fall 2011. no desturation on walk test Dec 2011  . CKD (chronic kidney disease)   . Obesity   . History of CVA (cerebrovascular accident) 2007    Without residual  defecits  . 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 no further rx  . CHF (congestive heart failure)   . Myocardial infarction     x3  . Hypertension   . Asthma     as child  . Shortness of breath     constant/wears 2 liters  . Pneumonia     02/14/2013 in hospital  . Stroke     7 years- no problems   . Cancer     lung cancer-02/14/2013  . Blood transfusion without reported diagnosis     ALLERGIES:  is allergic to erythromycin; fentanyl; and penicillins.  MEDICATIONS:  Current Outpatient Prescriptions  Medication Sig Dispense Refill  . albuterol (PROAIR HFA) 108 (90 BASE) MCG/ACT inhaler Inhale 2 puffs into the lungs every 6 (six) hours as needed for wheezing or shortness of breath.      Marland Kitchen albuterol (PROVENTIL) (2.5 MG/3ML) 0.083% nebulizer solution Take 3 mLs (2.5 mg total) by nebulization 5 (five) times daily as needed for wheezing or shortness of breath.  75 mL  12  . Alum & Mag Hydroxide-Simeth (MAGIC MOUTHWASH W/LIDOCAINE) SOLN Take 5 mLs by mouth 4 (four) times daily as needed for mouth pain.  500 mL  2  . atorvastatin (LIPITOR) 40 MG tablet Take 40 mg by mouth at bedtime.      Marland Kitchen atorvastatin (LIPITOR) 40 MG tablet TAKE  ONE TABLET BY MOUTH NIGHTLY AT BEDTIME  30 tablet  0  . CARAFATE 1 GM/10ML suspension TAKE TWO TEASPOONFULS BY MOUTH 4 TIMES DAILY WITH MEALS AND ATBEDTIME.  420 mL  2  . diltiazem (CARDIZEM CD) 240 MG 24 hr capsule Take 1 capsule (240 mg total) by mouth daily.  30 capsule  2  . Fluticasone Furoate-Vilanterol (BREO ELLIPTA) 100-25 MCG/INH AEPB Inhale 1 puff into the lungs 2 (two) times daily.  28 each  1  . HYDROcodone-acetaminophen (HYCET) 7.5-325 mg/15 ml solution  Take 10 mLs by mouth 4 (four) times daily as needed for moderate pain.  473 mL  0  . isosorbide mononitrate (IMDUR) 60 MG 24 hr tablet Take 1 tablet (60 mg total) by mouth daily.  30 tablet  8  . metFORMIN (GLUCOPHAGE-XR) 750 MG 24 hr tablet Take 1,500 mg by mouth daily with breakfast.      . metoprolol tartrate (LOPRESSOR) 25 MG tablet Take 1 tablet (25 mg total) by mouth 2 (two) times daily.  60 tablet  2  . ondansetron (ZOFRAN-ODT) 4 MG disintegrating tablet DISSOLVE ONE TABLET IN MOUTH EVERY EIGHT HOURS AS NEEDED NAUSEA ANDVOMITING  20 tablet  0  . pantoprazole (PROTONIX) 40 MG tablet Take 1 tablet (40 mg total) by mouth 2 (two) times daily.  60 tablet  2  .  risperiDONE (RISPERDAL) 1 MG tablet Take 1 tablet (1 mg total) by mouth 2 (two) times daily.  60 tablet  2  . nitroGLYCERIN (NITROSTAT) 0.4 MG SL tablet Place 0.4 mg under the tongue every 5 (five) minutes as needed for chest pain. May repeat up to 3 doses       No current facility-administered medications for this visit.    SURGICAL HISTORY:  Past Surgical History  Procedure Laterality Date  . Wedge resection  03/28/2010  . Coronary stent placement  2009  . Cardiac catheterization  2009  . Endobronchial ultrasound Bilateral 02/28/2013    Procedure: ENDOBRONCHIAL ULTRASOUND;  Surgeon: Brand Males, MD;  Location: [facility];  Service: Cardiopulmonary;  Laterality: Bilateral;  . Esophagogastroduodenoscopy N/A 05/04/2013    Procedure: ESOPHAGOGASTRODUODENOSCOPY (EGD);  Surgeon: Inda Castle, MD;  Location: [facility];  Service: Endoscopy;  Laterality: N/A;    REVIEW OF SYSTEMS:  Constitutional: positive for anorexia and fatigue Eyes: negative Ears, nose, mouth, throat, and face: positive for dysphagia and odynophagia thought related to radiation esophagitis Respiratory: positive for dyspnea on exertion Cardiovascular: negative Gastrointestinal: negative Genitourinary:negative Integument/breast:  negative Hematologic/lymphatic: negative Musculoskeletal:negative Neurological: negative Behavioral/Psych: negative Endocrine: negative Allergic/Immunologic: negative   PHYSICAL EXAMINATION: General appearance: alert, cooperative, appears stated age and no distress Head: Normocephalic, without obvious abnormality, atraumatic Neck: no adenopathy, no carotid bruit, no JVD, supple, symmetrical, trachea midline and thyroid not enlarged, symmetric, no tenderness/mass/nodules Lymph nodes: Cervical, supraclavicular, and axillary nodes normal. Resp: clear to auscultation bilaterally Cardio: regular rate and rhythm, S1, S2 normal, no murmur, click, rub or gallop GI: soft, non-tender; bowel sounds normal; no masses,  no organomegaly Extremities: extremities normal, atraumatic, no cyanosis or edema Neurologic: Alert and oriented X 3, normal strength and tone. Normal symmetric reflexes. Normal coordination and gait  ECOG PERFORMANCE STATUS: 1 - Symptomatic but completely ambulatory  Blood pressure 107/73, pulse 109, temperature 98.1 F (36.7 C), temperature source Oral, height 5\' 10"  (1.778 m), weight 204 lb 12.8 oz (92.897 kg), SpO2 98.00%.  LABORATORY DATA: Lab Results  Component Value Date   WBC 1.4* 05/24/2013   HGB 9.0* 05/24/2013   HCT 26.3* 05/24/2013   MCV 93.7 05/24/2013   PLT 61* 05/24/2013      Chemistry      Component Value Date/Time   NA 137 05/24/2013 1320   NA 135 05/06/2013 0407   K 4.7 05/24/2013 1320   K 3.3* 05/06/2013 0407   CL 99 05/06/2013 0407   CO2 24 05/24/2013 1320   CO2 26 05/06/2013 0407   BUN 25.9 05/24/2013 1320   BUN 20 05/06/2013 0407   CREATININE 1.3 05/24/2013 1320   CREATININE 1.25 05/06/2013 0407   CREATININE 1.46* 12/10/2010 0843      Component Value Date/Time   CALCIUM 9.5 05/24/2013 1320   CALCIUM 8.9 05/06/2013 0407   ALKPHOS 117 05/24/2013 1320   ALKPHOS 102 05/05/2013 0405   AST 9 05/24/2013 1320   AST 17 05/05/2013 0405   ALT 13 05/24/2013 1320    ALT 14 05/05/2013 0405   BILITOT 1.03 05/24/2013 1320   BILITOT 0.6 05/05/2013 0405       RADIOGRAPHIC STUDIES:  Ct Chest W Contrast  04/18/2013   CLINICAL DATA:  Small cell lung cancer diagnosed 02/2013, chemotherapy and XRT in progress, chest pain, cough, shortness of breath  EXAM: CT CHEST WITH CONTRAST  TECHNIQUE: Multidetector CT imaging of the chest was performed during intravenous contrast administration.  CONTRAST:  48mL OMNIPAQUE IOHEXOL 300 MG/ML  SOLN  COMPARISON:  PET-CT dated 02/24/2013.  CT chest dated 02/14/2013.  FINDINGS: Radiation changes in the superior segment left lower lobe. Prior left lower lobe pulmonary nodule/mass has essentially resolved.  Underlying moderate to severe centrilobular and paraseptal emphysematous changes. Postsurgical changes related to prior right lung wedge resection. No pleural effusion or pneumothorax.  Visualized thyroid is unremarkable.  The heart is normal in size. No pericardial effusion. Coronary atherosclerosis. Atherosclerotic calcifications of the aortic arch.  Improving thoracic lymphadenopathy, including:  --9 mm short axis prevascular node (series 2/ image 22), previously 17 mm  --14 mm AP window node (series 2/image 28), previously 26 mm  --10 mm short axis subcarinal node (series 2/ image 30), previously 12 mm  --12 mm short axis left hilar node (series 2/ image 32), previously 25 mm  Visualized upper abdomen is unremarkable.  Degenerative changes of the visualized thoracolumbar spine. Multiple right rib deformities related to prior thoracotomy.  IMPRESSION: Radiation changes in the superior segment left lower lobe. Postsurgical changes related to prior right lung wedge resection.  Prior left lower lobe pulmonary nodule/mass has essentially resolved.  Improving thoracic lymphadenopathy, as described above.   Electronically Signed   By: Julian Hy M.D.   On: 04/18/2013 09:37     ASSESSMENT/PLAN: This is a very pleasant 58 years old  white male with limited stage small cell lung cancer currently undergoing systemic chemotherapy concurrent with radiation, status post 4 cycles. The patient is tolerating his chemotherapy fairly well with no significant adverse effects.  His recent CT scan of the chest showed stability in his disease. Patient was discussed with also seen by Dr. Julien Nordmann. Dr. Earlie Server reviewed the CT angiography out from 05/02/2013. We'll plan to proceed with cycle #5 in 2 weeks and will plan a restaging CT scan of the chest with contrast after cycle #5. Neutropenic precautions were reviewed the patient and he voiced understanding. He is encouraged with advance his diet as tolerated. He'll continue with weekly labs as scheduled and return in 2 weeks prior to cycle #5 of his systemic chemotherapy with carboplatin and and etoposide with Neulasta support.  He was advised to call immediately if he has any concerning symptoms in the interval.  All questions were answered. The patient knows to call the clinic with any problems, questions or concerns. We can certainly see the patient much sooner if necessary.    Carlton Adam, PA-C 05/24/2013  ADDENDUM: Hematology/Oncology Attending: I had the face to face encounter with the patient. I recommended his care plan. This is a very pleasant 58 years old white male with limited stage small cell lung cancer currently undergoing systemic chemotherapy with carboplatin and etoposide status post 4 cycles. His last dose of chemotherapy was given last week. The patient is tolerating his treatment fairly well except for increasing fatigue. He denied having any significant nausea or vomiting. He denied having any fever or chills. I recommended for the patient to proceed with cycle #5 as scheduled in 2 weeks. He would have repeat CT scan of the chest after completion of cycle #5 restaging of his disease. He was advised to call immediately if he has any concerning symptoms in the  interval.  Disclaimer: This note was dictated with voice recognition software. Similar sounding words can inadvertently be transcribed and may not be corrected upon review. Eilleen Kempf., MD 05/25/2013

## 2013-05-27 ENCOUNTER — Other Ambulatory Visit: Payer: Self-pay | Admitting: Radiation Oncology

## 2013-05-27 ENCOUNTER — Telehealth: Payer: Self-pay

## 2013-05-27 MED ORDER — HYDROCODONE-ACETAMINOPHEN 7.5-325 MG/15ML PO SOLN: 10.0000 mL | Freq: Four times a day (QID) | ORAL | Status: DC | PRN

## 2013-05-27 NOTE — Telephone Encounter (Signed)
Informed patient prescription for hydrocdone ready for pick up and that he needs to try to taper down from 4 to 3 times /day and the twice daily.Pan is greater at night and he stated that he had been takin additional sips of medication but he will attempt to decrease how often he takes medication."I do want to slow down."

## 2013-05-28 ENCOUNTER — Telehealth: Payer: Self-pay | Admitting: Internal Medicine

## 2013-05-28 NOTE — Telephone Encounter (Signed)
talked to pt , informed him of chemo appt on 1/27 being in the [place] and MD visit @ 0945 am, gave pt a choice if he is not willing to wait until 1:30 pm, suggested to pt that i can move md appt to 1/26 and leave chemo @ same spot, ML is also full that day

## 2013-05-31 ENCOUNTER — Telehealth: Payer: Self-pay | Admitting: *Deleted

## 2013-05-31 ENCOUNTER — Ambulatory Visit (HOSPITAL_COMMUNITY)
Admission: RE | Admit: 2013-05-31 | Discharge: 2013-05-31 | Disposition: A | Discharge status: Home / Self Care | Payer: BC Managed Care – PPO | Source: Ambulatory Visit | Attending: Internal Medicine | Admitting: Internal Medicine

## 2013-05-31 ENCOUNTER — Other Ambulatory Visit: Payer: Self-pay | Admitting: *Deleted

## 2013-05-31 ENCOUNTER — Other Ambulatory Visit (HOSPITAL_BASED_OUTPATIENT_CLINIC_OR_DEPARTMENT_OTHER): Payer: BC Managed Care – PPO

## 2013-05-31 DIAGNOSIS — T451X5A Adverse effect of antineoplastic and immunosuppressive drugs, initial encounter: Principal | ICD-10-CM

## 2013-05-31 DIAGNOSIS — D61810 Antineoplastic chemotherapy induced pancytopenia: Secondary | ICD-10-CM

## 2013-05-31 DIAGNOSIS — C3430 Malignant neoplasm of lower lobe, unspecified bronchus or lung: Secondary | ICD-10-CM

## 2013-05-31 DIAGNOSIS — C3492 Malignant neoplasm of unspecified part of left bronchus or lung: Secondary | ICD-10-CM

## 2013-05-31 LAB — CBC WITH DIFFERENTIAL/PLATELET
BASO%: 0.5 % (ref 0.0–2.0)
Basophils Absolute: 0 10*3/uL (ref 0.0–0.1)
EOS%: 0.8 % (ref 0.0–7.0)
Eosinophils Absolute: 0 10*3/uL (ref 0.0–0.5)
HCT: 20.5 % — ABNORMAL LOW (ref 38.4–49.9)
HGB: 7.2 g/dL — ABNORMAL LOW (ref 13.0–17.1)
LYMPH#: 0.4 10*3/uL — AB (ref 0.9–3.3)
LYMPH%: 7 % — AB (ref 14.0–49.0)
MCH: 32 pg (ref 27.2–33.4)
MCHC: 35 g/dL (ref 32.0–36.0)
MCV: 91.3 fL (ref 79.3–98.0)
MONO#: 0.5 10*3/uL (ref 0.1–0.9)
MONO%: 9.1 % (ref 0.0–14.0)
NEUT#: 4.8 10*3/uL (ref 1.5–6.5)
NEUT%: 82.6 % — ABNORMAL HIGH (ref 39.0–75.0)
PLATELETS: 13 10*3/uL — AB (ref 140–400)
RBC: 2.24 10*6/uL — AB (ref 4.20–5.82)
RDW: 21.1 % — ABNORMAL HIGH (ref 11.0–14.6)
WBC: 5.8 10*3/uL (ref 4.0–10.3)

## 2013-05-31 LAB — COMPREHENSIVE METABOLIC PANEL (CC13)
ALT: 11 U/L (ref 0–55)
ANION GAP: 12 meq/L — AB (ref 3–11)
AST: 9 U/L (ref 5–34)
Albumin: 3.3 g/dL — ABNORMAL LOW (ref 3.5–5.0)
Alkaline Phosphatase: 134 U/L (ref 40–150)
BILIRUBIN TOTAL: 0.48 mg/dL (ref 0.20–1.20)
BUN: 28.4 mg/dL — ABNORMAL HIGH (ref 7.0–26.0)
CO2: 25 meq/L (ref 22–29)
Calcium: 9.7 mg/dL (ref 8.4–10.4)
Chloride: 100 mEq/L (ref 98–109)
Creatinine: 1.4 mg/dL — ABNORMAL HIGH (ref 0.7–1.3)
Glucose: 235 mg/dl — ABNORMAL HIGH (ref 70–140)
Potassium: 4.5 mEq/L (ref 3.5–5.1)
Sodium: 136 mEq/L (ref 136–145)
Total Protein: 6.4 g/dL (ref 6.4–8.3)

## 2013-05-31 NOTE — Telephone Encounter (Signed)
hbg 7.2, plts 13.  Per Dr Vista Mink, pt needs blood and plts.  Spoke to pt's sister, appts given for transfusion this week.  SLJ

## 2013-06-02 ENCOUNTER — Encounter: Payer: Self-pay | Admitting: Radiation Oncology

## 2013-06-02 ENCOUNTER — Ambulatory Visit
Admission: RE | Admit: 2013-06-02 | Discharge: 2013-06-02 | Disposition: A | Discharge status: Home / Self Care | Payer: BC Managed Care – PPO | Source: Ambulatory Visit | Attending: Radiation Oncology | Admitting: Radiation Oncology

## 2013-06-02 VITALS — BP 108/73 | HR 121 | Temp 97.7°F | Ht 70.0 in | Wt 199.2 lb

## 2013-06-02 DIAGNOSIS — R634 Abnormal weight loss: Secondary | ICD-10-CM | POA: Insufficient documentation

## 2013-06-02 DIAGNOSIS — Z923 Personal history of irradiation: Secondary | ICD-10-CM | POA: Insufficient documentation

## 2013-06-02 DIAGNOSIS — C3432 Malignant neoplasm of lower lobe, left bronchus or lung: Secondary | ICD-10-CM

## 2013-06-02 DIAGNOSIS — R112 Nausea with vomiting, unspecified: Secondary | ICD-10-CM | POA: Insufficient documentation

## 2013-06-02 DIAGNOSIS — R1310 Dysphagia, unspecified: Secondary | ICD-10-CM | POA: Insufficient documentation

## 2013-06-02 DIAGNOSIS — Z79899 Other long term (current) drug therapy: Secondary | ICD-10-CM | POA: Insufficient documentation

## 2013-06-02 DIAGNOSIS — C3490 Malignant neoplasm of unspecified part of unspecified bronchus or lung: Secondary | ICD-10-CM | POA: Insufficient documentation

## 2013-06-02 MED ORDER — HYDROCODONE-ACETAMINOPHEN 7.5-325 MG/15ML PO SOLN: 10.0000 mL | Freq: Four times a day (QID) | ORAL | Status: DC | PRN

## 2013-06-02 MED ORDER — FLUCONAZOLE 10 MG/ML PO SUSR: 100.0000 mg | Freq: Every day | ORAL | Status: DC

## 2013-06-02 MED ORDER — ONDANSETRON 8 MG PO TBDP
8.0000 mg | ORAL_TABLET | Freq: Once | ORAL | Status: AC
  Administered 2013-06-02: 8 mg via ORAL
  Filled 2013-06-02: qty 1

## 2013-06-02 NOTE — Progress Notes (Signed)
[name] here today s/p radiation to his left lung.  He reports nausea and vomiting x 3 today, the last in the [facility] parking lot. Burning in his esophagus when he drink liquids and states to is painful to swallow pills.  He is not eating any solid food, but eats pudding and yogurt  and is drinking, on average, two ensure daily.   He states he feels miserable.

## 2013-06-02 NOTE — Progress Notes (Addendum)
Mr. Hasegawa was given Zofran ODT at 1705 as ordered by Dr. Pablo Ledger for his continued nausea and vomiting prior to day's visit.

## 2013-06-03 ENCOUNTER — Ambulatory Visit (HOSPITAL_BASED_OUTPATIENT_CLINIC_OR_DEPARTMENT_OTHER): Payer: BC Managed Care – PPO

## 2013-06-03 ENCOUNTER — Other Ambulatory Visit (HOSPITAL_BASED_OUTPATIENT_CLINIC_OR_DEPARTMENT_OTHER): Payer: BC Managed Care – PPO

## 2013-06-03 VITALS — BP 96/61 | HR 97 | Temp 97.2°F | Resp 18

## 2013-06-03 DIAGNOSIS — D61810 Antineoplastic chemotherapy induced pancytopenia: Secondary | ICD-10-CM

## 2013-06-03 DIAGNOSIS — T451X5A Adverse effect of antineoplastic and immunosuppressive drugs, initial encounter: Secondary | ICD-10-CM

## 2013-06-03 DIAGNOSIS — C3492 Malignant neoplasm of unspecified part of left bronchus or lung: Secondary | ICD-10-CM

## 2013-06-03 DIAGNOSIS — C3430 Malignant neoplasm of lower lobe, unspecified bronchus or lung: Secondary | ICD-10-CM

## 2013-06-03 LAB — COMPREHENSIVE METABOLIC PANEL (CC13)
ALT: 11 U/L (ref 0–55)
ANION GAP: 14 meq/L — AB (ref 3–11)
AST: 10 U/L (ref 5–34)
Albumin: 3.5 g/dL (ref 3.5–5.0)
Alkaline Phosphatase: 138 U/L (ref 40–150)
BUN: 21.8 mg/dL (ref 7.0–26.0)
CALCIUM: 9.7 mg/dL (ref 8.4–10.4)
CHLORIDE: 98 meq/L (ref 98–109)
CO2: 23 mEq/L (ref 22–29)
Creatinine: 1.5 mg/dL — ABNORMAL HIGH (ref 0.7–1.3)
GLUCOSE: 222 mg/dL — AB (ref 70–140)
Potassium: 4.1 mEq/L (ref 3.5–5.1)
Sodium: 135 mEq/L — ABNORMAL LOW (ref 136–145)
Total Bilirubin: 0.52 mg/dL (ref 0.20–1.20)
Total Protein: 6.6 g/dL (ref 6.4–8.3)

## 2013-06-03 LAB — CBC WITH DIFFERENTIAL/PLATELET
BASO%: 0.6 % (ref 0.0–2.0)
BASOS ABS: 0 10*3/uL (ref 0.0–0.1)
EOS%: 0.7 % (ref 0.0–7.0)
Eosinophils Absolute: 0 10*3/uL (ref 0.0–0.5)
HEMATOCRIT: 19.9 % — AB (ref 38.4–49.9)
HEMOGLOBIN: 7 g/dL — AB (ref 13.0–17.1)
LYMPH#: 0.6 10*3/uL — AB (ref 0.9–3.3)
LYMPH%: 8.1 % — ABNORMAL LOW (ref 14.0–49.0)
MCH: 32.5 pg (ref 27.2–33.4)
MCHC: 35.1 g/dL (ref 32.0–36.0)
MCV: 92.4 fL (ref 79.3–98.0)
MONO#: 0.8 10*3/uL (ref 0.1–0.9)
MONO%: 11.2 % (ref 0.0–14.0)
NEUT#: 5.4 10*3/uL (ref 1.5–6.5)
NEUT%: 79.4 % — AB (ref 39.0–75.0)
Platelets: 30 10*3/uL — ABNORMAL LOW (ref 140–400)
RBC: 2.15 10*6/uL — ABNORMAL LOW (ref 4.20–5.82)
RDW: 21 % — ABNORMAL HIGH (ref 11.0–14.6)
WBC: 6.8 10*3/uL (ref 4.0–10.3)

## 2013-06-03 LAB — PREPARE RBC (CROSSMATCH)

## 2013-06-03 MED ORDER — ACETAMINOPHEN 325 MG PO TABS
ORAL_TABLET | ORAL | Status: AC
  Filled 2013-06-03: qty 2

## 2013-06-03 MED ORDER — ACETAMINOPHEN 325 MG PO TABS
650.0000 mg | ORAL_TABLET | Freq: Once | ORAL | Status: AC
Start: 2013-06-03 — End: 2013-06-03
  Administered 2013-06-03: 650 mg via ORAL

## 2013-06-03 MED ORDER — DIPHENHYDRAMINE HCL 25 MG PO CAPS
25.0000 mg | ORAL_CAPSULE | Freq: Once | ORAL | Status: AC
  Administered 2013-06-03: 25 mg via ORAL

## 2013-06-03 MED ORDER — DIPHENHYDRAMINE HCL 25 MG PO CAPS
ORAL_CAPSULE | ORAL | Status: AC
  Filled 2013-06-03: qty 1

## 2013-06-03 MED ORDER — SODIUM CHLORIDE 0.9 % IV SOLN
250.0000 mL | Freq: Once | INTRAVENOUS | Status: AC
  Administered 2013-06-03: 250 mL via INTRAVENOUS

## 2013-06-03 NOTE — Progress Notes (Signed)
Department of Radiation Oncology  Phone:  [phone] Fax:        [phone]   Name: [name] MRN: [id number]  DOB: [birth year]  Date: 06/02/2013  Follow Up Visit Note  Diagnosis: Limited stage small cell lung cancer  Summary and Interval since last radiation: 60 Gy to the left lung completed 04/28/13 (1 month from treatment)  Interval History: [name] presents today for routine followup.  He had been improving over the past several weeks and is using his hydrocodone less. He had gotten to the point where he was eating soft foods. Apparently a few days ago he swallowed 1 of his metformin capsules and it cuts.. He has had difficulty with odynophagia since that time. He actually threw up in the parking lot on his way to the appointment today secondary to drinking some water and feeling nauseated. This is responded with a Zofran which she had in the office. It's been about 3 days since his last bowel movement. He is using Magic mouthwash and high set a few times a day. He had his last chemotherapy 2 weeks ago and is scheduled again next week. His weight is down about 5 pounds from last week. He appears fatigued. He is in a wheelchair.  Allergies:  Allergies  Allergen Reactions  . Erythromycin Other (See Comments)    Reaction while in hospital - pt doesn't know reaction  . Fentanyl Other (See Comments)    delirium  . Penicillins Other (See Comments)    Childhood reaction - unknown    Medications:  Current Outpatient Prescriptions  Medication Sig Dispense Refill  . albuterol (PROAIR HFA) 108 (90 BASE) MCG/ACT inhaler Inhale 2 puffs into the lungs every 6 (six) hours as needed for wheezing or shortness of breath.      Marland Kitchen albuterol (PROVENTIL) (2.5 MG/3ML) 0.083% nebulizer solution Take 3 mLs (2.5 mg total) by nebulization 5 (five) times daily as needed for wheezing or shortness of breath.  75 mL  12  . Alum & Mag Hydroxide-Simeth (MAGIC MOUTHWASH W/LIDOCAINE) SOLN Take 5 mLs by  mouth 4 (four) times daily as needed for mouth pain.  500 mL  2  . Fluticasone Furoate-Vilanterol (BREO ELLIPTA) 100-25 MCG/INH AEPB Inhale 1 puff into the lungs 2 (two) times daily.  28 each  1  . HYDROcodone-acetaminophen (HYCET) 7.5-325 mg/15 ml solution Take 10 mLs by mouth 4 (four) times daily as needed for moderate pain.  473 mL  0  . nitroGLYCERIN (NITROSTAT) 0.4 MG SL tablet Place 0.4 mg under the tongue every 5 (five) minutes as needed for chest pain. May repeat up to 3 doses      . ondansetron (ZOFRAN-ODT) 4 MG disintegrating tablet DISSOLVE ONE TABLET IN MOUTH EVERY EIGHT HOURS AS NEEDED NAUSEA ANDVOMITING  20 tablet  0  . atorvastatin (LIPITOR) 40 MG tablet TAKE  ONE TABLET BY MOUTH NIGHTLY AT BEDTIME  30 tablet  0  . CARAFATE 1 GM/10ML suspension TAKE TWO TEASPOONFULS BY MOUTH 4 TIMES DAILY WITH MEALS AND ATBEDTIME.  420 mL  2  . diltiazem (CARDIZEM CD) 240 MG 24 hr capsule Take 1 capsule (240 mg total) by mouth daily.  30 capsule  2  . fluconazole (DIFLUCAN) 10 MG/ML suspension Take 10 mLs (100 mg total) by mouth daily.  70 mL  0  . isosorbide mononitrate (IMDUR) 60 MG 24 hr tablet Take 1 tablet (60 mg total) by mouth daily.  30 tablet  8  . metFORMIN (GLUCOPHAGE-XR) 750 MG  24 hr tablet Take 1,500 mg by mouth daily with breakfast.      . metoprolol tartrate (LOPRESSOR) 25 MG tablet Take 1 tablet (25 mg total) by mouth 2 (two) times daily.  60 tablet  2  . pantoprazole (PROTONIX) 40 MG tablet Take 1 tablet (40 mg total) by mouth 2 (two) times daily.  60 tablet  2  . risperiDONE (RISPERDAL) 1 MG tablet Take 1 tablet (1 mg total) by mouth 2 (two) times daily.  60 tablet  2   No current facility-administered medications for this encounter.    Physical Exam:  Filed Vitals:   06/02/13 1630  BP: 108/73  Pulse: 121  Temp: 97.7 F (36.5 C)  Height: 5\' 10"  (1.778 m)  Weight: 199 lb 3.2 oz (90.357 kg)   fatigued male who appears ill sitting in a wheelchair  IMPRESSION: [name] is a 57  y.o. male status post chemoradiation for limited stage small cell lung cancer infiltrating but with a small setback  PLAN:  I discussed with him in remission today. He declined. He is feeling better after the Zofran. We discussed a bowel regimen for constipation. I've refilled the high set. I've also placed him on Diflucan for 7 days in case these developed some candidiasis in his esophagus. We discussed crushing his pills or converting to liquid. He is going to discuss this with his pharmacist. He has followup with medical oncology next week. I will see him back after his chemotherapy is complete for discussion of prophylactic cranial radiation which I discussed with him. He states he is really too overwhelmed and feels too badly to talk about today to understand this is our recommendation.    Thea Silversmith, MD

## 2013-06-03 NOTE — Patient Instructions (Signed)
Blood Transfusion Information WHAT IS A BLOOD TRANSFUSION? A transfusion is the replacement of blood or some of its parts. Blood is made up of multiple cells which provide different functions.  Red blood cells carry oxygen and are used for blood loss replacement.  White blood cells fight against infection.  Platelets control bleeding.  Plasma helps clot blood.  Other blood products are available for specialized needs, such as hemophilia or other clotting disorders. BEFORE THE TRANSFUSION  Who gives blood for transfusions?   You may be able to donate blood to be used at a later date on yourself (autologous donation).  Relatives can be asked to donate blood. This is generally not any safer than if you have received blood from a stranger. The same precautions are taken to ensure safety when a relative's blood is donated.  Healthy volunteers who are fully evaluated to make sure their blood is safe. This is blood bank blood. Transfusion therapy is the safest it has ever been in the practice of medicine. Before blood is taken from a donor, a complete history is taken to make sure that person has no history of diseases nor engages in risky social behavior (examples are intravenous drug use or sexual activity with multiple partners). The donor's travel history is screened to minimize risk of transmitting infections, such as malaria. The donated blood is tested for signs of infectious diseases, such as HIV and hepatitis. The blood is then tested to be sure it is compatible with you in order to minimize the chance of a transfusion reaction. If you or a relative donates blood, this is often done in anticipation of surgery and is not appropriate for emergency situations. It takes many days to process the donated blood. RISKS AND COMPLICATIONS Although transfusion therapy is very safe and saves many lives, the main dangers of transfusion include:   Getting an infectious disease.  Developing a  transfusion reaction. This is an allergic reaction to something in the blood you were given. Every precaution is taken to prevent this. The decision to have a blood transfusion has been considered carefully by your caregiver before blood is given. Blood is not given unless the benefits outweigh the risks. AFTER THE TRANSFUSION  Right after receiving a blood transfusion, you will usually feel much better and more energetic. This is especially true if your red blood cells have gotten low (anemic). The transfusion raises the level of the red blood cells which carry oxygen, and this usually causes an energy increase.  The nurse administering the transfusion will monitor you carefully for complications. HOME CARE INSTRUCTIONS  No special instructions are needed after a transfusion. You may find your energy is better. Speak with your caregiver about any limitations on activity for underlying diseases you may have. SEEK MEDICAL CARE IF:   Your condition is not improving after your transfusion.  You develop redness or irritation at the intravenous (IV) site. SEEK IMMEDIATE MEDICAL CARE IF:  Any of the following symptoms occur over the next 12 hours:  Shaking chills.  You have a temperature by mouth above 102 F (38.9 C), not controlled by medicine.  Chest, back, or muscle pain.  People around you feel you are not acting correctly or are confused.  Shortness of breath or difficulty breathing.  Dizziness and fainting.  You get a rash or develop hives.  You have a decrease in urine output.  Your urine turns a dark color or changes to pink, red, or brown. Any of the following   symptoms occur over the next 10 days:  You have a temperature by mouth above 102 F (38.9 C), not controlled by medicine.  Shortness of breath.  Weakness after normal activity.  The white part of the eye turns yellow (jaundice).  You have a decrease in the amount of urine or are urinating less often.  Your  urine turns a dark color or changes to pink, red, or brown. Document Released: 04/25/2000 Document Revised: 07/21/2011 Document Reviewed: 12/13/2007 [employer] Patient Information 2014 [employer].  

## 2013-06-05 ENCOUNTER — Other Ambulatory Visit: Payer: Self-pay | Admitting: Cardiology

## 2013-06-05 LAB — TYPE AND SCREEN
ABO/RH(D): A NEG
ANTIBODY SCREEN: NEGATIVE
UNIT DIVISION: 0
Unit division: 0

## 2013-06-05 LAB — PREPARE PLATELET PHERESIS: Unit division: 0

## 2013-06-07 ENCOUNTER — Encounter (INDEPENDENT_AMBULATORY_CARE_PROVIDER_SITE_OTHER): Payer: Self-pay

## 2013-06-07 ENCOUNTER — Ambulatory Visit: Payer: BC Managed Care – PPO | Admitting: Nutrition

## 2013-06-07 ENCOUNTER — Encounter: Payer: Self-pay | Admitting: Internal Medicine

## 2013-06-07 ENCOUNTER — Other Ambulatory Visit: Payer: Self-pay

## 2013-06-07 ENCOUNTER — Ambulatory Visit (HOSPITAL_BASED_OUTPATIENT_CLINIC_OR_DEPARTMENT_OTHER): Payer: BC Managed Care – PPO | Admitting: Internal Medicine

## 2013-06-07 ENCOUNTER — Telehealth: Payer: Self-pay | Admitting: Internal Medicine

## 2013-06-07 ENCOUNTER — Ambulatory Visit: Payer: Self-pay

## 2013-06-07 ENCOUNTER — Encounter: Payer: Self-pay | Admitting: Nutrition

## 2013-06-07 ENCOUNTER — Other Ambulatory Visit (HOSPITAL_BASED_OUTPATIENT_CLINIC_OR_DEPARTMENT_OTHER): Payer: BC Managed Care – PPO

## 2013-06-07 VITALS — BP 114/67 | HR 111 | Temp 97.8°F | Resp 18 | Ht 70.0 in | Wt 202.7 lb

## 2013-06-07 DIAGNOSIS — R5381 Other malaise: Secondary | ICD-10-CM

## 2013-06-07 DIAGNOSIS — R5383 Other fatigue: Secondary | ICD-10-CM

## 2013-06-07 DIAGNOSIS — C3492 Malignant neoplasm of unspecified part of left bronchus or lung: Secondary | ICD-10-CM

## 2013-06-07 DIAGNOSIS — D6959 Other secondary thrombocytopenia: Secondary | ICD-10-CM

## 2013-06-07 DIAGNOSIS — C3490 Malignant neoplasm of unspecified part of unspecified bronchus or lung: Secondary | ICD-10-CM

## 2013-06-07 LAB — CBC WITH DIFFERENTIAL/PLATELET
BASO%: 0.1 % (ref 0.0–2.0)
Basophils Absolute: 0 10*3/uL (ref 0.0–0.1)
EOS%: 0.4 % (ref 0.0–7.0)
Eosinophils Absolute: 0 10*3/uL (ref 0.0–0.5)
HCT: 26.9 % — ABNORMAL LOW (ref 38.4–49.9)
HGB: 9 g/dL — ABNORMAL LOW (ref 13.0–17.1)
LYMPH%: 7.1 % — AB (ref 14.0–49.0)
MCH: 30.5 pg (ref 27.2–33.4)
MCHC: 33.5 g/dL (ref 32.0–36.0)
MCV: 91.2 fL (ref 79.3–98.0)
MONO#: 0.7 10*3/uL (ref 0.1–0.9)
MONO%: 10.8 % (ref 0.0–14.0)
NEUT#: 5.5 10*3/uL (ref 1.5–6.5)
NEUT%: 81.6 % — ABNORMAL HIGH (ref 39.0–75.0)
Platelets: 76 10*3/uL — ABNORMAL LOW (ref 140–400)
RBC: 2.95 10*6/uL — AB (ref 4.20–5.82)
RDW: 19.1 % — ABNORMAL HIGH (ref 11.0–14.6)
WBC: 6.8 10*3/uL (ref 4.0–10.3)
lymph#: 0.5 10*3/uL — ABNORMAL LOW (ref 0.9–3.3)

## 2013-06-07 NOTE — Telephone Encounter (Signed)
Gave pt appt for lab and Md  for March 2015

## 2013-06-07 NOTE — Progress Notes (Signed)
Nutrition followup completed with patient.  Patient reports his treatment completed.  He denies nausea and taste alterations.  He has a great appetite.  Weight documented as 202.7 pounds on January 27, which is decreased from 225 pounds December 22.  Patient reports that he recently choked on food causing his esophagus to become sore again.  He is tolerating yogurt, milk, ensure and some other soft foods.  Nutrition diagnosis: Food and nutrition related knowledge deficit resolved.  I provided support and encouragement for patient to continue soft, moist foods.  Patient is to use oral nutrition supplements as needed to promote weight stabilization.  Questions were answered.  Patient feels comfortable with diet advancement.  Patient agrees to call me with questions or concerns in the future.

## 2013-06-07 NOTE — Progress Notes (Signed)
[name]  [phone] [phone] OFFICE VISIT PROGRESS NOTE  Kevin Reichert, MD [address] [place] [address]  DIAGNOSIS: Small cell carcinoma of lung   Primary site: Lung (Left)   Staging method: AJCC 7th Edition   Clinical: Stage IIIA (T2a, N2, M0)   Summary: Stage IIIA (T2a, N2, M0) Malignant neoplasm of lower lobe of left lung   Primary site: Lung (Left)   Staging method: AJCC 7th Edition   Summary: Incomplete stage    PRIOR THERAPY: None  CURRENT THERAPY: Systemic chemotherapy with carboplatin for an AUC of 5 given on day 1 and etoposide at 120 mg per meter squared given on days 1, 2 and 3 with Neulasta support given on day 4 given every 3 weeks. Status post 2 cycles.  DISEASE STAGE: Limited stage small cell lung cancer Small cell carcinoma of lung   Primary site: Lung (Left)   Staging method: AJCC 7th Edition   Clinical: Stage IIIA (T2a, N2, M0)   Summary: Stage IIIA (T2a, N2, M0) Malignant neoplasm of lower lobe of left lung   Primary site: Lung (Left)   Staging method: AJCC 7th Edition   Summary: Incomplete stage  CHEMOTHERAPY INTENT: Palliative  CURRENT # OF CHEMOTHERAPY CYCLES: 4  CURRENT ANTIEMETICS: Zofran, dexamethasone and Compazine  CURRENT SMOKING STATUS: Former smoker, quit 09/09/2009  ORAL CHEMOTHERAPY AND CONSENT: N./A  CURRENT BISPHOSPHONATES USE: None  PAIN MANAGEMENT: None  NARCOTICS INDUCED CONSTIPATION: None  LIVING WILL AND CODE STATUS: ?   INTERVAL HISTORY: [name] 58 y.o. male returns for followup visit accompanied by his niece. The patient is feeling fine today with no specific complaints except for mild odynophagia secondary to radiation treatment. He tolerated the last cycle of his systemic chemotherapy fairly well with no significant complaints except for fatigue secondary to chemotherapy-induced anemia and the patient received 2 units of PRBCs transfusion  recently. He denied having any fever or chills. He denied having any nausea or vomiting. The patient has no chest pain, shortness of breath, cough or hemoptysis. He notes significant improvement in in the chest wheezes.   MEDICAL HISTORY: Past Medical History  Diagnosis Date  . CAD (coronary artery disease)     a. PCI 3/08 to OM2 and mid CFX; b.  NSTEMI 6/10:  Prior stents patent, 90% dCFX,  mRCA occl (old) with collats; EF 55% on LV-gram => Xience DES 2.5 x 23 mm to distal CFX;  c. abnl MV 12/13 => LHC 04/23/12:  pLAD 50%, oOM1 80%, long stented segment of the CFX extending into the PLOM with the PLOM totally occluded ostially, CFX 70% ISR, mCFX 80% ISR, pRCA chronically occluded. Med Rx planned  . Diabetes mellitus type II   . Hyperlipidemia   . COPD (chronic obstructive pulmonary disease)     Quit tobacco 09/2009; Gold Stage 2 with asthma component - FEV1 1.53 L/54%, 14% fev1 BD response, DLCO 54% - July 2011; MM genotype 01/07/10; unable to afford spiriva and unwilling to try ics due to prior renal failure: state to Dr. [name], Aug 2011; started on atrovent HFA fall 2011. no desturation on walk test Dec 2011  . CKD (chronic kidney disease)   . Obesity   . History of CVA (cerebrovascular accident) 2007    Without residual defecits  . 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 no further rx  . CHF (congestive heart failure)   . Myocardial infarction     x3  . Hypertension   . Asthma     as child  . Shortness of breath     constant/wears 2 liters  . Pneumonia     02/14/2013 in hospital  . Stroke     7 years- no problems   . Cancer     lung cancer-02/14/2013  . Blood transfusion without  reported diagnosis     ALLERGIES:  is allergic to erythromycin; fentanyl; and penicillins.  MEDICATIONS:  Current Outpatient Prescriptions  Medication Sig Dispense Refill  . albuterol (PROAIR HFA) 108 (90 BASE) MCG/ACT inhaler Inhale 2 puffs into the lungs every 6 (six) hours as needed for wheezing or shortness of breath.      Marland Kitchen albuterol (PROVENTIL) (2.5 MG/3ML) 0.083% nebulizer solution Take 3 mLs (2.5 mg total) by nebulization 5 (five) times daily as needed for wheezing or shortness of breath.  75 mL  12  . Alum & Mag Hydroxide-Simeth (MAGIC MOUTHWASH W/LIDOCAINE) SOLN Take 5 mLs by mouth 4 (four) times daily as needed for mouth pain.  500 mL  2  . atorvastatin (LIPITOR) 40 MG tablet TAKE  ONE TABLET BY MOUTH NIGHTLY AT BEDTIME  30 tablet  0  . CARAFATE 1 GM/10ML suspension TAKE TWO TEASPOONFULS BY MOUTH 4 TIMES DAILY WITH MEALS AND ATBEDTIME.  420 mL  2  . diltiazem (CARDIZEM CD) 240 MG 24 hr capsule Take 1 capsule (240 mg total) by mouth daily.  30 capsule  2  . fluconazole (DIFLUCAN) 10 MG/ML suspension Take 10 mLs (100 mg total) by mouth daily.  70 mL  0  . Fluticasone Furoate-Vilanterol (BREO ELLIPTA) 100-25 MCG/INH AEPB Inhale 1 puff into the lungs 2 (two) times daily.  28 each  1  . glipiZIDE (GLUCOTROL) 10 MG tablet Take 10 mg by mouth daily.      Marland Kitchen HYDROcodone-acetaminophen (HYCET) 7.5-325 mg/15 ml solution Take 10 mLs by mouth 4 (four) times daily as needed for moderate pain.  473 mL  0  . isosorbide mononitrate (IMDUR) 60 MG 24 hr tablet Take 1 tablet (60 mg total) by mouth daily.  30 tablet  8  . metoprolol tartrate (LOPRESSOR) 25 MG tablet Take 1 tablet (25 mg total) by mouth 2 (two) times daily.  60 tablet  2  . nitroGLYCERIN (NITROSTAT) 0.4 MG SL tablet Place 0.4 mg under the tongue every 5 (five) minutes as needed for chest pain. May repeat up to 3 doses      . ondansetron (ZOFRAN-ODT) 4 MG disintegrating tablet DISSOLVE ONE TABLET IN MOUTH EVERY EIGHT HOURS AS NEEDED NAUSEA  ANDVOMITING  20 tablet  0  . pantoprazole (PROTONIX) 40 MG tablet Take 1 tablet (40 mg total) by mouth 2 (two) times daily.  60 tablet  2  . risperiDONE (RISPERDAL) 1 MG tablet Take 1 tablet (1 mg total) by mouth 2 (two) times daily.  60 tablet  2   No current facility-administered medications for this visit.    SURGICAL HISTORY:  Past Surgical History  Procedure Laterality Date  . Wedge resection  03/28/2010  . Coronary stent placement  2009  . Cardiac catheterization  2009  . Endobronchial ultrasound Bilateral 02/28/2013    Procedure: ENDOBRONCHIAL ULTRASOUND;  Surgeon: Brand Males,  MD;  Location: [facility];  Service: Cardiopulmonary;  Laterality: Bilateral;  . Esophagogastroduodenoscopy N/A 05/04/2013    Procedure: ESOPHAGOGASTRODUODENOSCOPY (EGD);  Surgeon: Inda Castle, MD;  Location: [facility];  Service: Endoscopy;  Laterality: N/A;    REVIEW OF SYSTEMS:  Constitutional: positive for fatigue Eyes: negative Ears, nose, mouth, throat, and face: positive for sore throat and Occasional white drainage from his right tonsillar area not associated with fever chills or foul-smelling secretions, long-standing issue Respiratory: positive for dyspnea on exertion Cardiovascular: negative Gastrointestinal: negative Genitourinary:negative Integument/breast: negative Hematologic/lymphatic: negative Musculoskeletal:negative Neurological: negative Behavioral/Psych: negative Endocrine: negative Allergic/Immunologic: negative   PHYSICAL EXAMINATION: General appearance: alert, cooperative, appears stated age and no distress Head: Normocephalic, without obvious abnormality, atraumatic Neck: no adenopathy, no carotid bruit, no JVD, supple, symmetrical, trachea midline and thyroid not enlarged, symmetric, no tenderness/mass/nodules Lymph nodes: Cervical, supraclavicular, and axillary nodes normal. Resp: clear to auscultation bilaterally Cardio: regular rate and rhythm, S1, S2  normal, no murmur, click, rub or gallop GI: soft, non-tender; bowel sounds normal; no masses,  no organomegaly Extremities: extremities normal, atraumatic, no cyanosis or edema Neurologic: Alert and oriented X 3, normal strength and tone. Normal symmetric reflexes. Normal coordination and gait  ECOG PERFORMANCE STATUS: 1 - Symptomatic but completely ambulatory  Blood pressure 114/67, pulse 111, temperature 97.8 F (36.6 C), temperature source Oral, resp. rate 18, height 5\' 10"  (1.778 m), weight 202 lb 11.2 oz (91.944 kg), SpO2 98.00%.  LABORATORY DATA: Lab Results  Component Value Date   WBC 6.8 06/07/2013   HGB 9.0* 06/07/2013   HCT 26.9* 06/07/2013   MCV 91.2 06/07/2013   PLT 76* 06/07/2013      Chemistry      Component Value Date/Time   NA 135* 06/03/2013 0901   NA 135 05/06/2013 0407   K 4.1 06/03/2013 0901   K 3.3* 05/06/2013 0407   CL 99 05/06/2013 0407   CO2 23 06/03/2013 0901   CO2 26 05/06/2013 0407   BUN 21.8 06/03/2013 0901   BUN 20 05/06/2013 0407   CREATININE 1.5* 06/03/2013 0901   CREATININE 1.25 05/06/2013 0407   CREATININE 1.46* 12/10/2010 0843      Component Value Date/Time   CALCIUM 9.7 06/03/2013 0901   CALCIUM 8.9 05/06/2013 0407   ALKPHOS 138 06/03/2013 0901   ALKPHOS 102 05/05/2013 0405   AST 10 06/03/2013 0901   AST 17 05/05/2013 0405   ALT 11 06/03/2013 0901   ALT 14 05/05/2013 0405   BILITOT 0.52 06/03/2013 0901   BILITOT 0.6 05/05/2013 0405       RADIOGRAPHIC STUDIES: No results found.  ASSESSMENT/PLAN: This is a very pleasant 58 years old white male with limited stage small cell lung cancer currently undergoing systemic chemotherapy concurrent with radiation, status post 4 cycles. The patient is tolerating his chemotherapy fairly well with no significant adverse effects.  His last CT scan of the chest showed significant improvement in his disease. I discussed the scan results and showed the images to the patient and his niece. He completed 4 cycles  of his systemic chemotherapy. The patient has significant fatigue and weakness in his platelets count are still low. I recommended for the patient to discontinue his treatment at this point. He will continue on observation with repeat CT scan of the chest and head in 2 months for restaging of his disease. He was advised to call immediately if she has any concerning symptoms in the interval.  All questions were answered. The patient knows to  call the clinic with any problems, questions or concerns. We can certainly see the patient much sooner if necessary.  I spent 15 minutes counseling the patient face to face. The total time spent in the appointment was 25 minutes.  Disclaimer: This note was dictated with voice recognition software. Similar sounding words can inadvertently be transcribed and may not be corrected upon review.   Eilleen Kempf., MD 06/07/2013

## 2013-06-07 NOTE — Patient Instructions (Signed)
Follow up visit in 2 months with repeat CT scan of the head and chest.

## 2013-06-08 ENCOUNTER — Ambulatory Visit: Payer: Self-pay

## 2013-06-09 ENCOUNTER — Ambulatory Visit: Payer: Self-pay

## 2013-06-10 ENCOUNTER — Ambulatory Visit: Payer: Self-pay

## 2013-06-14 ENCOUNTER — Other Ambulatory Visit: Payer: Self-pay

## 2013-06-20 ENCOUNTER — Telehealth: Payer: Self-pay

## 2013-06-20 NOTE — Telephone Encounter (Signed)
Patient called stating he is just not feeling well.Still difficult for me to eat.Pain not controlled."II just want to see doctor."If approved by Dr.Wentworth will try to get in on 06/21/12 otherwise schedule for Thursday afternoon follow up.

## 2013-06-21 ENCOUNTER — Other Ambulatory Visit: Payer: Self-pay | Admitting: Radiation Oncology

## 2013-06-21 ENCOUNTER — Telehealth: Payer: Self-pay

## 2013-06-21 ENCOUNTER — Other Ambulatory Visit: Payer: Self-pay

## 2013-06-21 ENCOUNTER — Encounter: Payer: Self-pay | Admitting: Physician Assistant

## 2013-06-21 ENCOUNTER — Ambulatory Visit: Payer: Self-pay | Admitting: Internal Medicine

## 2013-06-21 DIAGNOSIS — C3490 Malignant neoplasm of unspecified part of unspecified bronchus or lung: Secondary | ICD-10-CM

## 2013-06-21 MED ORDER — HYDROCODONE-ACETAMINOPHEN 7.5-325 MG/15ML PO SOLN: 10.0000 mL | Freq: Four times a day (QID) | ORAL | Status: DC | PRN

## 2013-06-21 NOTE — Telephone Encounter (Signed)
Patient notified that he needs GI referral and that we will call him back when we get that scheduled and then to return to see Dr.Wentworth a week later.Informed patient that script for hycet ready for pick up in radiation oncology nursing area.

## 2013-06-24 ENCOUNTER — Ambulatory Visit (INDEPENDENT_AMBULATORY_CARE_PROVIDER_SITE_OTHER): Payer: BC Managed Care – PPO | Admitting: Physician Assistant

## 2013-06-24 ENCOUNTER — Encounter: Payer: Self-pay | Admitting: Physician Assistant

## 2013-06-24 VITALS — BP 100/60 | HR 91 | Ht 70.0 in | Wt 208.0 lb

## 2013-06-24 DIAGNOSIS — K208 Other esophagitis without bleeding: Secondary | ICD-10-CM

## 2013-06-24 DIAGNOSIS — R1310 Dysphagia, unspecified: Secondary | ICD-10-CM

## 2013-06-24 MED ORDER — MAGIC MOUTHWASH W/LIDOCAINE: 5.0000 mL | Freq: Four times a day (QID) | ORAL | Status: DC | PRN

## 2013-06-24 NOTE — Patient Instructions (Signed)
You have been scheduled for an endoscopy with propofol. Please follow written instructions given to you at your visit today. If you use inhalers (even only as needed), please bring them with you on the day of your procedure.  We sent refills on the Magic Mouthwash. Take 5 cc by mouth and swish and swallow.  We have given you samples to take 1 tab daily in the Am.

## 2013-06-24 NOTE — Progress Notes (Signed)
Reviewed and agree with management.  Let's obtain a barium swallow prior to EGD Herbie Baltimore D. Deatra Ina, M.D., [facility]

## 2013-06-24 NOTE — Progress Notes (Signed)
Subjective:    Patient ID: [name], male    DOB: [birth year], 58 y.o.   MRN: [id number]  HPI  [name] is a pleasant 58 year old white male known to Dr. Deatra Ina. He has history of coronary artery disease is status post MIs and has had several interventions. Also with history of congestive heart failure ,COPD ,and diabetes mellitus. Unfortunately he was diagnosed with a small cell lung cancer in the fall of 2014, stage IIIa and has just completed a course of chemotherapy and radiation. He did have problems with odynophagia and dysphagia in December of 2014 and underwent an endoscopy at which point he was hospitalized. This was done per Dr. 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 He was not dilated. He comes in today to the office weight down about 40 pounds since his diagnosis. He says he is tolerating full liquids and trying to eat anything with protein at this point to stabilize his weight. He says really his swallowing has never improved. Over the past month he has had a "hot poker" feeling in the center of his chest which is worse with by mouth intake. He had been using Magic mouthwash with lidocaine with some success but was just given a prescription for hydrocodone which she says is definitely helping. He is not having any episodes of regurgitation the rarely tries any solid food. He denies any abdominal pain. He has also just had a  So Diflucan do to complaints of dysphagia and odynophagia. He is not on a PPI     Review of Systems  Constitutional: Positive for unexpected weight change.  HENT: Positive for trouble swallowing.   Eyes: Negative.   Cardiovascular: Positive for chest pain.  Gastrointestinal: Negative.   Endocrine: Negative.   Genitourinary: Negative.   Musculoskeletal: Negative.   Allergic/Immunologic: Negative.   Neurological: Negative.   Hematological:  Negative.   Psychiatric/Behavioral: Negative.    Outpatient Prescriptions Prior to Visit  Medication Sig Dispense Refill  . albuterol (PROAIR HFA) 108 (90 BASE) MCG/ACT inhaler Inhale 2 puffs into the lungs every 6 (six) hours as needed for wheezing or shortness of breath.      Marland Kitchen albuterol (PROVENTIL) (2.5 MG/3ML) 0.083% nebulizer solution Take 3 mLs (2.5 mg total) by nebulization 5 (five) times daily as needed for wheezing or shortness of breath.  75 mL  12  . atorvastatin (LIPITOR) 40 MG tablet TAKE  ONE TABLET BY MOUTH NIGHTLY AT BEDTIME  30 tablet  0  . CARAFATE 1 GM/10ML suspension TAKE TWO TEASPOONFULS BY MOUTH 4 TIMES DAILY WITH MEALS AND ATBEDTIME.  420 mL  2  . diltiazem (CARDIZEM CD) 240 MG 24 hr capsule Take 1 capsule (240 mg total) by mouth daily.  30 capsule  2  . fluconazole (DIFLUCAN) 10 MG/ML suspension Take 10 mLs (100 mg total) by mouth daily.  70 mL  0  . Fluticasone Furoate-Vilanterol (BREO ELLIPTA) 100-25 MCG/INH AEPB Inhale 1 puff into the lungs 2 (two) times daily.  28 each  1  . glipiZIDE (GLUCOTROL) 10 MG tablet Take 10 mg by mouth daily.      Marland Kitchen HYDROcodone-acetaminophen (HYCET) 7.5-325 mg/15 ml solution Take 10 mLs by mouth 4 (four) times daily as needed for moderate pain.  473 mL  0  . isosorbide mononitrate (IMDUR) 60 MG 24 hr tablet Take 1 tablet (60 mg total) by mouth daily.  30 tablet  8  . metoprolol tartrate (LOPRESSOR) 25 MG tablet Take 1 tablet (25 mg total) by mouth 2 (two) times daily.  60 tablet  2  . nitroGLYCERIN (NITROSTAT) 0.4 MG SL tablet Place 0.4 mg under the tongue every 5 (five) minutes as needed for chest pain. May repeat up to 3 doses      . ondansetron (ZOFRAN-ODT) 4 MG disintegrating tablet DISSOLVE ONE TABLET IN MOUTH EVERY EIGHT HOURS AS NEEDED NAUSEA ANDVOMITING  20 tablet  0  . pantoprazole (PROTONIX) 40 MG tablet Take 1 tablet (40 mg total) by mouth 2 (two) times daily.  60 tablet  2  . risperiDONE (RISPERDAL) 1 MG tablet Take 1 tablet (1 mg  total) by mouth 2 (two) times daily.  60 tablet  2  . Alum & Mag Hydroxide-Simeth (MAGIC MOUTHWASH W/LIDOCAINE) SOLN Take 5 mLs by mouth 4 (four) times daily as needed for mouth pain.  500 mL  2   No facility-administered medications prior to visit.   Allergies  Allergen Reactions  . Erythromycin Other (See Comments)    Reaction while in hospital - pt doesn't know reaction  . Fentanyl Other (See Comments)    delirium  . Penicillins Other (See Comments)    Childhood reaction - unknown   Patient Active Problem List   Diagnosis Date Noted  . Stricture and stenosis of esophagus 05/04/2013  . 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 Dysphagia, unspecified(787.20) 05/03/2013  . Delirium due to multiple etiologies 05/02/2013  . Protein-calorie malnutrition, severe 05/02/2013  . Febrile neutropenia 04/29/2013  . Pancytopenia due to chemotherapy 04/29/2013  . Atrial fibrillation with RVR 04/28/2013  . A-fib 04/28/2013  . Malignant neoplasm of lower lobe of left lung 03/04/2013  . Cancer   . Small cell carcinoma of lung 03/03/2013  . Lung mass 02/15/2013  . Stable angina 08/02/2012  . Metabolic syndrome 02/25/5101  . Severe chronic obstructive pulmonary disease 04/22/2011  . COPD with exacerbation 04/01/2011  . Neuropathic pain of chest 10/22/2010  . TOBACCO USE, QUIT 11/14/2009  . DM 09/12/2009  . HYPERLIPIDEMIA-MIXED 11/03/2008  . CAD, NATIVE VESSEL 11/03/2008  . HYPERTENSION, UNSPECIFIED 11/02/2008   History  Substance Use Topics  . Smoking status: Former Smoker -- 1.00 packs/day for 40 years    Types: Cigarettes    Quit date: 09/09/2009  . Smokeless tobacco: Not on file  . Alcohol Use: No     Comment: rarely   family history includes Cancer in his maternal grandfather and maternal uncle; Diabetes in his mother; Stroke in his other.     Objective:   Physical Exam   well-developed white male in no acute distress, Blood pressure 100/60 pulse 90 one height 5 foot 10 weight 208.  HEENT; nontraumatic normocephalic EOMI PERRLA sclera anicteric, Supple; no JVD, Cardiovascular ;regular rate and rhythm with S1-S2 no murmur or gallop, Pulmonary; clear bilaterally, Abdomen; large soft nontender nondistended bowel sounds are active there is no palpable mass or hepatomegaly, Extremities; no clubbing cyanosis or edema skin warm and dry, Psych; mood and affect normal and appropriate        Assessment & Plan:  #4  58 year old male with small cell lung CA stage III a status post radiation and chemotherapy completed January 2015 with persistent complaints of dysphagia and odynophagia. 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 No improvement since that time and using a narcotics now due to odynophagia. 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 Deatra Ina with possible esophageal dilation-procedure was discussed in detail with the patient and he is agreeable to proceed.  Plan;

## 2013-06-25 ENCOUNTER — Other Ambulatory Visit: Payer: Self-pay | Admitting: Internal Medicine

## 2013-06-27 ENCOUNTER — Other Ambulatory Visit: Payer: Self-pay

## 2013-06-27 ENCOUNTER — Encounter: Payer: Self-pay | Admitting: Gastroenterology

## 2013-06-27 ENCOUNTER — Telehealth: Payer: Self-pay

## 2013-06-27 DIAGNOSIS — R1310 Dysphagia, unspecified: Secondary | ICD-10-CM

## 2013-06-27 NOTE — Telephone Encounter (Signed)
Message copied by Algernon Huxley on Mon Jun 27, 2013  1:08 PM ------      Message from: Nicoletta Ba S      Created: Fri Jun 24, 2013  4:37 PM      Regarding: Ba swallow       [name]- can you please set him up for Barium swallow next week- he is already schedule for EGD -not sure of date- let him know Dr. Deatra Ina wants it done prior to dilating him, thanks      ----- Message -----         From: Inda Castle, MD         Sent: 06/24/2013   3:34 PM           To: Alfredia Ferguson, PA-C                        ----- Message -----         From: Alfredia Ferguson, PA-C         Sent: 06/24/2013  12:18 PM           To: Inda Castle, MD                   ------

## 2013-06-27 NOTE — Telephone Encounter (Signed)
Pt scheduled for barium swallow at [facility] 07/01/13 @9 :30am, pt to arrive there at 9:15am. Pt to be NPO after midnight. Pt aware of appt.

## 2013-06-29 ENCOUNTER — Other Ambulatory Visit: Payer: Self-pay

## 2013-06-29 ENCOUNTER — Telehealth: Payer: Self-pay

## 2013-06-29 ENCOUNTER — Telehealth: Payer: Self-pay | Admitting: Gastroenterology

## 2013-06-29 NOTE — Telephone Encounter (Signed)
Patient called this afternoon stating he was still having a hard time with eating and didn't think he could wait until 07/15/13 to have EGD done.i called Dr.Kaplan's office to leave mesaage with his nurse and [name] did return my call and was able to move up EGD to 07/06/13.Patient was informed.[name] knows that if he has increase in problems to go to emergency department.Spoke with Dr.Wentworth and she is in agreement.Patient thankful.Reminded of barium swallow scheduled for Friday 07/01/13.

## 2013-06-29 NOTE — Telephone Encounter (Signed)
Pts EGD rescheduled to 07/06/13@2 :30pm. Pt to arrive at 1:30pm. Pt to have clear liquids and be NPO after 11:30am. Pt aware.

## 2013-06-30 ENCOUNTER — Other Ambulatory Visit: Payer: Self-pay | Admitting: *Deleted

## 2013-06-30 MED ORDER — ALBUTEROL SULFATE HFA 108 (90 BASE) MCG/ACT IN AERS: INHALATION_SPRAY | RESPIRATORY_TRACT | Status: DC

## 2013-07-01 ENCOUNTER — Ambulatory Visit: Payer: BC Managed Care – PPO | Admitting: Radiation Oncology

## 2013-07-01 ENCOUNTER — Ambulatory Visit (HOSPITAL_COMMUNITY)
Admission: RE | Admit: 2013-07-01 | Discharge: 2013-07-01 | Disposition: A | Discharge status: Home / Self Care | Payer: BC Managed Care – PPO | Source: Ambulatory Visit | Attending: Gastroenterology | Admitting: Gastroenterology

## 2013-07-01 DIAGNOSIS — Z85118 Personal history of other malignant neoplasm of bronchus and lung: Secondary | ICD-10-CM | POA: Insufficient documentation

## 2013-07-01 DIAGNOSIS — R1310 Dysphagia, unspecified: Secondary | ICD-10-CM | POA: Insufficient documentation

## 2013-07-01 DIAGNOSIS — Z923 Personal history of irradiation: Secondary | ICD-10-CM | POA: Insufficient documentation

## 2013-07-01 DIAGNOSIS — R070 Pain in throat: Secondary | ICD-10-CM | POA: Insufficient documentation

## 2013-07-01 DIAGNOSIS — K222 Esophageal obstruction: Secondary | ICD-10-CM | POA: Insufficient documentation

## 2013-07-01 DIAGNOSIS — K224 Dyskinesia of esophagus: Secondary | ICD-10-CM | POA: Insufficient documentation

## 2013-07-05 ENCOUNTER — Other Ambulatory Visit: Payer: Self-pay

## 2013-07-05 DIAGNOSIS — R1319 Other dysphagia: Secondary | ICD-10-CM

## 2013-07-06 ENCOUNTER — Encounter (HOSPITAL_COMMUNITY): Payer: Self-pay | Admitting: *Deleted

## 2013-07-06 ENCOUNTER — Ambulatory Visit (HOSPITAL_COMMUNITY)
Admission: RE | Admit: 2013-07-06 | Discharge: 2013-07-06 | Disposition: A | Discharge status: Home / Self Care | Payer: BC Managed Care – PPO | Source: Ambulatory Visit | Attending: Gastroenterology | Admitting: Gastroenterology

## 2013-07-06 ENCOUNTER — Ambulatory Visit (HOSPITAL_COMMUNITY): Payer: BC Managed Care – PPO

## 2013-07-06 ENCOUNTER — Telehealth: Payer: Self-pay

## 2013-07-06 ENCOUNTER — Other Ambulatory Visit: Payer: Self-pay

## 2013-07-06 ENCOUNTER — Encounter: Payer: Self-pay | Admitting: Gastroenterology

## 2013-07-06 ENCOUNTER — Encounter (HOSPITAL_COMMUNITY)
Admission: RE | Disposition: A | Discharge status: Home / Self Care | Payer: Self-pay | Source: Ambulatory Visit | Attending: Gastroenterology

## 2013-07-06 ENCOUNTER — Other Ambulatory Visit: Payer: Self-pay | Admitting: Gastroenterology

## 2013-07-06 DIAGNOSIS — K222 Esophageal obstruction: Secondary | ICD-10-CM

## 2013-07-06 DIAGNOSIS — R1319 Other dysphagia: Secondary | ICD-10-CM

## 2013-07-06 DIAGNOSIS — Z87891 Personal history of nicotine dependence: Secondary | ICD-10-CM | POA: Insufficient documentation

## 2013-07-06 DIAGNOSIS — E119 Type 2 diabetes mellitus without complications: Secondary | ICD-10-CM | POA: Insufficient documentation

## 2013-07-06 DIAGNOSIS — Z79899 Other long term (current) drug therapy: Secondary | ICD-10-CM | POA: Insufficient documentation

## 2013-07-06 DIAGNOSIS — E43 Unspecified severe protein-calorie malnutrition: Secondary | ICD-10-CM | POA: Insufficient documentation

## 2013-07-06 DIAGNOSIS — Y842 Radiological procedure and radiotherapy as the cause of abnormal reaction of the patient, or of later complication, without mention of misadventure at the time of the procedure: Secondary | ICD-10-CM | POA: Insufficient documentation

## 2013-07-06 DIAGNOSIS — J449 Chronic obstructive pulmonary disease, unspecified: Secondary | ICD-10-CM | POA: Insufficient documentation

## 2013-07-06 DIAGNOSIS — D61810 Antineoplastic chemotherapy induced pancytopenia: Secondary | ICD-10-CM | POA: Insufficient documentation

## 2013-07-06 DIAGNOSIS — I2510 Atherosclerotic heart disease of native coronary artery without angina pectoris: Secondary | ICD-10-CM | POA: Insufficient documentation

## 2013-07-06 DIAGNOSIS — I4891 Unspecified atrial fibrillation: Secondary | ICD-10-CM | POA: Insufficient documentation

## 2013-07-06 DIAGNOSIS — I10 Essential (primary) hypertension: Secondary | ICD-10-CM | POA: Insufficient documentation

## 2013-07-06 DIAGNOSIS — C3430 Malignant neoplasm of lower lobe, unspecified bronchus or lung: Secondary | ICD-10-CM | POA: Insufficient documentation

## 2013-07-06 DIAGNOSIS — E782 Mixed hyperlipidemia: Secondary | ICD-10-CM | POA: Insufficient documentation

## 2013-07-06 DIAGNOSIS — T451X5A Adverse effect of antineoplastic and immunosuppressive drugs, initial encounter: Secondary | ICD-10-CM | POA: Insufficient documentation

## 2013-07-06 DIAGNOSIS — J4489 Other specified chronic obstructive pulmonary disease: Secondary | ICD-10-CM | POA: Insufficient documentation

## 2013-07-06 DIAGNOSIS — I252 Old myocardial infarction: Secondary | ICD-10-CM | POA: Insufficient documentation

## 2013-07-06 HISTORY — PX: SAVORY DILATION: SHX5439

## 2013-07-06 HISTORY — PX: ESOPHAGOGASTRODUODENOSCOPY: SHX5428

## 2013-07-06 LAB — GLUCOSE, CAPILLARY: Glucose-Capillary: 129 mg/dL — ABNORMAL HIGH (ref 70–99)

## 2013-07-06 SURGERY — EGD (ESOPHAGOGASTRODUODENOSCOPY)
Anesthesia: Moderate Sedation

## 2013-07-06 MED ORDER — DIPHENHYDRAMINE HCL 50 MG/ML IJ SOLN
INTRAMUSCULAR | Status: AC
  Filled 2013-07-06: qty 1

## 2013-07-06 MED ORDER — BUTAMBEN-TETRACAINE-BENZOCAINE 2-2-14 % EX AERO
INHALATION_SPRAY | CUTANEOUS | Status: DC | PRN
  Administered 2013-07-06: 1 via TOPICAL

## 2013-07-06 MED ORDER — FENTANYL CITRATE 0.05 MG/ML IJ SOLN
INTRAMUSCULAR | Status: DC | PRN
  Administered 2013-07-06 (×2): 25 ug via INTRAVENOUS

## 2013-07-06 MED ORDER — SODIUM CHLORIDE 0.9 % IV SOLN
INTRAVENOUS | Status: DC
Start: 2013-07-06 — End: 2013-07-06
  Administered 2013-07-06: 500 mL via INTRAVENOUS

## 2013-07-06 MED ORDER — DIPHENHYDRAMINE HCL 50 MG/ML IJ SOLN
INTRAMUSCULAR | Status: DC | PRN
  Administered 2013-07-06: 25 mg via INTRAVENOUS

## 2013-07-06 MED ORDER — MIDAZOLAM HCL 10 MG/2ML IJ SOLN
INTRAMUSCULAR | Status: DC | PRN
  Administered 2013-07-06 (×2): 2 mg via INTRAVENOUS
  Administered 2013-07-06: 1 mg via INTRAVENOUS

## 2013-07-06 MED ORDER — MIDAZOLAM HCL 10 MG/2ML IJ SOLN
INTRAMUSCULAR | Status: AC
  Filled 2013-07-06: qty 2

## 2013-07-06 MED ORDER — FENTANYL CITRATE 0.05 MG/ML IJ SOLN
INTRAMUSCULAR | Status: AC
  Filled 2013-07-06: qty 2

## 2013-07-06 MED ORDER — LIDOCAINE VISCOUS 2 % MT SOLN: 20.0000 mL | OROMUCOSAL | Status: DC | PRN

## 2013-07-06 NOTE — Discharge Instructions (Signed)
Esophagogastroduodenoscopy Care After Refer to this sheet in the next few weeks. These instructions provide you with information on caring for yourself after your procedure. Your caregiver may also give you more specific instructions. Your treatment has been planned according to current medical practices, but problems sometimes occur. Call your caregiver if you have any problems or questions after your procedure.  HOME CARE INSTRUCTIONS  Do not eat or drink anything until the numbing medicine (local anesthetic) has worn off and your gag reflex has returned. You will know that the local anesthetic has worn off when you can swallow comfortably.  Do not drive for 12 hours after the procedure or as directed by your caregiver.  Only take medicines as directed by your caregiver. SEEK MEDICAL CARE IF:   You cannot stop coughing.  You are not urinating at all or less than usual. SEEK IMMEDIATE MEDICAL CARE IF:  You have difficulty swallowing.  You cannot eat or drink.  You have worsening throat or chest pain.  You have dizziness, lightheadedness, or you faint.  You have nausea or vomiting.  You have chills.  You have a fever.  You have severe abdominal pain.  You have black, tarry, or bloody stools. Document Released: 04/14/2012 Document Reviewed: 04/14/2012 [facility] Patient Information 2014 [place], [place].

## 2013-07-06 NOTE — Telephone Encounter (Signed)
Pt scheduled for EGD with savary dil/mac at [facility] 07/14/13 pt to arrive there at 6:30am, procedure at 7:30am. Pt to be NPO after midnight. Pt aware of appt date and time.

## 2013-07-06 NOTE — Op Note (Signed)
[facility] [place], [address]   ENDOSCOPY PROCEDURE REPORT  PATIENT: [name], [name]  MR#: [id number] BIRTHDATE: [birth year] , 57  yrs. old GENDER: Male ENDOSCOPIST: Inda Castle, MD ASSISTANT:   Mahala Menghini, technician Hilma Favors, RN REFERRED BY: PROCEDURE DATE:  07/06/2013 PROCEDURE:   EGD with dilatation over guidewire ASA CLASS:   Class III INDICATIONS:dysphagia. MEDICATIONS: These medications were titrated to patient response per physician's verbal order, Versed 5 mg IV, Fentanyl 50 mcg IV, and Benadryl 25 mg IV TOPICAL ANESTHETIC:   Cetacaine Spray  DESCRIPTION OF PROCEDURE:   After the risks benefits and alternatives of the procedure were thoroughly explained, informed consent was obtained.  The     endoscope was introduced through the mouth  and advanced to the third portion of the duodenum ,      The instrument was slowly withdrawn as the mucosa was carefully examined.    A high-grade stricture was seen at approximately 20 cm in the esophagus.  The 9 mm scope could not traverse the stricture.  A guidewire was placed through the stricture and into the stomach under fluoroscopic guidance. Number is 12/18/08/12 mm Savary dilators were passed with mild resistance.  There was minimal heme on the dilator tip. Following dilatation the scope was passed into the stomach.  A radiation-induced stricture of the esophagus was noted beginning at 25 cm from the incisors and extending at least 5-6 cm.  A nonobstructing stricture was also seen at the GE junction. Remainder of exam was normal.   A high-grade stricture was seen at approximately 20 cm in the esophagus.  The 9 mm scope could not traverse the stricture.  A guidewire was placed through the stricture and into the stomach under fluoroscopic guidance. Number is 12/18/08/12 mm Savary dilators were passed with mild resistance.  There was minimal heme on the dilator tip. Following dilatation  the scope was passed into the stomach.  A radiation-induced stricture of the esophagus was noted beginning at 25 cm from the incisors and extending at least 5-6 cm.  A nonobstructing stricture was also seen at the GE junction. Remainder of exam was normal.     Dilation was then performed at the    COMPLICATIONS: There were no complications. ENDOSCOPIC IMPRESSION: 1.   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  nonobstructing stricture GE junction  RECOMMENDATIONS: followup endoscopy with dilatation in 7-10 days  eSigned:  Inda Castle, MD 07/06/2013 8:23 AM  QM:GNOIBB [name] Section, MD  PATIENT NAME:  [name], [name] MR#: [id number]

## 2013-07-06 NOTE — Telephone Encounter (Signed)
Message copied by Algernon Huxley on Wed Jul 06, 2013  3:47 PM ------      Message from: Erskine Emery D      Created: Wed Jul 06, 2013  3:28 PM       Yes, or I can do it on an afternoon off, at midday.      ----- Message -----         From: Maury Dus, RN         Sent: 07/06/2013   2:52 PM           To: Inda Castle, MD            Dr. Deatra Ina would you be willing to do Mr. Monnig one morning at 7:30am like you did today? I cannot find an afternoon that he can be done with the scheduling restrictions.            Thanks,      [name]      ----- Message -----         From: Inda Castle, MD         Sent: 07/06/2013   8:25 AM           To: Maury Dus, RN            Please schedule EGD with Savary dilation, fluoroscopy, with MAC in 7-10 days             ------

## 2013-07-07 ENCOUNTER — Ambulatory Visit: Payer: BC Managed Care – PPO | Admitting: Radiation Oncology

## 2013-07-08 ENCOUNTER — Encounter (HOSPITAL_COMMUNITY): Payer: Self-pay | Admitting: Gastroenterology

## 2013-07-12 ENCOUNTER — Telehealth: Payer: Self-pay

## 2013-07-12 NOTE — Telephone Encounter (Signed)
Refill on mmw approved per dr.wentworth via [facility], [place].Iona Beard rph. For 500 mls.

## 2013-07-12 NOTE — Telephone Encounter (Signed)
Patient called for mmw refill, request sent to [place].Wants sent to [facility] in [place].

## 2013-07-14 ENCOUNTER — Ambulatory Visit (HOSPITAL_COMMUNITY)
Admission: RE | Admit: 2013-07-14 | Discharge: 2013-07-14 | Disposition: A | Discharge status: Home / Self Care | Payer: BC Managed Care – PPO | Source: Ambulatory Visit | Attending: Gastroenterology | Admitting: Gastroenterology

## 2013-07-14 ENCOUNTER — Encounter (HOSPITAL_COMMUNITY): Payer: BC Managed Care – PPO | Admitting: *Deleted

## 2013-07-14 ENCOUNTER — Ambulatory Visit (HOSPITAL_COMMUNITY): Payer: BC Managed Care – PPO

## 2013-07-14 ENCOUNTER — Ambulatory Visit: Payer: BC Managed Care – PPO | Admitting: Radiation Oncology

## 2013-07-14 ENCOUNTER — Ambulatory Visit (HOSPITAL_COMMUNITY): Payer: BC Managed Care – PPO | Admitting: *Deleted

## 2013-07-14 ENCOUNTER — Encounter (HOSPITAL_COMMUNITY)
Admission: RE | Disposition: A | Discharge status: Home / Self Care | Payer: Self-pay | Source: Ambulatory Visit | Attending: Gastroenterology

## 2013-07-14 ENCOUNTER — Encounter (HOSPITAL_COMMUNITY): Payer: Self-pay | Admitting: *Deleted

## 2013-07-14 DIAGNOSIS — J449 Chronic obstructive pulmonary disease, unspecified: Secondary | ICD-10-CM | POA: Insufficient documentation

## 2013-07-14 DIAGNOSIS — E119 Type 2 diabetes mellitus without complications: Secondary | ICD-10-CM | POA: Insufficient documentation

## 2013-07-14 DIAGNOSIS — J4489 Other specified chronic obstructive pulmonary disease: Secondary | ICD-10-CM | POA: Insufficient documentation

## 2013-07-14 DIAGNOSIS — Z87891 Personal history of nicotine dependence: Secondary | ICD-10-CM | POA: Insufficient documentation

## 2013-07-14 DIAGNOSIS — Z8673 Personal history of transient ischemic attack (TIA), and cerebral infarction without residual deficits: Secondary | ICD-10-CM | POA: Insufficient documentation

## 2013-07-14 DIAGNOSIS — K319 Disease of stomach and duodenum, unspecified: Secondary | ICD-10-CM | POA: Insufficient documentation

## 2013-07-14 DIAGNOSIS — N189 Chronic kidney disease, unspecified: Secondary | ICD-10-CM | POA: Insufficient documentation

## 2013-07-14 DIAGNOSIS — Z85118 Personal history of other malignant neoplasm of bronchus and lung: Secondary | ICD-10-CM | POA: Insufficient documentation

## 2013-07-14 DIAGNOSIS — I2510 Atherosclerotic heart disease of native coronary artery without angina pectoris: Secondary | ICD-10-CM | POA: Insufficient documentation

## 2013-07-14 DIAGNOSIS — I129 Hypertensive chronic kidney disease with stage 1 through stage 4 chronic kidney disease, or unspecified chronic kidney disease: Secondary | ICD-10-CM | POA: Insufficient documentation

## 2013-07-14 DIAGNOSIS — T66XXXS Radiation sickness, unspecified, sequela: Secondary | ICD-10-CM | POA: Insufficient documentation

## 2013-07-14 DIAGNOSIS — E785 Hyperlipidemia, unspecified: Secondary | ICD-10-CM | POA: Insufficient documentation

## 2013-07-14 DIAGNOSIS — Y842 Radiological procedure and radiotherapy as the cause of abnormal reaction of the patient, or of later complication, without mention of misadventure at the time of the procedure: Secondary | ICD-10-CM | POA: Insufficient documentation

## 2013-07-14 DIAGNOSIS — I509 Heart failure, unspecified: Secondary | ICD-10-CM | POA: Insufficient documentation

## 2013-07-14 DIAGNOSIS — I252 Old myocardial infarction: Secondary | ICD-10-CM | POA: Insufficient documentation

## 2013-07-14 DIAGNOSIS — K222 Esophageal obstruction: Secondary | ICD-10-CM | POA: Insufficient documentation

## 2013-07-14 HISTORY — PX: SAVORY DILATION: SHX5439

## 2013-07-14 HISTORY — PX: ESOPHAGOGASTRODUODENOSCOPY: SHX5428

## 2013-07-14 LAB — GLUCOSE, CAPILLARY: Glucose-Capillary: 147 mg/dL — ABNORMAL HIGH (ref 70–99)

## 2013-07-14 SURGERY — EGD (ESOPHAGOGASTRODUODENOSCOPY)
Anesthesia: Monitor Anesthesia Care

## 2013-07-14 MED ORDER — PROPOFOL 10 MG/ML IV BOLUS
INTRAVENOUS | Status: AC
  Filled 2013-07-14: qty 20

## 2013-07-14 MED ORDER — MAGNESIUM HYDROXIDE 400 MG/5ML PO SUSP
15.0000 mL | Freq: Once | ORAL | Status: AC
  Administered 2013-07-14: 30 mL via ORAL
  Filled 2013-07-14: qty 30

## 2013-07-14 MED ORDER — LIDOCAINE HCL 1 % IJ SOLN
INTRAMUSCULAR | Status: DC | PRN
  Administered 2013-07-14: 50 mg via INTRADERMAL

## 2013-07-14 MED ORDER — ONDANSETRON HCL 4 MG/2ML IJ SOLN
INTRAMUSCULAR | Status: AC
  Filled 2013-07-14: qty 2

## 2013-07-14 MED ORDER — SODIUM CHLORIDE 0.9 % IV SOLN: INTRAVENOUS | Status: DC

## 2013-07-14 MED ORDER — MIDAZOLAM HCL 2 MG/2ML IJ SOLN
INTRAMUSCULAR | Status: AC
  Filled 2013-07-14: qty 2

## 2013-07-14 MED ORDER — SODIUM CHLORIDE 0.9 % IV SOLN
INTRAVENOUS | Status: DC | PRN
  Administered 2013-07-14: 07:00:00 via INTRAVENOUS

## 2013-07-14 MED ORDER — PROPOFOL INFUSION 10 MG/ML OPTIME
INTRAVENOUS | Status: DC | PRN
  Administered 2013-07-14: 160 ug/kg/min via INTRAVENOUS

## 2013-07-14 MED ORDER — LIDOCAINE HCL (CARDIAC) 20 MG/ML IV SOLN
INTRAVENOUS | Status: AC
  Filled 2013-07-14: qty 5

## 2013-07-14 MED ORDER — MAGIC MOUTHWASH
10.0000 mL | Freq: Once | ORAL | Status: AC
  Administered 2013-07-14: 5 mL via ORAL
  Filled 2013-07-14: qty 10

## 2013-07-14 MED ORDER — MIDAZOLAM HCL 5 MG/5ML IJ SOLN
INTRAMUSCULAR | Status: DC | PRN
  Administered 2013-07-14 (×2): 1 mg via INTRAVENOUS

## 2013-07-14 MED ORDER — KETAMINE HCL 10 MG/ML IJ SOLN
INTRAMUSCULAR | Status: DC | PRN
  Administered 2013-07-14 (×2): 10 mg via INTRAVENOUS

## 2013-07-14 MED ORDER — PROMETHAZINE HCL 25 MG/ML IJ SOLN: 6.2500 mg | INTRAMUSCULAR | Status: DC | PRN

## 2013-07-14 NOTE — Anesthesia Preprocedure Evaluation (Signed)
Anesthesia Evaluation  Patient identified by MRN, date of birth, ID band Patient awake    Reviewed: Allergy & Precautions, H&P , NPO status , Patient's Chart, lab work & pertinent test results  Airway Mallampati: II TM Distance: >3 FB Neck ROM: Full    Dental no notable dental hx.    Pulmonary asthma , COPDformer smoker,  H/O Lung CA breath sounds clear to auscultation  Pulmonary exam normal       Cardiovascular hypertension, Pt. on medications + CAD, + Past MI, + Cardiac Stents and +CHF Rhythm:Regular Rate:Normal     Neuro/Psych CVA, No Residual Symptoms negative psych ROS   GI/Hepatic negative GI ROS, Neg liver ROS,   Endo/Other  negative endocrine ROSdiabetes  Renal/GU negative Renal ROS  negative genitourinary   Musculoskeletal negative musculoskeletal ROS (+)   Abdominal   Peds negative pediatric ROS (+)  Hematology negative hematology ROS (+)   Anesthesia Other Findings   Reproductive/Obstetrics negative OB ROS                           Anesthesia Physical Anesthesia Plan  ASA: III  Anesthesia Plan: MAC   Post-op Pain Management:    Induction: Intravenous  Airway Management Planned: Nasal Cannula  Additional Equipment:   Intra-op Plan:   Post-operative Plan:   Informed Consent: I have reviewed the patients History and Physical, chart, labs and discussed the procedure including the risks, benefits and alternatives for the proposed anesthesia with the patient or authorized representative who has indicated his/her understanding and acceptance.   Dental advisory given  Plan Discussed with: CRNA and Surgeon  Anesthesia Plan Comments:         Anesthesia Quick Evaluation

## 2013-07-14 NOTE — H&P (Signed)
_                                                                                                                History of Present Illness: 58yo WM here for esophageal dilitation.  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    Past Medical History  Diagnosis Date  . CAD (coronary artery disease)     a. PCI 3/08 to OM2 and mid CFX; b.  NSTEMI 6/10:  Prior stents patent, 90% dCFX,  mRCA occl (old) with collats; EF 55% on LV-gram => Xience DES 2.5 x 23 mm to distal CFX;  c. abnl MV 12/13 => LHC 04/23/12:  pLAD 50%, oOM1 80%, long stented segment of the CFX extending into the PLOM with the PLOM totally occluded ostially, CFX 70% ISR, mCFX 80% ISR, pRCA chronically occluded. Med Rx planned  . Diabetes mellitus type II   . Hyperlipidemia   . COPD (chronic obstructive pulmonary disease)     Quit tobacco 09/2009; Gold Stage 2 with asthma component - FEV1 1.53 L/54%, 14% fev1 BD response, DLCO 54% - July 2011; MM genotype 01/07/10; unable to afford spiriva and unwilling to try ics due to prior renal failure: state to Dr. [name], Aug 2011; started on atrovent HFA fall 2011. no desturation on walk test Dec 2011  . CKD (chronic kidney disease)   . Obesity   . History of CVA (cerebrovascular accident) 2007    Without residual defecits  . 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 no further rx  . CHF (congestive heart failure)   . Myocardial infarction     x3  . Hypertension   . Asthma     as child  . Shortness of breath     constant/wears 2 liters  . Pneumonia     02/14/2013 in hospital  . Stroke     7 years- no problems     . Cancer     lung cancer-02/14/2013  . Blood transfusion without reported diagnosis    Past Surgical History  Procedure Laterality Date  . Wedge resection  03/28/2010  . Coronary stent placement  2009  . Cardiac catheterization  2009  . Endobronchial ultrasound Bilateral 02/28/2013    Procedure: ENDOBRONCHIAL ULTRASOUND;  Surgeon: Brand Males, MD;  Location: [facility] ENDOSCOPY;  Service: Cardiopulmonary;  Laterality: Bilateral;  . Esophagogastroduodenoscopy N/A 05/04/2013    Procedure: ESOPHAGOGASTRODUODENOSCOPY (EGD);  Surgeon: Inda Castle, MD;  Location: [facility] ENDOS[facility];  Service: Endoscopy;  Laterality: N/A;  . Esophagogastroduodenoscopy N/A 07/06/2013    Procedure: ESOPHAGOGASTRODUODENOSCOPY (EGD);  Surgeon: Inda Castle, MD;  Location: [facility]  ENDOSCOPY;  Service: Endoscopy;  Laterality: N/A;  . Savory dilation N/A 07/06/2013    Procedure: SAVORY DILATION;  Surgeon: Inda Castle, MD;  Location: [facility] ENDOSCOPY;  Service: Endoscopy;  Laterality: N/A;   family history includes Cancer in his maternal grandfather and maternal uncle; Diabetes in his mother; Stroke in his other. No current facility-administered medications for this encounter.   Facility-Administered Medications Ordered in Other Encounters  Medication Dose Route Frequency Provider Last Rate Last Dose  . 0.9 %  sodium chloride infusion    Continuous PRN Garrel Ridgel, CRNA       Allergies as of 07/06/2013 - Review Complete 07/06/2013  Allergen Reaction Noted  . Erythromycin Other (See Comments) 04/14/2012  . Fentanyl Other (See Comments) 05/10/2013  . Penicillins Other (See Comments)     reports that he quit smoking about 3 years ago. His smoking use included Cigarettes. He has a 40 pack-year smoking history. He does not have any smokeless tobacco history on file. He reports that he does not drink alcohol or use illicit drugs.     Review of Systems: Pertinent positive and negative review of systems were noted  in the above HPI section. All other review of systems were otherwise negative.  Vital signs were reviewed in today's medical record Physical Exam: General: Well developed , well nourished, no acute distress Skin: anicteric Head: Normocephalic and atraumatic Eyes:  sclerae anicteric, EOMI Ears: Normal auditory acuity Mouth: No deformity or lesions Neck: Supple, no masses or thyromegaly Lungs: Clear throughout to auscultation Heart: Regular rate and rhythm; no murmurs, rubs or bruits Abdomen: Soft, non tender and non distended. No masses, hepatosplenomegaly or hernias noted. Normal Bowel sounds Rectal:deferred Musculoskeletal: Symmetrical with no gross deformities  Skin: No lesions on visible extremities Pulses:  Normal pulses noted Extremities: No clubbing, cyanosis, edema or deformities noted Neurological: Alert oriented x 4, grossly nonfocal Cervical Nodes:  No significant cervical adenopathy Inguinal Nodes: No significant inguinal adenopathy Psychological:  Alert and cooperative. Normal mood and affect  See Assessment and Plan under Problem List  Impression-radiation induced proximal septal stricture  Plan upper endoscopy with Savary dilatation  Risks, alternatives, and complications of the procedure, including bleeding, perforation, and possible need for surgery, were explained to the patient.  Patient's questions were answered.

## 2013-07-14 NOTE — Addendum Note (Signed)
Addendum created 07/14/13 1216 by Garrel Ridgel, CRNA   Modules edited: Anesthesia Medication Administration

## 2013-07-14 NOTE — Op Note (Signed)
[facility] [place], [address]   ENDOSCOPY PROCEDURE REPORT  PATIENT: [name], [name]  MR#: [id number] BIRTHDATE: April 20, 1956 , 57  yrs. old GENDER: Male ENDOSCOPIST: Inda Castle, MD ASSISTANT: REFERRED KD:[id number] Julien Nordmann, M.D. PROCEDURE DATE:  07/14/2013 PROCEDURE:   EGD with dilatation over guidewire ASA CLASS:   Class III INDICATIONS:dysphagia.  dilation of a radiation-induced stricture MEDICATIONS: MAC sedation, administered by CRNA TOPICAL ANESTHETIC:  DESCRIPTION OF PROCEDURE:   After the risks benefits and alternatives of the procedure were thoroughly explained, informed consent was obtained.  The     endoscope was introduced through the mouth  and advanced to the second portion of the duodenum , The instrument was slowly withdrawn as the mucosa was carefully examined. No photographic images are available    A high-grade stricture was seen at approximately 22 cm from the incisors.  The 9 mm gastroscope could not traverse the stricture. A guidewire was placed fluoroscopically through the stricture into the stomach.  Numbers 10, 12 and 12.8 mm Savary dilators were passed under fluoroscopic guidance.  There was moderate resistance and minimal heme.  Following this the scope was passed through the stricture into the stomach. Again noted was a high-grade radiation stricture approximately 5 cm in length beginning at 22-23 cm from the incisors.  A nonobstructing stricture was seen at the GE junction.   A high-grade stricture was seen at approximately 22 cm from the incisors.  The 9 mm gastroscope could not traverse the stricture. A guidewire was placed fluoroscopically through the stricture into the stomach.  Numbers 10, 12 and 12.8 mm Savary dilators were passed under fluoroscopic guidance.  There was moderate resistance and minimal heme.  Following this the scope was passed through the stricture into the stomach. Again noted was a  high-grade radiation stricture approximately 5 cm in length beginning at 22-23 cm from the incisors.  A nonobstructing stricture was seen at the GE junction.   The remainder of the upper endoscopy exam was otherwise normal. Dilation was then performed at the    COMPLICATIONS: There were no complications. ENDOSCOPIC IMPRESSION: 1.   radiation induced stricture in the proximal esophagus-status post Savary dilation #2 nonobstructing stricture at the GE junction  RECOMMENDATIONS: repeat dilatation in approximately one week.-Sedation with propofol  eSigned:  Inda Castle, MD 07/14/2013 8:53 AM  CC:

## 2013-07-14 NOTE — Transfer of Care (Signed)
Immediate Anesthesia Transfer of Care Note  Patient: [name]  Procedure(s) Performed: Procedure(s): ESOPHAGOGASTRODUODENOSCOPY (EGD) (N/A) SAVORY DILATION (N/A)  Patient Location: PACU and Endoscopy Unit  Anesthesia Type:MAC  Level of Consciousness: alert , oriented and patient cooperative  Airway & Oxygen Therapy: Patient Spontanous Breathing and Patient connected to nasal cannula oxygen  Post-op Assessment: Report given to PACU RN, Post -op Vital signs reviewed and stable and Patient moving all extremities  Post vital signs: Reviewed and stable  Complications: No apparent anesthesia complications

## 2013-07-14 NOTE — Anesthesia Postprocedure Evaluation (Signed)
  Anesthesia Post-op Note  Patient: [name]  Procedure(s) Performed: Procedure(s) (LRB): ESOPHAGOGASTRODUODENOSCOPY (EGD) (N/A) SAVORY DILATION (N/A)  Patient Location: PACU  Anesthesia Type: MAC  Level of Consciousness: awake and alert   Airway and Oxygen Therapy: Patient Spontanous Breathing  Post-op Pain: mild  Post-op Assessment: Post-op Vital signs reviewed, Patient's Cardiovascular Status Stable, Respiratory Function Stable, Patent Airway and No signs of Nausea or vomiting  Last Vitals:  Filed Vitals:   07/14/13 0729  BP: 123/79  Temp: 36.6 C  Resp: 20    Post-op Vital Signs: stable   Complications: No apparent anesthesia complications

## 2013-07-15 ENCOUNTER — Ambulatory Visit
Admission: RE | Admit: 2013-07-15 | Discharge: 2013-07-15 | Disposition: A | Discharge status: Home / Self Care | Payer: BC Managed Care – PPO | Source: Ambulatory Visit | Attending: Radiation Oncology | Admitting: Radiation Oncology

## 2013-07-15 ENCOUNTER — Encounter: Payer: Self-pay | Admitting: Gastroenterology

## 2013-07-15 ENCOUNTER — Telehealth: Payer: Self-pay

## 2013-07-15 VITALS — BP 101/66 | HR 105 | Temp 97.6°F | Ht 70.0 in | Wt 198.2 lb

## 2013-07-15 DIAGNOSIS — Z9221 Personal history of antineoplastic chemotherapy: Secondary | ICD-10-CM | POA: Insufficient documentation

## 2013-07-15 DIAGNOSIS — Z923 Personal history of irradiation: Secondary | ICD-10-CM | POA: Insufficient documentation

## 2013-07-15 DIAGNOSIS — C3490 Malignant neoplasm of unspecified part of unspecified bronchus or lung: Secondary | ICD-10-CM

## 2013-07-15 DIAGNOSIS — C3432 Malignant neoplasm of lower lobe, left bronchus or lung: Secondary | ICD-10-CM

## 2013-07-15 DIAGNOSIS — Z79899 Other long term (current) drug therapy: Secondary | ICD-10-CM | POA: Insufficient documentation

## 2013-07-15 DIAGNOSIS — K222 Esophageal obstruction: Secondary | ICD-10-CM | POA: Insufficient documentation

## 2013-07-15 HISTORY — DX: Reserved for inherently not codable concepts without codable children: IMO0001

## 2013-07-15 HISTORY — DX: Reserved for concepts with insufficient information to code with codable children: IMO0002

## 2013-07-15 MED ORDER — HYDROCODONE-ACETAMINOPHEN 7.5-325 MG/15ML PO SOLN: 10.0000 mL | Freq: Four times a day (QID) | ORAL | Status: DC | PRN

## 2013-07-15 MED ORDER — OXYCODONE HCL 5 MG PO TABS: 5.0000 mg | ORAL_TABLET | ORAL | Status: DC | PRN

## 2013-07-15 MED ORDER — MAGIC MOUTHWASH W/LIDOCAINE: 5.0000 mL | Freq: Four times a day (QID) | ORAL | Status: DC | PRN

## 2013-07-15 MED ORDER — MORPHINE SULFATE 4 MG/ML IJ SOLN
4.0000 mg | Freq: Once | INTRAMUSCULAR | Status: AC
  Administered 2013-07-15: 4 mg via INTRAMUSCULAR
  Filled 2013-07-15: qty 1

## 2013-07-15 NOTE — Telephone Encounter (Signed)
Called patient to see how he is doing post second dilatation of esophagus and to offer re-scheduling of today's follow up appointment.He is tolerating full liquid diet only but is still having difficulty swallowing.Will keep appointment today as already has someone to bring him and he will need refill on hycet.States he is scheduled for dilatation 2 more times.

## 2013-07-15 NOTE — Progress Notes (Signed)
Patient for follow up post barium swallow and EGD.Patient has high grade stricture of esophagus.Dilatation performed on 07/06/13 and 07/14/13.Patient will require dilatation 2 more times as it was difficult for Dr.Kaplan to get scope past stricture.Patient will need refill on hycet or get something stronger for pain.He has had a 3 lb increase since previous appointment.

## 2013-07-18 ENCOUNTER — Telehealth: Payer: Self-pay

## 2013-07-18 ENCOUNTER — Other Ambulatory Visit: Payer: Self-pay

## 2013-07-18 ENCOUNTER — Encounter (HOSPITAL_COMMUNITY): Payer: Self-pay | Admitting: Gastroenterology

## 2013-07-18 DIAGNOSIS — K222 Esophageal obstruction: Secondary | ICD-10-CM

## 2013-07-18 NOTE — Progress Notes (Signed)
Department of Radiation Oncology  Phone:  [phone] Fax:        [phone]   Name: [name] MRN: [id number]  DOB: 28-Dec-1955  Date: 07/15/2013  Follow Up Visit Note  Diagnosis: Limited stage small cell lung cancer  Summary and Interval since last radiation: 60 Gy to the left lung completed 04/28/13 (2 months from treatment)  Interval History: [name] presents today for routine followup.  [name] had a barium swallow which showed an esophageal stricture. [name] underwent dilation yesterday and is in excruciating pain in the office today.  [name] is accompanied by his sister.  [name] feels it opened his esophagus up some in that [name] is now able to swallow liquids but [name] is in extremem pain. Magic mouthwash does the best in terms of alleviating his symptoms.  [name] would like something stronger than hycet to have in case [name] needs it. [name] has a CT scheduled 3/25 and follow up with Dr. Julien Nordmann.  [name] is not in a place to discuss PCI today but asked that I explain it to his sister and [name] would listne "as much as I can with all this pain". [name] is basically "living on boost"  Allergies:  Allergies  Allergen Reactions  . Erythromycin Other (See Comments)    Reaction while in hospital - pt doesn't know reaction  . Fentanyl Other (See Comments)    delirium  . Penicillins Other (See Comments)    Childhood reaction - unknown    Medications:  Current Outpatient Prescriptions  Medication Sig Dispense Refill  . albuterol (PROAIR HFA) 108 (90 BASE) MCG/ACT inhaler Inhale 2 puffs into the lungs every 6 (six) hours as needed for wheezing or shortness of breath.      Marland Kitchen albuterol (PROAIR HFA) 108 (90 BASE) MCG/ACT inhaler INHALE 2 PUFFS INTO THE LUNGS EVERY 6 (SIX) HOURS AS NEEDED FORWHEEZING.  18 g  10  . albuterol (PROVENTIL) (2.5 MG/3ML) 0.083% nebulizer solution Take 3 mLs (2.5 mg total) by nebulization 5 (five) times daily as needed for wheezing or shortness of breath.  75 mL  12  . Alum & Mag Hydroxide-Simeth  (MAGIC MOUTHWASH W/LIDOCAINE) SOLN Take 5 mLs by mouth 4 (four) times daily as needed for mouth pain.  500 mL  2  . atorvastatin (LIPITOR) 40 MG tablet TAKE  ONE TABLET BY MOUTH NIGHTLY AT BEDTIME  30 tablet  0  . diltiazem (CARDIZEM CD) 240 MG 24 hr capsule Take 1 capsule (240 mg total) by mouth daily.  30 capsule  2  . fluconazole (DIFLUCAN) 10 MG/ML suspension Take 10 mLs (100 mg total) by mouth daily.  70 mL  0  . Fluticasone Furoate-Vilanterol (BREO ELLIPTA) 100-25 MCG/INH AEPB Inhale 1 puff into the lungs 2 (two) times daily.  28 each  1  . glipiZIDE (GLUCOTROL) 10 MG tablet Take 10 mg by mouth daily.      Marland Kitchen HYDROcodone-acetaminophen (HYCET) 7.5-325 mg/15 ml solution Take 10 mLs by mouth 4 (four) times daily as needed for moderate pain.  473 mL  0  . isosorbide mononitrate (IMDUR) 60 MG 24 hr tablet Take 1 tablet (60 mg total) by mouth daily.  30 tablet  8  . metoprolol tartrate (LOPRESSOR) 25 MG tablet Take 1 tablet (25 mg total) by mouth 2 (two) times daily.  60 tablet  2  . CARAFATE 1 GM/10ML suspension TAKE TWO TEASPOONFULS BY MOUTH 4 TIMES DAILY WITH MEALS AND ATBEDTIME.  420 mL  2  . lidocaine (XYLOCAINE)  2 % solution Use as directed 20 mLs in the mouth or throat as needed for mouth pain.  100 mL  2  . nitroGLYCERIN (NITROSTAT) 0.4 MG SL tablet Place 0.4 mg under the tongue every 5 (five) minutes as needed for chest pain. May repeat up to 3 doses      . ondansetron (ZOFRAN-ODT) 4 MG disintegrating tablet DISSOLVE ONE TABLET IN MOUTH EVERY EIGHT HOURS AS NEEDED NAUSEA ANDVOMITING  20 tablet  0  . oxyCODONE (OXY IR/ROXICODONE) 5 MG immediate release tablet Take 1 tablet (5 mg total) by mouth every 4 (four) hours as needed for severe pain.  60 tablet  0  . pantoprazole (PROTONIX) 40 MG tablet Take 1 tablet (40 mg total) by mouth 2 (two) times daily.  60 tablet  2  . risperiDONE (RISPERDAL) 1 MG tablet Take 1 tablet (1 mg total) by mouth 2 (two) times daily.  60 tablet  2   No current  facility-administered medications for this encounter.    Physical Exam:  Filed Vitals:   07/15/13 1618  BP: 101/66  Pulse: 105  Temp: 97.6 F (36.4 C)  Height: 5\' 10"  (1.778 m)  Weight: 198 lb 3.2 oz (89.903 kg)  SpO2: 98%   In notable distress in a wheelchair. Sweaty. No skin changes.   IMPRESSION: [name] is a 58 y.o. male s/p chemoRT with complications of esophageal stricture immediately following chemoRT  PLAN:  I gave him IM morphine in the office as well as refills of his MMW and a new prescription for oxycodone tablets in case [name] needs them.  We discussed PCI and the high rates of brain failures in small cell patients. We discussed 12 treatments as an outpatient to 24 GY to the whole brain. At this point, [name] is still declining from the stricture so I will plan to see him back after the scan. At that point, i hope [name] is improving and will be better able to tolerate PCI.   Thea Silversmith, MD

## 2013-07-18 NOTE — Telephone Encounter (Signed)
I thought you had said that you're scheduling him for 12 or 12:30 on Thursday.

## 2013-07-18 NOTE — Telephone Encounter (Signed)
I tried to but the pt does not drive and has to rely on his siblings for transportation. He needs morning appts on Tues, thurs, or Fridays.

## 2013-07-18 NOTE — Telephone Encounter (Signed)
Pt scheduled for EGD with savary dil/fluro and propofol at [facility] 3/17/15at 7:30am. Pt to arrive at the hospital at 6:30am. Pt to be NPO after midnight. Pt had to have early am appts due to transportation.

## 2013-07-19 ENCOUNTER — Encounter (HOSPITAL_COMMUNITY): Payer: Self-pay | Admitting: *Deleted

## 2013-07-20 ENCOUNTER — Encounter (HOSPITAL_COMMUNITY): Payer: Self-pay | Admitting: Pharmacy Technician

## 2013-07-26 ENCOUNTER — Ambulatory Visit (HOSPITAL_COMMUNITY): Payer: BC Managed Care – PPO

## 2013-07-26 ENCOUNTER — Ambulatory Visit: Payer: BC Managed Care – PPO | Admitting: Nutrition

## 2013-07-26 ENCOUNTER — Encounter (HOSPITAL_COMMUNITY): Payer: BC Managed Care – PPO | Admitting: Registered Nurse

## 2013-07-26 ENCOUNTER — Encounter (HOSPITAL_COMMUNITY): Payer: Self-pay | Admitting: *Deleted

## 2013-07-26 ENCOUNTER — Ambulatory Visit (HOSPITAL_COMMUNITY)
Admission: RE | Admit: 2013-07-26 | Discharge: 2013-07-26 | Disposition: A | Discharge status: Home / Self Care | Payer: BC Managed Care – PPO | Source: Ambulatory Visit | Attending: Gastroenterology | Admitting: Gastroenterology

## 2013-07-26 ENCOUNTER — Ambulatory Visit (HOSPITAL_COMMUNITY): Payer: BC Managed Care – PPO | Admitting: Registered Nurse

## 2013-07-26 ENCOUNTER — Other Ambulatory Visit: Payer: Self-pay | Admitting: Radiation Oncology

## 2013-07-26 ENCOUNTER — Encounter (HOSPITAL_COMMUNITY)
Admission: RE | Disposition: A | Discharge status: Home / Self Care | Payer: Self-pay | Source: Ambulatory Visit | Attending: Gastroenterology

## 2013-07-26 DIAGNOSIS — Z8673 Personal history of transient ischemic attack (TIA), and cerebral infarction without residual deficits: Secondary | ICD-10-CM | POA: Insufficient documentation

## 2013-07-26 DIAGNOSIS — K222 Esophageal obstruction: Secondary | ICD-10-CM | POA: Insufficient documentation

## 2013-07-26 DIAGNOSIS — R1310 Dysphagia, unspecified: Secondary | ICD-10-CM | POA: Insufficient documentation

## 2013-07-26 DIAGNOSIS — E785 Hyperlipidemia, unspecified: Secondary | ICD-10-CM | POA: Insufficient documentation

## 2013-07-26 DIAGNOSIS — E119 Type 2 diabetes mellitus without complications: Secondary | ICD-10-CM | POA: Insufficient documentation

## 2013-07-26 DIAGNOSIS — I129 Hypertensive chronic kidney disease with stage 1 through stage 4 chronic kidney disease, or unspecified chronic kidney disease: Secondary | ICD-10-CM | POA: Insufficient documentation

## 2013-07-26 DIAGNOSIS — N189 Chronic kidney disease, unspecified: Secondary | ICD-10-CM | POA: Insufficient documentation

## 2013-07-26 DIAGNOSIS — E669 Obesity, unspecified: Secondary | ICD-10-CM | POA: Insufficient documentation

## 2013-07-26 DIAGNOSIS — J449 Chronic obstructive pulmonary disease, unspecified: Secondary | ICD-10-CM | POA: Insufficient documentation

## 2013-07-26 DIAGNOSIS — Z87891 Personal history of nicotine dependence: Secondary | ICD-10-CM | POA: Insufficient documentation

## 2013-07-26 DIAGNOSIS — J4489 Other specified chronic obstructive pulmonary disease: Secondary | ICD-10-CM | POA: Insufficient documentation

## 2013-07-26 DIAGNOSIS — I2510 Atherosclerotic heart disease of native coronary artery without angina pectoris: Secondary | ICD-10-CM | POA: Insufficient documentation

## 2013-07-26 DIAGNOSIS — C3490 Malignant neoplasm of unspecified part of unspecified bronchus or lung: Secondary | ICD-10-CM | POA: Insufficient documentation

## 2013-07-26 DIAGNOSIS — Y842 Radiological procedure and radiotherapy as the cause of abnormal reaction of the patient, or of later complication, without mention of misadventure at the time of the procedure: Secondary | ICD-10-CM | POA: Insufficient documentation

## 2013-07-26 DIAGNOSIS — I252 Old myocardial infarction: Secondary | ICD-10-CM | POA: Insufficient documentation

## 2013-07-26 HISTORY — PX: SAVORY DILATION: SHX5439

## 2013-07-26 HISTORY — PX: ESOPHAGOGASTRODUODENOSCOPY: SHX5428

## 2013-07-26 HISTORY — DX: Personal history of antineoplastic chemotherapy: Z92.21

## 2013-07-26 LAB — GLUCOSE, CAPILLARY: GLUCOSE-CAPILLARY: 120 mg/dL — AB (ref 70–99)

## 2013-07-26 SURGERY — EGD (ESOPHAGOGASTRODUODENOSCOPY)
Anesthesia: Monitor Anesthesia Care

## 2013-07-26 MED ORDER — PROPOFOL INFUSION 10 MG/ML OPTIME
INTRAVENOUS | Status: DC | PRN
  Administered 2013-07-26: 200 ug/kg/min via INTRAVENOUS

## 2013-07-26 MED ORDER — MIDAZOLAM HCL 2 MG/2ML IJ SOLN
INTRAMUSCULAR | Status: AC
  Filled 2013-07-26: qty 2

## 2013-07-26 MED ORDER — BUTAMBEN-TETRACAINE-BENZOCAINE 2-2-14 % EX AERO
INHALATION_SPRAY | CUTANEOUS | Status: DC | PRN
  Administered 2013-07-26: 2 via TOPICAL

## 2013-07-26 MED ORDER — SODIUM CHLORIDE 0.9 % IV SOLN: INTRAVENOUS | Status: DC

## 2013-07-26 MED ORDER — MAGIC MOUTHWASH
5.0000 mL | Freq: Once | ORAL | Status: AC
  Administered 2013-07-26: 5 mL via ORAL
  Filled 2013-07-26: qty 5

## 2013-07-26 MED ORDER — HYDROCODONE-ACETAMINOPHEN 7.5-325 MG/15ML PO SOLN: 10.0000 mL | Freq: Four times a day (QID) | ORAL | Status: DC | PRN

## 2013-07-26 MED ORDER — LABETALOL HCL 5 MG/ML IV SOLN
INTRAVENOUS | Status: DC | PRN
  Administered 2013-07-26: 7.5 mg via INTRAVENOUS

## 2013-07-26 MED ORDER — LIDOCAINE HCL (CARDIAC) 20 MG/ML IV SOLN
INTRAVENOUS | Status: AC
  Filled 2013-07-26: qty 5

## 2013-07-26 MED ORDER — FENTANYL CITRATE 0.05 MG/ML IJ SOLN
INTRAMUSCULAR | Status: AC
  Filled 2013-07-26: qty 2

## 2013-07-26 MED ORDER — MIDAZOLAM HCL 5 MG/5ML IJ SOLN
INTRAMUSCULAR | Status: DC | PRN
  Administered 2013-07-26: 1 mg via INTRAVENOUS

## 2013-07-26 MED ORDER — LACTATED RINGERS IV SOLN
INTRAVENOUS | Status: DC | PRN
  Administered 2013-07-26: 07:00:00 via INTRAVENOUS

## 2013-07-26 MED ORDER — PROPOFOL 10 MG/ML IV BOLUS
INTRAVENOUS | Status: DC | PRN
  Administered 2013-07-26: 50 mg via INTRAVENOUS

## 2013-07-26 MED ORDER — PROPOFOL 10 MG/ML IV BOLUS
INTRAVENOUS | Status: AC
  Filled 2013-07-26: qty 20

## 2013-07-26 MED ORDER — GLYCOPYRROLATE 0.2 MG/ML IJ SOLN
INTRAMUSCULAR | Status: AC
  Filled 2013-07-26: qty 1

## 2013-07-26 MED ORDER — LIDOCAINE HCL (CARDIAC) 20 MG/ML IV SOLN
INTRAVENOUS | Status: DC | PRN
Start: 2013-07-26 — End: 2013-07-26
  Administered 2013-07-26: 50 mg via INTRAVENOUS

## 2013-07-26 MED ORDER — GLYCOPYRROLATE 0.2 MG/ML IJ SOLN
INTRAMUSCULAR | Status: DC | PRN
  Administered 2013-07-26: 0.2 mg via INTRAVENOUS

## 2013-07-26 NOTE — Discharge Instructions (Signed)
My office will contact you to set up a repeat dilatation in 7-10 daysGastrointestinal Endoscopy Care After Refer to this sheet in the next few weeks. These instructions provide you with information on caring for yourself after your procedure. Your caregiver may also give you more specific instructions. Your treatment has been planned according to current medical practices, but problems sometimes occur. Call your caregiver if you have any problems or questions after your procedure. HOME CARE INSTRUCTIONS  If you were given medicine to help you relax (sedative), do not drive, operate machinery, or sign important documents for 24 hours.  Avoid alcohol and hot or warm beverages for the first 24 hours after the procedure.  Only take over-the-counter or prescription medicines for pain, discomfort, or fever as directed by your caregiver. You may resume taking your normal medicines unless your caregiver tells you otherwise. Ask your caregiver when you may resume taking medicines that may cause bleeding, such as aspirin, clopidogrel, or warfarin.  You may return to your normal diet and activities on the day after your procedure, or as directed by your caregiver. Walking may help to reduce any bloated feeling in your abdomen.  Drink enough fluids to keep your urine clear or pale yellow.  You may gargle with salt water if you have a sore throat. SEEK IMMEDIATE MEDICAL CARE IF:  You have severe nausea or vomiting.  You have severe abdominal pain, abdominal cramps that last longer than 6 hours, or abdominal swelling (distention).  You have severe shoulder or back pain.  You have trouble swallowing.  You have shortness of breath, your breathing is shallow, or you are breathing faster than normal.  You have a fever or a rapid heartbeat.  You vomit blood or material that looks like coffee grounds.  You have bloody, black, or tarry stools. MAKE SURE YOU:  Understand these instructions.  Will  watch your condition.  Will get help right away if you are not doing well or get worse. Document Released: 12/11/2003 Document Revised: 10/28/2011 Document Reviewed: 07/29/2011 [facility] Patient Information 2014 [place], [place].

## 2013-07-26 NOTE — Anesthesia Preprocedure Evaluation (Addendum)
Anesthesia Evaluation  Patient identified by MRN, date of birth, ID band Patient awake    Reviewed: Allergy & Precautions, H&P , NPO status , Patient's Chart, lab work & pertinent test results, reviewed documented beta blocker date and time   Airway Mallampati: II TM Distance: >3 FB Neck ROM: full    Dental no notable dental hx. (+) Teeth Intact, Dental Advisory Given   Pulmonary shortness of breath and with exertion, asthma , COPD oxygen dependent, former smoker,  breath sounds clear to auscultation  Pulmonary exam normal       Cardiovascular hypertension, Pt. on medications and Pt. on home beta blockers + CAD, + Past MI, + Cardiac Stents and +CHF Rhythm:regular Rate:Normal  NSTEMI 6/10. No CP   Neuro/Psych CVA, Residual Symptoms negative neurological ROS  negative psych ROS   GI/Hepatic negative GI ROS, Neg liver ROS,   Endo/Other  diabetes, Well Controlled, Type 2, Oral Hypoglycemic Agents  Renal/GU Renal diseaseChronic kidney disease  negative genitourinary   Musculoskeletal   Abdominal   Peds  Hematology negative hematology ROS (+)   Anesthesia Other Findings   Reproductive/Obstetrics negative OB ROS                          Anesthesia Physical Anesthesia Plan  ASA: IV  Anesthesia Plan: MAC   Post-op Pain Management:    Induction:   Airway Management Planned: Nasal Cannula  Additional Equipment:   Intra-op Plan:   Post-operative Plan:   Informed Consent: I have reviewed the patients History and Physical, chart, labs and discussed the procedure including the risks, benefits and alternatives for the proposed anesthesia with the patient or authorized representative who has indicated his/her understanding and acceptance.   Dental Advisory Given  Plan Discussed with: CRNA and Surgeon  Anesthesia Plan Comments:        Anesthesia Quick Evaluation

## 2013-07-26 NOTE — Transfer of Care (Signed)
Immediate Anesthesia Transfer of Care Note  Patient: [name]  Procedure(s) Performed: Procedure(s): ESOPHAGOGASTRODUODENOSCOPY (EGD) (N/A) SAVORY DILATION (N/A)  Patient Location: PACU  Anesthesia Type:MAC  Level of Consciousness: awake, oriented and patient cooperative  Airway & Oxygen Therapy: Patient Spontanous Breathing and Patient connected to nasal cannula oxygen  Post-op Assessment: Report given to PACU RN, Post -op Vital signs reviewed and stable and Patient moving all extremities X 4  Post vital signs: stable  Complications: No apparent anesthesia complications

## 2013-07-26 NOTE — H&P (View-Only) (Signed)
_                                                                                                                History of Present Illness: 58yo WM here for esophageal dilitation.  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    Past Medical History  Diagnosis Date  . CAD (coronary artery disease)     a. PCI 3/08 to OM2 and mid CFX; b.  NSTEMI 6/10:  Prior stents patent, 90% dCFX,  mRCA occl (old) with collats; EF 55% on LV-gram => Xience DES 2.5 x 23 mm to distal CFX;  c. abnl MV 12/13 => LHC 04/23/12:  pLAD 50%, oOM1 80%, long stented segment of the CFX extending into the PLOM with the PLOM totally occluded ostially, CFX 70% ISR, mCFX 80% ISR, pRCA chronically occluded. Med Rx planned  . Diabetes mellitus type II   . Hyperlipidemia   . COPD (chronic obstructive pulmonary disease)     Quit tobacco 09/2009; Gold Stage 2 with asthma component - FEV1 1.53 L/54%, 14% fev1 BD response, DLCO 54% - July 2011; MM genotype 01/07/10; unable to afford spiriva and unwilling to try ics due to prior renal failure: state to Dr. [name], Aug 2011; started on atrovent HFA fall 2011. no desturation on walk test Dec 2011  . CKD (chronic kidney disease)   . Obesity   . History of CVA (cerebrovascular accident) 2007    Without residual defecits  . 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 no further rx  . CHF (congestive heart failure)   . Myocardial infarction     x3  . Hypertension   . Asthma     as child  . Shortness of breath     constant/wears 2 liters  . Pneumonia     02/14/2013 in hospital  . Stroke     7 years- no problems     . Cancer     lung cancer-02/14/2013  . Blood transfusion without reported diagnosis    Past Surgical History  Procedure Laterality Date  . Wedge resection  03/28/2010  . Coronary stent placement  2009  . Cardiac catheterization  2009  . Endobronchial ultrasound Bilateral 02/28/2013    Procedure: ENDOBRONCHIAL ULTRASOUND;  Surgeon: Brand Males, MD;  Location: [facility] END[facility]COPY;  Service: Cardiopulmonary;  Laterality: Bilateral;  . Esophagogastroduodenoscopy N/A 05/04/2013    Procedure: ESOPHAGOGASTRODUODENOSCOPY (EGD);  Surgeon: Inda Castle, MD;  Location: [facility];  Service: Endoscopy;  Laterality: N/A;  . Esophagogastroduodenoscopy N/A 07/06/2013    Procedure: ESOPHAGOGASTRODUODENOSCOPY (EGD);  Surgeon: Inda Castle, MD;  Location: [facility];  Service: Endoscopy;  Laterality: N/A;  . Savory dilation N/A 07/06/2013    Procedure: SAVORY DILATION;  Surgeon: Inda Castle, MD;  Location: [facility];  Service: Endoscopy;  Laterality: N/A;   family history includes Cancer in his maternal grandfather and maternal uncle; Diabetes in his mother; Stroke in his other. No current facility-administered medications for this encounter.   Facility-Administered Medications Ordered in Other Encounters  Medication Dose Route Frequency Provider Last Rate Last Dose  . 0.9 %  sodium chloride infusion    Continuous PRN Garrel Ridgel, CRNA       Allergies as of 07/06/2013 - Review Complete 07/06/2013  Allergen Reaction Noted  . Erythromycin Other (See Comments) 04/14/2012  . Fentanyl Other (See Comments) 05/10/2013  . Penicillins Other (See Comments)     reports that he quit smoking about 3 years ago. His smoking use included Cigarettes. He has a 40 pack-year smoking history. He does not have any smokeless tobacco history on file. He reports that he does not drink alcohol or use illicit drugs.     Review of Systems: Pertinent positive and negative review of systems were noted  in the above HPI section. All other review of systems were otherwise negative.  Vital signs were reviewed in today's medical record Physical Exam: General: Well developed , well nourished, no acute distress Skin: anicteric Head: Normocephalic and atraumatic Eyes:  sclerae anicteric, EOMI Ears: Normal auditory acuity Mouth: No deformity or lesions Neck: Supple, no masses or thyromegaly Lungs: Clear throughout to auscultation Heart: Regular rate and rhythm; no murmurs, rubs or bruits Abdomen: Soft, non tender and non distended. No masses, hepatosplenomegaly or hernias noted. Normal Bowel sounds Rectal:deferred Musculoskeletal: Symmetrical with no gross deformities  Skin: No lesions on visible extremities Pulses:  Normal pulses noted Extremities: No clubbing, cyanosis, edema or deformities noted Neurological: Alert oriented x 4, grossly nonfocal Cervical Nodes:  No significant cervical adenopathy Inguinal Nodes: No significant inguinal adenopathy Psychological:  Alert and cooperative. Normal mood and affect  See Assessment and Plan under Problem List  Impression-radiation induced proximal septal stricture  Plan upper endoscopy with Savary dilatation  Risks, alternatives, and complications of the procedure, including bleeding, perforation, and possible need for surgery, were explained to the patient.  Patient's questions were answered.

## 2013-07-26 NOTE — Anesthesia Postprocedure Evaluation (Signed)
  Anesthesia Post-op Note  Patient: [name]  Procedure(s) Performed: Procedure(s) (LRB): ESOPHAGOGASTRODUODENOSCOPY (EGD) (N/A) SAVORY DILATION (N/A)  Patient Location: PACU  Anesthesia Type: MAC  Level of Consciousness: awake and alert   Airway and Oxygen Therapy: Patient Spontanous Breathing  Post-op Pain: mild  Post-op Assessment: Post-op Vital signs reviewed, Patient's Cardiovascular Status Stable, Respiratory Function Stable, Patent Airway and No signs of Nausea or vomiting  Last Vitals:  Filed Vitals:   07/26/13 0812  BP:   Pulse:   Temp: 37 C  Resp:     Post-op Vital Signs: stable   Complications: No apparent anesthesia complications

## 2013-07-26 NOTE — Addendum Note (Signed)
Addendum created 07/26/13 1231 by Lissa Morales, CRNA   Modules edited: Anesthesia Medication Administration

## 2013-07-26 NOTE — Interval H&P Note (Signed)
History and Physical Interval Note:  07/26/2013 7:37 AM  [name]  has presented today for surgery, with the diagnosis of 530.3  The various methods of treatment have been discussed with the patient and family. After consideration of risks, benefits and other options for treatment, the patient has consented to  Procedure(s): ESOPHAGOGASTRODUODENOSCOPY (EGD) (N/A) SAVORY DILATION (N/A) as a surgical intervention .  The patient's history has been reviewed, patient examined, no change in status, stable for surgery.  I have reviewed the patient's chart and labs.  Questions were answered to the patient's satisfaction.     The recent H&P (dated *07/14/13**) was reviewed, the patient was examined and there is no change in the patients condition since that H&P was completed.   Erskine Emery  07/26/2013, 7:37 AM   Erskine Emery

## 2013-07-26 NOTE — Op Note (Signed)
[facility] [place], [address]   ENDOSCOPY PROCEDURE REPORT  PATIENT: [name], [name]  MR#: [id number] BIRTHDATE: [birth year] , 57  yrs. old GENDER: Male ENDOSCOPIST: Inda Castle, MD ASSISTANT:   Mahala Menghini, technician Alcide Clever, RN, CGRN REFERRED DJ:[id number] Julien Nordmann, M.D. PROCEDURE DATE:  07/26/2013 PROCEDURE:   EGD with dilatation over guidewire ASA CLASS:   Class III INDICATIONS:dysphagia. patient has a radiation-induced stricture in the proximal esophagus MEDICATIONS: MAC sedation, administered by CRNA TOPICAL ANESTHETIC:  DESCRIPTION OF PROCEDURE:   After the risks benefits and alternatives of the procedure were thoroughly explained, informed consent was obtained.  The     endoscope was introduced through the mouth  and advanced to the second portion of the duodenum , The instrument was slowly withdrawn as the mucosa was carefully examined.    A moderately severe stricture beginning at 22-23 cm from the incisors was identified as previously described.  There was a non-obstructing distal esophageal stricture at the GE junction. The 9.2 mm scope traverse the proximal stricture with mild resistance.   The remainder of the upper endoscopy exam was otherwise normal.     Dilation was then performed at the proximal esophagus  Dilator:Savary over guidewire Size:12-12.8-14-19mm Reststance:moderate Heme:yes  There was only mild resistance and minimal heme to the 14 or 15 mm dilators.  COMPLICATIONS: There were no complications. ENDOSCOPIC IMPRESSION: 1.   radiation-induced stricture of the proximal esophagus-status post Savary dilation to 15 mm 2.  nonobstructing distal esophageal stricture 3.   The remainder of the upper endoscopy exam was otherwise normal.   RECOMMENDATIONS: repeat dilation with MAC in 7-10 days  eSigned:  Inda Castle, MD 07/26/2013 8:07 AM  CC:

## 2013-07-26 NOTE — Progress Notes (Signed)
Patient presents to dietitian's office.  He was confused about when his appointment was scheduled.  I went ahead and saw him today.  Noted patient's weight documented as 198 pounds March 17, down from 202.7 pounds January 27.  Patient reports difficulty swallowing.  He is having his esophagus stretched so that he can swallow foods and liquids easier.  Throat continues to be quite sore.  He is tolerating yogurt, milk, ensure and other liquid foods.  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  Intervention: Patient educated to continue strategies for increasing oral intake utilizing blended foods or oral nutrition supplements to meet nutrition needs.  Provided patient with one complimentary case of Ensure Plus.  Encouraged patient to increase intake of Ensure Plus to a minimum of 4 daily.  Monitoring, evaluation, goals: Patient will tolerate liquids and soft, moist foods to minimize weight loss.  Next visit: Thursday, March 26.

## 2013-07-27 ENCOUNTER — Telehealth: Payer: Self-pay

## 2013-07-27 ENCOUNTER — Encounter (HOSPITAL_COMMUNITY): Payer: Self-pay | Admitting: Gastroenterology

## 2013-07-27 ENCOUNTER — Other Ambulatory Visit: Payer: Self-pay

## 2013-07-27 DIAGNOSIS — K222 Esophageal obstruction: Secondary | ICD-10-CM

## 2013-07-27 NOTE — Telephone Encounter (Signed)
Reminder sent to Dr. Deatra Ina.

## 2013-07-27 NOTE — Telephone Encounter (Signed)
Pt scheduled for EGD with savary dil/fluro with propofol at [facility] 08/30/13@7 :30am. Pt to arrive at [facility] at 6:30am. Pt to be NPO after midnight. Pt aware of appt date and time.

## 2013-07-27 NOTE — Telephone Encounter (Signed)
Message copied by Algernon Huxley on Wed Jul 27, 2013 10:58 AM ------      Message from: Erskine Emery D      Created: Tue Jul 26, 2013  4:47 PM      Regarding: RE: EGD with dil       Ok.  Please remind me the day before.      ----- Message -----         From: Maury Dus, RN         Sent: 07/26/2013   4:01 PM           To: Inda Castle, MD      Subject: EGD with dil                                             Dr. Deatra Ina,            Are you ok with me scheduling this pt for an egd with dil/propofol at [facility] 08/30/13@7 :30am? Pt has transportation problems and needs early am appts.            Thanks,      [name]       ------

## 2013-08-03 ENCOUNTER — Ambulatory Visit (HOSPITAL_COMMUNITY)
Admission: RE | Admit: 2013-08-03 | Discharge: 2013-08-03 | Disposition: A | Discharge status: Home / Self Care | Payer: BC Managed Care – PPO | Source: Ambulatory Visit | Attending: Internal Medicine | Admitting: Internal Medicine

## 2013-08-03 ENCOUNTER — Other Ambulatory Visit: Payer: Self-pay | Admitting: Internal Medicine

## 2013-08-03 ENCOUNTER — Inpatient Hospital Stay (HOSPITAL_COMMUNITY)
Admission: EM | Admit: 2013-08-03 | Discharge: 2013-08-04 | DRG: 175 | Disposition: A | Discharge status: Home / Self Care | Payer: BC Managed Care – PPO | Attending: Internal Medicine | Admitting: Internal Medicine

## 2013-08-03 ENCOUNTER — Encounter (HOSPITAL_COMMUNITY): Payer: Self-pay | Admitting: Emergency Medicine

## 2013-08-03 DIAGNOSIS — R1310 Dysphagia, unspecified: Secondary | ICD-10-CM

## 2013-08-03 DIAGNOSIS — C3432 Malignant neoplasm of lower lobe, left bronchus or lung: Secondary | ICD-10-CM

## 2013-08-03 DIAGNOSIS — K222 Esophageal obstruction: Secondary | ICD-10-CM

## 2013-08-03 DIAGNOSIS — T66XXXA Radiation sickness, unspecified, initial encounter: Secondary | ICD-10-CM

## 2013-08-03 DIAGNOSIS — C801 Malignant (primary) neoplasm, unspecified: Secondary | ICD-10-CM

## 2013-08-03 DIAGNOSIS — M792 Neuralgia and neuritis, unspecified: Secondary | ICD-10-CM

## 2013-08-03 DIAGNOSIS — E669 Obesity, unspecified: Secondary | ICD-10-CM | POA: Diagnosis present

## 2013-08-03 DIAGNOSIS — Z880 Allergy status to penicillin: Secondary | ICD-10-CM

## 2013-08-03 DIAGNOSIS — R5081 Fever presenting with conditions classified elsewhere: Secondary | ICD-10-CM

## 2013-08-03 DIAGNOSIS — C3490 Malignant neoplasm of unspecified part of unspecified bronchus or lung: Secondary | ICD-10-CM

## 2013-08-03 DIAGNOSIS — I10 Essential (primary) hypertension: Secondary | ICD-10-CM

## 2013-08-03 DIAGNOSIS — J4489 Other specified chronic obstructive pulmonary disease: Secondary | ICD-10-CM | POA: Diagnosis present

## 2013-08-03 DIAGNOSIS — N183 Chronic kidney disease, stage 3 unspecified: Secondary | ICD-10-CM | POA: Diagnosis present

## 2013-08-03 DIAGNOSIS — Y842 Radiological procedure and radiotherapy as the cause of abnormal reaction of the patient, or of later complication, without mention of misadventure at the time of the procedure: Secondary | ICD-10-CM | POA: Diagnosis present

## 2013-08-03 DIAGNOSIS — I2699 Other pulmonary embolism without acute cor pulmonale: Secondary | ICD-10-CM

## 2013-08-03 DIAGNOSIS — J449 Chronic obstructive pulmonary disease, unspecified: Secondary | ICD-10-CM | POA: Diagnosis present

## 2013-08-03 DIAGNOSIS — D61810 Antineoplastic chemotherapy induced pancytopenia: Secondary | ICD-10-CM

## 2013-08-03 DIAGNOSIS — D696 Thrombocytopenia, unspecified: Secondary | ICD-10-CM | POA: Diagnosis present

## 2013-08-03 DIAGNOSIS — E43 Unspecified severe protein-calorie malnutrition: Secondary | ICD-10-CM

## 2013-08-03 DIAGNOSIS — Z6828 Body mass index (BMI) 28.0-28.9, adult: Secondary | ICD-10-CM

## 2013-08-03 DIAGNOSIS — Z9861 Coronary angioplasty status: Secondary | ICD-10-CM

## 2013-08-03 DIAGNOSIS — E8881 Metabolic syndrome: Secondary | ICD-10-CM

## 2013-08-03 DIAGNOSIS — E119 Type 2 diabetes mellitus without complications: Secondary | ICD-10-CM

## 2013-08-03 DIAGNOSIS — K208 Other esophagitis without bleeding: Secondary | ICD-10-CM

## 2013-08-03 DIAGNOSIS — F05 Delirium due to known physiological condition: Secondary | ICD-10-CM

## 2013-08-03 DIAGNOSIS — R0602 Shortness of breath: Secondary | ICD-10-CM | POA: Diagnosis present

## 2013-08-03 DIAGNOSIS — D649 Anemia, unspecified: Secondary | ICD-10-CM | POA: Diagnosis present

## 2013-08-03 DIAGNOSIS — I208 Other forms of angina pectoris: Secondary | ICD-10-CM

## 2013-08-03 DIAGNOSIS — I4891 Unspecified atrial fibrillation: Secondary | ICD-10-CM

## 2013-08-03 DIAGNOSIS — I319 Disease of pericardium, unspecified: Secondary | ICD-10-CM | POA: Diagnosis present

## 2013-08-03 DIAGNOSIS — J9621 Acute and chronic respiratory failure with hypoxia: Secondary | ICD-10-CM

## 2013-08-03 DIAGNOSIS — Z791 Long term (current) use of non-steroidal anti-inflammatories (NSAID): Secondary | ICD-10-CM

## 2013-08-03 DIAGNOSIS — J441 Chronic obstructive pulmonary disease with (acute) exacerbation: Secondary | ICD-10-CM

## 2013-08-03 DIAGNOSIS — I129 Hypertensive chronic kidney disease with stage 1 through stage 4 chronic kidney disease, or unspecified chronic kidney disease: Secondary | ICD-10-CM | POA: Diagnosis present

## 2013-08-03 DIAGNOSIS — I2089 Other forms of angina pectoris: Secondary | ICD-10-CM

## 2013-08-03 DIAGNOSIS — Z885 Allergy status to narcotic agent status: Secondary | ICD-10-CM

## 2013-08-03 DIAGNOSIS — Z87891 Personal history of nicotine dependence: Secondary | ICD-10-CM

## 2013-08-03 DIAGNOSIS — I059 Rheumatic mitral valve disease, unspecified: Secondary | ICD-10-CM

## 2013-08-03 DIAGNOSIS — Z79899 Other long term (current) drug therapy: Secondary | ICD-10-CM

## 2013-08-03 DIAGNOSIS — Z9981 Dependence on supplemental oxygen: Secondary | ICD-10-CM

## 2013-08-03 DIAGNOSIS — I509 Heart failure, unspecified: Secondary | ICD-10-CM | POA: Diagnosis present

## 2013-08-03 DIAGNOSIS — J9620 Acute and chronic respiratory failure, unspecified whether with hypoxia or hypercapnia: Secondary | ICD-10-CM

## 2013-08-03 DIAGNOSIS — Z8673 Personal history of transient ischemic attack (TIA), and cerebral infarction without residual deficits: Secondary | ICD-10-CM

## 2013-08-03 DIAGNOSIS — E785 Hyperlipidemia, unspecified: Secondary | ICD-10-CM

## 2013-08-03 DIAGNOSIS — R918 Other nonspecific abnormal finding of lung field: Secondary | ICD-10-CM

## 2013-08-03 DIAGNOSIS — I2510 Atherosclerotic heart disease of native coronary artery without angina pectoris: Secondary | ICD-10-CM

## 2013-08-03 DIAGNOSIS — D709 Neutropenia, unspecified: Secondary | ICD-10-CM

## 2013-08-03 HISTORY — DX: Other pulmonary embolism without acute cor pulmonale: I26.99

## 2013-08-03 LAB — BASIC METABOLIC PANEL
BUN: 12 mg/dL (ref 6–23)
CO2: 25 mEq/L (ref 19–32)
CREATININE: 1.31 mg/dL (ref 0.50–1.35)
Calcium: 9.4 mg/dL (ref 8.4–10.5)
Chloride: 99 mEq/L (ref 96–112)
GFR calc Af Amer: 68 mL/min — ABNORMAL LOW (ref >90)
GFR, EST NON AFRICAN AMERICAN: 59 mL/min — AB (ref >90)
GLUCOSE: 125 mg/dL — AB (ref 70–99)
POTASSIUM: 4.1 meq/L (ref 3.7–5.3)
Sodium: 137 mEq/L (ref 137–147)

## 2013-08-03 LAB — GLUCOSE, CAPILLARY
Glucose-Capillary: 101 mg/dL — ABNORMAL HIGH (ref 70–99)
Glucose-Capillary: 122 mg/dL — ABNORMAL HIGH (ref 70–99)

## 2013-08-03 LAB — CBC
HCT: 35.2 % — ABNORMAL LOW (ref 39.0–52.0)
Hemoglobin: 11.6 g/dL — ABNORMAL LOW (ref 13.0–17.0)
MCH: 31.5 pg (ref 26.0–34.0)
MCHC: 33 g/dL (ref 30.0–36.0)
MCV: 95.7 fL (ref 78.0–100.0)
Platelets: 131 10*3/uL — ABNORMAL LOW (ref 150–400)
RBC: 3.68 MIL/uL — ABNORMAL LOW (ref 4.22–5.81)
RDW: 15.2 % (ref 11.5–15.5)
WBC: 5.8 10*3/uL (ref 4.0–10.5)

## 2013-08-03 LAB — APTT: aPTT: 27 seconds (ref 24–37)

## 2013-08-03 LAB — PROTIME-INR
INR: 1.1 (ref 0.00–1.49)
Prothrombin Time: 14 seconds (ref 11.6–15.2)

## 2013-08-03 LAB — PRO B NATRIURETIC PEPTIDE: PRO B NATRI PEPTIDE: 2537 pg/mL — AB (ref 0–125)

## 2013-08-03 LAB — HEMOGLOBIN A1C
Hgb A1c MFr Bld: 5.3 % (ref <5.7)
Mean Plasma Glucose: 105 mg/dL (ref <117)

## 2013-08-03 MED ORDER — PANTOPRAZOLE SODIUM 40 MG PO TBEC
40.0000 mg | DELAYED_RELEASE_TABLET | Freq: Two times a day (BID) | ORAL | Status: DC
  Administered 2013-08-03 – 2013-08-04 (×2): 40 mg via ORAL
  Filled 2013-08-03 (×3): qty 1

## 2013-08-03 MED ORDER — ISOSORBIDE MONONITRATE ER 60 MG PO TB24
60.0000 mg | ORAL_TABLET | Freq: Every morning | ORAL | Status: DC
  Filled 2013-08-03: qty 1

## 2013-08-03 MED ORDER — METOPROLOL TARTRATE 25 MG PO TABS
25.0000 mg | ORAL_TABLET | Freq: Two times a day (BID) | ORAL | Status: DC
  Administered 2013-08-03 – 2013-08-04 (×2): 25 mg via ORAL
  Filled 2013-08-03 (×3): qty 1

## 2013-08-03 MED ORDER — IOHEXOL 300 MG/ML  SOLN
100.0000 mL | Freq: Once | INTRAMUSCULAR | Status: AC | PRN
  Administered 2013-08-03: 100 mL via INTRAVENOUS

## 2013-08-03 MED ORDER — NITROGLYCERIN 0.4 MG SL SUBL: 0.4000 mg | SUBLINGUAL_TABLET | SUBLINGUAL | Status: DC | PRN

## 2013-08-03 MED ORDER — ENOXAPARIN SODIUM 100 MG/ML ~~LOC~~ SOLN
90.0000 mg | Freq: Two times a day (BID) | SUBCUTANEOUS | Status: DC
  Administered 2013-08-04: 90 mg via SUBCUTANEOUS
  Filled 2013-08-03 (×3): qty 1

## 2013-08-03 MED ORDER — MAGIC MOUTHWASH W/LIDOCAINE
5.0000 mL | Freq: Four times a day (QID) | ORAL | Status: DC | PRN
  Administered 2013-08-03 – 2013-08-04 (×3): 5 mL via ORAL
  Filled 2013-08-03 (×3): qty 5

## 2013-08-03 MED ORDER — ENOXAPARIN (LOVENOX) PATIENT EDUCATION KIT
PACK | Freq: Once | Status: AC
  Administered 2013-08-03: 16:00:00
  Filled 2013-08-03: qty 1

## 2013-08-03 MED ORDER — HEPARIN SODIUM (PORCINE) 5000 UNIT/ML IJ SOLN
60.0000 [IU]/kg | Freq: Once | INTRAMUSCULAR | Status: DC
Start: 2013-08-03 — End: 2013-08-03

## 2013-08-03 MED ORDER — ATORVASTATIN CALCIUM 40 MG PO TABS
40.0000 mg | ORAL_TABLET | Freq: Every day | ORAL | Status: DC
  Administered 2013-08-03: 40 mg via ORAL
  Filled 2013-08-03 (×2): qty 1

## 2013-08-03 MED ORDER — SODIUM CHLORIDE 0.9 % IV SOLN
Freq: Once | INTRAVENOUS | Status: AC
  Administered 2013-08-03: 14:00:00 via INTRAVENOUS

## 2013-08-03 MED ORDER — HEPARIN (PORCINE) IN NACL 100-0.45 UNIT/ML-% IJ SOLN
1500.0000 [IU]/h | INTRAMUSCULAR | Status: DC
  Administered 2013-08-03: 1500 [IU]/h via INTRAVENOUS
  Filled 2013-08-03 (×2): qty 250

## 2013-08-03 MED ORDER — ENOXAPARIN SODIUM 100 MG/ML ~~LOC~~ SOLN
90.0000 mg | Freq: Once | SUBCUTANEOUS | Status: AC
  Administered 2013-08-03: 90 mg via SUBCUTANEOUS
  Filled 2013-08-03: qty 1

## 2013-08-03 MED ORDER — HEPARIN BOLUS VIA INFUSION
4000.0000 [IU] | Freq: Once | INTRAVENOUS | Status: AC
Start: 2013-08-03 — End: 2013-08-03
  Administered 2013-08-03: 4000 [IU] via INTRAVENOUS
  Filled 2013-08-03: qty 4000

## 2013-08-03 MED ORDER — HYDROCODONE-ACETAMINOPHEN 7.5-325 MG/15ML PO SOLN
10.0000 mL | Freq: Four times a day (QID) | ORAL | Status: DC | PRN
  Administered 2013-08-03: 20 mL via ORAL
  Administered 2013-08-03: 15 mL via ORAL
  Administered 2013-08-04 (×2): 20 mL via ORAL
  Filled 2013-08-03 (×3): qty 30
  Filled 2013-08-03: qty 15
  Filled 2013-08-03: qty 30

## 2013-08-03 MED ORDER — INSULIN ASPART 100 UNIT/ML ~~LOC~~ SOLN
0.0000 [IU] | Freq: Three times a day (TID) | SUBCUTANEOUS | Status: DC
  Administered 2013-08-04: 1 [IU] via SUBCUTANEOUS

## 2013-08-03 MED ORDER — SODIUM CHLORIDE 0.9 % IJ SOLN: 3.0000 mL | Freq: Two times a day (BID) | INTRAMUSCULAR | Status: DC

## 2013-08-03 MED ORDER — POLYETHYLENE GLYCOL 3350 17 G PO PACK
17.0000 g | PACK | Freq: Every day | ORAL | Status: DC | PRN
  Filled 2013-08-03: qty 1

## 2013-08-03 MED ORDER — DILTIAZEM HCL 30 MG PO TABS
30.0000 mg | ORAL_TABLET | Freq: Four times a day (QID) | ORAL | Status: DC
  Administered 2013-08-03 – 2013-08-04 (×4): 30 mg via ORAL
  Filled 2013-08-03 (×7): qty 1

## 2013-08-03 MED ORDER — BISACODYL 10 MG RE SUPP: 10.0000 mg | Freq: Every day | RECTAL | Status: DC | PRN

## 2013-08-03 MED ORDER — IPRATROPIUM-ALBUTEROL 0.5-2.5 (3) MG/3ML IN SOLN
3.0000 mL | Freq: Three times a day (TID) | RESPIRATORY_TRACT | Status: DC
  Administered 2013-08-03 – 2013-08-04 (×3): 3 mL via RESPIRATORY_TRACT
  Filled 2013-08-03 (×4): qty 3

## 2013-08-03 NOTE — Progress Notes (Signed)
Attempted to get report from ED, no answer.  Will try to call back.

## 2013-08-03 NOTE — H&P (Addendum)
[facility] History and Physical  [name] FIE:[id number] DOB: 02-09-1956 DOA: 08/03/2013  Referring physician:  Harvie Heck PCP:  [name], MD   Chief Complaint:  SOB  HPI:  The patient is a 58 y.o. year-old male with history of small cell lung CA dx 2014 s/p XRT and chemotherapy, COPD gold stage 2 on intermittent 2L home oxygen, CAD s/p 3 MIs and multiple stents, T2DM, CKD stage 3 who presents with SOB.  The patient was last at their baseline health several weeks ago.  He states he has been undergoing a staged esophageal dilation and during the dilation before last he developed some mild SOB.  Since that time, he has had progressive mild shortness of breath without wheezing, chest tightness, chest pain, cough, hemoptysis.  He states he started using his 2 L of home oxygen, but was still unable to complete activities such as brushing his teeth without sitting down. Sitting up makes his shortness of breath better. He denies any lower extremity edema. He underwent a routine CT of the chest with contrast for staging purposes this morning, and it demonstrated a moderate bilateral pulmonary embolism without evidence of right heart strain. He was referred to the emergency department to initiate anticoagulation.   In the emergency department, HR 103, BP 114/73, R 16, 79% on RA.  Hemoglobin was 11.6, platelets 131, proBNP was 2537, markedly elevated from prior. Head CT demonstrated no evidence of malignancy. He was given a heparin bolus and started on continuous infusion. He is being admitted for initiation of anticoagulatio and to determine what his home oxygen needs will be.    Review of Systems:  General:  Denies fevers, chills, weight loss or gain HEENT:  Denies changes to hearing and vision, rhinorrhea, sinus congestion, sore throat CV:  Denies chest pain and palpitations, lower extremity edema.  PULM:  + SOB.  denies wheezing, cough.   GI:  Denies nausea, vomiting,  constipation, diarrhea.   GU:  Denies dysuria, frequency, urgency ENDO:  Denies polyuria, polydipsia.   HEME:  Denies hematemesis, blood in stools, melena, abnormal bruising or bleeding.  LYMPH:  Denies lymphadenopathy.   MSK:  Denies arthralgias, myalgias.   DERM:  Denies skin rash or ulcer.   NEURO:  Denies focal numbness, weakness, slurred speech, confusion, facial droop.  PSYCH:  Denies anxiety and depression.    Past Medical History  Diagnosis Date  . CAD (coronary artery disease)     a. PCI 3/08 to OM2 and mid CFX; b.  NSTEMI 6/10:  Prior stents patent, 90% dCFX,  mRCA occl (old) with collats; EF 55% on LV-gram => Xience DES 2.5 x 23 mm to distal CFX;  c. abnl MV 12/13 => LHC 04/23/12:  pLAD 50%, oOM1 80%, long stented segment of the CFX extending into the PLOM with the PLOM totally occluded ostially, CFX 70% ISR, mCFX 80% ISR, pRCA chronically occluded. Med Rx planned  . Diabetes mellitus type II   . Hyperlipidemia   . COPD (chronic obstructive pulmonary disease)     Quit tobacco 09/2009; Gold Stage 2 with asthma component - FEV1 1.53 L/54%, 14% fev1 BD response, DLCO 54% - July 2011; MM genotype 01/07/10; unable to afford spiriva and unwilling to try ics due to prior renal failure: state to Dr. [name], Aug 2011; started on atrovent HFA fall 2011. no desturation on walk test Dec 2011  . CKD (chronic kidney disease)   . Obesity   . History of CVA (cerebrovascular accident) 2007  Without residual deficits  . 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 no further rx  . CHF (congestive heart failure)   . Myocardial infarction     x3  . Hypertension   . Asthma     as child  . Shortness of  breath     constant/wears 2 liters  . Stroke     7 years- no problems   . Cancer     lung cancer-02/14/2013  . Blood transfusion without reported diagnosis 2015  . Radiation 03/16/13-04/28/13    Left lung 60Gy  . Pneumonia     02/14/2013 in hospital  . History of chemotherapy finished dec 2014  . Complication of anesthesia 05-04-14 woke up during esophagus stretching  . History of home oxygen therapy     uses oxygen 2 liters per [place] prn   Past Surgical History  Procedure Laterality Date  . Wedge resection  03/28/2010  . Coronary stent placement  2009    x 2  . Cardiac catheterization  2009  . Endobronchial ultrasound Bilateral 02/28/2013    Procedure: ENDOBRONCHIAL ULTRASOUND;  Surgeon: Brand Males, MD;  Location: [facility] ENDOSCOPY;  Service: Cardiopulmonary;  Laterality: Bilateral;  . Esophagogastroduodenoscopy N/A 05/04/2013    Procedure: ESOPHAGOGASTRODUODENOSCOPY (EGD);  Surgeon: Inda Castle, MD;  Location: [facility];  Service: Endoscopy;  Laterality: N/A;  . Esophagogastroduodenoscopy N/A 07/06/2013    Procedure: ESOPHAGOGASTRODUODENOSCOPY (EGD);  Surgeon: Inda Castle, MD;  Location: [facility];  Service: Endoscopy;  Laterality: N/A;  . Savory dilation N/A 07/06/2013    Procedure: SAVORY DILATION;  Surgeon: Inda Castle, MD;  Location: [facility];  Service: Endoscopy;  Laterality: N/A;  . Esophagogastroduodenoscopy N/A 07/14/2013    Procedure: ESOPHAGOGASTRODUODENOSCOPY (EGD);  Surgeon: Inda Castle, MD;  Location: [facility];  Service: Endoscopy;  Laterality: N/A;  . Savory dilation N/A 07/14/2013    Procedure: SAVORY DILATION;  Surgeon: Inda Castle, MD;  Location: [facility];  Service: Endoscopy;  Laterality: N/A;  . Esophagogastroduodenoscopy N/A 07/26/2013    Procedure: ESOPHAGOGASTRODUODENOSCOPY (EGD);  Surgeon: Inda Castle, MD;  Location: [facility];  Service: Endoscopy;  Laterality: N/A;  . Savory dilation N/A 07/26/2013    Procedure: SAVORY  DILATION;  Surgeon: Inda Castle, MD;  Location: [facility];  Service: Endoscopy;  Laterality: N/A;   Social History:  reports that he quit smoking about 4 years ago. His smoking use included Cigarettes. He has a 40 pack-year smoking history. He has never used smokeless tobacco. He reports that he does not drink alcohol or use illicit drugs. Single, lives with a friend upstairs.  Has home oxygen. Does not use cane or walker.  Drove until esophagus dilations, so not recently.    Allergies  Allergen Reactions  . Erythromycin Other (See Comments)    Reaction while in hospital - pt doesn't know reaction  . Fentanyl Other (See Comments)    Delirium and violent  . Penicillins Other (See Comments)    Childhood reaction - unknown    Family History  Problem Relation Age of Onset  . Stroke Other   . Diabetes Mother   . Cancer Maternal Uncle     esophagus  . Cancer Maternal Grandfather  esophagus  . Heart disease Mother   . Deep vein thrombosis Sister      Prior to Admission medications   Medication Sig Start Date End Date Taking? Authorizing Provider  albuterol (PROVENTIL HFA;VENTOLIN HFA) 108 (90 BASE) MCG/ACT inhaler Inhale into the lungs every 6 (six) hours as needed for wheezing or shortness of breath.   Yes Historical Provider, MD  albuterol (PROVENTIL) (2.5 MG/3ML) 0.083% nebulizer solution Take 2.5 mg by nebulization every 6 (six) hours as needed for wheezing or shortness of breath.   Yes Historical Provider, MD  Alum & Mag Hydroxide-Simeth (MAGIC MOUTHWASH W/LIDOCAINE) SOLN Take 5 mLs by mouth 4 (four) times daily as needed for mouth pain.   Yes Historical Provider, MD  atorvastatin (LIPITOR) 40 MG tablet Take 40 mg by mouth daily at 6 PM.   Yes Historical Provider, MD  diltiazem (DILACOR XR) 240 MG 24 hr capsule Take 240 mg by mouth every morning.   Yes Historical Provider, MD  glipiZIDE (GLUCOTROL) 10 MG tablet Take 10 mg by mouth daily before breakfast.    Yes Historical  Provider, MD  HYDROcodone-acetaminophen (HYCET) 7.5-325 mg/15 ml solution Take 10 mLs by mouth 4 (four) times daily as needed for moderate pain. 07/26/13  Yes Thea Silversmith, MD  isosorbide mononitrate (IMDUR) 60 MG 24 hr tablet Take 60 mg by mouth every morning.   Yes Historical Provider, MD  metoprolol tartrate (LOPRESSOR) 25 MG tablet Take 25 mg by mouth 2 (two) times daily.   Yes Historical Provider, MD  nitroGLYCERIN (NITROSTAT) 0.4 MG SL tablet Place 0.4 mg under the tongue every 5 (five) minutes as needed for chest pain. May repeat up to 3 doses   Yes Historical Provider, MD  oxyCODONE (OXY IR/ROXICODONE) 5 MG immediate release tablet Take 1 tablet (5 mg total) by mouth every 4 (four) hours as needed for severe pain. 07/15/13  Yes Thea Silversmith, MD  pantoprazole (PROTONIX) 40 MG tablet Take 40 mg by mouth 2 (two) times daily.   Yes Historical Provider, MD   Physical Exam: Filed Vitals:   08/03/13 1122 08/03/13 1135 08/03/13 1307 08/03/13 1327  BP:    129/78  Pulse:      Temp:    97.8 F (36.6 C)  TempSrc:    Oral  Resp:    16  Height:   5\' 10"  (1.778 m)   Weight:   89.812 kg (198 lb)   SpO2: 79% 98%  98%     General:  CM, NAD, nasal canula in place  Eyes:  PERRL, anicteric, non-injected.  ENT:  Nares clear.  mild tonsillar and posterior OP erythema without plaque, ulceration, or exudate.  MMM.  Neck:  Supple without TM or JVD.    Lymph:  No cervical, supraclavicular, or submandibular LAD.  Cardiovascular:  RRR, normal S1, S2, without m/r/g.  2+ pulses, warm extremities  Respiratory:  mid expiratory wheeze, no focal rales or rhonchi, no increased WOB.  Abdomen:  NABS.  Soft, ND/NT.    Skin:  No rashes or focal lesions.  Musculoskeletal:  Normal bulk and tone.  No LE edema.  Psychiatric:  A & O x 4.  Appropriate affect.  Neurologic:  CN 3-12 intact.  5/5 strength.  Sensation intact.  Labs on Admission:  Basic Metabolic Panel:  Recent Labs Lab 08/03/13 1130   NA 137  K 4.1  CL 99  CO2 25  GLUCOSE 125*  BUN 12  CREATININE 1.31  CALCIUM 9.4   Liver Function Tests:  No results found for this basename: AST, ALT, ALKPHOS, BILITOT, PROT, ALBUMIN,  in the last 168 hours No results found for this basename: LIPASE, AMYLASE,  in the last 168 hours No results found for this basename: AMMONIA,  in the last 168 hours CBC:  Recent Labs Lab 08/03/13 1130  WBC 5.8  HGB 11.6*  HCT 35.2*  MCV 95.7  PLT 131*   Cardiac Enzymes: No results found for this basename: CKTOTAL, CKMB, CKMBINDEX, TROPONINI,  in the last 168 hours  BNP (last 3 results)  Recent Labs  02/14/13 1953 04/28/13 1430 08/03/13 1130  PROBNP 290.3* 678.8* 2537.0*   CBG: No results found for this basename: GLUCAP,  in the last 168 hours  Radiological Exams on Admission: Ct Head W Wo Contrast  08/03/2013   CLINICAL DATA:  58 year old male with small cell carcinoma of the lung. Restaging. Subsequent encounter.  EXAM: CT HEAD WITHOUT AND WITH CONTRAST  TECHNIQUE: Contiguous axial images were obtained from the base of the skull through the vertex without and with intravenous contrast  CONTRAST:  191mL OMNIPAQUE IOHEXOL 300 MG/ML SOLN in conjunction with contrast enhanced imaging of the chest reported separately.  COMPARISON:  Brain MRI 03/14/2013.  FINDINGS: Visible paranasal sinuses and mastoids are stable and clear. Negative visualized osseous structures. Negative scalp and orbits soft tissues.  Stable cerebral volume. No midline shift, ventriculomegaly, mass effect, evidence of mass lesion, intracranial hemorrhage or evidence of cortically based acute infarction. Gray-white matter differentiation is within normal limits throughout the brain. No abnormal enhancement identified. Major intracranial vascular structures are enhancing.  IMPRESSION: 1. Stable and normal CT appearance of the brain. 2. Chest CT from today reported separately.   Electronically Signed   By: Lars Pinks M.D.   On:  08/03/2013 13:47   Ct Chest W Contrast  08/03/2013   CLINICAL DATA:  Small cell lung cancer diagnosed 02/2013, chemotherapy and XRT complete, shortness of breath, dysphagia, weight loss.  EXAM: CT CHEST WITH CONTRAST  TECHNIQUE: Multidetector CT imaging of the chest was performed during intravenous contrast administration.  CONTRAST:  111mL OMNIPAQUE IOHEXOL 300 MG/ML  SOLN  COMPARISON:  CT chest dated 05/02/2013  FINDINGS: Although study was not tailored for optimal evaluation of the pulmonary arteries, there are bilateral pulmonary emboli involving the right middle lobe and bilateral lower lobe pulmonary arteries, as well as the lingular branch. Overall clot burden is moderate. No findings to suggest right heart strain, with an RV-to-LV ratio of 0.77.  Postsurgical changes related to prior right lung wedge resection. Radiation changes in the superior segment left lower lobe.  New nodularity in the lingula measuring up to 1.7 x 1.5 cm (series 11/ image 21). Additional 11 mm irregular nodular opacity in the left lower lobe (series 11/ image 49), new).  Moderate to severe centrilobular and paraseptal emphysematous changes. Mild scarring in the anterolateral right lung base (series 11/ image 40), unchanged. No pleural effusion or pneumothorax.  Visualized thyroid is unremarkable.  Heart is normal in size. Small pericardial effusion, new/increased. Atherosclerotic calcifications of the aortic arch. Coronary atherosclerosis.  1.4 cm Othon Guardia axis right paratracheal node (series 5/ image 10), previously 1.2 cm.  Wall thickening/edema involving the mid esophagus (series 5/image 27), unchanged.  Visualized upper abdomen is unremarkable.  Degenerative changes of the visualized thoracolumbar spine. Multiple right rib defects related to prior right thoracotomy.  IMPRESSION: Bilateral pulmonary emboli, as described above. Overall clot burden is moderate.  Prior right lung wedge resection with radiation changes in  the superior  segment left lower lobe.  New nodularity in the lingula and left lower lobe, measuring up to 1.7 cm. While nonspecific, possibly post infectious/inflammatory and/or related to radiation changes, metastatic disease is not excluded. Attention on follow-up is suggested.  Mildly progressive right paratracheal lymphadenopathy measuring up to 1.4 cm, worrisome for nodal recurrence.  Critical value/emergent results were called by telephone at the time of interpretation on 08/03/2013 at 10:50 AM to Dr. Curt Bears , who verbally acknowledged these results. At his request, the patient was notified of the results and sent to the ED.   Electronically Signed   By: Julian Hy M.D.   On: 08/03/2013 11:07    EKG:  pending  Assessment/Plan Active Problems:   Pulmonary embolism  ---  Acute hypoxic respiratory failure secondary to pulmonary embolism, moderate clot burden with elevated BNP -  D/c heparin -  LMWH is preferred a/c in setting of malignancy so start BID dosing per pharmacy -  Case management to ensure patient able to afford -  RN to perform teaching -  Repeat CBC in AM -  O2 to keep O2 sat > 88% and should walk patient tomorrow to check O2 with ambulation >> may need to make adjustments to home oxygen Rx  -  ECHO  Elevated proBNP suggestive of heart strain or heart failure.  Pt also has enlarging pericardial effusion.   -  ECHO  SCLC, per Oncology.  Case discussed with Dr. Julien Nordmann.    CAD, stable.  Continue BB, imdur, statin.  Hold asa/plavix due to recent procedure and starting a/c  A-fib with RVR during previous admission -  Continue BB -  D/c long-acting dilt (patient had been crushing) and start q6h dilt at 30mg  and titrate up to 60 if he tolerates  COPD without exacerbation and with some wheezing.  Start duonebs.  If he develops any additional sx to suggest exacerbation, start steroids/abx.    T2DM, a1c 8.4 in 04/2013  -  hold oral meds and start low dose SSI -  Check  A1c  CKD stage 3, stable.  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  Continue regular diet with supplements  Diet:  Dysphagia 2 with thin Access:  PIV IVF:  off Proph:  lovenox   Code Status: full Family Communication: patient alone Disposition Plan: Admit to telemetry.  Anticipate home in 1-2 days.    Time spent: 60 min Janece Canterbury [facility] Pager [phone]  If 7PM-7AM, please contact night-coverage [email or web address] Password [facility] 08/03/2013, 2:38 PM

## 2013-08-03 NOTE — Progress Notes (Signed)
  Echocardiogram 2D Echocardiogram has been performed.  Basilia Jumbo 08/03/2013, 3:29 PM

## 2013-08-03 NOTE — Progress Notes (Signed)
ANTICOAGULATION CONSULT NOTE - Initial Consult  Pharmacy Consult for Heparin Indication: pulmonary embolus  Allergies  Allergen Reactions  . Erythromycin Other (See Comments)    Reaction while in hospital - pt doesn't know reaction  . Fentanyl Other (See Comments)    Delirium and violent  . Penicillins Other (See Comments)    Childhood reaction - unknown    Patient Measurements: Height: 5\' 10"  (177.8 cm) Weight: 198 lb (89.812 kg) IBW/kg (Calculated) : 73 Heparin Dosing Weight: 89.8 kg  Vital Signs: Temp: 97.7 F (36.5 C) (03/25 1105) Temp src: Oral (03/25 1105) BP: 114/73 mmHg (03/25 1105) Pulse Rate: 103 (03/25 1105)  Labs:  Recent Labs  08/03/13 1130  HGB 11.6*  HCT 35.2*  PLT 131*  APTT 27  LABPROT 14.0  INR 1.10  CREATININE 1.31    Estimated Creatinine Clearance: 70.1 ml/min (by C-G formula based on Cr of 1.31).   Medical History: Past Medical History  Diagnosis Date  . CAD (coronary artery disease)     a. PCI 3/08 to OM2 and mid CFX; b.  NSTEMI 6/10:  Prior stents patent, 90% dCFX,  mRCA occl (old) with collats; EF 55% on LV-gram => Xience DES 2.5 x 23 mm to distal CFX;  c. abnl MV 12/13 => LHC 04/23/12:  pLAD 50%, oOM1 80%, long stented segment of the CFX extending into the PLOM with the PLOM totally occluded ostially, CFX 70% ISR, mCFX 80% ISR, pRCA chronically occluded. Med Rx planned  . Diabetes mellitus type II   . Hyperlipidemia   . COPD (chronic obstructive pulmonary disease)     Quit tobacco 09/2009; Gold Stage 2 with asthma component - FEV1 1.53 L/54%, 14% fev1 BD response, DLCO 54% - July 2011; MM genotype 01/07/10; unable to afford spiriva and unwilling to try ics due to prior renal failure: state to Dr. [name], Aug 2011; started on atrovent HFA fall 2011. no desturation on walk test Dec 2011  . CKD (chronic kidney disease)   . Obesity   . History of CVA (cerebrovascular accident) 2007    Without residual defecits  . 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 no further rx  . CHF (congestive heart failure)   . Myocardial infarction     x3  . Hypertension   . Asthma     as child  . Shortness of breath     constant/wears 2 liters  . Stroke     7 years- no problems   . Cancer     lung cancer-02/14/2013  . Blood transfusion without reported diagnosis 2015  . Radiation 03/16/13-04/28/13    Left lung 60Gy  . Pneumonia     02/14/2013 in hospital  . History of chemotherapy finished dec 2014  . Complication of anesthesia 05-04-14 woke up during esophagus stretching  . History of home oxygen therapy     uses oxygen 2 liters per [place] prn    Medications:  Scheduled:   Infusions:  . heparin    . heparin      Assessment: 58 yo male with history of lung cancer s/p chemo and radiation presents with shortness of breath. CT reveals bilateral PE.  Baseline coags wnl  No  anticoagulants PTA  H/H ok, Plt low but improved from January s/p chemo  Goal of Therapy:  Heparin level 0.3-0.7 units/ml Monitor platelets by anticoagulation protocol: Yes   Plan:   Heparin 4000 units IV bolus x 1, then  Heparin 1500 units/hr  Check heparin level 6hrs after starting  Daily heparin level, CBC  Peggyann Juba, PharmD, BCPS Pager: [phone] 08/03/2013,1:10 PM

## 2013-08-03 NOTE — Progress Notes (Signed)
Heparin drip documented as stopped prior to patient arriving to floor from ED, however drip was still infusing.  Documented that drip was discontinued at the time it was stopped by Probation officer [phone]) and as discussed with Pharmacist.  Will continue to monitor.

## 2013-08-03 NOTE — ED Provider Notes (Signed)
CSN: [id number]     Arrival date & time 08/03/13  1057 History   First MD Initiated Contact with Patient 08/03/13 1121     Chief Complaint  Patient presents with  . PE, hx lung cancer      (Consider location/radiation/quality/duration/timing/severity/associated sxs/prior Treatment) HPI Comments: [name] is a 58 y.o. male with a past medical history of Lung Cancer s/p chemotherapy and radiation, COPD presenting the Emergency Department with a chief complaint of Pulmonary Embolism.  He reports an out-pt CT for further evaluation of his lung cancer treatment when he was told to come to the ED for a PE. The patient reports increased dyspnea with ambulating and exertion for 2 weeks.  He reports occasional home oxygen use 2-2.5 L/min.  He denies history of DVT or PE.  Denies taking anticoagulation medication.  Denies history of GI bleed, black or melenotic stools, and BRBPR.   The history is provided by the patient and medical records. No language interpreter was used.    Past Medical History  Diagnosis Date  . CAD (coronary artery disease)     a. PCI 3/08 to OM2 and mid CFX; b.  NSTEMI 6/10:  Prior stents patent, 90% dCFX,  mRCA occl (old) with collats; EF 55% on LV-gram => Xience DES 2.5 x 23 mm to distal CFX;  c. abnl MV 12/13 => LHC 04/23/12:  pLAD 50%, oOM1 80%, long stented segment of the CFX extending into the PLOM with the PLOM totally occluded ostially, CFX 70% ISR, mCFX 80% ISR, pRCA chronically occluded. Med Rx planned  . Diabetes mellitus type II   . Hyperlipidemia   . COPD (chronic obstructive pulmonary disease)     Quit tobacco 09/2009; Gold Stage 2 with asthma component - FEV1 1.53 L/54%, 14% fev1 BD response, DLCO 54% - July 2011; MM genotype 01/07/10; unable to afford spiriva and unwilling to try ics due to prior renal failure: state to Dr. [name], Aug 2011; started on atrovent HFA fall 2011. no desturation on walk test Dec 2011  . CKD (chronic kidney disease)   . Obesity    . History of CVA (cerebrovascular accident) 2007    Without residual defecits  . 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 no further rx  . CHF (congestive heart failure)   . Myocardial infarction     x3  . Hypertension   . Asthma     as child  . Shortness of breath     constant/wears 2 liters  . Stroke     7 years- no problems   . Cancer     lung cancer-02/14/2013  . Blood transfusion without reported diagnosis 2015  . Radiation 03/16/13-04/28/13    Left lung 60Gy  . Pneumonia     02/14/2013 in hospital  . History of chemotherapy finished dec 2014  . Complication of anesthesia 05-04-14 woke up during esophagus stretching  . History of home oxygen therapy     uses oxygen 2 liters per [place] prn   Past Surgical History  Procedure Laterality Date  . Wedge resection  03/28/2010  . Coronary stent placement  2009    x 2  . Cardiac catheterization  2009  .  Endobronchial ultrasound Bilateral 02/28/2013    Procedure: ENDOBRONCHIAL ULTRASOUND;  Surgeon: Brand Males, MD;  Location: [facility];  Service: Cardiopulmonary;  Laterality: Bilateral;  . Esophagogastroduodenoscopy N/A 05/04/2013    Procedure: ESOPHAGOGASTRODUODENOSCOPY (EGD);  Surgeon: Inda Castle, MD;  Location: [facility];  Service: Endoscopy;  Laterality: N/A;  . Esophagogastroduodenoscopy N/A 07/06/2013    Procedure: ESOPHAGOGASTRODUODENOSCOPY (EGD);  Surgeon: Inda Castle, MD;  Location: [facility];  Service: Endoscopy;  Laterality: N/A;  . Savory dilation N/A 07/06/2013    Procedure: SAVORY DILATION;  Surgeon: Inda Castle, MD;  Location: [facility];  Service: Endoscopy;  Laterality: N/A;  .  Esophagogastroduodenoscopy N/A 07/14/2013    Procedure: ESOPHAGOGASTRODUODENOSCOPY (EGD);  Surgeon: Inda Castle, MD;  Location: [facility];  Service: Endoscopy;  Laterality: N/A;  . Savory dilation N/A 07/14/2013    Procedure: SAVORY DILATION;  Surgeon: Inda Castle, MD;  Location: [facility];  Service: Endoscopy;  Laterality: N/A;  . Esophagogastroduodenoscopy N/A 07/26/2013    Procedure: ESOPHAGOGASTRODUODENOSCOPY (EGD);  Surgeon: Inda Castle, MD;  Location: [facility];  Service: Endoscopy;  Laterality: N/A;  . Savory dilation N/A 07/26/2013    Procedure: SAVORY DILATION;  Surgeon: Inda Castle, MD;  Location: [facility];  Service: Endoscopy;  Laterality: N/A;   Family History  Problem Relation Age of Onset  . Stroke Other   . Diabetes Mother   . Cancer Maternal Uncle     esophagus  . Cancer Maternal Grandfather     esophagus   History  Substance Use Topics  . Smoking status: Former Smoker -- 1.00 packs/day for 40 years    Types: Cigarettes    Quit date: 09/09/2009  . Smokeless tobacco: Never Used  . Alcohol Use: No     Comment: rarely    Review of Systems  Constitutional: Negative for fever and chills.  Respiratory: Positive for shortness of breath. Negative for cough and chest tightness.   Cardiovascular: Negative for chest pain, palpitations and leg swelling.  Gastrointestinal: Negative for vomiting, abdominal pain, diarrhea and blood in stool.  Neurological: Negative for syncope.      Allergies  Erythromycin; Fentanyl; and Penicillins  Home Medications   Current Outpatient Rx  Name  Route  Sig  Dispense  Refill  . acetaminophen (TYLENOL) 160 MG/5ML solution   Oral   Take 320 mg by mouth every 6 (six) hours as needed for mild pain.         Marland Kitchen albuterol (PROVENTIL HFA;VENTOLIN HFA) 108 (90 BASE) MCG/ACT inhaler   Inhalation   Inhale into the lungs every 6 (six) hours as needed for wheezing or shortness of breath.         Marland Kitchen albuterol  (PROVENTIL) (2.5 MG/3ML) 0.083% nebulizer solution   Nebulization   Take 2.5 mg by nebulization every 6 (six) hours as needed for wheezing or shortness of breath.         . Alum & Mag Hydroxide-Simeth (MAGIC MOUTHWASH W/LIDOCAINE) SOLN   Oral   Take 5 mLs by mouth 4 (four) times daily as needed for mouth pain.         Marland Kitchen atorvastatin (LIPITOR) 40 MG tablet   Oral   Take 40 mg by mouth daily at 6 PM.         . diltiazem (DILACOR XR) 240 MG 24 hr capsule   Oral   Take 240 mg by mouth every morning.         Marland Kitchen glipiZIDE (GLUCOTROL) 10 MG tablet  Oral   Take 10 mg by mouth daily before breakfast.          . HYDROcodone-acetaminophen (HYCET) 7.5-325 mg/15 ml solution   Oral   Take 10 mLs by mouth 4 (four) times daily as needed for moderate pain.   473 mL   0   . isosorbide mononitrate (IMDUR) 60 MG 24 hr tablet   Oral   Take 60 mg by mouth every morning.         . metoprolol tartrate (LOPRESSOR) 25 MG tablet   Oral   Take 25 mg by mouth 2 (two) times daily.         . nitroGLYCERIN (NITROSTAT) 0.4 MG SL tablet   Sublingual   Place 0.4 mg under the tongue every 5 (five) minutes as needed for chest pain. May repeat up to 3 doses         . oxyCODONE (OXY IR/ROXICODONE) 5 MG immediate release tablet   Oral   Take 1 tablet (5 mg total) by mouth every 4 (four) hours as needed for severe pain.   60 tablet   0   . pantoprazole (PROTONIX) 40 MG tablet   Oral   Take 40 mg by mouth 2 (two) times daily.          BP 114/73  Pulse 103  Temp(Src) 97.7 F (36.5 C) (Oral)  Resp 16  SpO2 98% Physical Exam  Nursing note and vitals reviewed. Constitutional: He is oriented to person, place, and time. He appears well-developed and well-nourished. No distress.  HENT:  Head: Normocephalic and atraumatic.  Eyes: EOM are normal. Pupils are equal, round, and reactive to light. No scleral icterus.  Neck: Neck supple.  Cardiovascular: Regular rhythm and normal heart  sounds.  Tachycardia present.   No murmur heard. Pulses:      Radial pulses are 2+ on the right side, and 2+ on the left side.  Trace bilateral pitting edema  Pulmonary/Chest: Effort normal and breath sounds normal. He has no wheezes. He has no rales. He exhibits no tenderness.  Abdominal: Soft. Bowel sounds are normal. There is no tenderness. There is no rebound and no guarding.  Musculoskeletal: Normal range of motion. He exhibits no edema.  Neurological: He is alert and oriented to person, place, and time.  Skin: Skin is warm and dry. No rash noted.  Psychiatric: He has a normal mood and affect.    ED Course  Procedures (including critical care time) Labs Review Labs Reviewed  CBC - Abnormal; Notable for the following:    RBC 3.68 (*)    Hemoglobin 11.6 (*)    HCT 35.2 (*)    Platelets 131 (*)    All other components within normal limits  BASIC METABOLIC PANEL - Abnormal; Notable for the following:    Glucose, Bld 125 (*)    GFR calc non Af Amer 59 (*)    GFR calc Af Amer 68 (*)    All other components within normal limits  PRO B NATRIURETIC PEPTIDE - Abnormal; Notable for the following:    Pro B Natriuretic peptide (BNP) 2537.0 (*)    All other components within normal limits  PROTIME-INR  APTT   Imaging Review Ct Chest W Contrast  08/03/2013   CLINICAL DATA:  Small cell lung cancer diagnosed 02/2013, chemotherapy and XRT complete, shortness of breath, dysphagia, weight loss.  EXAM: CT CHEST WITH CONTRAST  TECHNIQUE: Multidetector CT imaging of the chest was performed during intravenous contrast administration.  CONTRAST:  116mL OMNIPAQUE IOHEXOL 300 MG/ML  SOLN  COMPARISON:  CT chest dated 05/02/2013  FINDINGS: Although study was not tailored for optimal evaluation of the pulmonary arteries, there are bilateral pulmonary emboli involving the right middle lobe and bilateral lower lobe pulmonary arteries, as well as the lingular branch. Overall clot burden is moderate. No  findings to suggest right heart strain, with an RV-to-LV ratio of 0.77.  Postsurgical changes related to prior right lung wedge resection. Radiation changes in the superior segment left lower lobe.  New nodularity in the lingula measuring up to 1.7 x 1.5 cm (series 11/ image 21). Additional 11 mm irregular nodular opacity in the left lower lobe (series 11/ image 49), new).  Moderate to severe centrilobular and paraseptal emphysematous changes. Mild scarring in the anterolateral right lung base (series 11/ image 40), unchanged. No pleural effusion or pneumothorax.  Visualized thyroid is unremarkable.  Heart is normal in size. Small pericardial effusion, new/increased. Atherosclerotic calcifications of the aortic arch. Coronary atherosclerosis.  1.4 cm short axis right paratracheal node (series 5/ image 10), previously 1.2 cm.  Wall thickening/edema involving the mid esophagus (series 5/image 27), unchanged.  Visualized upper abdomen is unremarkable.  Degenerative changes of the visualized thoracolumbar spine. Multiple right rib defects related to prior right thoracotomy.  IMPRESSION: Bilateral pulmonary emboli, as described above. Overall clot burden is moderate.  Prior right lung wedge resection with radiation changes in the superior segment left lower lobe.  New nodularity in the lingula and left lower lobe, measuring up to 1.7 cm. While nonspecific, possibly post infectious/inflammatory and/or related to radiation changes, metastatic disease is not excluded. Attention on follow-up is suggested.  Mildly progressive right paratracheal lymphadenopathy measuring up to 1.4 cm, worrisome for nodal recurrence.  Critical value/emergent results were called by telephone at the time of interpretation on 08/03/2013 at 10:50 AM to Dr. Curt Bears , who verbally acknowledged these results. At his request, the patient was notified of the results and sent to the ED.   Electronically Signed   By: Julian Hy M.D.   On:  08/03/2013 11:07     EKG Interpretation None      MDM   Final diagnoses:  Pulmonary embolism  Lung cancer   Pt sent to ED for an incidental PE found on an out-pt CT, performed to re-evaluate for lung cancer treatment. Oxygen 98% on 2L, [place]. HR 103. Discussed pt history and condition with Dr. Mingo Amber, advises PT, PTT labs, and heparin. Pt history and condition discussed with Dr. Maricela Bo who will evaluate the patient in the ED and agrees to admit the patient. Meds given in ED:  Medications - No data to display  New Prescriptions   No medications on file        Lorrine Kin, PA-C 08/05/13 1642

## 2013-08-03 NOTE — Progress Notes (Signed)
ANTICOAGULATION CONSULT NOTE - Initial Consult  Pharmacy Consult for Lovenox Indication: pulmonary embolus  Allergies  Allergen Reactions  . Erythromycin Other (See Comments)    Reaction while in hospital - pt doesn't know reaction  . Fentanyl Other (See Comments)    Delirium and violent  . Penicillins Other (See Comments)    Childhood reaction - unknown    Patient Measurements: Height: 5\' 10"  (177.8 cm) Weight: 198 lb (89.812 kg) IBW/kg (Calculated) : 73 Heparin Dosing Weight: 89.8 kg  Vital Signs: Temp: 97.8 F (36.6 C) (03/25 1327) Temp src: Oral (03/25 1327) BP: 129/78 mmHg (03/25 1327) Pulse Rate: 103 (03/25 1105)  Labs:  Recent Labs  08/03/13 1130  HGB 11.6*  HCT 35.2*  PLT 131*  APTT 27  LABPROT 14.0  INR 1.10  CREATININE 1.31    Estimated Creatinine Clearance: 70.1 ml/min (by C-G formula based on Cr of 1.31).   Medical History: Past Medical History  Diagnosis Date  . CAD (coronary artery disease)     a. PCI 3/08 to OM2 and mid CFX; b.  NSTEMI 6/10:  Prior stents patent, 90% dCFX,  mRCA occl (old) with collats; EF 55% on LV-gram => Xience DES 2.5 x 23 mm to distal CFX;  c. abnl MV 12/13 => LHC 04/23/12:  pLAD 50%, oOM1 80%, long stented segment of the CFX extending into the PLOM with the PLOM totally occluded ostially, CFX 70% ISR, mCFX 80% ISR, pRCA chronically occluded. Med Rx planned  . Diabetes mellitus type II   . Hyperlipidemia   . COPD (chronic obstructive pulmonary disease)     Quit tobacco 09/2009; Gold Stage 2 with asthma component - FEV1 1.53 L/54%, 14% fev1 BD response, DLCO 54% - July 2011; MM genotype 01/07/10; unable to afford spiriva and unwilling to try ics due to prior renal failure: state to Dr. [name], Aug 2011; started on atrovent HFA fall 2011. no desturation on walk test Dec 2011  . CKD (chronic kidney disease)   . Obesity   . History of CVA (cerebrovascular accident) 2007    Without residual deficits  . 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 no further rx  . CHF (congestive heart failure)   . Myocardial infarction     x3  . Hypertension   . Asthma     as child  . Shortness of breath     constant/wears 2 liters  . Stroke     7 years- no problems   . Cancer     lung cancer-02/14/2013  . Blood transfusion without reported diagnosis 2015  . Radiation 03/16/13-04/28/13    Left lung 60Gy  . Pneumonia     02/14/2013 in hospital  . History of chemotherapy finished dec 2014  . Complication of anesthesia 05-04-14 woke up during esophagus stretching  . History of home oxygen therapy     uses oxygen 2 liters per  prn    Assessment: 58 yo male with history of lung cancer s/p chemo and radiation presents with shortness of breath. CT reveals bilateral PE.  Pharmacy initially consulted to dose Heparin IV, then changed to Lovenox.  Baseline coags wnl  No anticoagulants PTA  CBC:  Hgb 11.6,  Plt 131 (low but improved from January s/p chemo)  SCr 1.3 with CrCl ~ 70 ml/min  Goal of Therapy:  Anti-Xa level 0.6-1 units/ml 4hrs after LMWH dose given Monitor platelets by anticoagulation protocol: Yes   Plan:   D/C Heparin (order d/c at ~1430, but drip will be turned off by RN at 1530)  Lovenox 90mg  SQ BID (first dose at 1630, one hour after heparin d/c, then q12h)  Follow renal function and daily [place] PharmD, BCPS Pager [phone] 08/03/2013 3:24 PM

## 2013-08-03 NOTE — Progress Notes (Signed)
Utilization Review completed.  Matei Magnone RN CM  

## 2013-08-03 NOTE — ED Notes (Signed)
Report given to Garvin, rn

## 2013-08-03 NOTE — ED Notes (Addendum)
Pt reports hx of lung cancer, MI, stroke, CAD. pt finished chemo and radiation at end of Jan 2015. Was getting CT today to check if cancer was gone. PE noted in pts lungs. Sent to ED. Pt reports slight SOB, increases with activity. Pt denies pain.   Reports SOB x1 week. Does wear oxygen at home when needed. pts O2 79% from walking from wheelchair to stretcher.

## 2013-08-04 ENCOUNTER — Ambulatory Visit: Payer: Self-pay | Admitting: Internal Medicine

## 2013-08-04 ENCOUNTER — Other Ambulatory Visit: Payer: Self-pay

## 2013-08-04 ENCOUNTER — Ambulatory Visit: Payer: BC Managed Care – PPO | Admitting: Radiation Oncology

## 2013-08-04 ENCOUNTER — Encounter: Payer: Self-pay | Admitting: Nutrition

## 2013-08-04 DIAGNOSIS — I4891 Unspecified atrial fibrillation: Secondary | ICD-10-CM

## 2013-08-04 DIAGNOSIS — J9620 Acute and chronic respiratory failure, unspecified whether with hypoxia or hypercapnia: Secondary | ICD-10-CM

## 2013-08-04 LAB — CBC
HCT: 34.7 % — ABNORMAL LOW (ref 39.0–52.0)
Hemoglobin: 11.1 g/dL — ABNORMAL LOW (ref 13.0–17.0)
MCH: 30.8 pg (ref 26.0–34.0)
MCHC: 32 g/dL (ref 30.0–36.0)
MCV: 96.4 fL (ref 78.0–100.0)
PLATELETS: 120 10*3/uL — AB (ref 150–400)
RBC: 3.6 MIL/uL — ABNORMAL LOW (ref 4.22–5.81)
RDW: 15.3 % (ref 11.5–15.5)
WBC: 4.1 10*3/uL (ref 4.0–10.5)

## 2013-08-04 LAB — GLUCOSE, CAPILLARY
GLUCOSE-CAPILLARY: 106 mg/dL — AB (ref 70–99)
GLUCOSE-CAPILLARY: 140 mg/dL — AB (ref 70–99)

## 2013-08-04 MED ORDER — IPRATROPIUM-ALBUTEROL 0.5-2.5 (3) MG/3ML IN SOLN: 3.0000 mL | Freq: Four times a day (QID) | RESPIRATORY_TRACT | Status: DC | PRN

## 2013-08-04 MED ORDER — DILTIAZEM HCL ER 180 MG PO CP24: 180.0000 mg | ORAL_CAPSULE | Freq: Every day | ORAL | Status: DC

## 2013-08-04 MED ORDER — ENOXAPARIN SODIUM 150 MG/ML ~~LOC~~ SOLN: 140.0000 mg | SUBCUTANEOUS | Status: DC

## 2013-08-04 MED ORDER — SODIUM CHLORIDE 0.9 % IV SOLN: INTRAVENOUS | Status: DC

## 2013-08-04 MED ORDER — MAGIC MOUTHWASH W/LIDOCAINE
5.0000 mL | ORAL | Status: DC | PRN
  Administered 2013-08-04: 5 mL via ORAL
  Filled 2013-08-04: qty 5

## 2013-08-04 MED ORDER — HYDROCODONE-ACETAMINOPHEN 7.5-325 MG/15ML PO SOLN: 10.0000 mL | Freq: Four times a day (QID) | ORAL | Status: DC | PRN

## 2013-08-04 MED ORDER — DILTIAZEM HCL 30 MG PO TABS: 30.0000 mg | ORAL_TABLET | Freq: Three times a day (TID) | ORAL | Status: DC

## 2013-08-04 NOTE — Discharge Summary (Signed)
Physician Discharge Summary  [name] VOH:[id number] DOB: 1955/08/30 DOA: 08/03/2013  PCP: [name], MD  Admit date: 08/03/2013 Discharge date: 08/04/2013  Recommendations for Outpatient Follow-up:  1. Follow up with Dr. Julien Nordmann in 1 week for repeat CBC and further staging tests if needed. Started lovenox. 2. Stopped glipizide and changed diltiazem to crushable version in case of difficulty swallowing.    Discharge Diagnoses:  Principal Problem:   Submassive pulmonary embolism Active Problems:   DM   CAD, NATIVE VESSEL   Severe chronic obstructive pulmonary disease   Small cell carcinoma of lung   Protein-calorie malnutrition, severe   Acute-on-chronic respiratory failure   Discharge Condition: stable, improved  Diet recommendation: dysphagia 2 with thin  Wt Readings from Last 3 Encounters:  08/03/13 89.812 kg (198 lb)  07/26/13 89.812 kg (198 lb)  07/26/13 89.812 kg (198 lb)    History of present illness:  The patient is a 58 y.o. year-old male with history of small cell lung CA dx 2014 s/p XRT and chemotherapy, COPD gold stage 2 on intermittent 2L home oxygen, CAD s/p 3 MIs and multiple stents, T2DM, CKD stage 3 who presents with SOB. The patient was last at their baseline health several weeks ago. He states he has been undergoing a staged esophageal dilation and during the dilation before last he developed some mild SOB. Since that time, he has had progressive mild shortness of breath without wheezing, chest tightness, chest pain, cough, hemoptysis. He states he started using his 2 L of home oxygen, but was still unable to complete activities such as brushing his teeth without sitting down. Sitting up makes his shortness of breath better. He denies any lower extremity edema. He underwent a routine CT of the chest with contrast for staging purposes this morning, and it demonstrated a moderate bilateral pulmonary embolism without evidence of right heart strain. He was  referred to the emergency department to initiate anticoagulation.  In the emergency department, HR 103, BP 114/73, R 16, 79% on RA. Hemoglobin was 11.6, platelets 131, proBNP was 2537, markedly elevated from prior. Head CT demonstrated no evidence of malignancy. He was given a heparin bolus and started on continuous infusion. He is being admitted for initiation of anticoagulatio and to determine what his home oxygen needs will be.    Hospital Course:   Acute hypoxic respiratory failure secondary to submassive pulmonary embolism, moderate clot burden with elevated BNP.  BP remained stable, however, ECHO demonstrated hypokinesis of the RV with "diastolic collapse of the right atrium" suggestive of right heart strain likely due to acute PE.  He was initially started on heparin infusion, however he was quickly converted to low molecular weight heparin as this is the standard of care in patients with DVT or PE secondary to malignancy. The case manager verified the patient will be able to afford his medications. The nursing staff educated the patient about injection technique, and he should start once daily Lovenox injections indefinitely.  Patient should use 2 L with exertion to maintain oxygen saturations greater than 88%.  Elevated proBNP suggestive of heart strain or heart failure.  This is likely elevated secondary to right heart strain from acute PE. His ejection fraction was 55-60% with normal wall motion and he had grade 1 diastolic heart failure.  Pericardial effusion, trivial and posterior to the heart without evidence of tamponade.  SCLC, per Oncology. Case discussed with Dr. Julien Nordmann. The patient may need some restaging CTs in the near future. There was  evidence of cancer progression on CAT scan of the chest.  CAD, stable. Continue BB, imdur, statin. Hold asa/plavix due to recent procedure and starting a/c.    A-fib with RVR during previous admission.  He should continue beta blocker. He  previously been crushing his sustained release diltiazem, however this medication should not be crushed. He was started on [place] Lust acting diltiazem 30 mg every 6 hours and his heart rate trended down to the 80s, blood pressure remained stable.   COPD without exacerbation and with some wheezing. Started duonebs.   T2DM, a1c 5.3 in 04/2013.  Recommended stopping oral medications due to risk of hypoglycemia  CKD stage 3, stable. 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  Continue magic mouthwash with lidocaine and soft diet.     Normocytic anemia and mild thrombocytopenia, likely related to chronic disease.  Hemoglobin and platelets approximately stable.  Repeat CBC in 1 week.    Severe protein calorie malnutrition. Continue liberalized diet with supplements   Procedures:  CT angio chest  ECHO  Consultations:  Oncology, Dr. Asencion Noble with Dr. Marlou Porch, Cardiology, regarding ECHO findings  Discharge Exam: Filed Vitals:   08/04/13 0440  BP: 111/82  Pulse: 87  Temp: 97.6 F (36.4 C)  Resp: 18   Filed Vitals:   08/03/13 1759 08/03/13 2103 08/04/13 0440 08/04/13 0744  BP:  114/75 111/82   Pulse:  83 87   Temp:  97.9 F (36.6 C) 97.6 F (36.4 C)   TempSrc:  Oral Oral   Resp:  20 18   Height:      Weight:      SpO2: 97% 99% 100% 99%    General: CM, NAD Eyes: PERRL, anicteric, non-injected.  ENT: Nares clear. mild tonsillar and posterior OP erythema without plaque, ulceration, or exudate. MMM.  Neck: Supple without TM or JVD.  Lymph: No cervical, supraclavicular, or submandibular LAD.  Cardiovascular: RRR, normal S1, S2, without m/r/g. 2+ pulses, warm extremities  Respiratory:  CTAB, no increased WOB.  Abdomen: NABS. Soft, ND/NT.  Skin: No rashes or focal lesions.  Musculoskeletal: Normal bulk and tone. No LE edema.  Psychiatric: A & O x 4. Appropriate affect.   Neurologic: CN 3-12 intact. 5/5 strength. Sensation intact.   Discharge Instructions      Discharge Orders   Future Orders Complete By Expires   Call MD for:  difficulty breathing, headache or visual disturbances  As directed    Call MD for:  extreme fatigue  As directed    Call MD for:  hives  As directed    Call MD for:  persistant dizziness or light-headedness  As directed    Call MD for:  persistant nausea and vomiting  As directed    Call MD for:  severe uncontrolled pain  As directed    Call MD for:  temperature >100.4  As directed    Discharge instructions  As directed    Comments:     You were hospitalized with a pulmonary embolism, or blood clots in your lungs.  This was probably caused by your cancer.  Please take lovenox injections once a day every day to prevent more blood clots from forming.  Your dose is due at [facility] every day, including today.  When you are exerting yourself, please use at least 2L of oxygen.  Dr. Julien Nordmann will check your blood counts at your follow up appointment in about a week.  Your ultrasound of your heart showed that you do not have severe strain on your heart from your blood clot.  Please let Dr. Julien Nordmann know if you develop worsening shortness of breath or swelling of your ankles.  Finally, please use dietary supplements like ensure, boost, etc, at least three times a day to increase your protein and calories.  You no longer have diabetes, so you can stop your glipizide and you do not need to check your blood sugars.   Increase activity slowly  As directed        Medication List    STOP taking these medications       diltiazem 240 MG 24 hr capsule  Commonly known as:  DILACOR XR  Replaced by:  diltiazem 30 MG tablet     glipiZIDE 10 MG tablet  Commonly known as:  GLUCOTROL     oxyCODONE 5 MG immediate release tablet  Commonly known as:  Oxy IR/ROXICODONE      TAKE these medications       albuterol (2.5 MG/3ML) 0.083% nebulizer solution   Commonly known as:  PROVENTIL  Take 2.5 mg by nebulization every 6 (six) hours as needed for wheezing or shortness of breath.     albuterol 108 (90 BASE) MCG/ACT inhaler  Commonly known as:  PROVENTIL HFA;VENTOLIN HFA  Inhale into the lungs every 6 (six) hours as needed for wheezing or shortness of breath.     atorvastatin 40 MG tablet  Commonly known as:  LIPITOR  Take 40 mg by mouth daily at 6 PM.     diltiazem 30 MG tablet  Commonly known as:  CARDIZEM  Take 1 tablet (30 mg total) by mouth 3 (three) times daily.     enoxaparin 150 MG/ML injection  Commonly known as:  LOVENOX  Inject 0.93 mLs (140 mg total) into the skin daily.     HYDROcodone-acetaminophen 7.5-325 mg/15 ml solution  Commonly known as:  HYCET  Take 10-20 mLs by mouth 4 (four) times daily as needed for moderate pain or severe pain.     isosorbide mononitrate 60 MG 24 hr tablet  Commonly known as:  IMDUR  Take 60 mg by mouth every morning.     magic mouthwash w/lidocaine Soln  Take 5 mLs by mouth 4 (four) times daily as needed for mouth pain.     metoprolol tartrate 25 MG tablet  Commonly known as:  LOPRESSOR  Take 25 mg by mouth 2 (two) times daily.     nitroGLYCERIN 0.4 MG SL tablet  Commonly known as:  NITROSTAT  Place 0.4 mg under the tongue every 5 (five) minutes as needed for chest pain. May repeat up to 3 doses     pantoprazole 40 MG tablet  Commonly known as:  PROTONIX  Take 40 mg by mouth 2 (two) times daily.       Follow-up Information   Follow up with [name], MD. Schedule an appointment as soon as possible for a visit in 2 weeks. (As needed)    Specialty:  Family Medicine   Contact information:   [address] [place] [address] [phone]       Follow up with Eilleen Kempf., MD In 1 week.   Specialty:  Oncology   Contact information:   [address] [place] [address] [phone]        The results of significant diagnostics  from this hospitalization (including imaging, microbiology, ancillary and laboratory) are listed below for  reference.    Significant Diagnostic Studies: Ct Head W Wo Contrast  08/03/2013   CLINICAL DATA:  58 year old male with small cell carcinoma of the lung. Restaging. Subsequent encounter.  EXAM: CT HEAD WITHOUT AND WITH CONTRAST  TECHNIQUE: Contiguous axial images were obtained from the base of the skull through the vertex without and with intravenous contrast  CONTRAST:  191mL OMNIPAQUE IOHEXOL 300 MG/ML SOLN in conjunction with contrast enhanced imaging of the chest reported separately.  COMPARISON:  Brain MRI 03/14/2013.  FINDINGS: Visible paranasal sinuses and mastoids are stable and clear. Negative visualized osseous structures. Negative scalp and orbits soft tissues.  Stable cerebral volume. No midline shift, ventriculomegaly, mass effect, evidence of mass lesion, intracranial hemorrhage or evidence of cortically based acute infarction. Gray-white matter differentiation is within normal limits throughout the brain. No abnormal enhancement identified. Major intracranial vascular structures are enhancing.  IMPRESSION: 1. Stable and normal CT appearance of the brain. 2. Chest CT from today reported separately.   Electronically Signed   By: Lars Pinks M.D.   On: 08/03/2013 13:47   Ct Chest W Contrast  08/03/2013   CLINICAL DATA:  Small cell lung cancer diagnosed 02/2013, chemotherapy and XRT complete, shortness of breath, dysphagia, weight loss.  EXAM: CT CHEST WITH CONTRAST  TECHNIQUE: Multidetector CT imaging of the chest was performed during intravenous contrast administration.  CONTRAST:  151mL OMNIPAQUE IOHEXOL 300 MG/ML  SOLN  COMPARISON:  CT chest dated 05/02/2013  FINDINGS: Although study was not tailored for optimal evaluation of the pulmonary arteries, there are bilateral pulmonary emboli involving the right middle lobe and bilateral lower lobe pulmonary arteries, as well as the lingular  branch. Overall clot burden is moderate. No findings to suggest right heart strain, with an RV-to-LV ratio of 0.77.  Postsurgical changes related to prior right lung wedge resection. Radiation changes in the superior segment left lower lobe.  New nodularity in the lingula measuring up to 1.7 x 1.5 cm (series 11/ image 21). Additional 11 mm irregular nodular opacity in the left lower lobe (series 11/ image 49), new).  Moderate to severe centrilobular and paraseptal emphysematous changes. Mild scarring in the anterolateral right lung base (series 11/ image 40), unchanged. No pleural effusion or pneumothorax.  Visualized thyroid is unremarkable.  Heart is normal in size. Small pericardial effusion, new/increased. Atherosclerotic calcifications of the aortic arch. Coronary atherosclerosis.  1.4 cm [name] axis right paratracheal node (series 5/ image 10), previously 1.2 cm.  Wall thickening/edema involving the mid esophagus (series 5/image 27), unchanged.  Visualized upper abdomen is unremarkable.  Degenerative changes of the visualized thoracolumbar spine. Multiple right rib defects related to prior right thoracotomy.  IMPRESSION: Bilateral pulmonary emboli, as described above. Overall clot burden is moderate.  Prior right lung wedge resection with radiation changes in the superior segment left lower lobe.  New nodularity in the lingula and left lower lobe, measuring up to 1.7 cm. While nonspecific, possibly post infectious/inflammatory and/or related to radiation changes, metastatic disease is not excluded. Attention on follow-up is suggested.  Mildly progressive right paratracheal lymphadenopathy measuring up to 1.4 cm, worrisome for nodal recurrence.  Critical value/emergent results were called by telephone at the time of interpretation on 08/03/2013 at 10:50 AM to Dr. Curt Bears , who verbally acknowledged these results. At his request, the patient was notified of the results and sent to the ED.   Electronically  Signed   By: Julian Hy M.D.   On: 08/03/2013 11:07   Dg Esophagus Dilatation  07/26/2013   CLINICAL DATA: esophageal stricture   ESOPHAGEAL DILATATION  Fluoroscopy was provided for use by the requesting physician.  No images  were obtained for radiographic interpretation.    Dg Esophagus Dilatation  07/14/2013   CLINICAL DATA: esophageal stricture   ESOPHAGEAL DILATATION  Fluoroscopy was provided for use by the requesting physician.  No images  were obtained for radiographic interpretation.    Dg Esophagus Dilatation  07/06/2013   CLINICAL DATA: esoph. dilation   ESOPHAGEAL DILATATION  Fluoroscopy was provided for use by the requesting physician.  No images  were obtained for radiographic interpretation.     Microbiology: No results found for this or any previous visit (from the past 240 hour(s)).   Labs: Basic Metabolic Panel:  Recent Labs Lab 08/03/13 1130  NA 137  K 4.1  CL 99  CO2 25  GLUCOSE 125*  BUN 12  CREATININE 1.31  CALCIUM 9.4   Liver Function Tests: No results found for this basename: AST, ALT, ALKPHOS, BILITOT, PROT, ALBUMIN,  in the last 168 hours No results found for this basename: LIPASE, AMYLASE,  in the last 168 hours No results found for this basename: AMMONIA,  in the last 168 hours CBC:  Recent Labs Lab 08/03/13 1130 08/04/13 0407  WBC 5.8 4.1  HGB 11.6* 11.1*  HCT 35.2* 34.7*  MCV 95.7 96.4  PLT 131* 120*   Cardiac Enzymes: No results found for this basename: CKTOTAL, CKMB, CKMBINDEX, TROPONINI,  in the last 168 hours BNP: BNP (last 3 results)  Recent Labs  02/14/13 1953 04/28/13 1430 08/03/13 1130  PROBNP 290.3* 678.8* 2537.0*   CBG:  Recent Labs Lab 08/03/13 1654 08/03/13 2104 08/04/13 0727 08/04/13 1126  GLUCAP 101* 122* 106* 140*    Time coordinating discharge: 45 minutes  Signed:  Jamaurie Bernier  [facility] 08/04/2013, 1:17 PM

## 2013-08-04 NOTE — Progress Notes (Signed)
Utilization review completed.  

## 2013-08-04 NOTE — Progress Notes (Signed)
Received referral for [facility] Care Management from COPD [facility] program. Patient not eligible for [facility] Care Management at this time as [facility] is not contracted with [facility].  Marthenia Rolling, MSN- [facility] [phone]

## 2013-08-04 NOTE — Consult Note (Signed)
[place]  [phone]    HOSPITAL PROGRESS NOTE  DIAGNOSIS: Small cell carcinoma of lung  Primary site: Lung (Left)  Staging method: AJCC 7th Edition  Clinical: Stage IIIA (T2a, N2, M0)  Summary: Stage IIIA (T2a, N2, M0)  Malignant neoplasm of lower lobe of left lung  Primary site: Lung (Left)  Staging method: AJCC 7th Edition  Summary: Incomplete stage   PRIOR THERAPY: None theophylline patient received radiation under the care of Dr. Pablo Ledger, Todd 60s Gy to the left lung completed on 04/28/13  CURRENT THERAPY: Systemic chemotherapy with carboplatin for an AUC of 5 given on day 1 and etoposide at 120 mg per meter squared given on days 1, 2 and 3 with Neulasta support given on day 4 given every 3 weeks. Status post 4 cycles.    History of present illness: Mr. [name]  Is a 58 year old man with multiple medical issues including lung cancer, CKD, CHF, A- fibrillation, COPD and a history of stroke among other problems, who was referred to the ED on  08/03/2013 after a scheduled CT of the chest with contrast on 08/03/2013 demonstrated bilateral pulmonary emboli with moderate overall clot burden. New  nodularity in the lingula measuring up to 1.7 x 1.5 cm and left lower lobe measuring 1.1 cm, irregular, was noted.also seen, increase in the size of the right tracheal node from 1.2 cm to 1.4 cm today. The patient had been experiencing  increasing shortness of breath with minimal  Ambulation and exertion despite the use of oxygen at home. 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  Marland Kitchen  He was placed on anticoagulation per pharmacy with Lovenox 90 mg subcutaneous twice a day.D. Echo is pending.He  was admitted to the hospitalist service for management of his symptoms, in the setting of acute hypoxic respiratory failure secondary to pulmonary embolism.  Based on  these findings, we have been asked to see the patient in consultation while hospitalized, with recommendations regarding his care.    MEDICATIONS:  Scheduled Meds: . atorvastatin  40 mg Oral q1800  . diltiazem  30 mg Oral 4 times per day  . enoxaparin (LOVENOX) injection  90 mg Subcutaneous Q12H  . insulin aspart  0-9 Units Subcutaneous TID WC  . ipratropium-albuterol  3 mL Nebulization TID  . isosorbide mononitrate  60 mg Oral q morning - 10a  . metoprolol tartrate  25 mg Oral BID  . pantoprazole  40 mg Oral BID  . sodium chloride  3 mL Intravenous Q12H   Continuous Infusions:  PRN Meds:.bisacodyl, HYDROcodone-acetaminophen, ipratropium-albuterol, magic mouthwash w/lidocaine, nitroGLYCERIN, polyethylene glycol ALLERGIES:   Allergies  Allergen Reactions  . Erythromycin Other (See Comments)    Reaction while in hospital - pt doesn't know reaction  . Fentanyl Other (See Comments)    Delirium and violent  . Penicillins Other (See Comments)    Childhood reaction - unknown     PHYSICAL EXAMINATION:   Filed Vitals:   08/04/13 0440  BP: 111/82  Pulse: 87  Temp: 97.6 F (36.4 C)  Resp: 18     Filed Weights   08/03/13 1307  Weight: 198 lb (89.68 kg)    58 year old  White  in no acute distress, alert and oriented x3  General well-developed and ill appearing HEENT: Normocephalic, atraumatic, PERRLA. Oral cavity without thrush or lesions. Neck supple. no thyromegaly, no cervical or supraclavicular adenopathy  Lungs  Mild  expiratory wheezing,  No rhonchi or rales. Cardiac: regular rate and rhythm,no murmur , rubs or gallops Abdomen soft nontender , bowel sounds x4. No hepatosplenomegaly Extremities no clubbing cyanosis or edema. No  petechial rash Neuro: non focal  LABORATORY/RADIOLOGY DATA:   Recent Labs Lab 08/31/13 1130 08/04/13 0407  WBC 5.8 4.1  HGB 11.6* 11.1*  HCT 35.2* 34.7*  PLT 131* 120*  MCV 95.7 96.4  MCH 31.5 30.8  MCHC 33.0 32.0  RDW 15.2 15.3      CMP    Recent Labs Lab Aug 31, 2013 1130  NA 137  K 4.1  CL 99  CO2 25  GLUCOSE 125*  BUN 12  CREATININE 1.31  CALCIUM 9.4        Component Value Date/Time   BILITOT 0.52 06/03/2013 0901   BILITOT 0.6 05/05/2013 0405   BILIDIR 0.1 12/10/2010 0843   IBILI 0.3 12/10/2010 0843      Recent Labs Lab 31-Aug-2013 1130  INR 1.10      Urinalysis    Component Value Date/Time   COLORURINE YELLOW 04/30/2013 1421   APPEARANCEUR CLEAR 04/30/2013 1421   LABSPEC 1.023 04/30/2013 1421   PHURINE 6.0 04/30/2013 1421   GLUCOSEU 100* 04/30/2013 1421   HGBUR NEGATIVE 04/30/2013 1421   BILIRUBINUR NEGATIVE 04/30/2013 1421   KETONESUR NEGATIVE 04/30/2013 1421   PROTEINUR 30* 04/30/2013 1421   UROBILINOGEN 0.2 04/30/2013 1421   NITRITE NEGATIVE 04/30/2013 1421   LEUKOCYTESUR NEGATIVE 04/30/2013 1421     CBG:  Recent Labs Lab 08/31/13 1654 08-31-2013 2104 08/04/13 0727  GLUCAP 101* 122* 106*   Hgb A1c  Recent Labs  08/31/13 1130  HGBA1C 5.3    Radiology Studies:  Ct Head W Wo Contrast  08/31/2013   CLINICAL DATA:  58 year old male with small cell carcinoma of the lung. Restaging. Subsequent encounter.  EXAM: CT HEAD WITHOUT AND WITH CONTRAST  TECHNIQUE: Contiguous axial images were obtained from the base of the skull through the vertex without and with intravenous contrast  CONTRAST:  17mL OMNIPAQUE IOHEXOL 300 MG/ML SOLN in conjunction with contrast enhanced imaging of the chest reported separately.  COMPARISON:  Brain MRI 03/14/2013.  FINDINGS: Visible paranasal sinuses and mastoids are stable and clear. Negative visualized osseous structures. Negative scalp and orbits soft tissues.  Stable cerebral volume. No midline shift, ventriculomegaly, mass effect, evidence of mass lesion, intracranial hemorrhage or evidence of cortically based acute infarction. Gray-white matter differentiation is within normal limits throughout the brain. No abnormal enhancement identified. Major  intracranial vascular structures are enhancing.  IMPRESSION: 1. Stable and normal CT appearance of the brain. 2. Chest CT from today reported separately.   Electronically Signed   By: Lars Pinks M.D.   On: 2013-08-31 13:47   Ct Chest W Contrast  08-31-13   CLINICAL DATA:  Small cell lung cancer diagnosed 02/2013, chemotherapy and XRT complete, shortness of breath, dysphagia, weight loss.  EXAM: CT CHEST WITH CONTRAST  TECHNIQUE: Multidetector CT imaging of the chest was performed during intravenous contrast administration.  CONTRAST:  128mL OMNIPAQUE IOHEXOL 300 MG/ML  SOLN  COMPARISON:  CT chest dated 05/02/2013  FINDINGS: Although study was not tailored for optimal evaluation of the pulmonary arteries, there are bilateral pulmonary emboli involving the right middle lobe and bilateral lower lobe pulmonary arteries, as well as the lingular branch. Overall clot burden is moderate. No findings to suggest right heart strain, with an RV-to-LV ratio of 0.77.  Postsurgical changes related to prior right lung wedge resection. Radiation  changes in the superior segment left lower lobe.  New nodularity in the lingula measuring up to 1.7 x 1.5 cm (series 11/ image 21). Additional 11 mm irregular nodular opacity in the left lower lobe (series 11/ image 49), new).  Moderate to severe centrilobular and paraseptal emphysematous changes. Mild scarring in the anterolateral right lung base (series 11/ image 40), unchanged. No pleural effusion or pneumothorax.  Visualized thyroid is unremarkable.  Heart is normal in size. Small pericardial effusion, new/increased. Atherosclerotic calcifications of the aortic arch. Coronary atherosclerosis.  1.4 cm short axis right paratracheal node (series 5/ image 10), previously 1.2 cm.  Wall thickening/edema involving the mid esophagus (series 5/image 27), unchanged.  Visualized upper abdomen is unremarkable.  Degenerative changes of the visualized thoracolumbar spine. Multiple right rib  defects related to prior right thoracotomy.  IMPRESSION: Bilateral pulmonary emboli, as described above. Overall clot burden is moderate.  Prior right lung wedge resection with radiation changes in the superior segment left lower lobe.  New nodularity in the lingula and left lower lobe, measuring up to 1.7 cm. While nonspecific, possibly post infectious/inflammatory and/or related to radiation changes, metastatic disease is not excluded. Attention on follow-up is suggested.  Mildly progressive right paratracheal lymphadenopathy measuring up to 1.4 cm, worrisome for nodal recurrence.  Critical value/emergent results were called by telephone at the time of interpretation on 08/03/2013 at 10:50 AM to Dr. Curt Bears , who verbally acknowledged these results. At his request, the patient was notified of the results and sent to the ED.   Electronically Signed   By: Julian Hy M.D.   On: 08/03/2013 11:07   Dg Esophagus Dilatation  07/26/2013   CLINICAL DATA: esophageal stricture   ESOPHAGEAL DILATATION  Fluoroscopy was provided for use by the requesting physician.  No images  were obtained for radiographic interpretation.       ASSESSMENT AND PLAN:   64. 58 years old white male with limited stage small cell lung cancer currently undergoing systemic chemotherapy concurrent with radiation, status post 4 cycles.Cycle 4 was given on 05/17/2013 with Neulasta on day 2 . Current CT of the chest on 08/03/2013 demonstrates progression of the disease. Plans as per Dr. Julien Nordmann.he may need completion of staging CTs while hospitalized.  2. Bilateral pulmonary embolism, appreciate pharmacy followup, currently on Lovenox 90 mg subcutaneous every 12 hours.  #3. COPD, on nebulizers, oxygen support  4. Anemia of chronic disease, stable. No transfusion is necessary at this time.  5. Thrombocytopenia, current platelet count is 120,000, continue to monitor as the patient is on anti-coagulation. No acute bleeding  issues.No transfusion is necessary at this time.  6. History of esophageal stricture, with latest dilatation and 07/26/2013.   7. Poor nutritional status, secondary to #6 and malignancy. The patient following as an outpatient. He is in soft diet.  8.Full  Code  Or other medical issues as per admitting team.  Rondel Jumbo, PA-C 08/04/2013, 9:52 AM

## 2013-08-05 ENCOUNTER — Ambulatory Visit: Payer: BC Managed Care – PPO | Admitting: Radiation Oncology

## 2013-08-05 NOTE — Progress Notes (Signed)
Pt refused offer of copd gold card.

## 2013-08-08 ENCOUNTER — Encounter (HOSPITAL_COMMUNITY): Payer: Self-pay | Admitting: Pharmacy Technician

## 2013-08-08 NOTE — ED Provider Notes (Signed)
Medical screening examination/treatment/procedure(s) were conducted as a shared visit with non-physician practitioner(s) and myself.  I personally evaluated the patient during the encounter.   EKG Interpretation None       Sent to ED for PE found on CT scan of chest - done for lung cancer screening. Hypoxic on room air. Heparin began. Admitted.  Osvaldo Shipper, MD 08/08/13 [phone]

## 2013-08-09 ENCOUNTER — Other Ambulatory Visit: Payer: Self-pay | Admitting: Radiation Oncology

## 2013-08-10 ENCOUNTER — Encounter (HOSPITAL_COMMUNITY): Payer: Self-pay | Admitting: *Deleted

## 2013-08-12 ENCOUNTER — Encounter: Payer: Self-pay | Admitting: Internal Medicine

## 2013-08-12 ENCOUNTER — Ambulatory Visit (HOSPITAL_BASED_OUTPATIENT_CLINIC_OR_DEPARTMENT_OTHER): Payer: BC Managed Care – PPO | Admitting: Internal Medicine

## 2013-08-12 VITALS — BP 112/78 | HR 105 | Temp 97.7°F | Resp 19 | Ht 70.0 in | Wt 190.3 lb

## 2013-08-12 DIAGNOSIS — C3430 Malignant neoplasm of lower lobe, unspecified bronchus or lung: Secondary | ICD-10-CM

## 2013-08-12 DIAGNOSIS — C3490 Malignant neoplasm of unspecified part of unspecified bronchus or lung: Secondary | ICD-10-CM

## 2013-08-12 NOTE — Progress Notes (Signed)
[place]  [phone] [phone] OFFICE VISIT PROGRESS NOTE  Kevin Reichert, MD [address] [place] [address]  DIAGNOSIS: Limited stage, Stage IIIA (T2a, N2, M0), Small cell carcinoma of lung, diagnosed in October of 2014, involving the left lower lobe and mediastinal lymphadenopathy.     PRIOR THERAPY: Systemic chemotherapy with carboplatin for an AUC of 5 given on day 1 and etoposide at 120 mg per meter squared given on days 1, 2 and 3 with Neulasta support given on day 4 given every 3 weeks. Status post 4 cycles.  CURRENT THERAPY: Observation.  DISEASE STAGE: Limited stage small cell lung cancer Small cell carcinoma of lung   Primary site: Lung (Left)   Staging method: AJCC 7th Edition   Clinical: Stage IIIA (T2a, N2, M0)   Summary: Stage IIIA (T2a, N2, M0) Malignant neoplasm of lower lobe of left lung   Primary site: Lung (Left)   Staging method: AJCC 7th Edition   Summary: Incomplete stage  CHEMOTHERAPY INTENT: Palliative  CURRENT # OF CHEMOTHERAPY CYCLES: 4  CURRENT ANTIEMETICS: Zofran, dexamethasone and Compazine  CURRENT SMOKING STATUS: Former smoker, quit 09/09/2009  ORAL CHEMOTHERAPY AND CONSENT: N./A  CURRENT BISPHOSPHONATES USE: None  PAIN MANAGEMENT: None  NARCOTICS INDUCED CONSTIPATION: None  LIVING WILL AND CODE STATUS: ?   INTERVAL HISTORY: [name] 58 y.o. male returns for followup visit accompanied by his sister. The patient is feeling fine today with no specific complaints except for mild odynophagia secondary to radiation treatment but it is improved compared to few months ago. He is currently on observation. He had repeat CT scan of the chest for restaging of his disease performed recently and it showed moderate burden of pulmonary embolism. The patient was admitted to [facility] and currently on Lovenox 1.5 mg/kg on daily basis. He feels much better today and there is  improvement in the shortness of breath. He denied having any fever or chills. He denied having any nausea or vomiting. The patient has no chest pain, shortness of breath, cough or hemoptysis. He is here today for evaluation and discussion of his treatment options.  MEDICAL HISTORY: Past Medical History  Diagnosis Date  . CAD (coronary artery disease)     a. PCI 3/08 to OM2 and mid CFX; b.  NSTEMI 6/10:  Prior stents patent, 90% dCFX,  mRCA occl (old) with collats; EF 55% on LV-gram => Xience DES 2.5 x 23 mm to distal CFX;  c. abnl MV 12/13 => LHC 04/23/12:  pLAD 50%, oOM1 80%, long stented segment of the CFX extending into the PLOM with the PLOM totally occluded ostially, CFX 70% ISR, mCFX 80% ISR, pRCA chronically occluded. Med Rx planned  . Diabetes mellitus type II   . Hyperlipidemia   . COPD (chronic obstructive pulmonary disease)     Quit tobacco 09/2009; Gold Stage 2 with asthma component - FEV1 1.53 L/54%, 14% fev1 BD response, DLCO 54% - July 2011; MM genotype 01/07/10; unable to afford spiriva and unwilling to try ics due to prior renal failure: state to Dr. [name], Aug 2011; started on atrovent HFA fall 2011. no desturation on walk test Dec 2011  . CKD (chronic kidney disease)   . Obesity   . History of CVA (cerebrovascular accident) 2007    Without residual deficits  . 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 no further rx  . CHF (congestive heart failure)   . Hypertension   . Asthma     as child  . Cancer     lung cancer-02/14/2013  . Blood transfusion without reported diagnosis 2015  . Radiation 03/16/13-04/28/13    Left lung 60Gy  . Pneumonia     02/14/2013 in hospital  . History  of chemotherapy finished dec 2014  . Complication of anesthesia 05-04-14 woke up during esophagus stretching  . History of home oxygen therapy     uses oxygen 2 liters per [place] prn  . Myocardial infarction     x3  . Stroke     7 years- no problems   . Shortness of breath     constant/wears 2 liters prn  . Pulmonary embolism 08-03-2013    bilateral    ALLERGIES:  is allergic to azithromycin; erythromycin; fentanyl; and penicillins.  MEDICATIONS:  Current Outpatient Prescriptions  Medication Sig Dispense Refill  . albuterol (PROVENTIL HFA;VENTOLIN HFA) 108 (90 BASE) MCG/ACT inhaler Inhale into the lungs every 6 (six) hours as needed for wheezing or shortness of breath.      Marland Kitchen albuterol (PROVENTIL) (2.5 MG/3ML) 0.083% nebulizer solution Take 2.5 mg by nebulization every 6 (six) hours as needed for wheezing or shortness of breath.      . Alum & Mag Hydroxide-Simeth (MAGIC MOUTHWASH W/LIDOCAINE) SOLN Take 5 mLs by mouth 4 (four) times daily as needed for mouth pain.      Marland Kitchen atorvastatin (LIPITOR) 40 MG tablet Take 40 mg by mouth daily.       Marland Kitchen diltiazem (CARDIZEM) 30 MG tablet Take 30 mg by mouth 3 (three) times daily.      Marland Kitchen enoxaparin (LOVENOX) 150 MG/ML injection Inject 0.93 mLs (140 mg total) into the skin daily.  32 Syringe  0  . isosorbide mononitrate (IMDUR) 60 MG 24 hr tablet Take 60 mg by mouth every morning.      . metoprolol tartrate (LOPRESSOR) 25 MG tablet Take 25 mg by mouth 2 (two) times daily.      . nitroGLYCERIN (NITROSTAT) 0.4 MG SL tablet Place 0.4 mg under the tongue every 5 (five) minutes as needed for chest pain. May repeat up to 3 doses      . nystatin (MYCOSTATIN) 100000 UNIT/ML suspension TAKE 5 MLS BY MOUTH 4 (FOUR) TIMES DAILY AS NEEDED FOR MOUTH PAIN.  480 mL  1   No current facility-administered medications for this visit.    SURGICAL HISTORY:  Past Surgical History  Procedure Laterality Date  . Wedge resection  03/28/2010  . Coronary stent placement  2009     x 2  . Cardiac catheterization  2009  . Endobronchial ultrasound Bilateral 02/28/2013    Procedure: ENDOBRONCHIAL ULTRASOUND;  Surgeon: Brand Males, MD;  Location: [facility] ENDOSCOPY;  Service: Cardiopulmonary;  Laterality: Bilateral;  . Esophagogastroduodenoscopy N/A 05/04/2013    Procedure: ESOPHAGOGASTRODUODENOSCOPY (EGD);  Surgeon: Inda Castle, MD;  Location: [facility];  Service: Endoscopy;  Laterality: N/A;  . Esophagogastroduodenoscopy N/A 07/06/2013    Procedure: ESOPHAGOGASTRODUODENOSCOPY (EGD);  Surgeon: Inda Castle, MD;  Location: [facility];  Service: Endoscopy;  Laterality: N/A;  . Savory dilation N/A 07/06/2013    Procedure: SAVORY DILATION;  Surgeon: Inda Castle, MD;  Location: [facility];  Service: Endoscopy;  Laterality: N/A;  . Esophagogastroduodenoscopy N/A 07/14/2013    Procedure: ESOPHAGOGASTRODUODENOSCOPY (EGD);  Surgeon: Inda Castle, MD;  Location: [facility];  Service: Endoscopy;  Laterality: N/A;  . Savory dilation N/A 07/14/2013    Procedure: SAVORY DILATION;  Surgeon: Inda Castle, MD;  Location: [facility];  Service: Endoscopy;  Laterality: N/A;  . Esophagogastroduodenoscopy N/A 07/26/2013    Procedure: ESOPHAGOGASTRODUODENOSCOPY (EGD);  Surgeon: Inda Castle, MD;  Location: [facility];  Service: Endoscopy;  Laterality: N/A;  . Savory dilation N/A 07/26/2013    Procedure: SAVORY DILATION;  Surgeon: Inda Castle, MD;  Location: [facility];  Service: Endoscopy;  Laterality: N/A;    REVIEW OF SYSTEMS:  Constitutional: positive for fatigue Eyes: negative Ears, nose, mouth, throat, and face: positive for sore throat and Occasional white drainage from his right tonsillar area not associated with fever chills or foul-smelling secretions, long-standing issue Respiratory: positive for dyspnea on exertion Cardiovascular: negative Gastrointestinal: negative Genitourinary:negative Integument/breast: negative Hematologic/lymphatic:  negative Musculoskeletal:negative Neurological: negative Behavioral/Psych: negative Endocrine: negative Allergic/Immunologic: negative   PHYSICAL EXAMINATION: General appearance: alert, cooperative, appears stated age and no distress Head: Normocephalic, without obvious abnormality, atraumatic Neck: no adenopathy, no carotid bruit, no JVD, supple, symmetrical, trachea midline and thyroid not enlarged, symmetric, no tenderness/mass/nodules Lymph nodes: Cervical, supraclavicular, and axillary nodes normal. Resp: clear to auscultation bilaterally Cardio: regular rate and rhythm, S1, S2 normal, no murmur, click, rub or gallop GI: soft, non-tender; bowel sounds normal; no masses,  no organomegaly Extremities: extremities normal, atraumatic, no cyanosis or edema Neurologic: Alert and oriented X 3, normal strength and tone. Normal symmetric reflexes. Normal coordination and gait  ECOG PERFORMANCE STATUS: 1 - Symptomatic but completely ambulatory  Blood pressure 112/78, pulse 105, temperature 97.7 F (36.5 C), temperature source Oral, resp. rate 19, height 5\' 10"  (1.778 m), weight 190 lb 4.8 oz (86.32 kg).  LABORATORY DATA: Lab Results  Component Value Date   WBC 4.1 08/04/2013   HGB 11.1* 08/04/2013   HCT 34.7* 08/04/2013   MCV 96.4 08/04/2013   PLT 120* 08/04/2013      Chemistry      Component Value Date/Time   NA 137 08/03/2013 1130   NA 135* 06/03/2013 0901   K 4.1 08/03/2013 1130   K 4.1 06/03/2013 0901   CL 99 08/03/2013 1130   CO2 25 08/03/2013 1130   CO2 23 06/03/2013 0901   BUN 12 08/03/2013 1130   BUN 21.8 06/03/2013 0901   CREATININE 1.31 08/03/2013 1130   CREATININE 1.5* 06/03/2013 0901   CREATININE 1.46* 12/10/2010 0843      Component Value Date/Time   CALCIUM 9.4 08/03/2013 1130   CALCIUM 9.7 06/03/2013 0901   ALKPHOS 138 06/03/2013 0901   ALKPHOS 102 05/05/2013 0405   AST 10 06/03/2013 0901   AST 17 05/05/2013 0405   ALT 11 06/03/2013 0901   ALT 14 05/05/2013 0405   BILITOT  0.52 06/03/2013 0901   BILITOT 0.6 05/05/2013 0405       RADIOGRAPHIC STUDIES: Ct Head W Wo Contrast  08/03/2013   CLINICAL DATA:  58 year old male with small cell carcinoma of the lung. Restaging. Subsequent encounter.  EXAM: CT HEAD WITHOUT AND WITH CONTRAST  TECHNIQUE: Contiguous axial images were obtained from the base of the skull through the vertex without and with intravenous contrast  CONTRAST:  159mL OMNIPAQUE IOHEXOL 300 MG/ML SOLN in conjunction with contrast enhanced imaging of the chest reported separately.  COMPARISON:  Brain MRI 03/14/2013.  FINDINGS: Visible paranasal  sinuses and mastoids are stable and clear. Negative visualized osseous structures. Negative scalp and orbits soft tissues.  Stable cerebral volume. No midline shift, ventriculomegaly, mass effect, evidence of mass lesion, intracranial hemorrhage or evidence of cortically based acute infarction. Gray-white matter differentiation is within normal limits throughout the brain. No abnormal enhancement identified. Major intracranial vascular structures are enhancing.  IMPRESSION: 1. Stable and normal CT appearance of the brain. 2. Chest CT from today reported separately.   Electronically Signed   By: Lars Pinks M.D.   On: 08/03/2013 13:47   Ct Chest W Contrast  08/03/2013   CLINICAL DATA:  Small cell lung cancer diagnosed 02/2013, chemotherapy and XRT complete, shortness of breath, dysphagia, weight loss.  EXAM: CT CHEST WITH CONTRAST  TECHNIQUE: Multidetector CT imaging of the chest was performed during intravenous contrast administration.  CONTRAST:  136mL OMNIPAQUE IOHEXOL 300 MG/ML  SOLN  COMPARISON:  CT chest dated 05/02/2013  FINDINGS: Although study was not tailored for optimal evaluation of the pulmonary arteries, there are bilateral pulmonary emboli involving the right middle lobe and bilateral lower lobe pulmonary arteries, as well as the lingular branch. Overall clot burden is moderate. No findings to suggest right heart  strain, with an RV-to-LV ratio of 0.77.  Postsurgical changes related to prior right lung wedge resection. Radiation changes in the superior segment left lower lobe.  New nodularity in the lingula measuring up to 1.7 x 1.5 cm (series 11/ image 21). Additional 11 mm irregular nodular opacity in the left lower lobe (series 11/ image 49), new).  Moderate to severe centrilobular and paraseptal emphysematous changes. Mild scarring in the anterolateral right lung base (series 11/ image 40), unchanged. No pleural effusion or pneumothorax.  Visualized thyroid is unremarkable.  Heart is normal in size. Small pericardial effusion, new/increased. Atherosclerotic calcifications of the aortic arch. Coronary atherosclerosis.  1.4 cm short axis right paratracheal node (series 5/ image 10), previously 1.2 cm.  Wall thickening/edema involving the mid esophagus (series 5/image 27), unchanged.  Visualized upper abdomen is unremarkable.  Degenerative changes of the visualized thoracolumbar spine. Multiple right rib defects related to prior right thoracotomy.  IMPRESSION: Bilateral pulmonary emboli, as described above. Overall clot burden is moderate.  Prior right lung wedge resection with radiation changes in the superior segment left lower lobe.  New nodularity in the lingula and left lower lobe, measuring up to 1.7 cm. While nonspecific, possibly post infectious/inflammatory and/or related to radiation changes, metastatic disease is not excluded. Attention on follow-up is suggested.  Mildly progressive right paratracheal lymphadenopathy measuring up to 1.4 cm, worrisome for nodal recurrence.  Critical value/emergent results were called by telephone at the time of interpretation on 08/03/2013 at 10:50 AM to Dr. Curt Bears , who verbally acknowledged these results. At his request, the patient was notified of the results and sent to the ED.   Electronically Signed   By: Julian Hy M.D.   On: 08/03/2013 11:07   Dg Esophagus  Dilatation  07/26/2013   CLINICAL DATA: esophageal stricture   ESOPHAGEAL DILATATION  Fluoroscopy was provided for use by the requesting physician.  No images  were obtained for radiographic interpretation.    Dg Esophagus Dilatation  07/14/2013   CLINICAL DATA: esophageal stricture   ESOPHAGEAL DILATATION  Fluoroscopy was provided for use by the requesting physician.  No images  were obtained for radiographic interpretation.    ASSESSMENT/PLAN: This is a very pleasant 58 years old white male with limited stage small cell lung cancer currently  undergoing systemic chemotherapy concurrent with radiation, status post 4 cycles. The patient is tolerating his chemotherapy fairly well with no significant adverse effects.  His last CT scan of the chest showed stable disease except for enlarging right paratracheal lymph node which measures 1.4 CM in size. I discussed the scan results with the patient and his sister. His head CT showed no evidence for metastatic disease in the brain. I recommended for the patient to see Dr. Pablo Ledger for consideration of prophylactic cranial irradiation in addition to possibility of radiotherapy to the enlarging right paratracheal lymph node, but this could be also followed closely by repeating CT scan of the chest in 2 months. I would see him back for follow up visit in 2 months for reevaluation with repeat CT scan of the chest. He was advised to call immediately if she has any concerning symptoms in the interval.  All questions were answered. The patient knows to call the clinic with any problems, questions or concerns. We can certainly see the patient much sooner if necessary.  I spent 15 minutes counseling the patient face to face. The total time spent in the appointment was 25 minutes.  Disclaimer: This note was dictated with voice recognition software. Similar sounding words can inadvertently be transcribed and may not be corrected upon review.   Eilleen Kempf.,  MD 08/12/2013

## 2013-08-15 ENCOUNTER — Telehealth: Payer: Self-pay | Admitting: Internal Medicine

## 2013-08-15 NOTE — Telephone Encounter (Signed)
, °

## 2013-08-18 ENCOUNTER — Ambulatory Visit
Admission: RE | Admit: 2013-08-18 | Discharge: 2013-08-18 | Disposition: A | Discharge status: Home / Self Care | Payer: BC Managed Care – PPO | Source: Ambulatory Visit | Attending: Radiation Oncology | Admitting: Radiation Oncology

## 2013-08-18 VITALS — BP 125/84 | HR 97 | Temp 97.6°F | Ht 70.0 in | Wt 193.1 lb

## 2013-08-18 DIAGNOSIS — C3490 Malignant neoplasm of unspecified part of unspecified bronchus or lung: Secondary | ICD-10-CM

## 2013-08-18 MED ORDER — HYDROCODONE-ACETAMINOPHEN 7.5-325 MG/15ML PO SOLN: 10.0000 mL | Freq: Four times a day (QID) | ORAL | Status: DC | PRN

## 2013-08-18 NOTE — Progress Notes (Signed)
Department of Radiation Oncology  Phone:  [phone] Fax:        [phone]   Name: [name] MRN: [id number]  DOB: [birth year]  Date: 08/18/2013  Follow Up Visit Note  Diagnosis: Limited stage small cell lung cancer  Summary and Interval since last radiation: 60 Gy completed 12/15  Interval History: [name] presents today for routine followup.  He actually walked in the clinic today. He is on a wheelchair. He is started Center out from the house. He has been to [facility] and [facility]. He states he still needs the hycet and Magic mouthwash ring the day. He is interested in joining the live strong per gram and increasing his stamina. His last hospital admission was for March 25 to March 26. He is able to swallow better but is still crushing most of his pills. His most recent CT of the chest was on 08/03/2013. This showed some nonspecific changes within the lung. It is also showed a mild increase in size and some right paratracheal adenopathy. In flipping through the CT scan in comparing this to his previous one in December I think this increase is minimal and can be changed based on the slice you are measuring. He denies any headaches or focal numbness or weakness. A CT of the head was negative on 325 as well. He is referred back to me to discuss prophylactic cranial radiation.  Allergies:  Allergies  Allergen Reactions  . Azithromycin     Reaction while in hospital - pt doesn't know reaction  . Erythromycin Other (See Comments)    Reaction while in hospital - pt doesn't know reaction  . Fentanyl Other (See Comments)    Delirium and violent  . Penicillins Other (See Comments)    Childhood reaction - unknown    Medications:  Current Outpatient Prescriptions  Medication Sig Dispense Refill  . albuterol (PROVENTIL HFA;VENTOLIN HFA) 108 (90 BASE) MCG/ACT inhaler Inhale into the lungs every 6 (six) hours as needed for wheezing or shortness of breath.      Marland Kitchen albuterol  (PROVENTIL) (2.5 MG/3ML) 0.083% nebulizer solution Take 2.5 mg by nebulization every 6 (six) hours as needed for wheezing or shortness of breath.      . Alum & Mag Hydroxide-Simeth (MAGIC MOUTHWASH W/LIDOCAINE) SOLN Take 5 mLs by mouth 4 (four) times daily as needed for mouth pain.      Marland Kitchen atorvastatin (LIPITOR) 40 MG tablet Take 40 mg by mouth daily.       Marland Kitchen enoxaparin (LOVENOX) 150 MG/ML injection Inject 0.93 mLs (140 mg total) into the skin daily.  32 Syringe  0  . HYDROcodone-acetaminophen (HYCET) 7.5-325 mg/15 ml solution Take 10 mLs by mouth 4 (four) times daily as needed for moderate pain.  500 mL  0  . isosorbide mononitrate (IMDUR) 60 MG 24 hr tablet Take 60 mg by mouth every morning.      . metoprolol tartrate (LOPRESSOR) 25 MG tablet Take 25 mg by mouth 2 (two) times daily.      . nitroGLYCERIN (NITROSTAT) 0.4 MG SL tablet Place 0.4 mg under the tongue every 5 (five) minutes as needed for chest pain. May repeat up to 3 doses      . pantoprazole (PROTONIX) 40 MG tablet Take 40 mg by mouth 2 (two) times daily.      Marland Kitchen nystatin (MYCOSTATIN) 100000 UNIT/ML suspension TAKE 5 MLS BY MOUTH 4 (FOUR) TIMES DAILY AS NEEDED FOR MOUTH PAIN.  480 mL  1  No current facility-administered medications for this encounter.    Physical Exam:  Filed Vitals:   08/18/13 1341  BP: 125/84  Pulse: 97  Temp: 97.6 F (36.4 C)  Height: 5\' 10"  (1.778 m)  Weight: 193 lb 1.6 oz (87.59 kg)  SpO2: 93%   is a male in no distress sitting comfortably on examining room table. 5 out of 5 strength bilaterally. And oriented x3.  IMPRESSION: [name] is a 58 y.o. male  Status post concurrent chemoradiation and now ready for prophylactic cranial irradiation to  PLAN:  I spoke with [name] in his brother today. We discussed the survival benefit seen in patients who undergo PCI in the setting of treated limited stage small cell lung cancer. We discussed the process of simulation the making a mask. We discussed 12 treatments  as an outpatient. We discussed the possibility of hair loss, skin changes, fatigue, and short-term memory loss. He would like to proceed forward at all possible. He has signed informed consent. We have scheduled for simulation next week. I have refilled his Hycet.    Thea Silversmith, MD

## 2013-08-18 NOTE — Progress Notes (Signed)
Patient has pain with swallowing in his esophagus.  He rates pain at 8/10 with swallowing.  He is currently taking hycet as needed and magic mouthwash.  He said magic mouthwash works immediately and the hycet helps for long term pain.  He reports that he needs a refill on the hycet.  He is eating soft foods.  He reports an occasional cough.  Denies hemoptysis and fatigue.  Has some shortness of breath.  Oxygen saturation today is 93% on room air.  Was hospitalized two weeks ago with blood clots in his lungs.

## 2013-08-20 ENCOUNTER — Emergency Department (HOSPITAL_COMMUNITY): Payer: BC Managed Care – PPO

## 2013-08-20 ENCOUNTER — Inpatient Hospital Stay (HOSPITAL_COMMUNITY)
Admission: EM | Admit: 2013-08-20 | Discharge: 2013-08-25 | DRG: 246 | Disposition: A | Discharge status: Home / Self Care | Payer: BC Managed Care – PPO | Attending: Internal Medicine | Admitting: Internal Medicine

## 2013-08-20 ENCOUNTER — Encounter (HOSPITAL_COMMUNITY): Payer: Self-pay | Admitting: Emergency Medicine

## 2013-08-20 DIAGNOSIS — Z8673 Personal history of transient ischemic attack (TIA), and cerebral infarction without residual deficits: Secondary | ICD-10-CM

## 2013-08-20 DIAGNOSIS — R222 Localized swelling, mass and lump, trunk: Secondary | ICD-10-CM

## 2013-08-20 DIAGNOSIS — I4891 Unspecified atrial fibrillation: Secondary | ICD-10-CM

## 2013-08-20 DIAGNOSIS — E119 Type 2 diabetes mellitus without complications: Secondary | ICD-10-CM

## 2013-08-20 DIAGNOSIS — K208 Other esophagitis without bleeding: Secondary | ICD-10-CM

## 2013-08-20 DIAGNOSIS — I319 Disease of pericardium, unspecified: Secondary | ICD-10-CM | POA: Diagnosis present

## 2013-08-20 DIAGNOSIS — E8881 Metabolic syndrome: Secondary | ICD-10-CM

## 2013-08-20 DIAGNOSIS — R0789 Other chest pain: Secondary | ICD-10-CM | POA: Diagnosis present

## 2013-08-20 DIAGNOSIS — I129 Hypertensive chronic kidney disease with stage 1 through stage 4 chronic kidney disease, or unspecified chronic kidney disease: Secondary | ICD-10-CM | POA: Diagnosis present

## 2013-08-20 DIAGNOSIS — R079 Chest pain, unspecified: Secondary | ICD-10-CM | POA: Diagnosis present

## 2013-08-20 DIAGNOSIS — I208 Other forms of angina pectoris: Secondary | ICD-10-CM

## 2013-08-20 DIAGNOSIS — I252 Old myocardial infarction: Secondary | ICD-10-CM

## 2013-08-20 DIAGNOSIS — I2582 Chronic total occlusion of coronary artery: Secondary | ICD-10-CM | POA: Diagnosis present

## 2013-08-20 DIAGNOSIS — C3490 Malignant neoplasm of unspecified part of unspecified bronchus or lung: Secondary | ICD-10-CM

## 2013-08-20 DIAGNOSIS — I214 Non-ST elevation (NSTEMI) myocardial infarction: Secondary | ICD-10-CM

## 2013-08-20 DIAGNOSIS — I5032 Chronic diastolic (congestive) heart failure: Secondary | ICD-10-CM

## 2013-08-20 DIAGNOSIS — I2510 Atherosclerotic heart disease of native coronary artery without angina pectoris: Secondary | ICD-10-CM

## 2013-08-20 DIAGNOSIS — I10 Essential (primary) hypertension: Secondary | ICD-10-CM

## 2013-08-20 DIAGNOSIS — J4489 Other specified chronic obstructive pulmonary disease: Secondary | ICD-10-CM | POA: Diagnosis present

## 2013-08-20 DIAGNOSIS — Z7901 Long term (current) use of anticoagulants: Secondary | ICD-10-CM

## 2013-08-20 DIAGNOSIS — R1310 Dysphagia, unspecified: Secondary | ICD-10-CM

## 2013-08-20 DIAGNOSIS — I5030 Unspecified diastolic (congestive) heart failure: Secondary | ICD-10-CM

## 2013-08-20 DIAGNOSIS — I2089 Other forms of angina pectoris: Secondary | ICD-10-CM

## 2013-08-20 DIAGNOSIS — R918 Other nonspecific abnormal finding of lung field: Secondary | ICD-10-CM

## 2013-08-20 DIAGNOSIS — I2699 Other pulmonary embolism without acute cor pulmonale: Secondary | ICD-10-CM

## 2013-08-20 DIAGNOSIS — N183 Chronic kidney disease, stage 3 unspecified: Secondary | ICD-10-CM | POA: Diagnosis present

## 2013-08-20 DIAGNOSIS — Z9221 Personal history of antineoplastic chemotherapy: Secondary | ICD-10-CM

## 2013-08-20 DIAGNOSIS — Z7982 Long term (current) use of aspirin: Secondary | ICD-10-CM

## 2013-08-20 DIAGNOSIS — I959 Hypotension, unspecified: Secondary | ICD-10-CM | POA: Diagnosis present

## 2013-08-20 DIAGNOSIS — T66XXXA Radiation sickness, unspecified, initial encounter: Secondary | ICD-10-CM | POA: Diagnosis present

## 2013-08-20 DIAGNOSIS — E785 Hyperlipidemia, unspecified: Secondary | ICD-10-CM

## 2013-08-20 DIAGNOSIS — I509 Heart failure, unspecified: Secondary | ICD-10-CM | POA: Diagnosis present

## 2013-08-20 DIAGNOSIS — Z79899 Other long term (current) drug therapy: Secondary | ICD-10-CM

## 2013-08-20 DIAGNOSIS — Y842 Radiological procedure and radiotherapy as the cause of abnormal reaction of the patient, or of later complication, without mention of misadventure at the time of the procedure: Secondary | ICD-10-CM | POA: Diagnosis present

## 2013-08-20 DIAGNOSIS — J449 Chronic obstructive pulmonary disease, unspecified: Secondary | ICD-10-CM

## 2013-08-20 DIAGNOSIS — J9621 Acute and chronic respiratory failure with hypoxia: Secondary | ICD-10-CM

## 2013-08-20 DIAGNOSIS — R0600 Dyspnea, unspecified: Secondary | ICD-10-CM

## 2013-08-20 DIAGNOSIS — Z87891 Personal history of nicotine dependence: Secondary | ICD-10-CM

## 2013-08-20 DIAGNOSIS — R5081 Fever presenting with conditions classified elsewhere: Secondary | ICD-10-CM

## 2013-08-20 DIAGNOSIS — D61810 Antineoplastic chemotherapy induced pancytopenia: Secondary | ICD-10-CM

## 2013-08-20 DIAGNOSIS — E43 Unspecified severe protein-calorie malnutrition: Secondary | ICD-10-CM

## 2013-08-20 DIAGNOSIS — Z9981 Dependence on supplemental oxygen: Secondary | ICD-10-CM

## 2013-08-20 DIAGNOSIS — Z6827 Body mass index (BMI) 27.0-27.9, adult: Secondary | ICD-10-CM

## 2013-08-20 DIAGNOSIS — D709 Neutropenia, unspecified: Secondary | ICD-10-CM

## 2013-08-20 DIAGNOSIS — K222 Esophageal obstruction: Secondary | ICD-10-CM

## 2013-08-20 DIAGNOSIS — J9620 Acute and chronic respiratory failure, unspecified whether with hypoxia or hypercapnia: Secondary | ICD-10-CM | POA: Diagnosis present

## 2013-08-20 DIAGNOSIS — Z9861 Coronary angioplasty status: Secondary | ICD-10-CM

## 2013-08-20 DIAGNOSIS — I313 Pericardial effusion (noninflammatory): Secondary | ICD-10-CM | POA: Diagnosis present

## 2013-08-20 DIAGNOSIS — J441 Chronic obstructive pulmonary disease with (acute) exacerbation: Secondary | ICD-10-CM

## 2013-08-20 DIAGNOSIS — I3139 Other pericardial effusion (noninflammatory): Secondary | ICD-10-CM

## 2013-08-20 DIAGNOSIS — Z7902 Long term (current) use of antithrombotics/antiplatelets: Secondary | ICD-10-CM

## 2013-08-20 LAB — CBC WITH DIFFERENTIAL/PLATELET
BASOS PCT: 0 % (ref 0–1)
Basophils Absolute: 0 10*3/uL (ref 0.0–0.1)
Eosinophils Absolute: 0.1 10*3/uL (ref 0.0–0.7)
Eosinophils Relative: 1 % (ref 0–5)
HEMATOCRIT: 41.1 % (ref 39.0–52.0)
HEMOGLOBIN: 13.8 g/dL (ref 13.0–17.0)
LYMPHS ABS: 0.7 10*3/uL (ref 0.7–4.0)
Lymphocytes Relative: 11 % — ABNORMAL LOW (ref 12–46)
MCH: 31.4 pg (ref 26.0–34.0)
MCHC: 33.6 g/dL (ref 30.0–36.0)
MCV: 93.6 fL (ref 78.0–100.0)
MONO ABS: 0.5 10*3/uL (ref 0.1–1.0)
MONOS PCT: 8 % (ref 3–12)
Neutro Abs: 5 10*3/uL (ref 1.7–7.7)
Neutrophils Relative %: 79 % — ABNORMAL HIGH (ref 43–77)
Platelets: 176 10*3/uL (ref 150–400)
RBC: 4.39 MIL/uL (ref 4.22–5.81)
RDW: 14.6 % (ref 11.5–15.5)
WBC: 6.3 10*3/uL (ref 4.0–10.5)

## 2013-08-20 LAB — BASIC METABOLIC PANEL
BUN: 20 mg/dL (ref 6–23)
CALCIUM: 9.7 mg/dL (ref 8.4–10.5)
CO2: 25 mEq/L (ref 19–32)
CREATININE: 1.31 mg/dL (ref 0.50–1.35)
Chloride: 96 mEq/L (ref 96–112)
GFR, EST AFRICAN AMERICAN: 68 mL/min — AB (ref >90)
GFR, EST NON AFRICAN AMERICAN: 59 mL/min — AB (ref >90)
Glucose, Bld: 230 mg/dL — ABNORMAL HIGH (ref 70–99)
Potassium: 4.3 mEq/L (ref 3.7–5.3)
Sodium: 136 mEq/L — ABNORMAL LOW (ref 137–147)

## 2013-08-20 LAB — PRO B NATRIURETIC PEPTIDE: Pro B Natriuretic peptide (BNP): 1986 pg/mL — ABNORMAL HIGH (ref 0–125)

## 2013-08-20 LAB — TROPONIN I
TROPONIN I: 0.45 ng/mL — AB (ref <0.30)
Troponin I: <0.3 ng/mL (ref <0.30)

## 2013-08-20 MED ORDER — ONDANSETRON HCL 4 MG/2ML IJ SOLN: 4.0000 mg | Freq: Three times a day (TID) | INTRAMUSCULAR | Status: DC | PRN

## 2013-08-20 MED ORDER — ASPIRIN 81 MG PO CHEW
324.0000 mg | CHEWABLE_TABLET | Freq: Once | ORAL | Status: AC
  Administered 2013-08-20: 324 mg via ORAL
  Filled 2013-08-20 (×2): qty 4

## 2013-08-20 MED ORDER — SODIUM CHLORIDE 0.9 % IV SOLN
INTRAVENOUS | Status: DC
  Administered 2013-08-20: 22:00:00 via INTRAVENOUS

## 2013-08-20 MED ORDER — HYDROMORPHONE HCL PF 1 MG/ML IJ SOLN
1.0000 mg | INTRAMUSCULAR | Status: DC | PRN
  Administered 2013-08-20 – 2013-08-22 (×7): 1 mg via INTRAVENOUS
  Filled 2013-08-20 (×7): qty 1

## 2013-08-20 MED ORDER — NITROGLYCERIN 0.4 MG SL SUBL
0.4000 mg | SUBLINGUAL_TABLET | SUBLINGUAL | Status: DC | PRN
  Administered 2013-08-20 (×2): 0.4 mg via SUBLINGUAL
  Filled 2013-08-20: qty 1

## 2013-08-20 MED ORDER — METOPROLOL TARTRATE 25 MG PO TABS
25.0000 mg | ORAL_TABLET | Freq: Two times a day (BID) | ORAL | Status: DC
  Administered 2013-08-20 – 2013-08-24 (×8): 25 mg via ORAL
  Filled 2013-08-20 (×11): qty 1

## 2013-08-20 MED ORDER — GI COCKTAIL ~~LOC~~
30.0000 mL | Freq: Three times a day (TID) | ORAL | Status: DC | PRN
  Administered 2013-08-20 – 2013-08-23 (×3): 30 mL via ORAL
  Filled 2013-08-20 (×3): qty 30

## 2013-08-20 MED ORDER — ATORVASTATIN CALCIUM 40 MG PO TABS
40.0000 mg | ORAL_TABLET | Freq: Every day | ORAL | Status: DC
  Administered 2013-08-21 – 2013-08-24 (×4): 40 mg via ORAL
  Filled 2013-08-20 (×6): qty 1

## 2013-08-20 MED ORDER — HYDROCODONE-ACETAMINOPHEN 5-325 MG PO TABS
1.0000 | ORAL_TABLET | ORAL | Status: DC | PRN
  Administered 2013-08-20 – 2013-08-21 (×2): 2 via ORAL
  Administered 2013-08-22: 1 via ORAL
  Filled 2013-08-20 (×4): qty 2
  Filled 2013-08-20: qty 1

## 2013-08-20 MED ORDER — HEPARIN (PORCINE) IN NACL 100-0.45 UNIT/ML-% IJ SOLN
1600.0000 [IU]/h | INTRAMUSCULAR | Status: DC
  Administered 2013-08-20: 1450 [IU]/h via INTRAVENOUS
  Administered 2013-08-21 – 2013-08-22 (×3): 1600 [IU]/h via INTRAVENOUS
  Filled 2013-08-20 (×7): qty 250

## 2013-08-20 MED ORDER — NITROGLYCERIN 0.4 MG SL SUBL: 0.4000 mg | SUBLINGUAL_TABLET | SUBLINGUAL | Status: DC | PRN

## 2013-08-20 MED ORDER — IPRATROPIUM-ALBUTEROL 0.5-2.5 (3) MG/3ML IN SOLN
3.0000 mL | Freq: Once | RESPIRATORY_TRACT | Status: AC
  Administered 2013-08-20: 3 mL via RESPIRATORY_TRACT
  Filled 2013-08-20: qty 3

## 2013-08-20 MED ORDER — ISOSORBIDE MONONITRATE ER 60 MG PO TB24
60.0000 mg | ORAL_TABLET | Freq: Every morning | ORAL | Status: DC
  Administered 2013-08-21 – 2013-08-25 (×5): 60 mg via ORAL
  Filled 2013-08-20 (×6): qty 1

## 2013-08-20 MED ORDER — PANTOPRAZOLE SODIUM 40 MG IV SOLR
40.0000 mg | Freq: Two times a day (BID) | INTRAVENOUS | Status: DC
  Administered 2013-08-20: 40 mg via INTRAVENOUS
  Filled 2013-08-20: qty 40

## 2013-08-20 MED ORDER — PANTOPRAZOLE SODIUM 40 MG IV SOLR
40.0000 mg | Freq: Two times a day (BID) | INTRAVENOUS | Status: DC
  Administered 2013-08-21 – 2013-08-22 (×2): 40 mg via INTRAVENOUS
  Filled 2013-08-20 (×5): qty 40

## 2013-08-20 MED ORDER — ONDANSETRON HCL 4 MG PO TABS: 4.0000 mg | ORAL_TABLET | Freq: Four times a day (QID) | ORAL | Status: DC | PRN

## 2013-08-20 MED ORDER — HYDROMORPHONE HCL PF 1 MG/ML IJ SOLN
1.0000 mg | INTRAMUSCULAR | Status: DC | PRN
  Administered 2013-08-20: 1 mg via INTRAVENOUS
  Filled 2013-08-20: qty 1

## 2013-08-20 MED ORDER — ONDANSETRON HCL 4 MG/2ML IJ SOLN
4.0000 mg | Freq: Four times a day (QID) | INTRAMUSCULAR | Status: DC | PRN
  Administered 2013-08-21 (×3): 4 mg via INTRAVENOUS
  Filled 2013-08-20 (×3): qty 2

## 2013-08-20 MED ORDER — IOHEXOL 350 MG/ML SOLN
100.0000 mL | Freq: Once | INTRAVENOUS | Status: AC | PRN
  Administered 2013-08-20: 100 mL via INTRAVENOUS

## 2013-08-20 MED ORDER — NITROGLYCERIN 2 % TD OINT
1.0000 [in_us] | TOPICAL_OINTMENT | Freq: Once | TRANSDERMAL | Status: AC
  Administered 2013-08-20: 1 [in_us] via TOPICAL
  Filled 2013-08-20: qty 30

## 2013-08-20 MED ORDER — SODIUM CHLORIDE 0.9 % IV BOLUS (SEPSIS)
500.0000 mL | Freq: Once | INTRAVENOUS | Status: AC
  Administered 2013-08-20: 500 mL via INTRAVENOUS

## 2013-08-20 MED ORDER — ALBUTEROL SULFATE (2.5 MG/3ML) 0.083% IN NEBU
2.5000 mg | INHALATION_SOLUTION | Freq: Four times a day (QID) | RESPIRATORY_TRACT | Status: DC | PRN
  Administered 2013-08-22 – 2013-08-24 (×3): 2.5 mg via RESPIRATORY_TRACT
  Filled 2013-08-20 (×3): qty 3

## 2013-08-20 MED ORDER — MORPHINE SULFATE 4 MG/ML IJ SOLN
4.0000 mg | INTRAMUSCULAR | Status: DC | PRN
  Administered 2013-08-20 – 2013-08-21 (×3): 4 mg via INTRAVENOUS
  Filled 2013-08-20 (×3): qty 1

## 2013-08-20 MED ORDER — ACETAMINOPHEN 650 MG RE SUPP: 650.0000 mg | Freq: Four times a day (QID) | RECTAL | Status: DC | PRN

## 2013-08-20 MED ORDER — ACETAMINOPHEN 325 MG PO TABS
650.0000 mg | ORAL_TABLET | Freq: Four times a day (QID) | ORAL | Status: DC | PRN
Start: 2013-08-20 — End: 2013-08-25

## 2013-08-20 MED ORDER — ALUM & MAG HYDROXIDE-SIMETH 200-200-20 MG/5ML PO SUSP: 30.0000 mL | Freq: Four times a day (QID) | ORAL | Status: DC | PRN

## 2013-08-20 MED ORDER — GI COCKTAIL ~~LOC~~
30.0000 mL | Freq: Once | ORAL | Status: AC
  Administered 2013-08-20: 30 mL via ORAL
  Filled 2013-08-20: qty 30

## 2013-08-20 MED ORDER — HEPARIN BOLUS VIA INFUSION
2400.0000 [IU] | Freq: Once | INTRAVENOUS | Status: AC
  Administered 2013-08-20: 2400 [IU] via INTRAVENOUS
  Filled 2013-08-20: qty 2400

## 2013-08-20 NOTE — ED Notes (Signed)
Pt from home reports sudden onset of "chest burning", chest pain and SOB. Pt has hx of lung CA, chemo and radiation. Pt last tx was December 2014. Pt denies N/V. Pt also has hx of MI.

## 2013-08-20 NOTE — ED Notes (Signed)
Consulted with hospitalist due to elevated troponin. Advised she would consult cardiology at this time. Made ER physician aware as well.

## 2013-08-20 NOTE — ED Notes (Signed)
Received report from previous shift.  Pt has elevated troponin, hospitalist was notified, cardiology was consulted, await orders and ?transfer to [facility], previous shift notified floor where pt has bed, await further orders prior to transfer to floor.

## 2013-08-20 NOTE — Progress Notes (Signed)
ANTICOAGULATION CONSULT NOTE - Initial Consult  Pharmacy Consult for Heparin Indication: pulmonary embolus  Allergies  Allergen Reactions  . Azithromycin     Reaction while in hospital - pt doesn't know reaction  . Erythromycin Other (See Comments)    Reaction while in hospital - pt doesn't know reaction  . Fentanyl Other (See Comments)    Delirium and violent  . Penicillins Other (See Comments)    Childhood reaction - unknown    Patient Measurements: Height: 5\' 10"  (177.8 cm) Weight: 190 lb (86.183 kg) IBW/kg (Calculated) : 73 Heparin Dosing Weight:   Vital Signs: Temp: 97.4 F (36.3 C) (04/11 1542) Temp src: Oral (04/11 1542) BP: 151/103 mmHg (04/11 1815) Pulse Rate: 104 (04/11 1815)  Labs:  Recent Labs  08/20/13 1545  HGB 13.8  HCT 41.1  PLT 176  CREATININE 1.31  TROPONINI <0.30    Estimated Creatinine Clearance: 64.2 ml/min (by C-G formula based on Cr of 1.31).   Medical History: Past Medical History  Diagnosis Date  . CAD (coronary artery disease)     a. PCI 3/08 to OM2 and mid CFX; b.  NSTEMI 6/10:  Prior stents patent, 90% dCFX,  mRCA occl (old) with collats; EF 55% on LV-gram => Xience DES 2.5 x 23 mm to distal CFX;  c. abnl MV 12/13 => LHC 04/23/12:  pLAD 50%, oOM1 80%, long stented segment of the CFX extending into the PLOM with the PLOM totally occluded ostially, CFX 70% ISR, mCFX 80% ISR, pRCA chronically occluded. Med Rx planned  . Diabetes mellitus type II   . Hyperlipidemia   . COPD (chronic obstructive pulmonary disease)     Quit tobacco 09/2009; Gold Stage 2 with asthma component - FEV1 1.53 L/54%, 14% fev1 BD response, DLCO 54% - July 2011; MM genotype 01/07/10; unable to afford spiriva and unwilling to try ics due to prior renal failure: state to Dr. [name], Aug 2011; started on atrovent HFA fall 2011. no desturation on walk test Dec 2011  . CKD (chronic kidney disease)   . Obesity   . History of CVA (cerebrovascular accident) 2007   Without residual deficits  . 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 no further rx  . CHF (congestive heart failure)   . Hypertension   . Asthma     as child  . Cancer     lung cancer-02/14/2013  . Blood transfusion without reported diagnosis 2015  . Radiation 03/16/13-04/28/13    Left lung 60Gy  . Pneumonia     02/14/2013 in hospital  . History of chemotherapy finished dec 2014  . Complication of anesthesia 05-04-14 woke up during esophagus stretching  . History of home oxygen therapy     uses oxygen 2 liters per [place] prn  . Myocardial infarction     x3  . Stroke     7 years- no problems   . Shortness of breath     constant/wears 2 liters prn  . Pulmonary embolism 08-03-2013    bilateral    Medications:  Infusions:  . heparin    . heparin      Assessment: Patient on lovenox for PE PTA.  Last dose >24hr ago with q24hr  dosing.    Goal of Therapy:  Heparin level 0.3-0.7 units/ml Monitor platelets by anticoagulation protocol: Yes   Plan:  Heparin bolus 2400 units iv x1 Heparin drip at 1450 units/hr Daily heparin level and CBC Next heparin level at  0200 4/12    [place]. 08/20/2013,6:51 PM

## 2013-08-20 NOTE — ED Provider Notes (Signed)
CSN: [id number]     Arrival date & time 08/20/13  1529 History   First MD Initiated Contact with Patient 08/20/13 1532     No chief complaint on file.    (Consider location/radiation/quality/duration/timing/severity/associated sxs/prior Treatment) HPI Comments: 58 yo male with DM, COPD, lipids, HTN, 3 cardiac stents, angina, smoking, lung CA, last radiation Dec, PE, home O2 prn presents with sob and cp for the past 2 hrs, fairly constant but worsens intermittently. Pain burning, no exertional sxs.   Pt unsure if similar to CAD vs PE hx however he has had it before. On lovenox daily, has not missed doses.  No recent exertional sxs.  Mild nausea.  No radiation however intermittent jaw pain.  Improves with time.    The history is provided by the patient.    Past Medical History  Diagnosis Date  . CAD (coronary artery disease)     a. PCI 3/08 to OM2 and mid CFX; b.  NSTEMI 6/10:  Prior stents patent, 90% dCFX,  mRCA occl (old) with collats; EF 55% on LV-gram => Xience DES 2.5 x 23 mm to distal CFX;  c. abnl MV 12/13 => LHC 04/23/12:  pLAD 50%, oOM1 80%, long stented segment of the CFX extending into the PLOM with the PLOM totally occluded ostially, CFX 70% ISR, mCFX 80% ISR, pRCA chronically occluded. Med Rx planned  . Diabetes mellitus type II   . Hyperlipidemia   . COPD (chronic obstructive pulmonary disease)     Quit tobacco 09/2009; Gold Stage 2 with asthma component - FEV1 1.53 L/54%, 14% fev1 BD response, DLCO 54% - July 2011; MM genotype 01/07/10; unable to afford spiriva and unwilling to try ics due to prior renal failure: state to Dr. [name], Aug 2011; started on atrovent HFA fall 2011. no desturation on walk test Dec 2011  . CKD (chronic kidney disease)   . Obesity   . History of CVA (cerebrovascular accident) 2007    Without residual deficits  . 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 no further rx  . CHF (congestive heart failure)   . Hypertension   . Asthma     as child  . Cancer     lung cancer-02/14/2013  . Blood transfusion without reported diagnosis 2015  . Radiation 03/16/13-04/28/13    Left lung 60Gy  . Pneumonia     02/14/2013 in hospital  . History of chemotherapy finished dec 2014  . Complication of anesthesia 05-04-14 woke up during esophagus stretching  . History of home oxygen therapy     uses oxygen 2 liters per [facility] prn  . Myocardial infarction     x3  . Stroke     7 years- no problems   . Shortness of breath     constant/wears 2 liters prn  . Pulmonary embolism 08-03-2013    bilateral   Past Surgical History  Procedure Laterality Date  . Wedge resection  03/28/2010  . Coronary stent placement  2009    x 2  . Cardiac catheterization  2009  . Endobronchial ultrasound Bilateral 02/28/2013    Procedure: ENDOBRONCHIAL ULTRASOUND;  Surgeon: Brand Males, MD;  Location: [facility];  Service: Cardiopulmonary;  Laterality: Bilateral;  .  Esophagogastroduodenoscopy N/A 05/04/2013    Procedure: ESOPHAGOGASTRODUODENOSCOPY (EGD);  Surgeon: Inda Castle, MD;  Location: [facility];  Service: Endoscopy;  Laterality: N/A;  . Esophagogastroduodenoscopy N/A 07/06/2013    Procedure: ESOPHAGOGASTRODUODENOSCOPY (EGD);  Surgeon: Inda Castle, MD;  Location: [facility];  Service: Endoscopy;  Laterality: N/A;  . Savory dilation N/A 07/06/2013    Procedure: SAVORY DILATION;  Surgeon: Inda Castle, MD;  Location: [facility];  Service: Endoscopy;  Laterality: N/A;  . Esophagogastroduodenoscopy N/A 07/14/2013    Procedure: ESOPHAGOGASTRODUODENOSCOPY (EGD);  Surgeon: Inda Castle, MD;  Location: [facility];  Service: Endoscopy;  Laterality: N/A;  .  Savory dilation N/A 07/14/2013    Procedure: SAVORY DILATION;  Surgeon: Inda Castle, MD;  Location: [facility];  Service: Endoscopy;  Laterality: N/A;  . Esophagogastroduodenoscopy N/A 07/26/2013    Procedure: ESOPHAGOGASTRODUODENOSCOPY (EGD);  Surgeon: Inda Castle, MD;  Location: [facility];  Service: Endoscopy;  Laterality: N/A;  . Savory dilation N/A 07/26/2013    Procedure: SAVORY DILATION;  Surgeon: Inda Castle, MD;  Location: [facility];  Service: Endoscopy;  Laterality: N/A;   Family History  Problem Relation Age of Onset  . Stroke Other   . Diabetes Mother   . Cancer Maternal Uncle     esophagus  . Cancer Maternal Grandfather     esophagus  . Heart disease Mother   . Deep vein thrombosis Sister    History  Substance Use Topics  . Smoking status: Former Smoker -- 1.00 packs/day for 40 years    Types: Cigarettes    Quit date: 09/09/2008  . Smokeless tobacco: Never Used  . Alcohol Use: No     Comment: rarely    Review of Systems  Constitutional: Positive for appetite change. Negative for fever and chills.  HENT: Negative for congestion.   Eyes: Negative for visual disturbance.  Respiratory: Positive for cough (dry) and shortness of breath.   Cardiovascular: Positive for chest pain. Negative for leg swelling.  Gastrointestinal: Positive for nausea. Negative for vomiting and abdominal pain.  Genitourinary: Negative for dysuria and flank pain.  Musculoskeletal: Negative for back pain, neck pain and neck stiffness.  Skin: Negative for rash.  Neurological: Positive for light-headedness. Negative for headaches.      Allergies  Azithromycin; Erythromycin; Fentanyl; and Penicillins  Home Medications   Current Outpatient Rx  Name  Route  Sig  Dispense  Refill  . albuterol (PROVENTIL HFA;VENTOLIN HFA) 108 (90 BASE) MCG/ACT inhaler   Inhalation   Inhale into the lungs every 6 (six) hours as needed for wheezing or shortness of breath.         Marland Kitchen albuterol  (PROVENTIL) (2.5 MG/3ML) 0.083% nebulizer solution   Nebulization   Take 2.5 mg by nebulization every 6 (six) hours as needed for wheezing or shortness of breath.         . Alum & Mag Hydroxide-Simeth (MAGIC MOUTHWASH W/LIDOCAINE) SOLN   Oral   Take 5 mLs by mouth 4 (four) times daily as needed for mouth pain.         Marland Kitchen atorvastatin (LIPITOR) 40 MG tablet   Oral   Take 40 mg by mouth daily.          Marland Kitchen enoxaparin (LOVENOX) 150 MG/ML injection   Subcutaneous   Inject 0.93 mLs (140 mg total) into the skin daily.   32 Syringe   0   . HYDROcodone-acetaminophen (HYCET) 7.5-325 mg/15 ml solution   Oral   Take 10 mLs  by mouth 4 (four) times daily as needed for moderate pain.   500 mL   0   . isosorbide mononitrate (IMDUR) 60 MG 24 hr tablet   Oral   Take 60 mg by mouth every morning.         . metoprolol tartrate (LOPRESSOR) 25 MG tablet   Oral   Take 25 mg by mouth 2 (two) times daily.         . nitroGLYCERIN (NITROSTAT) 0.4 MG SL tablet   Sublingual   Place 0.4 mg under the tongue every 5 (five) minutes as needed for chest pain. May repeat up to 3 doses         . nystatin (MYCOSTATIN) 100000 UNIT/ML suspension      TAKE 5 MLS BY MOUTH 4 (FOUR) TIMES DAILY AS NEEDED FOR MOUTH PAIN.   480 mL   1   . pantoprazole (PROTONIX) 40 MG tablet   Oral   Take 40 mg by mouth 2 (two) times daily.          BP 140/94  Pulse 105  Temp(Src) 97.4 F (36.3 C) (Oral)  Resp 20  SpO2 98% Physical Exam  Nursing note and vitals reviewed. Constitutional: He is oriented to person, place, and time. He appears well-developed and well-nourished.  HENT:  Head: Normocephalic and atraumatic.  Eyes: Conjunctivae are normal. Right eye exhibits no discharge. Left eye exhibits no discharge.  Neck: Normal range of motion. Neck supple. No tracheal deviation present.  Cardiovascular: Regular rhythm.  Tachycardia present.   Pulmonary/Chest: Effort normal. He has wheezes (few end exp).   Abdominal: Soft. He exhibits no distension. There is no tenderness. There is no guarding.  Musculoskeletal: He exhibits no edema.  Neurological: He is alert and oriented to person, place, and time. No cranial nerve deficit.  Skin: Skin is warm. No rash noted.  Psychiatric: He has a normal mood and affect.    ED Course  Procedures (including critical care time) CRITICAL CARE Performed by: Mariea Clonts   Total critical care time: 35 min  Critical care time was exclusive of separately billable procedures and treating other patients.  Critical care was necessary to treat or prevent imminent or life-threatening deterioration.  Critical care was time spent personally by me on the following activities: development of treatment plan with patient and/or surrogate as well as nursing, discussions with consultants, evaluation of patient's response to treatment, examination of patient, obtaining history from patient or surrogate, ordering and performing treatments and interventions, ordering and review of laboratory studies, ordering and review of radiographic studies, pulse oximetry and re-evaluation of patient's condition.  Labs Review Labs Reviewed  MRSA PCR SCREENING - Abnormal; Notable for the following:    MRSA by PCR POSITIVE (*)    All other components within normal limits  BASIC METABOLIC PANEL - Abnormal; Notable for the following:    Sodium 136 (*)    Glucose, Bld 230 (*)    GFR calc non Af Amer 59 (*)    GFR calc Af Amer 68 (*)    All other components within normal limits  CBC WITH DIFFERENTIAL - Abnormal; Notable for the following:    Neutrophils Relative % 79 (*)    Lymphocytes Relative 11 (*)    All other components within normal limits  TROPONIN I - Abnormal; Notable for the following:    Troponin I 0.45 (*)    All other components within normal limits  PRO B NATRIURETIC PEPTIDE - Abnormal; Notable  for the following:    Pro B Natriuretic peptide (BNP) 1986.0 (*)     All other components within normal limits  CBC - Abnormal; Notable for the following:    RBC 3.93 (*)    Hemoglobin 12.3 (*)    HCT 37.2 (*)    All other components within normal limits  TROPONIN I - Abnormal; Notable for the following:    Troponin I 8.47 (*)    All other components within normal limits  BASIC METABOLIC PANEL - Abnormal; Notable for the following:    Sodium 136 (*)    Glucose, Bld 136 (*)    GFR calc non Af Amer 67 (*)    GFR calc Af Amer 77 (*)    All other components within normal limits  LIPID PANEL - Abnormal; Notable for the following:    HDL 36 (*)    All other components within normal limits  HEPARIN LEVEL (UNFRACTIONATED) - Abnormal; Notable for the following:    Heparin Unfractionated 0.10 (*)    All other components within normal limits  HEPARIN LEVEL (UNFRACTIONATED) - Abnormal; Notable for the following:    Heparin Unfractionated 0.27 (*)    All other components within normal limits  TROPONIN I  HEPARIN LEVEL (UNFRACTIONATED)  HEMOGLOBIN A1C  TROPONIN I  TROPONIN I  HEPARIN LEVEL (UNFRACTIONATED)  CBC  HEPARIN LEVEL (UNFRACTIONATED)   Imaging Review Dg Chest 2 View  08/20/2013   CLINICAL DATA:  Chest pain and shortness of breath for 1 day  EXAM: CHEST  2 VIEW  COMPARISON:  CT CHEST W/CM dated 08/03/2013; DG CHEST 1V PORT dated 04/29/2013  FINDINGS: Mild cardiac enlargement is stable. Vascular pattern is normal. There is an irregular 1.5 cm opacity laterally in the right lower lobe that was not present previously. There is mild diffuse interstitial change bilaterally, mildly worse on the left. There is and mild increased in severity of this process, some of which if not all may be related to technique. The findings do not appear consistent with pulmonary edema. There are no pleural effusions. There is a 1 cm oval lytic area in the lateral right sixth rib.  IMPRESSION: Irregular right lower lobe opacity concerning for pulmonary nodule. CT thorax  recommended to further evaluate.  Mild diffuse interstitial change left lung greater than right. Significance is uncertain. The finding does not appear to be related to pulmonary edema. Atypical pneumonia is a consideration. Chronic interstitial lung disease is another consideration, as would be lymphangitic metastasis.  Evidence of lytic lesion involving the lateral right sixth rib, metastasis not excluded.   Electronically Signed   By: Skipper Cliche M.D.   On: 08/20/2013 16:40   Ct Angio Chest Pe W/cm &/or Wo Cm  08/20/2013   CLINICAL DATA:  Severe shortness of breath and known blood clots, being treated with Lovenox, history of lung cancer with chemotherapy and radiation therapy completed, chest pain with burning sensation  EXAM: CT ANGIOGRAPHY CHEST WITH CONTRAST  TECHNIQUE: Multidetector CT imaging of the chest was performed using the standard protocol during bolus administration of intravenous contrast. Multiplanar CT image reconstructions and MIPs were obtained to evaluate the vascular anatomy.  CONTRAST:  115mL OMNIPAQUE IOHEXOL 350 MG/ML SOLN  COMPARISON:  DG CHEST 2 VIEW dated 08/20/2013; CT CHEST W/CM dated 08/03/2013  FINDINGS: Again identified is pulmonary embolism involving right middle lobe and right lower lobe branches as well as left lower lobe and lingular branches. Clot burden is moderate but slightly decreased when compared  to prior study. RV to LV ratio is again 0.77 with no evidence of right heart strain.  There is a moderate pericardial effusion measuring up to about 28 mm. Right paratracheal adenopathy stable.  Chronic right rib deformities stable. Diffuse emphysematous change stable. Postsurgical change right perihilar area, unchanged from prior examination. On the left side, there is an irregular lower lobe mass measuring 14 x 11 mm. It is increased in degree of consolidation and size when compared to recent prior study and is consistent with metastasis. In the left midlung zone there is  an irregular opacity measuring 19 x 16 mm, not significantly different from the prior study. There is mild increase in the degree of diffuse interstitial prominence in the left central lung region. There is a tiny left pleural effusion, similar to the prior study. Pericardial effusion has mildly increased when compared to the prior study.  No significant acute abnormality involving the thoracic aorta. Scans through the upper abdomen are unremarkable.  Review of the MIP images confirms the above findings.  IMPRESSION: Slightly decreased clot burden when compared to prior study.  Mild increase in the size of the pericardial effusion.  Stable nodule in the lingula concerning for metastasis. Enlarging left lower lobe irregular nodule concerning for metastasis. 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   Electronically Signed   By: Skipper Cliche M.D.   On: 08/20/2013 17:38     EKG Interpretation   Date/Time:  Saturday August 20 2013 17:33:26 EDT Ventricular Rate:  109 PR Interval:  137 QRS Duration: 116 QT Interval:  359 QTC Calculation: 483 R Axis:   -6 Text Interpretation:  Sinus tachycardia Nonspecific intraventricular  conduction delay Low voltage, extremity and precordial leads Borderline  repolarization abnormality Confirmed by Jordanne Elsbury  MD, Nela Bascom ([id number]) on  08/20/2013 5:38:13 PM      MDM   Final diagnoses:  NSTEMI (non-ST elevated myocardial infarction)  Chest pain  Submassive pulmonary embolism  Dyspnea  Pericardial Effusion  Differential CAD/ NSTEMI/ Angina vs PE vs GI vs less likely COPD (minimal wheeze) vs other. Plan for cardiac and PE work up. Atypical burning cp. Pain meds and small fluid bolus.  CT angio ordered. CT showed possible mets and enlarged pericardial effusion.  Multiple rechecks/ pain meds.  [facility] consulted for admission.  Troponin pos, nitro, heparin drip started. [facility] was contacted for pos troponin, they will consult  cardiology.    The patients results and plan were reviewed and discussed.   Any x-rays performed were personally reviewed by myself.   Differential diagnosis were considered with the presenting HPI.  EKG: reviewed, no stemi  Filed Vitals:   08/20/13 1542  BP: 140/94  Pulse: 105  Temp: 97.4 F (36.3 C)  TempSrc: Oral  Resp: 20  SpO2: 98%    Admission/ observation were discussed with the admitting physician, patient and/or family and they are comfortable with the plan.     Mariea Clonts, MD 08/21/13 [phone]

## 2013-08-20 NOTE — H&P (Addendum)
History and Physical       Hospital Admission Note Date: 08/20/2013  Patient name: [name] Medical record number: [id number] Date of birth: [birth year] Age: 58 y.o. 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 The patient describes the chest pain as burning, sometimes radiating towards the neck, worsens intermittently. Patient reports that he had "heart attack" twice and this is not "his heart", no worsening chest pain with deep breathing or coughing.  Patient had recent echocardiogram done in March 2015 which showed EF 55-60% with grade 1 diastolic dysfunction. EKG does not show acute ST-T wave changes just of ischemia, troponin x1 is negative. Patient has a known history of radiation-induced esophagitis and moderate to severe esophageal stricture, has been having serial dilatations, currently scheduled to have another dilation, last done on 07/26/13 by Dr Deatra Ina The patient has been eating pure or liquid diet. At the time of my encounter chest pain has improved.   Review of Systems:  Constitutional: Denies fever, chills, diaphoresis,++ poor appetite and fatigue.  HEENT: Denies photophobia, eye pain, redness, hearing loss, ear pain, congestion, sore throat, rhinorrhea, sneezing, mouth sores,neck pain, neck stiffness and tinnitus.   patient has a known history of dysphagia please see history of present illness Respiratory: Denies SOB, DOE, cough, chest tightness,  and wheezing.   Cardiovascular: Please see history of present illness  Gastrointestinal: Denies  nausea, vomiting, abdominal pain, diarrhea, constipation, blood in stool and abdominal distention.  Genitourinary: Denies dysuria, urgency, frequency, hematuria, flank pain and difficulty urinating.  Musculoskeletal: Denies myalgias, back pain, joint swelling, arthralgias and gait problem.  Skin: Denies pallor, rash and wound.  Neurological: Denies dizziness, seizures, syncope, weakness, light-headedness, numbness and headaches.  Hematological: Denies adenopathy. Easy bruising, personal or family bleeding history  Psychiatric/Behavioral: Denies suicidal ideation, mood changes, confusion, nervousness, sleep disturbance and agitation  Past Medical History: Past Medical History  Diagnosis Date  . CAD (coronary artery disease)     a. PCI 3/08 to OM2 and mid CFX; b.  NSTEMI 6/10:  Prior stents patent, 90% dCFX,  mRCA occl (old) with collats; EF 55% on LV-gram => Xience DES 2.5 x 23 mm to distal CFX;  c. abnl MV 12/13 => LHC 04/23/12:  pLAD 50%, oOM1 80%, long stented segment of the CFX extending into the PLOM with the PLOM totally occluded ostially, CFX 70% ISR, mCFX 80% ISR, pRCA chronically occluded. Med Rx planned  . Diabetes mellitus type II   . Hyperlipidemia   . COPD (chronic obstructive pulmonary disease)     Quit tobacco 09/2009; Gold Stage 2 with asthma component - FEV1 1.53 L/54%, 14% fev1 BD response, DLCO 54% - July 2011; MM genotype 01/07/10; unable to afford spiriva and unwilling to try ics due to prior renal failure: state to Dr. [name], Aug 2011; started on atrovent HFA fall 2011. no desturation on walk test Dec 2011  . CKD (chronic kidney disease)   . Obesity   . History of CVA (cerebrovascular accident) 2007    Without residual deficits  . 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 no further rx  . CHF (congestive heart failure)   . Hypertension   . Asthma     as child  . Cancer     lung cancer-02/14/2013  . Blood transfusion without reported diagnosis 2015  . Radiation 03/16/13-04/28/13    Left lung 60Gy  . Pneumonia     02/14/2013 in hospital  . History of chemotherapy finished dec 2014  . Complication of anesthesia 05-04-14 woke up during esophagus stretching  . History of home oxygen therapy     uses oxygen 2 liters per [place] prn  . Myocardial infarction     x3  . Stroke     7 years- no problems   . Shortness of breath     constant/wears 2 liters prn  . Pulmonary embolism 08-03-2013    bilateral   Past Surgical History  Procedure Laterality Date  . Wedge resection  03/28/2010  . Coronary stent placement  2009    x 2  . Cardiac catheterization  2009  . Endobronchial ultrasound Bilateral 02/28/2013    Procedure: ENDOBRONCHIAL ULTRASOUND;  Surgeon: Brand Males, MD;  Location: [facility] END[facility];  Service: Cardiopulmonary;  Laterality: Bilateral;  . Esophagogastroduodenoscopy N/A 05/04/2013    Procedure: ESOPHAGOGASTRODUODENOSCOPY (EGD);  Surgeon: Inda Castle, MD;  Location: [facility];  Service: Endoscopy;  Laterality: N/A;  . Esophagogastroduodenoscopy N/A 07/06/2013    Procedure: ESOPHAGOGASTRODUODENOSCOPY (EGD);  Surgeon: Inda Castle, MD;  Location: [facility];  Service: Endoscopy;  Laterality: N/A;  . Savory dilation N/A 07/06/2013    Procedure: SAVORY DILATION;  Surgeon: Inda Castle, MD;  Location: [facility];  Service: Endoscopy;  Laterality: N/A;  . Esophagogastroduodenoscopy N/A 07/14/2013    Procedure: ESOPHAGOGASTRODUODENOSCOPY (EGD);  Surgeon: Inda Castle, MD;  Location: [facility];  Service: Endoscopy;  Laterality: N/A;  . Savory dilation N/A 07/14/2013    Procedure: SAVORY DILATION;  Surgeon: Inda Castle, MD;  Location: [facility];  Service: Endoscopy;  Laterality: N/A;  . Esophagogastroduodenoscopy N/A 07/26/2013    Procedure: ESOPHAGOGASTRODUODENOSCOPY (EGD);  Surgeon: Inda Castle, MD;  Location: [facility];  Service: Endoscopy;  Laterality: N/A;  . Savory dilation N/A 07/26/2013    Procedure: SAVORY DILATION;  Surgeon: Inda Castle, MD;  Location: [facility];  Service: Endoscopy;  Laterality: N/A;    Medications: Prior to Admission medications   Medication Sig Start Date End Date Taking? Authorizing Provider  albuterol (PROVENTIL HFA;VENTOLIN HFA) 108 (90 BASE) MCG/ACT inhaler Inhale into the lungs every 6 (six) hours as needed for wheezing or shortness of breath.   Yes Historical Provider, MD  albuterol (PROVENTIL) (2.5 MG/3ML) 0.083% nebulizer solution Take 2.5 mg by nebulization every 6 (six) hours as needed for wheezing or shortness of breath.   Yes Historical Provider, MD  Alum & Mag Hydroxide-Simeth (MAGIC MOUTHWASH W/LIDOCAINE) SOLN Take 5 mLs by mouth 4 (four) times daily as needed for mouth pain.   Yes Historical Provider, MD  atorvastatin (LIPITOR) 40 MG tablet Take 40 mg by mouth daily.    Yes Historical Provider, MD  enoxaparin (LOVENOX) 150 MG/ML injection Inject 0.93 mLs (140 mg total) into the skin daily. 08/04/13  Yes Janece Canterbury, MD  HYDROcodone-acetaminophen (HYCET) 7.5-325 mg/15 ml solution Take 10 mLs  by mouth 4 (four) times daily as needed for moderate pain. 08/18/13  Yes Thea Silversmith, MD  isosorbide mononitrate (IMDUR) 60 MG 24 hr tablet Take 60 mg by mouth every morning.   Yes Historical Provider, MD  metoprolol tartrate (LOPRESSOR) 25 MG tablet Take 25 mg by mouth 2 (two) times daily.   Yes Historical Provider, MD  nitroGLYCERIN (NITROSTAT) 0.4 MG SL tablet Place 0.4 mg under the tongue every 5 (five) minutes as needed for chest pain. May repeat up to 3 doses   Yes Historical Provider, MD  pantoprazole (PROTONIX) 40 MG tablet Take 40 mg by mouth 2 (two) times daily.   Yes  Historical Provider, MD    Allergies:   Allergies  Allergen Reactions  . Azithromycin     Reaction while in hospital - pt doesn't know reaction  . Erythromycin Other (See Comments)    Reaction while in hospital - pt doesn't know reaction  . Fentanyl Other (See Comments)    Delirium and violent  . Penicillins Other (See Comments)    Childhood reaction - unknown    Social History:  reports that he quit smoking about 4 years ago. His smoking use included Cigarettes. He has a 40 pack-year smoking history. He has never used smokeless tobacco. He reports that he does not drink alcohol or use illicit drugs.  Family History: Family History  Problem Relation Age of Onset  . Stroke Other   . Diabetes Mother   . Cancer Maternal Uncle     esophagus  . Cancer Maternal Grandfather     esophagus  . Heart disease Mother   . Deep vein thrombosis Sister     Physical Exam: Blood pressure 151/103, pulse 104, temperature 97.4 F (36.3 C), temperature source Oral, resp. rate 27, SpO2 99.00%. General: Alert, awake, oriented x3, in no acute distress. HEENT: normocephalic, atraumatic, anicteric sclera, pink conjunctiva, pupils equal and reactive to light and accomodation, oropharynx clear Neck: supple, no masses or lymphadenopathy, no goiter, no bruits  Heart: Regular rate and rhythm, without murmurs, rubs or gallops. Lungs: Clear to auscultation bilaterally, no wheezing, rales or rhonchi. No chest wall tenderness Abdomen: Soft, nontender, nondistended, positive bowel sounds, no masses. Extremities: No clubbing, cyanosis or edema with positive pedal pulses. Neuro: Grossly intact, no focal neurological deficits, strength 5/5 upper and lower extremities bilaterally Psych: alert and oriented x 3, normal mood and affect Skin: no rashes or lesions, warm and dry   LABS on Admission:  Basic Metabolic Panel:  Recent Labs Lab 08/20/13 1545  NA 136*  K 4.3  CL 96  CO2 25  GLUCOSE 230*  BUN 20   CREATININE 1.31  CALCIUM 9.7   Liver Function Tests: No results found for this basename: AST, ALT, ALKPHOS, BILITOT, PROT, ALBUMIN,  in the last 168 hours No results found for this basename: LIPASE, AMYLASE,  in the last 168 hours No results found for this basename: AMMONIA,  in the last 168 hours CBC:  Recent Labs Lab 08/20/13 1545  WBC 6.3  NEUTROABS 5.0  HGB 13.8  HCT 41.1  MCV 93.6  PLT 176   Cardiac Enzymes:  Recent Labs Lab 08/20/13 1545  TROPONINI <0.30   BNP: No components found with this basename: POCBNP,  CBG: No results found for this basename: GLUCAP,  in the last 168 hours   Radiological Exams on Admission: Dg Chest 2 View  08/20/2013   CLINICAL DATA:  Chest pain and shortness of breath for 1 day  EXAM: CHEST  2 VIEW  COMPARISON:  CT CHEST W/CM dated 08/03/2013; DG CHEST 1V PORT dated 04/29/2013  FINDINGS: Mild cardiac enlargement is stable. Vascular pattern is normal. There is an irregular 1.5 cm opacity laterally in the right lower lobe that was not present previously. There is mild diffuse interstitial change bilaterally, mildly worse on the left. There is and mild increased in severity of this process, some of which if not all may be related to technique. The findings do not appear consistent with pulmonary edema. There are no pleural effusions. There is a 1 cm oval lytic area in the lateral right sixth rib.  IMPRESSION: Irregular right lower lobe opacity concerning for pulmonary nodule. CT thorax recommended to further evaluate.  Mild diffuse interstitial change left lung greater than right. Significance is uncertain. The finding does not appear to be related to pulmonary edema. Atypical pneumonia is a consideration. Chronic interstitial lung disease is another consideration, as would be lymphangitic metastasis.  Evidence of lytic lesion involving the lateral right sixth rib, metastasis not excluded.   Electronically Signed   By: Skipper Cliche M.D.   On:  08/20/2013 16:40   Ct Head W Wo Contrast  08/03/2013   CLINICAL DATA:  58 year old male with small cell carcinoma of the lung. Restaging. Subsequent encounter.  EXAM: CT HEAD WITHOUT AND WITH CONTRAST  TECHNIQUE: Contiguous axial images were obtained from the base of the skull through the vertex without and with intravenous contrast  CONTRAST:  162mL OMNIPAQUE IOHEXOL 300 MG/ML SOLN in conjunction with contrast enhanced imaging of the chest reported separately.  COMPARISON:  Brain MRI 03/14/2013.  FINDINGS: Visible paranasal sinuses and mastoids are stable and clear. Negative visualized osseous structures. Negative scalp and orbits soft tissues.  Stable cerebral volume. No midline shift, ventriculomegaly, mass effect, evidence of mass lesion, intracranial hemorrhage or evidence of cortically based acute infarction. Gray-white matter differentiation is within normal limits throughout the brain. No abnormal enhancement identified. Major intracranial vascular structures are enhancing.  IMPRESSION: 1. Stable and normal CT appearance of the brain. 2. Chest CT from today reported separately.   Electronically Signed   By: Lars Pinks M.D.   On: 08/03/2013 13:47   Ct Chest W Contrast  08/03/2013   CLINICAL DATA:  Small cell lung cancer diagnosed 02/2013, chemotherapy and XRT complete, shortness of breath, dysphagia, weight loss.  EXAM: CT CHEST WITH CONTRAST  TECHNIQUE: Multidetector CT imaging of the chest was performed during intravenous contrast administration.  CONTRAST:  163mL OMNIPAQUE IOHEXOL 300 MG/ML  SOLN  COMPARISON:  CT chest dated 05/02/2013  FINDINGS: Although study was not tailored for optimal evaluation of the pulmonary arteries, there are bilateral pulmonary emboli involving the right middle lobe and bilateral lower lobe pulmonary arteries, as well as the lingular branch. Overall clot burden is moderate. No findings to suggest right heart strain, with an RV-to-LV ratio of 0.77.  Postsurgical changes  related to prior right lung wedge resection. Radiation changes in the superior segment left lower lobe.  New nodularity in the lingula measuring up to 1.7 x 1.5 cm (series 11/ image 21). Additional 11 mm irregular nodular opacity in the left lower lobe (series 11/ image 49), new).  Moderate to severe centrilobular and paraseptal emphysematous changes. Mild scarring in the anterolateral right lung base (series 11/ image 40), unchanged. No pleural effusion or pneumothorax.  Visualized thyroid is unremarkable.  Heart is normal in size. Small pericardial effusion, new/increased. Atherosclerotic calcifications of the aortic arch. Coronary atherosclerosis.  1.4 cm short axis right paratracheal node (series 5/ image 10), previously 1.2 cm.  Wall thickening/edema involving the mid esophagus (series 5/image 27), unchanged.  Visualized upper abdomen is unremarkable.  Degenerative changes of the visualized thoracolumbar spine. Multiple right rib defects related to prior right thoracotomy.  IMPRESSION: Bilateral pulmonary emboli, as described above. Overall clot burden is moderate.  Prior right lung wedge resection with radiation changes in the superior segment left lower lobe.  New nodularity in the lingula and left lower lobe, measuring up to 1.7 cm. While nonspecific, possibly post infectious/inflammatory and/or related to radiation changes, metastatic disease is not excluded. Attention on follow-up is suggested.  Mildly progressive right paratracheal lymphadenopathy measuring up to 1.4 cm, worrisome for nodal recurrence.  Critical value/emergent results were called by telephone at the time of interpretation on 08/03/2013 at 10:50 AM to Dr. Curt Bears , who verbally acknowledged these results. At his request, the patient was notified of the results and sent to the ED.   Electronically Signed   By: Julian Hy M.D.   On: 08/03/2013 11:07   Ct Angio Chest Pe W/cm &/or Wo Cm  08/20/2013   CLINICAL DATA:  Severe  shortness of breath and known blood clots, being treated with Lovenox, history of lung cancer with chemotherapy and radiation therapy completed, chest pain with burning sensation  EXAM: CT ANGIOGRAPHY CHEST WITH CONTRAST  TECHNIQUE: Multidetector CT imaging of the chest was performed using the standard protocol during bolus administration of intravenous contrast. Multiplanar CT image reconstructions and MIPs were obtained to evaluate the vascular anatomy.  CONTRAST:  115mL OMNIPAQUE IOHEXOL 350 MG/ML SOLN  COMPARISON:  DG CHEST 2 VIEW dated 08/20/2013; CT CHEST W/CM dated 08/03/2013  FINDINGS: Again identified is pulmonary embolism involving right middle lobe and right lower lobe branches as well as left lower lobe and lingular branches. Clot burden is moderate but slightly decreased when compared to prior study. RV to LV ratio is again 0.77 with no evidence of right heart strain.  There is a moderate pericardial effusion measuring up to about 28 mm. Right paratracheal adenopathy stable.  Chronic right rib deformities stable. Diffuse emphysematous change stable. Postsurgical change right perihilar area, unchanged from prior examination. On the left side, there is an irregular lower lobe mass measuring 14 x 11 mm. It is increased in degree of consolidation and size when compared to recent prior study and is consistent with metastasis. In the left midlung zone there is an irregular opacity measuring 19 x 16 mm, not significantly different from the prior study. There is mild increase in the degree of diffuse interstitial prominence in the left central lung region. There is a tiny left pleural effusion, similar to the prior study. Pericardial effusion has mildly increased when compared to the prior study.  No significant acute abnormality involving the thoracic aorta. Scans through the upper abdomen are unremarkable.  Review of the MIP images confirms the above findings.  IMPRESSION: Slightly decreased clot burden when  compared to prior study.  Mild increase in the size of the pericardial effusion.  Stable nodule in the lingula concerning for metastasis. Enlarging left lower lobe irregular nodule concerning for metastasis. 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   Electronically Signed   By: Skipper Cliche M.D.   On: 08/20/2013 17:38   Dg Esophagus Dilatation  07/26/2013   CLINICAL DATA: esophageal stricture   ESOPHAGEAL DILATATION  Fluoroscopy was provided for use by the requesting physician.  No images  were obtained for radiographic interpretation.     Assessment/Plan Principal Problem:   Atypical chest pain: Multifactorial but most likely due to moderate to severe stricture/stenosis of esophagus and radiation-induced esophagitis. However he does have history of recent pulmonary embolism, currently on therapeutic Lovenox and history of CAD. CT angiogram of the chest showed slightly decreased clot burden, enlarging left lower lobe irregular nodule concerning for metastasis. - Will admit for observation, rule out acute ACS with serial cardiac enzymes, on therapeutic anticoagulation - Will continue pain control and also give one dose of GI cocktail in the ER and placed on IV Protonix - If cardiac enzymes is positive, will consult cardiology - I discussed in detail with Dr. Deatra Ina, who is on call for gastroenterology and patient's primary gastroenterologist, who reported that this is not a new issue and patient has been having intermittent chest pain issues after radiation due to his severe esophageal stenosis. Dr. Deatra Ina recommended to control the chest pain and patient can discharged tomorrow to keep his outpatient scheduled EGD for another dilatation. However if anything changes, to call GI in a.m. - For now, we'll place the patient IV Protonix,GI cocktail, clear liquid diet   Active Problems:   CAD, NATIVE VESSEL - Currently chest pain improved, continue to monitor  closely, rule out acute ACS     Small cell carcinoma of lung - Patient is following radiation oncology and Dr. Julien Nordmann. CT angiogram of the chest showed slightly decreased clot burden, enlarging left lower lobe irregular nodule concerning for metastasis, increased interstitial opacities left lung concerning for lymphangitis metastasis - I have added Dr. Julien Nordmann in the treatment team, consider oncology consult in a.m. for any recommendations regarding metastasis, he recently followed up with Dr. Lance Morin ( radiation oncology) on 4/9 for starting prophylactic cranial irradiation.      Protein-calorie malnutrition, severe - Unfortunately due to malignancy and severe esophageal stricture, continue clear liquid diet      Radiation esophagitis with dysphagia  - Continue clear liquid diet for now, advance to full liquids if able to tolerate tomorrow. Patient is eating soft pured diet and liquids.     Submassive pulmonary embolism - Will continue IV heparin drip for now. If his chest pain is improved and ruled out for acute ACS, will transitioned to subcutaneous Lovenox therapeutic dose tomorrow  DVT prophylaxis:  on heparin drip   CODE STATUS:  FULL CODE STATUS   Family Communication: Admission, patients condition and plan of care including tests being ordered have been discussed with the patient and  family members who indicates understanding and agree with the plan and Code Status   Further plan will depend as patient's clinical course evolves and further radiologic and laboratory data become available.   Time Spent on Admission: 1 hour  Lawrnce Reyez Krystal Eaton M.D. [facility] 08/20/2013, 6:33 PM Pager: [phone]  If 7PM-7AM, please contact night-coverage [email or web address] Password [email or web address]  Addendum - Second set of troponin positive at 0.45 - Continue heparin drip, chest pain has resolved, continue aspirin, beta blocker, Imdur, statin - I have paged cardiology for consult for  NSTEMI. Discussed with on-call cardiology fellow, Dr. Inda Castle, who recommended continue current management. If the third set of troponin is elevated to call him back and patient will need to be transferred to [facility] as he may need cardiac cath or further cardiology evaluation. D/w [facility] PA to monitor closely for 3rd set and above recs.    Geoffrey Mankin Krystal Eaton M.D. [facility] 08/20/2013, 7:32 PM  Pager: [phone]

## 2013-08-21 ENCOUNTER — Other Ambulatory Visit: Payer: Self-pay

## 2013-08-21 DIAGNOSIS — I319 Disease of pericardium, unspecified: Secondary | ICD-10-CM

## 2013-08-21 DIAGNOSIS — R0789 Other chest pain: Secondary | ICD-10-CM

## 2013-08-21 DIAGNOSIS — I214 Non-ST elevation (NSTEMI) myocardial infarction: Secondary | ICD-10-CM | POA: Diagnosis present

## 2013-08-21 DIAGNOSIS — I5032 Chronic diastolic (congestive) heart failure: Secondary | ICD-10-CM | POA: Diagnosis present

## 2013-08-21 HISTORY — DX: Non-ST elevation (NSTEMI) myocardial infarction: I21.4

## 2013-08-21 LAB — HEPARIN LEVEL (UNFRACTIONATED)
Heparin Unfractionated: 0.59 IU/mL (ref 0.30–0.70)
Heparin Unfractionated: 0.1 IU/mL — ABNORMAL LOW (ref 0.30–0.70)
Heparin Unfractionated: 0.27 IU/mL — ABNORMAL LOW (ref 0.30–0.70)
Heparin Unfractionated: 0.54 IU/mL (ref 0.30–0.70)

## 2013-08-21 LAB — LIPID PANEL
Cholesterol: 130 mg/dL (ref 0–200)
HDL: 36 mg/dL — AB (ref >39)
LDL CALC: 68 mg/dL (ref 0–99)
Total CHOL/HDL Ratio: 3.6 RATIO
Triglycerides: 130 mg/dL (ref <150)
VLDL: 26 mg/dL (ref 0–40)

## 2013-08-21 LAB — CBC
HEMATOCRIT: 37.2 % — AB (ref 39.0–52.0)
Hemoglobin: 12.3 g/dL — ABNORMAL LOW (ref 13.0–17.0)
MCH: 31.3 pg (ref 26.0–34.0)
MCHC: 33.1 g/dL (ref 30.0–36.0)
MCV: 94.7 fL (ref 78.0–100.0)
PLATELETS: 158 10*3/uL (ref 150–400)
RBC: 3.93 MIL/uL — ABNORMAL LOW (ref 4.22–5.81)
RDW: 14.9 % (ref 11.5–15.5)
WBC: 4.6 10*3/uL (ref 4.0–10.5)

## 2013-08-21 LAB — BASIC METABOLIC PANEL
BUN: 19 mg/dL (ref 6–23)
CALCIUM: 9.1 mg/dL (ref 8.4–10.5)
CHLORIDE: 100 meq/L (ref 96–112)
CO2: 26 meq/L (ref 19–32)
CREATININE: 1.18 mg/dL (ref 0.50–1.35)
GFR calc Af Amer: 77 mL/min — ABNORMAL LOW (ref >90)
GFR calc non Af Amer: 67 mL/min — ABNORMAL LOW (ref >90)
GLUCOSE: 136 mg/dL — AB (ref 70–99)
Potassium: 4.8 mEq/L (ref 3.7–5.3)
Sodium: 136 mEq/L — ABNORMAL LOW (ref 137–147)

## 2013-08-21 LAB — HEMOGLOBIN A1C
Hgb A1c MFr Bld: 5.6 % (ref <5.7)
Mean Plasma Glucose: 114 mg/dL (ref <117)

## 2013-08-21 LAB — TROPONIN I: Troponin I: 8.47 ng/mL (ref <0.30)

## 2013-08-21 LAB — MRSA PCR SCREENING: MRSA BY PCR: POSITIVE — AB

## 2013-08-21 MED ORDER — SODIUM CHLORIDE 0.9 % IJ SOLN: 3.0000 mL | INTRAMUSCULAR | Status: DC | PRN

## 2013-08-21 MED ORDER — SODIUM CHLORIDE 0.9 % IV SOLN
1.0000 mL/kg/h | INTRAVENOUS | Status: DC
  Administered 2013-08-22: 1 mL/kg/h via INTRAVENOUS

## 2013-08-21 MED ORDER — SODIUM CHLORIDE 0.9 % IV SOLN: 250.0000 mL | INTRAVENOUS | Status: DC | PRN

## 2013-08-21 MED ORDER — ASPIRIN 81 MG PO CHEW
324.0000 mg | CHEWABLE_TABLET | ORAL | Status: AC
  Administered 2013-08-21: 324 mg via ORAL
  Filled 2013-08-21: qty 4

## 2013-08-21 MED ORDER — ASPIRIN 300 MG RE SUPP
300.0000 mg | RECTAL | Status: AC
  Filled 2013-08-21: qty 1

## 2013-08-21 MED ORDER — ALBUTEROL SULFATE (2.5 MG/3ML) 0.083% IN NEBU: 3.0000 mL | INHALATION_SOLUTION | Freq: Four times a day (QID) | RESPIRATORY_TRACT | Status: DC | PRN

## 2013-08-21 MED ORDER — ASPIRIN 81 MG PO CHEW
81.0000 mg | CHEWABLE_TABLET | ORAL | Status: AC
  Administered 2013-08-22: 81 mg via ORAL
  Filled 2013-08-21: qty 1

## 2013-08-21 MED ORDER — NITROGLYCERIN IN D5W 200-5 MCG/ML-% IV SOLN
3.0000 ug/min | INTRAVENOUS | Status: DC
  Filled 2013-08-21: qty 250

## 2013-08-21 MED ORDER — MAGIC MOUTHWASH W/LIDOCAINE
5.0000 mL | Freq: Four times a day (QID) | ORAL | Status: DC | PRN
  Administered 2013-08-22 – 2013-08-25 (×5): 5 mL via ORAL
  Filled 2013-08-21 (×8): qty 5

## 2013-08-21 MED ORDER — ASPIRIN EC 81 MG PO TBEC
81.0000 mg | DELAYED_RELEASE_TABLET | Freq: Every day | ORAL | Status: DC
  Administered 2013-08-24 – 2013-08-25 (×2): 81 mg via ORAL
  Filled 2013-08-21 (×4): qty 1

## 2013-08-21 MED ORDER — SODIUM CHLORIDE 0.9 % IJ SOLN
3.0000 mL | Freq: Two times a day (BID) | INTRAMUSCULAR | Status: DC
  Administered 2013-08-22 – 2013-08-23 (×3): 3 mL via INTRAVENOUS

## 2013-08-21 MED ORDER — PANTOPRAZOLE SODIUM 40 MG PO TBEC
40.0000 mg | DELAYED_RELEASE_TABLET | Freq: Two times a day (BID) | ORAL | Status: DC
  Administered 2013-08-21: 40 mg via ORAL
  Filled 2013-08-21: qty 1

## 2013-08-21 MED ORDER — HYDROCODONE-ACETAMINOPHEN 7.5-325 MG/15ML PO SOLN: 10.0000 mL | Freq: Four times a day (QID) | ORAL | Status: DC | PRN

## 2013-08-21 NOTE — Progress Notes (Addendum)
ANTICOAGULATION CONSULT NOTE - Follow Up Consult  Pharmacy Consult for heparin Indication: PE/ACS  Allergies  Allergen Reactions  . Azithromycin     Reaction while in hospital - pt doesn't know reaction  . Erythromycin Other (See Comments)    Reaction while in hospital - pt doesn't know reaction  . Fentanyl Other (See Comments)    Delirium and violent  . Penicillins Other (See Comments)    Childhood reaction - unknown    Patient Measurements: Height: 5\' 10"  (177.8 cm) Weight: 192 lb 10.9 oz (87.4 kg) IBW/kg (Calculated) : 73  Vital Signs: Temp: 96.3 F (35.7 C) (04/12 0300) Temp src: Oral (04/12 0300) BP: 124/83 mmHg (04/12 0900) Pulse Rate: 103 (04/12 0900)  Labs:  Recent Labs  08/20/13 1545 08/20/13 1800 08/21/13 0025 08/21/13 0200 08/21/13 0204 08/21/13 0935  HGB 13.8  --   --   --  12.3*  --   HCT 41.1  --   --   --  37.2*  --   PLT 176  --   --   --  158  --   HEPARINUNFRC  --   --   --  0.59  --  0.10*  CREATININE 1.31  --   --   --  1.18  --   TROPONINI <0.30 0.45* 8.47*  --   --   --     Estimated Creatinine Clearance: 71.3 ml/min (by C-G formula based on Cr of 1.18).   Medications:  Infusions:  . heparin 1,450 Units/hr (08/20/13 1947)  . nitroGLYCERIN      Assessment: 57yoM tranferred from [facility] for cardiac cath tomorrow.  Patient on Lovenox PTA for PE diagnosed on 08/03/13.  To continue heparin drip for PE and ACS.  [facility] HL at [facility] therapeutic at 0.59. Now level subtherapeutic at 0.10.  Per nurse patient pulled IV line for about 30 minutes prior to level being drawn.  IV restarted now so will continue at same rate and check 6 hour heparin level from restart.  H/H low, platelets WNL.  No bleeding noted.   Goal of Therapy:  Heparin level 0.3-0.7 units/ml Monitor platelets by anticoagulation protocol: Yes   Plan:  Heparin 1450 units/hr 6 hour HL Daily HL and CBC  Thank you, Vivia Ewing, PharmD Clinical Pharmacist - Resident Pager:  [phone] Pharmacy: [phone] 08/21/2013 11:44 AM   Addendum  Heparin level slightly low Increase heparin gtt to 1600 units/hr Next HL [place], PharmD, BCPS Clinical Pharmacist Pager: [phone] 08/21/2013 4:59 PM

## 2013-08-21 NOTE — Progress Notes (Signed)
[facility] PROGRESS NOTE  [name] YHC:[id number] DOB: 13-Jun-1955 DOA: 08/20/2013 PCP: [name], MD       Principal Problem:   Atypical chest pain Active Problems:   CAD, NATIVE VESSEL   Small cell carcinoma of lung   Protein-calorie malnutrition, severe   Dysphagia, unspecified(787.20)   Stricture and stenosis of esophagus   Radiation esophagitis   Submassive pulmonary embolism   NSTEMI (non-ST elevated myocardial infarction)   Diastolic CHF      VITAL SIGNS:  Temp: 35.7 Pulse Rate: 88 Resp: 13 BP: 146/106 SpO2: 96% on room air FiO2 (%):   Ventilator settings  Mode  Rate  Tidal Volume  FiO2  PO2/ FIO2  PIP  Plateau      Assessment/Plan: Neuro  Resp COPD -Continue albuterol nebulizer  CVS  NSTEMI -Per cardiology patient for cardiac catheterization on 4/13 -Continue heparin drip -Continue aspirin -Continue NTG drip  Diastolic CHF -  HTN -Continue Imdur 60 mg daily -Continue metoprolol 25 mg BID  GI Stricture/stenosis esophagus -Stable  Renal balance today;        /overall;        Creatinine ;        Hourly output     Endocrine HLD -Continue Lipitor 40 mg daily -Obtain lipid panel  Extremeties  Heme/labs  ID    Code Status: Full Family Communication: None Disposition Plan: Per cardiology    Devices   LINES / TUBES:        Consultants: Dr Glori Bickers (cardiology)     Procedures/SIGNIFICANT EVENTS: Echocardiogram 08/03/2013  Left ventricle: The cavity size was normal. moderate concentric hypertrophy.  -LVEF= 55% to 60%.  -(grade 1 diastolic dysfunction). The E/e' ratio is >10,  suggesting elevated LV filling pressure. - Aortic valve: Trivial central regurgitation. - Mitral valve: Mild regurgitation. - Right ventricle: hypokinetic. - Right atrium: The atrium was normal in size. Diastolic collapse of the RA is noted. - Pericardium,  trivial pericardial effusion  was identified posterior to the heart. Features not consistent with tamponade physiology.    CULTURES:      Antibiotics:      Continuous Infusions: . heparin 1,450 Units/hr (08/20/13 1947)  . nitroGLYCERIN  1.5 mcg/minute       HPI/Subjective: 58 yo WM  PMHx diastolic CHF, HTN, MI x3, S/P PCI with stent x2 placement, atrial fibrillation,   DM type 2, hyperlipidemia, SCLC S/P3 cycles of systemic chemotherapy with carboplatin and etoposide concurrent with radiation, Radiation esophagitis (Dr. Curt Bears oncologist), PE, COPD, HPI ; pt states that for earlier in the day he started experiencing his typical radiation esophagitis symptoms of substernal heart burn , radiating to the sides of his chest and back that was more severe and long lasting which is why he came into the ER at [facility]. Over there he received Nito patch and IV dilaudid and his pain resolved. His EKG was unremarkable for any acute changes . However Troponin increased from 0.45--> 8.47. He was started on heparin gtt and transferred here for further management .  Pt denies any current chest pain , SOB , orthopnea, PND , LE edema , Syncope ,claudcation , focal weakness, or bleeding diathesis . Reports medication compliance . 4/12 negative CP, negative SOB. Patient states,    Exam:   General:  A./O. x4, NAD  Cardiovascular: Regular in rate, negative murmurs rubs gallops,  Respiratory: Diffuse expiratory wheeze  Abdomen: Soft, nontender, nondistended, plus bowel sounds  Musculoskeletal: Negative pedal edema  Data Reviewed: Basic Metabolic Panel:  Recent Labs Lab 08/20/13 1545 08/21/13 0204  NA 136* 136*  K 4.3 4.8  CL 96 100  CO2 25 26  GLUCOSE 230* 136*  BUN 20 19  CREATININE 1.31 1.18  CALCIUM 9.7 9.1   Liver Function Tests: No results found for this basename: AST, ALT, ALKPHOS, BILITOT, PROT, ALBUMIN,  in the last 168 hours No results found for this basename: LIPASE, AMYLASE,   in the last 168 hours No results found for this basename: AMMONIA,  in the last 168 hours CBC:  Recent Labs Lab 08/20/13 1545 08/21/13 0204  WBC 6.3 4.6  NEUTROABS 5.0  --   HGB 13.8 12.3*  HCT 41.1 37.2*  MCV 93.6 94.7  PLT 176 158   Cardiac Enzymes:  Recent Labs Lab 08/20/13 1545 08/20/13 1800 08/21/13 0025  TROPONINI <0.30 0.45* 8.47*   BNP (last 3 results)  Recent Labs  04/28/13 1430 08/03/13 1130 08/20/13 1811  PROBNP 678.8* 2537.0* 1986.0*   CBG: No results found for this basename: GLUCAP,  in the last 168 hours  Recent Results (from the past 240 hour(s))  MRSA PCR SCREENING     Status: Abnormal   Collection Time    08/20/13  9:20 PM      Result Value Ref Range Status   MRSA by PCR POSITIVE (*) NEGATIVE Corrected   Comment: RESULT CALLED TO, READ BACK BY AND VERIFIED WITH:     SPOKE WITH REEVES,M RN [phone] [id number] COVINGTON,N     CORRECTED RESULTS CALLED TO:     REEVES,M RN [id number]     CORRECTED ON 04/12 AT 0429: PREVIOUSLY REPORTED AS POSITIVE        The GeneXpert MRSA Assay (FDA approved for NASAL specimens only), is one component of a comprehensive MRSA colonization surveillance program. It is not intended to diagnose MRSA infection      nor to guide or monitor treatment for MRSA infections. INVALID RESULTS, SPECIMEN SENT FOR CULTURE SPOKE WITH REEVES,M RN [phone] COVINGTON,N     Dg Chest 2 View  08/20/2013   CLINICAL DATA:  Chest pain and shortness of breath for 1 day  EXAM: CHEST  2 VIEW  COMPARISON:  CT CHEST W/CM dated 08/03/2013; DG CHEST 1V PORT dated 04/29/2013  FINDINGS: Mild cardiac enlargement is stable. Vascular pattern is normal. There is an irregular 1.5 cm opacity laterally in the right lower lobe that was not present previously. There is mild diffuse interstitial change bilaterally, mildly worse on the left. There is and mild increased in severity of this process, some of which if not all may be related to technique. The  findings do not appear consistent with pulmonary edema. There are no pleural effusions. There is a 1 cm oval lytic area in the lateral right sixth rib.  IMPRESSION: Irregular right lower lobe opacity concerning for pulmonary nodule. CT thorax recommended to further evaluate.  Mild diffuse interstitial change left lung greater than right. Significance is uncertain. The finding does not appear to be related to pulmonary edema. Atypical pneumonia is a consideration. Chronic interstitial lung disease is another consideration, as would be lymphangitic metastasis.  Evidence of lytic lesion involving the lateral right sixth rib, metastasis not excluded.   Electronically Signed   By: Skipper Cliche M.D.   On: 08/20/2013 16:40   Ct Angio Chest Pe W/cm &/or Wo Cm  08/20/2013   CLINICAL DATA:  Severe shortness of breath and known blood clots, being treated  with Lovenox, history of lung cancer with chemotherapy and radiation therapy completed, chest pain with burning sensation  EXAM: CT ANGIOGRAPHY CHEST WITH CONTRAST  TECHNIQUE: Multidetector CT imaging of the chest was performed using the standard protocol during bolus administration of intravenous contrast. Multiplanar CT image reconstructions and MIPs were obtained to evaluate the vascular anatomy.  CONTRAST:  122mL OMNIPAQUE IOHEXOL 350 MG/ML SOLN  COMPARISON:  DG CHEST 2 VIEW dated 08/20/2013; CT CHEST W/CM dated 08/03/2013  FINDINGS: Again identified is pulmonary embolism involving right middle lobe and right lower lobe branches as well as left lower lobe and lingular branches. Clot burden is moderate but slightly decreased when compared to prior study. RV to LV ratio is again 0.77 with no evidence of right heart strain.  There is a moderate pericardial effusion measuring up to about 28 mm. Right paratracheal adenopathy stable.  Chronic right rib deformities stable. Diffuse emphysematous change stable. Postsurgical change right perihilar area, unchanged from prior  examination. On the left side, there is an irregular lower lobe mass measuring 14 x 11 mm. It is increased in degree of consolidation and size when compared to recent prior study and is consistent with metastasis. In the left midlung zone there is an irregular opacity measuring 19 x 16 mm, not significantly different from the prior study. There is mild increase in the degree of diffuse interstitial prominence in the left central lung region. There is a tiny left pleural effusion, similar to the prior study. Pericardial effusion has mildly increased when compared to the prior study.  No significant acute abnormality involving the thoracic aorta. Scans through the upper abdomen are unremarkable.  Review of the MIP images confirms the above findings.  IMPRESSION: Slightly decreased clot burden when compared to prior study.  Mild increase in the size of the pericardial effusion.  Stable nodule in the lingula concerning for metastasis. Enlarging left lower lobe irregular nodule concerning for metastasis. 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   Electronically Signed   By: Skipper Cliche M.D.   On: 08/20/2013 17:38    Scheduled Meds: . [START ON 08/22/2013] aspirin EC  81 mg Oral Daily  . atorvastatin  40 mg Oral q1800  . isosorbide mononitrate  60 mg Oral q morning - 10a  . metoprolol tartrate  25 mg Oral BID  . pantoprazole  40 mg Oral BID  . pantoprazole (PROTONIX) IV  40 mg Intravenous Q12H        Time spent: 40 minute   [facility] Pager [phone]. If 7PM-7AM, please contact night-coverage at [email or web address], password [facility] 08/21/2013, 11:34 AM  LOS: 1 day

## 2013-08-21 NOTE — Progress Notes (Signed)
   Dr. Elijio Miles note from earlier this am reviewed. Patient seen a& examined.   C/o esophagus pain this am but no further angina.   For cath tomorrow. On heparin.   Cath orders written.  Shaune Pascal Makael Stein,MD 9:23 AM

## 2013-08-21 NOTE — Progress Notes (Signed)
ANTICOAGULATION CONSULT NOTE - Follow Up Consult  Pharmacy Consult for Heparin Indication: pulmonary embolus  Allergies  Allergen Reactions  . Azithromycin     Reaction while in hospital - pt doesn't know reaction  . Erythromycin Other (See Comments)    Reaction while in hospital - pt doesn't know reaction  . Fentanyl Other (See Comments)    Delirium and violent  . Penicillins Other (See Comments)    Childhood reaction - unknown    Patient Measurements: Height: 5\' 10"  (177.8 cm) Weight: 192 lb 10.9 oz (87.4 kg) IBW/kg (Calculated) : 73 Heparin Dosing Weight:   Vital Signs: Temp: 97.6 F (36.4 C) (04/12 0000) Temp src: Oral (04/12 0000) BP: 142/105 mmHg (04/12 0200) Pulse Rate: 84 (04/12 0210)  Labs:  Recent Labs  08/20/13 1545 08/20/13 1800 08/21/13 0025 08/21/13 0200 08/21/13 0204  HGB 13.8  --   --   --  12.3*  HCT 41.1  --   --   --  37.2*  PLT 176  --   --   --  158  HEPARINUNFRC  --   --   --  0.59  --   CREATININE 1.31  --   --   --  1.18  TROPONINI <0.30 0.45* 8.47*  --   --     Estimated Creatinine Clearance: 71.3 ml/min (by C-G formula based on Cr of 1.18).   Medical History: Past Medical History  Diagnosis Date  . CAD (coronary artery disease)     a. PCI 3/08 to OM2 and mid CFX; b.  NSTEMI 6/10:  Prior stents patent, 90% dCFX,  mRCA occl (old) with collats; EF 55% on LV-gram => Xience DES 2.5 x 23 mm to distal CFX;  c. abnl MV 12/13 => LHC 04/23/12:  pLAD 50%, oOM1 80%, long stented segment of the CFX extending into the PLOM with the PLOM totally occluded ostially, CFX 70% ISR, mCFX 80% ISR, pRCA chronically occluded. Med Rx planned  . Diabetes mellitus type II   . Hyperlipidemia   . COPD (chronic obstructive pulmonary disease)     Quit tobacco 09/2009; Gold Stage 2 with asthma component - FEV1 1.53 L/54%, 14% fev1 BD response, DLCO 54% - July 2011; MM genotype 01/07/10; unable to afford spiriva and unwilling to try ics due to prior renal failure:  state to Dr. [name], Aug 2011; started on atrovent HFA fall 2011. no desturation on walk test Dec 2011  . CKD (chronic kidney disease)   . Obesity   . History of CVA (cerebrovascular accident) 2007    Without residual deficits  . 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 no further rx  . CHF (congestive heart failure)   . Hypertension   . Asthma     as child  . Blood transfusion without reported diagnosis 2015  . Radiation 03/16/13-04/28/13    Left lung 60Gy  . Pneumonia     02/14/2013 in hospital  . History of chemotherapy finished dec 2014  . Complication of anesthesia 05-04-14 woke up during esophagus stretching  . History of home oxygen therapy     uses oxygen 2 liters per [place] prn  . Myocardial infarction     x3  .  Stroke     7 years- no problems   . Shortness of breath     constant/wears 2 liters prn  . Pulmonary embolism 08-03-2013    bilateral  . Cancer     lung cancer-02/14/2013    Medications:  Infusions:  . sodium chloride 75 mL/hr at 08/20/13 2142  . heparin 1,450 Units/hr (08/20/13 1947)    Assessment: Patient on lovenox for PE PTA.  Last dose >24hr ago with q24hr dosing.    1st heparin level = 0.59 (therapeutic) with heparin infusing @ 1450 units/hr  No bleeding complications noted  Pt with noted + troponins and plans to transfer to [facility]  Goal of Therapy:  Heparin level 0.3-0.7 units/ml Monitor platelets by anticoagulation protocol: Yes   Plan:  Continue Heparin drip at 1450 units/hr Will recheck heparin level with troponin that is scheduled for 9:10AM today Daily heparin level and CBC  Shizuo Biskup Johny Shock, PharmD 08/21/2013,2:48 AM

## 2013-08-21 NOTE — Progress Notes (Signed)
ANTICOAGULATION CONSULT NOTE - Follow Up Consult  Pharmacy Consult for heparin Indication: NSTEMI  Labs:  Recent Labs  08/20/13 1545 08/20/13 1800 08/21/13 0025  08/21/13 0204 08/21/13 0935 08/21/13 1545 08/21/13 2245  HGB 13.8  --   --   --  12.3*  --   --   --   HCT 41.1  --   --   --  37.2*  --   --   --   PLT 176  --   --   --  158  --   --   --   HEPARINUNFRC  --   --   --   < >  --  0.10* 0.27* 0.54  CREATININE 1.31  --   --   --  1.18  --   --   --   TROPONINI <0.30 0.45* 8.47*  --   --   --   --   --   < > = values in this interval not displayed.   Assessment/Plan:  58yo male now therapeutic on heparin after rate increase. Will continue gtt at current rate and confirm stable with am labs.   Wynona Neat, PharmD, BCPS  08/21/2013,11:57 PM

## 2013-08-21 NOTE — Progress Notes (Signed)
CRITICAL VALUE ALERT  Critical value received:  Troponin 8.47  Date of notification:  08/21/2013  Time of notification:  0112  Critical value read back yes  Nurse who received alert:  Perry Mount RN  MD notified (1st page):  Fredirick Maudlin NP  Time of first page:  0117  MD notified (2nd page):Rai  Time of second page:none  Responding MD:  Tana Coast  Time MD responded:  [phone]

## 2013-08-21 NOTE — Progress Notes (Signed)
During the evening, pt has denied chest pain, but complained of familiar "esophageal pain" between 5-8/10.  Pain has been relieved with dilaudid and morphine. Tried to encourage pt to take a nitroglycerin tablet and he refused said that this was a pain he was used to Positive troponins have been reported to MDs. Pt is being transported via [facility] to [place] RN has received report.

## 2013-08-21 NOTE — H&P (Signed)
Cardiology Consultation Note  Patient ID: [name], MRN: [id number], DOB/AGE: 58-Sep-1957 58 y.o. Admit date: 08/20/2013   Date of Consult: 08/21/2013 Primary Physician: [name], MD Primary Cardiologist: Aundra Dubin. [place]   Chief Complaint: chest pain  Reason for Consult: NST-ACS  58 yo with h/o HTN, DM, hyperlipidemia, radiation esophagitis, PE, COPD, Afib , lung ca  p/w chest pain and being seen for NST-ACS  HPI ; pt states that for earlier in the day he started experiencing his typical radiation esophagitis symptoms of substernal heart burn , radiating to the sides of his chest and back that was more severe and long lasting which is why he came into the ER at [facility]. Over there he received  Nito patch and IV dilaudid  and his pain resolved. His EKG was unremarkable for any acute changes . However Troponin increased from 0.45--> 8.47. He was started on heparin gtt and transferred here for further management .  Pt denies any current chest pain , SOB , orthopnea, PND , LE edema , Syncope ,claudcation , focal weakness, or bleeding diathesis . Reports medication compliance .   Past Medical History  Diagnosis Date  . CAD (coronary artery disease)     a. PCI 3/08 to OM2 and mid CFX; b.  NSTEMI 6/10:  Prior stents patent, 90% dCFX,  mRCA occl (old) with collats; EF 55% on LV-gram => Xience DES 2.5 x 23 mm to distal CFX;  c. abnl MV 12/13 => LHC 04/23/12:  pLAD 50%, oOM1 80%, long stented segment of the CFX extending into the PLOM with the PLOM totally occluded ostially, CFX 70% ISR, mCFX 80% ISR, pRCA chronically occluded. Med Rx planned  . Diabetes mellitus type II   . Hyperlipidemia   . COPD (chronic obstructive pulmonary disease)     Quit tobacco 09/2009; Gold Stage 2 with asthma component - FEV1 1.53 L/54%, 14% fev1 BD response, DLCO 54% - July 2011; MM genotype 01/07/10; unable to afford spiriva and unwilling to try ics due to prior renal failure: state to Dr. [name], Aug  2011; started on atrovent HFA fall 2011. no desturation on walk test Dec 2011  . CKD (chronic kidney disease)   . Obesity   . History of CVA (cerebrovascular accident) 2007    Without residual deficits  . 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 no further rx  . CHF (congestive heart failure)   . Hypertension   . Asthma     as child  . Blood transfusion without reported diagnosis 2015  . Radiation 03/16/13-04/28/13    Left lung 60Gy  . Pneumonia     02/14/2013 in hospital  . History of chemotherapy finished dec 2014  . Complication of anesthesia 05-04-14 woke up during esophagus stretching  . History of home oxygen therapy     uses oxygen 2 liters per [place] prn  . Myocardial infarction     x3  . Stroke     7 years- no problems   . Shortness of breath     constant/wears 2 liters prn  . Pulmonary embolism 08-03-2013    bilateral  . Cancer     lung cancer-02/14/2013  Most Recent Cardiac Studies: See below for cath and EKG    Surgical History:  Past Surgical History  Procedure Laterality Date  . Wedge resection  03/28/2010  . Coronary stent placement  2009    x 2  . Cardiac catheterization  2009  . Endobronchial ultrasound Bilateral 02/28/2013    Procedure: ENDOBRONCHIAL ULTRASOUND;  Surgeon: Brand Males, MD;  Location: [facility] ENDOSCOPY;  Service: Cardiopulmonary;  Laterality: Bilateral;  . Esophagogastroduodenoscopy N/A 05/04/2013    Procedure: ESOPHAGOGASTRODUODENOSCOPY (EGD);  Surgeon: Inda Castle, MD;  Location: [facility];  Service: Endoscopy;  Laterality: N/A;  . Esophagogastroduodenoscopy N/A 07/06/2013    Procedure: ESOPHAGOGASTRODUODENOSCOPY (EGD);  Surgeon: Inda Castle, MD;  Location: [facility];  Service: Endoscopy;  Laterality: N/A;  . Savory dilation N/A 07/06/2013    Procedure: SAVORY DILATION;  Surgeon: Inda Castle, MD;  Location: [facility];  Service: Endoscopy;  Laterality: N/A;  . Esophagogastroduodenoscopy N/A 07/14/2013    Procedure: ESOPHAGOGASTRODUODENOSCOPY (EGD);  Surgeon: Inda Castle, MD;  Location: [facility];  Service: Endoscopy;  Laterality: N/A;  . Savory dilation N/A 07/14/2013    Procedure: SAVORY DILATION;  Surgeon: Inda Castle, MD;  Location: [facility];  Service: Endoscopy;  Laterality: N/A;  . Esophagogastroduodenoscopy N/A 07/26/2013    Procedure: ESOPHAGOGASTRODUODENOSCOPY (EGD);  Surgeon: Inda Castle, MD;  Location: [facility];  Service: Endoscopy;  Laterality: N/A;  . Savory dilation N/A 07/26/2013    Procedure: SAVORY DILATION;  Surgeon: Inda Castle, MD;  Location: [facility];  Service: Endoscopy;  Laterality: N/A;     Home Meds: Prior to Admission medications   Medication Sig Start Date End Date Taking? Authorizing Provider  albuterol (PROVENTIL HFA;VENTOLIN HFA) 108 (90 BASE) MCG/ACT inhaler Inhale into the lungs every 6 (six) hours as needed for wheezing or shortness of breath.   Yes Historical Provider, MD  albuterol (PROVENTIL) (2.5 MG/3ML) 0.083% nebulizer solution Take 2.5 mg by nebulization every 6 (six) hours as needed for wheezing or shortness of breath.   Yes Historical Provider, MD  Alum & Mag Hydroxide-Simeth (MAGIC MOUTHWASH W/LIDOCAINE) SOLN Take 5 mLs by mouth 4 (four) times daily as needed for mouth pain.   Yes Historical Provider, MD  atorvastatin (LIPITOR) 40 MG tablet Take 40 mg by mouth daily.    Yes Historical Provider, MD  enoxaparin (LOVENOX) 150 MG/ML injection Inject 0.93 mLs (140 mg total) into the skin daily. 08/04/13  Yes Janece Canterbury, MD  HYDROcodone-acetaminophen (HYCET) 7.5-325 mg/15 ml solution Take 10 mLs by mouth 4 (four) times daily as needed for moderate pain.  08/18/13  Yes Thea Silversmith, MD  isosorbide mononitrate (IMDUR) 60 MG 24 hr tablet Take 60 mg by mouth every morning.   Yes Historical Provider, MD  metoprolol tartrate (LOPRESSOR) 25 MG tablet Take 25 mg by mouth 2 (two) times daily.   Yes Historical Provider, MD  nitroGLYCERIN (NITROSTAT) 0.4 MG SL tablet Place 0.4 mg under the tongue every 5 (five) minutes as needed for chest pain. May repeat up to 3 doses   Yes Historical Provider, MD  pantoprazole (PROTONIX) 40 MG tablet Take 40 mg by mouth 2 (two) times daily.   Yes Historical Provider, MD    Inpatient Medications:  . atorvastatin  40 mg Oral q1800  . isosorbide mononitrate  60 mg Oral q morning - 10a  . metoprolol tartrate  25 mg Oral BID  . pantoprazole (PROTONIX) IV  40 mg Intravenous Q12H   . sodium chloride 75 mL/hr at  08/20/13 2142  . heparin 1,450 Units/hr (08/20/13 1947)    Allergies:  Allergies  Allergen Reactions  . Azithromycin     Reaction while in hospital - pt doesn't know reaction  . Erythromycin Other (See Comments)    Reaction while in hospital - pt doesn't know reaction  . Fentanyl Other (See Comments)    Delirium and violent  . Penicillins Other (See Comments)    Childhood reaction - unknown    History   Social History  . Marital Status: Single    Spouse Name: N/A    Number of Children: 0  . Years of Education: N/A   Occupational History  . Corporate treasurer - sports wear designer [employer]    June 2011/Disability   Social History Main Topics  . Smoking status: Former Smoker -- 1.00 packs/day for 40 years    Types: Cigarettes    Quit date: 09/09/2008  . Smokeless tobacco: Never Used  . Alcohol Use: No     Comment: rarely  . Drug Use: No  . Sexual Activity: No   Other Topics Concern  . Not on file   Social History Narrative   Single, lives with a friend upstairs.  Has home oxygen. Does not use cane or walker.  Drove until esophagus dilations, so not recently.                    Family History  Problem Relation Age of Onset  . Stroke Other   . Diabetes Mother   . Cancer Maternal Uncle     esophagus  . Cancer Maternal Grandfather     esophagus  . Heart disease Mother   . Deep vein thrombosis Sister      Review of Systems: General: negative for chills, fever, night sweats or weight changes.  Cardiovascular: see HPI  Dermatological: negative for rash Respiratory: negative for cough or wheezing Urologic: negative for hematuria Abdominal: negative for nausea, vomiting, diarrhea, bright red blood per rectum, melena, or hematemesis Neurologic: negative for visual changes, syncope, or dizziness All other systems reviewed and are otherwise negative except as noted above.  Labs:  Recent Labs  08/20/13 1545 08/20/13 1800 08/21/13 0025  TROPONINI <0.30 0.45* 8.47*   Lab Results  Component Value Date   WBC 6.3 08/20/2013   HGB 13.8 08/20/2013   HCT 41.1 08/20/2013   MCV 93.6 08/20/2013   PLT 176 08/20/2013    Recent Labs Lab 08/20/13 1545  NA 136*  K 4.3  CL 96  CO2 25  BUN 20  CREATININE 1.31  CALCIUM 9.7  GLUCOSE 230*   Lab Results  Component Value Date   CHOL 123 11/11/2012   HDL 26.40* 11/11/2012   LDLCALC 72 11/11/2012   TRIG 125.0 11/11/2012   No results found for this basename: DDIMER   Trop i 0.45--> 8.47   Radiology/Studies:  Dg Chest 2 View  08/20/2013   CLINICAL DATA:  Chest pain and shortness of breath for 1 day  EXAM: CHEST  2 VIEW  COMPARISON:  CT CHEST W/CM dated 08/03/2013; DG CHEST 1V PORT dated 04/29/2013  FINDINGS: Mild cardiac enlargement is stable. Vascular pattern is normal. There is an irregular 1.5 cm opacity laterally in the right lower lobe that was not present previously. There is mild diffuse interstitial change bilaterally, mildly worse on the left. There is and mild increased in severity of this process, some of which if not all may be related to technique. The findings  do not appear consistent with pulmonary edema.  There are no pleural effusions. There is a 1 cm oval lytic area in the lateral right sixth rib.  IMPRESSION: Irregular right lower lobe opacity concerning for pulmonary nodule. CT thorax recommended to further evaluate.  Mild diffuse interstitial change left lung greater than right. Significance is uncertain. The finding does not appear to be related to pulmonary edema. Atypical pneumonia is a consideration. Chronic interstitial lung disease is another consideration, as would be lymphangitic metastasis.  Evidence of lytic lesion involving the lateral right sixth rib, metastasis not excluded.   Electronically Signed   By: Skipper Cliche M.D.   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 Restaging. Subsequent encounter.  EXAM: CT HEAD WITHOUT AND WITH CONTRAST  TECHNIQUE: Contiguous axial images were obtained from the base of the skull through the vertex without and with intravenous contrast  CONTRAST:  158mL OMNIPAQUE IOHEXOL 300 MG/ML SOLN in conjunction with contrast enhanced imaging of the chest reported separately.  COMPARISON:  Brain MRI 03/14/2013.  FINDINGS: Visible paranasal sinuses and mastoids are stable and clear. Negative visualized osseous structures. Negative scalp and orbits soft tissues.  Stable cerebral volume. No midline shift, ventriculomegaly, mass effect, evidence of mass lesion, intracranial hemorrhage or evidence of cortically based acute infarction. Gray-white matter differentiation is within normal limits throughout the brain. No abnormal enhancement identified. Major intracranial vascular structures are enhancing.  IMPRESSION: 1. Stable and normal CT appearance of the brain. 2. Chest CT from today reported separately.   Electronically Signed   By: Lars Pinks M.D.   On: 08/03/2013 13:47   Ct Chest W Contrast  08/03/2013   CLINICAL DATA:  Small cell lung cancer diagnosed 02/2013, chemotherapy and XRT  complete, shortness of breath, dysphagia, weight loss.  EXAM: CT CHEST WITH CONTRAST  TECHNIQUE: Multidetector CT imaging of the chest was performed during intravenous contrast administration.  CONTRAST:  159mL OMNIPAQUE IOHEXOL 300 MG/ML  SOLN  COMPARISON:  CT chest dated 05/02/2013  FINDINGS: Although study was not tailored for optimal evaluation of the pulmonary arteries, there are bilateral pulmonary emboli involving the right middle lobe and bilateral lower lobe pulmonary arteries, as well as the lingular branch. Overall clot burden is moderate. No findings to suggest right heart strain, with an RV-to-LV ratio of 0.77.  Postsurgical changes related to prior right lung wedge resection. Radiation changes in the superior segment left lower lobe.  New nodularity in the lingula measuring up to 1.7 x 1.5 cm (series 11/ image 21). Additional 11 mm irregular nodular opacity in the left lower lobe (series 11/ image 49), new).  Moderate to severe centrilobular and paraseptal emphysematous changes. Mild scarring in the anterolateral right lung base (series 11/ image 40), unchanged. No pleural effusion or pneumothorax.  Visualized thyroid is unremarkable.  Heart is normal in size. Small pericardial effusion, new/increased. Atherosclerotic calcifications of the aortic arch. Coronary atherosclerosis.  1.4 cm short axis right paratracheal node (series 5/ image 10), previously 1.2 cm.  Wall thickening/edema involving the mid esophagus (series 5/image 27), unchanged.  Visualized upper abdomen is unremarkable.  Degenerative changes of the visualized thoracolumbar spine. Multiple right rib defects related to prior right thoracotomy.  IMPRESSION: Bilateral pulmonary emboli, as described above. Overall clot burden is moderate.  Prior right lung wedge resection with radiation changes in the superior segment left lower lobe.  New nodularity in the  lingula and left lower lobe, measuring up to 1.7 cm. While nonspecific, possibly post  infectious/inflammatory and/or related to radiation changes, metastatic disease is not excluded. Attention on follow-up is suggested.  Mildly progressive right paratracheal lymphadenopathy measuring up to 1.4 cm, worrisome for nodal recurrence.  Critical value/emergent results were called by telephone at the time of interpretation on 08/03/2013 at 10:50 AM to Dr. Curt Bears , who verbally acknowledged these results. At his request, the patient was notified of the results and sent to the ED.   Electronically Signed   By: Julian Hy M.D.   On: 08/03/2013 11:07   Ct Angio Chest Pe W/cm &/or Wo Cm  08/20/2013   CLINICAL DATA:  Severe shortness of breath and known blood clots, being treated with Lovenox, history of lung cancer with chemotherapy and radiation therapy completed, chest pain with burning sensation  EXAM: CT ANGIOGRAPHY CHEST WITH CONTRAST  TECHNIQUE: Multidetector CT imaging of the chest was performed using the standard protocol during bolus administration of intravenous contrast. Multiplanar CT image reconstructions and MIPs were obtained to evaluate the vascular anatomy.  CONTRAST:  172mL OMNIPAQUE IOHEXOL 350 MG/ML SOLN  COMPARISON:  DG CHEST 2 VIEW dated 08/20/2013; CT CHEST W/CM dated 08/03/2013  FINDINGS: Again identified is pulmonary embolism involving right middle lobe and right lower lobe branches as well as left lower lobe and lingular branches. Clot burden is moderate but slightly decreased when compared to prior study. RV to LV ratio is again 0.77 with no evidence of right heart strain.  There is a moderate pericardial effusion measuring up to about 28 mm. Right paratracheal adenopathy stable.  Chronic right rib deformities stable. Diffuse emphysematous change stable. Postsurgical change right perihilar area, unchanged from prior examination. On the left side, there is an irregular lower lobe mass measuring 14 x 11 mm. It is increased in degree of consolidation and size when compared  to recent prior study and is consistent with metastasis. In the left midlung zone there is an irregular opacity measuring 19 x 16 mm, not significantly different from the prior study. There is mild increase in the degree of diffuse interstitial prominence in the left central lung region. There is a tiny left pleural effusion, similar to the prior study. Pericardial effusion has mildly increased when compared to the prior study.  No significant acute abnormality involving the thoracic aorta. Scans through the upper abdomen are unremarkable.  Review of the MIP images confirms the above findings.  IMPRESSION: Slightly decreased clot burden when compared to prior study.  Mild increase in the size of the pericardial effusion.  Stable nodule in the lingula concerning for metastasis. Enlarging left lower lobe irregular nodule concerning for metastasis. 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   Electronically Signed   By: Skipper Cliche M.D.   On: 08/20/2013 17:38   Dg Esophagus Dilatation  07/26/2013   CLINICAL DATA: esophageal stricture   ESOPHAGEAL DILATATION  Fluoroscopy was provided for use by the requesting physician.  No images  were obtained for radiographic interpretation.     EKG: NSR, QRS widening , low voltage   Physical Exam: Blood pressure 130/96, pulse 80, temperature 97.6 F (36.4 C), temperature source Oral, resp. rate 13, height 5\' 10"  (1.778 m), weight 87.4 kg (192 lb 10.9 oz), SpO2 95.00%. General: Well developed, well nourished, in no acute distress. Head: Normocephalic, atraumatic, sclera non-icteric, no xanthomas, nares are without discharge.  Neck: Negative for carotid bruits. JVD not elevated. Lungs: Clear  bilaterally to auscultation without wheezes, rales, or rhonchi. Breathing is unlabored. Heart: RRR with S1 S2. No murmurs, rubs, or gallops appreciated. Abdomen: Soft, non-tender, non-distended with normoactive bowel sounds. No  hepatomegaly. No rebound/guarding. No obvious abdominal masses. Msk:  Strength and tone appear normal for age. Extremities: No clubbing or cyanosis. No edema.  Distal pedal pulses are 2+ and equal bilaterally. Neuro: Alert and oriented X 3. No facial asymmetry. No focal deficit. Moves all extremities spontaneously. Psych:  Responds to questions appropriately with a normal affect.     Assessment and Plan: NSTEMI  Pericardial Effusion  Lung Cancer  Hx of PE   Plan  admitt on tele,  Monitor CE  Check Echo to see size of pericardial effusion , pt hemodynamically stable Cont aspirin , statin , b-blocker and heparin gtt  Likely LHC in am , however will take earlier incase of any persistent symptoms, hemodyamic instability or EKG changes     Signed, Grafton Folk M.D  08/21/2013, 1:55 AM

## 2013-08-21 NOTE — Progress Notes (Signed)
3rd set troponin elevated at 8.47 <- 0.45 <- 0.30  D/w Dr Frances Furbish, cardiology, will transfer to [facility] for cardiology evaluation, potential cardiac cath. Patient has a history of CAD s/p 3 MIs and multiple stents.  Currently on heparin drip, aspirin, beta blocker, Imdur, statin Discussed with patient's RN, patient had off-and-on chest pain 5/10 after transfer to room from the ER which did improve with morphine. Currently chest pain improved.  Discussed with Ms Donnal Debar for the sign out at [facility], please notify cardiology when the patient arrives.   Ripudeep Krystal Eaton M.D. [facility] 08/21/2013, 1:27 AM  Pager: [phone]

## 2013-08-22 DIAGNOSIS — I3139 Other pericardial effusion (noninflammatory): Secondary | ICD-10-CM | POA: Diagnosis present

## 2013-08-22 DIAGNOSIS — R079 Chest pain, unspecified: Secondary | ICD-10-CM

## 2013-08-22 DIAGNOSIS — I313 Pericardial effusion (noninflammatory): Secondary | ICD-10-CM | POA: Diagnosis present

## 2013-08-22 DIAGNOSIS — I319 Disease of pericardium, unspecified: Secondary | ICD-10-CM

## 2013-08-22 LAB — LIPID PANEL
CHOL/HDL RATIO: 3.1 ratio
Cholesterol: 133 mg/dL (ref 0–200)
HDL: 43 mg/dL (ref >39)
LDL Cholesterol: 69 mg/dL (ref 0–99)
Triglycerides: 105 mg/dL (ref <150)
VLDL: 21 mg/dL (ref 0–40)

## 2013-08-22 LAB — GLUCOSE, CAPILLARY
GLUCOSE-CAPILLARY: 151 mg/dL — AB (ref 70–99)
Glucose-Capillary: 104 mg/dL — ABNORMAL HIGH (ref 70–99)

## 2013-08-22 LAB — CBC
HEMATOCRIT: 36.2 % — AB (ref 39.0–52.0)
Hemoglobin: 12.2 g/dL — ABNORMAL LOW (ref 13.0–17.0)
MCH: 31.9 pg (ref 26.0–34.0)
MCHC: 33.7 g/dL (ref 30.0–36.0)
MCV: 94.8 fL (ref 78.0–100.0)
Platelets: 153 10*3/uL (ref 150–400)
RBC: 3.82 MIL/uL — AB (ref 4.22–5.81)
RDW: 15 % (ref 11.5–15.5)
WBC: 6.5 10*3/uL (ref 4.0–10.5)

## 2013-08-22 LAB — HEPARIN LEVEL (UNFRACTIONATED): Heparin Unfractionated: 0.45 IU/mL (ref 0.30–0.70)

## 2013-08-22 LAB — TROPONIN I
TROPONIN I: 5.69 ng/mL — AB (ref <0.30)
Troponin I: 4.5 ng/mL (ref <0.30)

## 2013-08-22 MED ORDER — PANTOPRAZOLE SODIUM 40 MG PO TBEC: 40.0000 mg | DELAYED_RELEASE_TABLET | Freq: Two times a day (BID) | ORAL | Status: DC

## 2013-08-22 MED ORDER — DILTIAZEM LOAD VIA INFUSION
10.0000 mg | Freq: Once | INTRAVENOUS | Status: AC
  Administered 2013-08-22: 10 mg via INTRAVENOUS
  Filled 2013-08-22: qty 10

## 2013-08-22 MED ORDER — DILTIAZEM HCL 100 MG IV SOLR
5.0000 mg/h | INTRAVENOUS | Status: DC
  Administered 2013-08-22: 5 mg/h via INTRAVENOUS
  Filled 2013-08-22: qty 100

## 2013-08-22 MED ORDER — CHLORHEXIDINE GLUCONATE CLOTH 2 % EX PADS
6.0000 | MEDICATED_PAD | Freq: Every day | CUTANEOUS | Status: DC
  Administered 2013-08-22 – 2013-08-25 (×4): 6 via TOPICAL

## 2013-08-22 MED ORDER — INSULIN ASPART 100 UNIT/ML ~~LOC~~ SOLN: 0.0000 [IU] | Freq: Every day | SUBCUTANEOUS | Status: DC

## 2013-08-22 MED ORDER — PANTOPRAZOLE SODIUM 40 MG IV SOLR
40.0000 mg | Freq: Two times a day (BID) | INTRAVENOUS | Status: DC
  Administered 2013-08-22 – 2013-08-23 (×2): 40 mg via INTRAVENOUS
  Filled 2013-08-22 (×2): qty 40

## 2013-08-22 MED ORDER — INSULIN ASPART 100 UNIT/ML ~~LOC~~ SOLN
0.0000 [IU] | Freq: Three times a day (TID) | SUBCUTANEOUS | Status: DC
  Administered 2013-08-22: 2 [IU] via SUBCUTANEOUS
  Administered 2013-08-23: 3 [IU] via SUBCUTANEOUS

## 2013-08-22 MED ORDER — SODIUM CHLORIDE 0.9 % IV SOLN: 1.0000 mL/kg/h | INTRAVENOUS | Status: DC

## 2013-08-22 MED ORDER — ASPIRIN 81 MG PO CHEW
81.0000 mg | CHEWABLE_TABLET | ORAL | Status: AC
  Administered 2013-08-23: 81 mg via ORAL
  Filled 2013-08-22: qty 1

## 2013-08-22 MED ORDER — MUPIROCIN 2 % EX OINT
1.0000 "application " | TOPICAL_OINTMENT | Freq: Two times a day (BID) | CUTANEOUS | Status: DC
  Administered 2013-08-22 – 2013-08-25 (×7): 1 via NASAL
  Filled 2013-08-22: qty 22

## 2013-08-22 MED ORDER — METOPROLOL TARTRATE 1 MG/ML IV SOLN
2.5000 mg | Freq: Once | INTRAVENOUS | Status: AC
  Administered 2013-08-22: 2.5 mg via INTRAVENOUS
  Filled 2013-08-22: qty 5

## 2013-08-22 NOTE — Progress Notes (Signed)
INITIAL NUTRITION ASSESSMENT  DOCUMENTATION CODES Per approved criteria  -Non-severe (moderate) malnutrition in the context of chronic illness   INTERVENTION: 1.  Supplements; Ensure Complete po BID, each supplement provides 350 kcal and 13 grams of protein  NUTRITION DIAGNOSIS: Inadequate oral intake related to esophageal pain, trouble swallowing as evidenced by pt report.    Monitor:  1.  Food/Beverage; pt meeting >/=90% estimated needs with tolerance. 2.  Wt/wt change; monitor trends  Reason for Assessment: MST  58 y.o. male  Admitting Dx: Atypical chest pain  ASSESSMENT: Pt admitted with chest pain.   Pt recently followed by outpt RD at [facility] while undergoing treatment for lung cancer.  Pt also c/o trouble swallowing due to esophageal strictures.  He has had recent wt loss from 220 lbs to 192 lbs.  He has lost 6 lbs over the past 1 month, 28 lbs total over the past 6 months (12% of usual weight) which is clinically significant.  RD met with pt who states he is adequately managing his nutrition at home.  He believes his weight is stable and he is meeting his nutrition needs with soft foods, soup, and occasional Ensure.  Pt is willing to try [employer] as an alternative to Ensure. RD to order.   Nutrition Focused Physical Exam: Subcutaneous Fat:  Orbital Region: WNL Upper Arm Region: WNL Thoracic and Lumbar Region: mild wasting  Muscle:  Temple Region: mild wasting Clavicle Bone Region: mild-moderate wasting Clavicle and Acromion Bone Region: WNL Scapular Bone Region: WNL Dorsal Hand: WNL Patellar Region: not assessed Anterior Thigh Region: not assessed Posterior Calf Region: not assessed  Edema: none present   Pt meets criteria for non-severe MALNUTRITION in the context of chronic illness as evidenced by mild wasting identified on physical exam as well as 12% wt loss in ~6 months.  Height: Ht Readings from Last 1 Encounters:  08/20/13 5' 10"  (1.778 m)    Weight: Wt Readings from Last 1 Encounters:  08/20/13 192 lb 10.9 oz (87.4 kg)    Ideal Body Weight: 166 lbs  % Ideal Body Weight: 115%  Wt Readings from Last 10 Encounters:  08/20/13 192 lb 10.9 oz (87.4 kg)  08/20/13 192 lb 10.9 oz (87.4 kg)  08/18/13 193 lb 1.6 oz (87.59 kg)  08/12/13 190 lb 4.8 oz (86.32 kg)  08/04/13 198 lb (89.812 kg)  07/26/13 198 lb (89.812 kg)  07/26/13 198 lb (89.812 kg)  07/15/13 198 lb 3.2 oz (89.903 kg)  07/14/13 195 lb (88.451 kg)  07/14/13 195 lb (88.451 kg)    Usual Body Weight: 220 lbs  % Usual Body Weight: 87%  BMI:  Body mass index is 27.65 kg/(m^2).  Estimated Nutritional Needs: Kcal: 2100-2300 Protein: 105-120g Fluid: >2.1 L/day  Skin: intact  Diet Order: NPO  EDUCATION NEEDS: -Education needs addressed   Intake/Output Summary (Last 24 hours) at 08/22/13 1241 Last data filed at 08/22/13 1200  Gross per 24 hour  Intake 1819.81 ml  Output   2575 ml  Net -755.19 ml    Last BM: PTA  Labs:   Recent Labs Lab 08/20/13 1545 08/21/13 0204  NA 136* 136*  K 4.3 4.8  CL 96 100  CO2 25 26  BUN 20 19  CREATININE 1.31 1.18  CALCIUM 9.7 9.1  GLUCOSE 230* 136*    CBG (last 3)  No results found for this basename: GLUCAP,  in the last 72 hours  Scheduled Meds: . aspirin EC  81 mg Oral  Daily  . atorvastatin  40 mg Oral q1800  . Chlorhexidine Gluconate Cloth  6 each Topical Q0600  . isosorbide mononitrate  60 mg Oral q morning - 10a  . metoprolol tartrate  25 mg Oral BID  . mupirocin ointment  1 application Nasal BID  . pantoprazole (PROTONIX) IV  40 mg Intravenous Q12H  . sodium chloride  3 mL Intravenous Q12H    Continuous Infusions: . sodium chloride 1 mL/kg/hr (08/22/13 0439)  . diltiazem (CARDIZEM) infusion 5 mg/hr (08/22/13 0437)  . heparin 1,600 Units/hr (08/22/13 0252)  . nitroGLYCERIN Stopped (08/21/13 1900)    Past Medical History  Diagnosis Date  . CAD (coronary artery disease)      a. PCI 3/08 to OM2 and mid CFX; b.  NSTEMI 6/10:  Prior stents patent, 90% dCFX,  mRCA occl (old) with collats; EF 55% on LV-gram => Xience DES 2.5 x 23 mm to distal CFX;  c. abnl MV 12/13 => LHC 04/23/12:  pLAD 50%, oOM1 80%, long stented segment of the CFX extending into the PLOM with the PLOM totally occluded ostially, CFX 70% ISR, mCFX 80% ISR, pRCA chronically occluded. Med Rx planned  . Diabetes mellitus type II   . Hyperlipidemia   . COPD (chronic obstructive pulmonary disease)     Quit tobacco 09/2009; Gold Stage 2 with asthma component - FEV1 1.53 L/54%, 14% fev1 BD response, DLCO 54% - July 2011; MM genotype 01/07/10; unable to afford spiriva and unwilling to try ics due to prior renal failure: state to Dr. [name], Aug 2011; started on atrovent HFA fall 2011. no desturation on walk test Dec 2011  . CKD (chronic kidney disease)   . Obesity   . History of CVA (cerebrovascular accident) 2007    Without residual deficits  . 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 no further rx  . CHF (congestive heart failure)   . Hypertension   . Asthma     as child  . Blood transfusion without reported diagnosis 2015  . Radiation 03/16/13-04/28/13    Left lung 60Gy  . Pneumonia     02/14/2013 in hospital  . History of chemotherapy finished dec 2014  . Complication of anesthesia 05-04-14 woke up during esophagus stretching  . History of home oxygen therapy     uses oxygen 2 liters per [place] prn  . Myocardial infarction     x3  . Stroke     7 years- no problems   . Shortness of breath     constant/wears 2 liters prn  . Pulmonary embolism 08-03-2013    bilateral  . Cancer     lung cancer-02/14/2013     Past Surgical History  Procedure Laterality Date  . Wedge resection  03/28/2010  . Coronary stent placement  2009    x 2  . Cardiac catheterization  2009  . Endobronchial ultrasound Bilateral 02/28/2013    Procedure: ENDOBRONCHIAL ULTRASOUND;  Surgeon: Brand Males, MD;  Location: [facility];  Service: Cardiopulmonary;  Laterality: Bilateral;  . Esophagogastroduodenoscopy N/A 05/04/2013    Procedure: ESOPHAGOGASTRODUODENOSCOPY (EGD);  Surgeon: Inda Castle, MD;  Location: [facility];  Service: Endoscopy;  Laterality: N/A;  . Esophagogastroduodenoscopy N/A 07/06/2013  Procedure: ESOPHAGOGASTRODUODENOSCOPY (EGD);  Surgeon: Inda Castle, MD;  Location: [facility];  Service: Endoscopy;  Laterality: N/A;  . Savory dilation N/A 07/06/2013    Procedure: SAVORY DILATION;  Surgeon: Inda Castle, MD;  Location: [facility];  Service: Endoscopy;  Laterality: N/A;  . Esophagogastroduodenoscopy N/A 07/14/2013    Procedure: ESOPHAGOGASTRODUODENOSCOPY (EGD);  Surgeon: Inda Castle, MD;  Location: [facility];  Service: Endoscopy;  Laterality: N/A;  . Savory dilation N/A 07/14/2013    Procedure: SAVORY DILATION;  Surgeon: Inda Castle, MD;  Location: [facility];  Service: Endoscopy;  Laterality: N/A;  . Esophagogastroduodenoscopy N/A 07/26/2013    Procedure: ESOPHAGOGASTRODUODENOSCOPY (EGD);  Surgeon: Inda Castle, MD;  Location: [facility];  Service: Endoscopy;  Laterality: N/A;  . Savory dilation N/A 07/26/2013    Procedure: SAVORY DILATION;  Surgeon: Inda Castle, MD;  Location: [facility];  Service: Endoscopy;  Laterality: N/A;    Brynda Greathouse, MS RD LDN Clinical Inpatient Dietitian Pager: [phone] Weekend/After hours pager: [phone]

## 2013-08-22 NOTE — Progress Notes (Signed)
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 The patient described the chest pain as burning, sometimes radiating towards the neck, worsens intermittently. Patient reported that he had "heart attack" twice and this is not "his heart", no worsening chest pain with deep breathing or coughing.   Patient had recent echocardiogram done in March 2015 which showed EF 55-60% with grade 1 diastolic dysfunction. EKG did not show acute ST-T wave changes c/w ischemia, troponin x1 was negative.  Patient has a known history of radiation-induced esophagitis and moderate to severe esophageal stricture, has been having serial dilatations, currently scheduled to have another dilation, last done on 07/26/13 by Dr Deatra Ina   After admission his TNI bumped- initially subtly to 0.45 but 3rd reading bumped to 8.47. Cardiology was consulted and recommended tx to [facility] for possible catheterization.  HPI/Subjective: Denies CP. Says mouth and throat are very dry since has been NPO.  Assessment/Plan:    CAD/NSTEMI / chest pain -Cards following -have deferred cath to 4/14 -cont Heparin -repeat TNI in am- peak thus far >8    Atrial fibrillation with RVR -had brief run this admit and developed hypotension with Cardizem gtt -cont maintain NSR -suspect volume depletion in setting of severe esophagitis is cause-on IVF per pre cath orders -cont BB but may need to change to IV dosing given soft BPs    Pericardial effusion -Clarified by Cards as moderate sized -etiology could be either  metastatic or hemorrhagic -ECHO ordered    Acute on chronic respiratory failure with hypoxia:   A) Severe chronic obstructive pulmonary disease   B) Small cell carcinoma of lung   C) Chronic diastolic CHF (congestive heart failure), NYHA class 1 -intermittently on 2L at home -currently stable on that level of O2 -was scheduled to start prophylactic radiation rxn to brain 4/14 but now on hold -no evidenc of decompensated HF-actually seems dry    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 -last dilatation 07/26/13 -consider nutrition eval and possibly SLP for diet consistency    Diabetes mellitus, type 2 -begin SSI -diet controlled at home -HgbA1c 5.6-was 8.4 in December and likely reflective of porr intake from esophagitis    HYPERTENSION, UNSPECIFIED -BP soft    Protein-calorie malnutrition, severe -see above    Submassive pulmonary embolism -dx'd March 25,2015 -cont Heparin per pharmacy  DVT prophylaxis: IV heparin Code Status: Full Family Communication: No family at bedside Disposition Plan/Expected LOS: Step down  Consultants: Cardiology  Procedures: 2-D echocardiogram pending  Antibiotics: None  Objective: Blood pressure 93/64, pulse 89, temperature 97.8 F (36.6 C), temperature source Oral, resp. rate 13, height 5\' 10"  (1.778 m), weight 192 lb 10.9 oz (87.4 kg), SpO2 98.00%.  Intake/Output Summary (Last 24 hours) at 08/22/13 1425 Last data filed at 08/22/13 1400  Gross per 24 hour  Intake 1659.81 ml  Output   1975 ml  Net -315.19 ml   Exam: General: No acute respiratory distress Lungs: Clear to auscultation bilaterally without wheezes or crackles, 2 L Cardiovascular: Irregular rate and rhythm earlier this morning but  has subsequently converted to sinus rhythm/sinus tachycardia without murmur gallop or rub normal S1 and S2, no peripheral edema or JVD Abdomen: Nontender, nondistended, soft, bowel sounds positive, no rebound, no ascites,  no appreciable mass Musculoskeletal: No significant cyanosis, clubbing of bilateral lower extremities Neurological: Alert and oriented x 3, moves all extremities x 4 without focal neurological deficits, CN 2-12 intact  Scheduled Meds:  Scheduled Meds: . [START ON 08/23/2013] aspirin  81 mg Oral Pre-Cath  . aspirin EC  81 mg Oral Daily  . atorvastatin  40 mg Oral q1800  . Chlorhexidine Gluconate Cloth  6 each Topical Q0600  . isosorbide mononitrate  60 mg Oral q morning - 10a  . metoprolol tartrate  25 mg Oral BID  . mupirocin ointment  1 application Nasal BID  . pantoprazole (PROTONIX) IV  40 mg Intravenous Q12H  . sodium chloride  3 mL Intravenous Q12H   Data Reviewed: Basic Metabolic Panel:  Recent Labs Lab 08/20/13 1545 08/21/13 0204  NA 136* 136*  K 4.3 4.8  CL 96 100  CO2 25 26  GLUCOSE 230* 136*  BUN 20 19  CREATININE 1.31 1.18  CALCIUM 9.7 9.1   Liver Function Tests: No results found for this basename: AST, ALT, ALKPHOS, BILITOT, PROT, ALBUMIN,  in the last 168 hours  CBC:  Recent Labs Lab 08/20/13 1545 08/21/13 0204 08/22/13 0314  WBC 6.3 4.6 6.5  NEUTROABS 5.0  --   --   HGB 13.8 12.3* 12.2*  HCT 41.1 37.2* 36.2*  MCV 93.6 94.7 94.8  PLT 176 158 153   Cardiac Enzymes:  Recent Labs Lab 08/20/13 1545 08/20/13 1800 08/21/13 0025  TROPONINI <0.30 0.45* 8.47*   BNP (last 3 results)  Recent Labs  04/28/13 1430 08/03/13 1130 08/20/13 1811  PROBNP 678.8* 2537.0* 1986.0*    Recent Results (from the past 240 hour(s))  MRSA PCR SCREENING     Status: Abnormal   Collection Time    08/20/13  9:20 PM      Result Value Ref Range Status   MRSA by PCR POSITIVE (*) NEGATIVE Corrected   Comment: RESULT CALLED TO, READ BACK BY AND VERIFIED WITH:     SPOKE WITH REEVES,M RN [phone] [id number] COVINGTON,N     CORRECTED RESULTS CALLED TO:     REEVES,M RN [phone] COVINGTON,N     CORRECTED ON 04/12 AT 0429: PREVIOUSLY REPORTED AS POSITIVE        The GeneXpert  MRSA Assay (FDA approved for NASAL specimens only), is one component of a comprehensive MRSA colonization surveillance program. It is not intended to diagnose MRSA infection      nor to guide or monitor treatment for MRSA infections. INVALID RESULTS, SPECIMEN SENT FOR CULTURE SPOKE WITH REEVES,M RN [phone] COVINGTON,N     Studies:  Recent x-ray studies have been reviewed in detail by the Attending Physician       Erin Hearing, ANP [facility] Office  [phone] Pager [phone]  **If unable to reach the above provider after paging please contact the [place] @ [phone]  On-Call/Text Page:      [email or web address]      password [email or web address]  If 7PM-7AM, please contact night-coverage [email or web address] Password [email or web address] 08/22/2013, 2:25 PM   LOS: 2 days   I have personally examined this patient and reviewed the entire database. I have reviewed the above note, made any necessary editorial changes, and agree with its content.  Cherene Altes, MD [facility]

## 2013-08-22 NOTE — Progress Notes (Signed)
Subjective:  No chest pain like his prior heart pain. No pleuritic symptoms.  Objective:   Vital Signs in the last 24 hours: Temp:  [97.8 F (36.6 C)-98 F (36.7 C)] 97.8 F (36.6 C) (04/13 1145) Pulse Rate:  [89-98] 89 (04/13 0800) Resp:  [13-29] 16 (04/13 1200) BP: (72-127)/(49-95) 89/64 mmHg (04/13 1200) SpO2:  [94 %-98 %] 98 % (04/13 1145)  Intake/Output from previous day: 04/12 0701 - 04/13 0700 In: 1645.8 [P.O.:820; I.V.:825.8] Out: 2575 [Urine:2575]  Medications: . aspirin EC  81 mg Oral Daily  . atorvastatin  40 mg Oral q1800  . Chlorhexidine Gluconate Cloth  6 each Topical Q0600  . isosorbide mononitrate  60 mg Oral q morning - 10a  . metoprolol tartrate  25 mg Oral BID  . mupirocin ointment  1 application Nasal BID  . pantoprazole (PROTONIX) IV  40 mg Intravenous Q12H  . sodium chloride  3 mL Intravenous Q12H    . sodium chloride 1 mL/kg/hr (08/22/13 0439)  . diltiazem (CARDIZEM) infusion 5 mg/hr (08/22/13 0437)  . heparin 1,600 Units/hr (08/22/13 0252)  . nitroGLYCERIN Stopped (08/21/13 1900)    Physical Exam:   General appearance: alert, cooperative and no distress Neck: no carotid bruit, no JVD, supple, symmetrical, trachea midline and thyroid not enlarged, symmetric, no tenderness/mass/nodules Lungs: decreased BS Heart: regular rate and rhythm, no rub and 1/6 sem Abdomen: soft, non-tender; bowel sounds normal; no masses,  no organomegaly Extremities: no edema, redness or tenderness in the calves or thighs Pulses: 2+ and symmetric Skin: Skin color, texture, turgor normal. No rashes or lesions Neurologic: Grossly normal   Rate: 100  Rhythm: sinus tachycardia  HAD AF RVR this am treated with IV Lopressor and Cardizem; converted to sinus but BP had 70 systolic; now off cardizem.  Lab Results:     Recent Labs  08/20/13 1545 08/21/13 0204  NA 136* 136*  K 4.3 4.8  CL 96 100  CO2 25 26  GLUCOSE 230* 136*  BUN 20 19  CREATININE 1.31 1.18      Recent Labs  08/20/13 1800 08/21/13 0025  TROPONINI 0.45* 8.47*   Hepatic Function Panel No results found for this basename: PROT, ALBUMIN, AST, ALT, ALKPHOS, BILITOT, BILIDIR, IBILI,  in the last 72 hours No results found for this basename: INR,  in the last 72 hours BNP (last 3 results)  Recent Labs  04/28/13 1430 08/03/13 1130 08/20/13 1811  PROBNP 678.8* 2537.0* 1986.0*    Lipid Panel     Component Value Date/Time   CHOL 133 08/22/2013 0314   TRIG 105 08/22/2013 0314   HDL 43 08/22/2013 0314   CHOLHDL 3.1 08/22/2013 0314   VLDL 21 08/22/2013 0314   LDLCALC 69 08/22/2013 0314    Troponins.47 c/w NSTEMI  Imaging:  Dg Chest 2 View  08/20/2013   CLINICAL DATA:  Chest pain and shortness of breath for 1 day  EXAM: CHEST  2 VIEW  COMPARISON:  CT CHEST W/CM dated 08/03/2013; DG CHEST 1V PORT dated 04/29/2013  FINDINGS: Mild cardiac enlargement is stable. Vascular pattern is normal. There is an irregular 1.5 cm opacity laterally in the right lower lobe that was not present previously. There is mild diffuse interstitial change bilaterally, mildly worse on the left. There is and mild increased in severity of this process, some of which if not all may be related to technique. The findings do not appear consistent with pulmonary edema. There are no pleural effusions. There is a 1  cm oval lytic area in the lateral right sixth rib.  IMPRESSION: Irregular right lower lobe opacity concerning for pulmonary nodule. CT thorax recommended to further evaluate.  Mild diffuse interstitial change left lung greater than right. Significance is uncertain. The finding does not appear to be related to pulmonary edema. Atypical pneumonia is a consideration. Chronic interstitial lung disease is another consideration, as would be lymphangitic metastasis.  Evidence of lytic lesion involving the lateral right sixth rib, metastasis not excluded.   Electronically Signed   By: Skipper Cliche M.D.   On: 08/20/2013  16:40   Ct Angio Chest Pe W/cm &/or Wo Cm  08/20/2013   CLINICAL DATA:  Severe shortness of breath and known blood clots, being treated with Lovenox, history of lung cancer with chemotherapy and radiation therapy completed, chest pain with burning sensation  EXAM: CT ANGIOGRAPHY CHEST WITH CONTRAST  TECHNIQUE: Multidetector CT imaging of the chest was performed using the standard protocol during bolus administration of intravenous contrast. Multiplanar CT image reconstructions and MIPs were obtained to evaluate the vascular anatomy.  CONTRAST:  19mL OMNIPAQUE IOHEXOL 350 MG/ML SOLN  COMPARISON:  DG CHEST 2 VIEW dated 08/20/2013; CT CHEST W/CM dated 08/03/2013  FINDINGS: Again identified is pulmonary embolism involving right middle lobe and right lower lobe branches as well as left lower lobe and lingular branches. Clot burden is moderate but slightly decreased when compared to prior study. RV to LV ratio is again 0.77 with no evidence of right heart strain.  There is a moderate pericardial effusion measuring up to about 28 mm. Right paratracheal adenopathy stable.  Chronic right rib deformities stable. Diffuse emphysematous change stable. Postsurgical change right perihilar area, unchanged from prior examination. On the left side, there is an irregular lower lobe mass measuring 14 x 11 mm. It is increased in degree of consolidation and size when compared to recent prior study and is consistent with metastasis. In the left midlung zone there is an irregular opacity measuring 19 x 16 mm, not significantly different from the prior study. There is mild increase in the degree of diffuse interstitial prominence in the left central lung region. There is a tiny left pleural effusion, similar to the prior study. Pericardial effusion has mildly increased when compared to the prior study.  No significant acute abnormality involving the thoracic aorta. Scans through the upper abdomen are unremarkable.  Review of the MIP  images confirms the above findings.  IMPRESSION: Slightly decreased clot burden when compared to prior study.  Mild increase in the size of the pericardial effusion.  Stable nodule in the lingula concerning for metastasis. Enlarging left lower lobe irregular nodule concerning for metastasis. 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   Electronically Signed   By: Skipper Cliche M.D.   On: 08/20/2013 17:38      Assessment/Plan:   Principal Problem:   Atypical chest pain Active Problems:   CAD, NATIVE VESSEL   Small cell carcinoma of lung   Protein-calorie malnutrition, severe   Dysphagia, unspecified(787.20)   Stricture and stenosis of esophagus   Radiation esophagitis   Submassive pulmonary embolism   NSTEMI (non-ST elevated myocardial infarction)   Diastolic CHF   Pericardial effusion   CT reviewed. Very slight reduction of thrombus burden. Now in sinus but had AFRVR earlier today and developed hypotension on IV cardizem. Pericardial effusion is now felt to be moderate. Will check echo ? Metastatic/ ?hemorragic. Trop increased to 8 c/w NSTEMI without acute ECG changes. No  pleruritc pain or angina presently on heparin. Cath schedule is overburdened today; will defer cath until tomorrow am. Check echo today. He was supposed to start brain radiation tomorrow prophylactically; will need to defer.   Troy Sine, MD, [facility] 08/22/2013, 12:38 PM

## 2013-08-22 NOTE — Progress Notes (Signed)
ANTICOAGULATION CONSULT NOTE - Follow Up Consult  Pharmacy Consult for heparin Indication: NSTEMI  Labs:  Recent Labs  08/20/13 1545 08/20/13 1800 08/21/13 0025  08/21/13 0204  08/21/13 1545 08/21/13 2245 08/22/13 0314  HGB 13.8  --   --   --  12.3*  --   --   --  12.2*  HCT 41.1  --   --   --  37.2*  --   --   --  36.2*  PLT 176  --   --   --  158  --   --   --  153  HEPARINUNFRC  --   --   --   < >  --   < > 0.27* 0.54 0.45  CREATININE 1.31  --   --   --  1.18  --   --   --   --   TROPONINI <0.30 0.45* 8.47*  --   --   --   --   --   --   < > = values in this interval not displayed.   Assessment/Plan:  58yo male now therapeutic on heparin after rate increase last night. Will continue gtt at current rate and follow up after cath. No bleeding noted, cbc remains stable.  Erin Hearing PharmD., BCPS Clinical Pharmacist Pager [phone] 08/22/2013 12:34 PM

## 2013-08-22 NOTE — Progress Notes (Signed)
Patient got up to the side of the bed to void and had HR go up to 150's. 12 lead EKG done and showed afib with RVR. Dr. Mancel Bale notifited and orders received to give Metoprolol 2.5 mg IV and if this does not work to give Cardizem 10 mg IV and start gtt. Metoprolol  Given.  2 liters [place] was placed and he is resting comfortably at this time. Will monitor for rate control. Patient is already on heparin gtt.

## 2013-08-22 NOTE — Care Management Note (Addendum)
    Page 1 of 1   08/24/2013     4:09:35 PM   CARE MANAGEMENT NOTE 08/24/2013  Patient:  [name], [name]   Account Number:  [id number]  Date Initiated:  08/22/2013  Documentation initiated by:  Elissa Hefty  Subjective/Objective Assessment:   adm w ch pain     Action/Plan:   lives w friend, pcp dr Lelon Huh   Anticipated DC Date:     Anticipated DC Plan:  [place]  CM consult      Choice offered to / List presented to:             Status of service:   Medicare Important Message given?   (If response is "NO", the following Medicare IM given date fields will be blank) Date Medicare IM given:   Date Additional Medicare IM given:    Discharge Disposition:  HOME/SELF CARE  Per UR Regulation:  Reviewed for med. necessity/level of care/duration of stay  If discussed at [place] of Stay Meetings, dates discussed:   08/25/2013    Comments:  08-24-13 Lyndon, RN, BSN [phone] Pt is on 02 at home. No needs from CM at this time.   4/14 15:00 debbie dowell rn,bsn spoke w pt. he has blue cross blue shield ins. he states no problems getting his meds. he does not anticipate any dc needs.he's anxious to get home and hoping for wed or thurs to dc home. will follow up w md in office.

## 2013-08-22 NOTE — Progress Notes (Signed)
BP on left and right arm is on 20'F systolic, asymptomatic,  cardizem gtt stopped, ns bolus 250 ml given, MD aware. Continue to monitor. Latest bp 82/49. No complaints presented.

## 2013-08-23 ENCOUNTER — Other Ambulatory Visit: Payer: Self-pay

## 2013-08-23 ENCOUNTER — Ambulatory Visit
Admission: RE | Admit: 2013-08-23 | Payer: BC Managed Care – PPO | Source: Ambulatory Visit | Admitting: Radiation Oncology

## 2013-08-23 ENCOUNTER — Encounter (HOSPITAL_COMMUNITY)
Admission: EM | Disposition: A | Discharge status: Home / Self Care | Payer: BC Managed Care – PPO | Source: Home / Self Care | Attending: Internal Medicine

## 2013-08-23 DIAGNOSIS — K208 Other esophagitis without bleeding: Secondary | ICD-10-CM

## 2013-08-23 DIAGNOSIS — C3490 Malignant neoplasm of unspecified part of unspecified bronchus or lung: Secondary | ICD-10-CM | POA: Insufficient documentation

## 2013-08-23 DIAGNOSIS — I2510 Atherosclerotic heart disease of native coronary artery without angina pectoris: Secondary | ICD-10-CM

## 2013-08-23 DIAGNOSIS — E119 Type 2 diabetes mellitus without complications: Secondary | ICD-10-CM

## 2013-08-23 DIAGNOSIS — I2699 Other pulmonary embolism without acute cor pulmonale: Secondary | ICD-10-CM

## 2013-08-23 DIAGNOSIS — J441 Chronic obstructive pulmonary disease with (acute) exacerbation: Secondary | ICD-10-CM

## 2013-08-23 DIAGNOSIS — R0902 Hypoxemia: Secondary | ICD-10-CM

## 2013-08-23 DIAGNOSIS — J9620 Acute and chronic respiratory failure, unspecified whether with hypoxia or hypercapnia: Secondary | ICD-10-CM

## 2013-08-23 DIAGNOSIS — I214 Non-ST elevation (NSTEMI) myocardial infarction: Principal | ICD-10-CM

## 2013-08-23 DIAGNOSIS — Z538 Procedure and treatment not carried out for other reasons: Secondary | ICD-10-CM | POA: Insufficient documentation

## 2013-08-23 DIAGNOSIS — R1310 Dysphagia, unspecified: Secondary | ICD-10-CM

## 2013-08-23 DIAGNOSIS — I4891 Unspecified atrial fibrillation: Secondary | ICD-10-CM

## 2013-08-23 DIAGNOSIS — J449 Chronic obstructive pulmonary disease, unspecified: Secondary | ICD-10-CM

## 2013-08-23 DIAGNOSIS — I10 Essential (primary) hypertension: Secondary | ICD-10-CM

## 2013-08-23 DIAGNOSIS — I517 Cardiomegaly: Secondary | ICD-10-CM

## 2013-08-23 DIAGNOSIS — Z510 Encounter for antineoplastic radiation therapy: Secondary | ICD-10-CM | POA: Diagnosis present

## 2013-08-23 DIAGNOSIS — I5032 Chronic diastolic (congestive) heart failure: Secondary | ICD-10-CM

## 2013-08-23 HISTORY — PX: LEFT HEART CATHETERIZATION WITH CORONARY ANGIOGRAM: SHX5451

## 2013-08-23 LAB — BASIC METABOLIC PANEL
BUN: 14 mg/dL (ref 6–23)
CALCIUM: 9.4 mg/dL (ref 8.4–10.5)
CO2: 26 mEq/L (ref 19–32)
Chloride: 99 mEq/L (ref 96–112)
Creatinine, Ser: 1.29 mg/dL (ref 0.50–1.35)
GFR calc Af Amer: 70 mL/min — ABNORMAL LOW (ref >90)
GFR calc non Af Amer: 60 mL/min — ABNORMAL LOW (ref >90)
GLUCOSE: 106 mg/dL — AB (ref 70–99)
POTASSIUM: 4.6 meq/L (ref 3.7–5.3)
SODIUM: 139 meq/L (ref 137–147)

## 2013-08-23 LAB — GLUCOSE, CAPILLARY
GLUCOSE-CAPILLARY: 210 mg/dL — AB (ref 70–99)
Glucose-Capillary: 108 mg/dL — ABNORMAL HIGH (ref 70–99)
Glucose-Capillary: 168 mg/dL — ABNORMAL HIGH (ref 70–99)

## 2013-08-23 LAB — CBC
HCT: 34.1 % — ABNORMAL LOW (ref 39.0–52.0)
HEMOGLOBIN: 11.3 g/dL — AB (ref 13.0–17.0)
MCH: 31.4 pg (ref 26.0–34.0)
MCHC: 33.1 g/dL (ref 30.0–36.0)
MCV: 94.7 fL (ref 78.0–100.0)
Platelets: 144 10*3/uL — ABNORMAL LOW (ref 150–400)
RBC: 3.6 MIL/uL — AB (ref 4.22–5.81)
RDW: 15 % (ref 11.5–15.5)
WBC: 6 10*3/uL (ref 4.0–10.5)

## 2013-08-23 LAB — PROTIME-INR
INR: 1.04 (ref 0.00–1.49)
Prothrombin Time: 13.4 seconds (ref 11.6–15.2)

## 2013-08-23 LAB — HEPARIN LEVEL (UNFRACTIONATED): Heparin Unfractionated: 0.51 IU/mL (ref 0.30–0.70)

## 2013-08-23 LAB — POCT ACTIVATED CLOTTING TIME: Activated Clotting Time: 420 seconds

## 2013-08-23 SURGERY — LEFT HEART CATHETERIZATION WITH CORONARY ANGIOGRAM
Anesthesia: LOCAL

## 2013-08-23 MED ORDER — LIDOCAINE HCL (PF) 1 % IJ SOLN
INTRAMUSCULAR | Status: AC
  Filled 2013-08-23: qty 30

## 2013-08-23 MED ORDER — FAMOTIDINE IN NACL 20-0.9 MG/50ML-% IV SOLN
INTRAVENOUS | Status: AC
  Filled 2013-08-23: qty 50

## 2013-08-23 MED ORDER — VERAPAMIL HCL 2.5 MG/ML IV SOLN
INTRAVENOUS | Status: AC
  Filled 2013-08-23: qty 2

## 2013-08-23 MED ORDER — PANTOPRAZOLE SODIUM 40 MG PO TBEC
40.0000 mg | DELAYED_RELEASE_TABLET | Freq: Two times a day (BID) | ORAL | Status: DC
  Filled 2013-08-23: qty 1

## 2013-08-23 MED ORDER — MIDAZOLAM HCL 2 MG/2ML IJ SOLN
INTRAMUSCULAR | Status: AC
  Filled 2013-08-23: qty 2

## 2013-08-23 MED ORDER — HEPARIN SODIUM (PORCINE) 1000 UNIT/ML IJ SOLN
INTRAMUSCULAR | Status: AC
  Filled 2013-08-23: qty 1

## 2013-08-23 MED ORDER — SODIUM CHLORIDE 0.9 % IV SOLN
0.2500 mg/kg/h | INTRAVENOUS | Status: AC
  Administered 2013-08-23: 0.25 mg/kg/h via INTRAVENOUS
  Filled 2013-08-23: qty 250

## 2013-08-23 MED ORDER — BOOST / RESOURCE BREEZE PO LIQD
1.0000 | Freq: Three times a day (TID) | ORAL | Status: DC
  Administered 2013-08-23 – 2013-08-24 (×4): 1 via ORAL

## 2013-08-23 MED ORDER — FENTANYL CITRATE 0.05 MG/ML IJ SOLN
INTRAMUSCULAR | Status: AC
  Filled 2013-08-23: qty 2

## 2013-08-23 MED ORDER — PANTOPRAZOLE SODIUM 40 MG PO PACK
40.0000 mg | PACK | Freq: Two times a day (BID) | ORAL | Status: DC
  Filled 2013-08-23: qty 20

## 2013-08-23 MED ORDER — ENOXAPARIN SODIUM 150 MG/ML ~~LOC~~ SOLN
1.5000 mg/kg | SUBCUTANEOUS | Status: DC
  Administered 2013-08-24 – 2013-08-25 (×2): 130 mg via SUBCUTANEOUS
  Filled 2013-08-23 (×2): qty 1

## 2013-08-23 MED ORDER — ENSURE PUDDING PO PUDG: 1.0000 | Freq: Three times a day (TID) | ORAL | Status: DC

## 2013-08-23 MED ORDER — CLOPIDOGREL BISULFATE 300 MG PO TABS
ORAL_TABLET | ORAL | Status: AC
  Administered 2013-08-24: 75 mg via ORAL
  Filled 2013-08-23: qty 2

## 2013-08-23 MED ORDER — PANTOPRAZOLE SODIUM 40 MG PO PACK
40.0000 mg | PACK | Freq: Two times a day (BID) | ORAL | Status: DC
  Administered 2013-08-23 – 2013-08-25 (×3): 40 mg via ORAL
  Filled 2013-08-23 (×6): qty 20

## 2013-08-23 MED ORDER — SODIUM CHLORIDE 0.9 % IV SOLN
1.0000 mL/kg/h | INTRAVENOUS | Status: AC
  Administered 2013-08-23: 1 mL/kg/h via INTRAVENOUS

## 2013-08-23 MED ORDER — MORPHINE SULFATE 2 MG/ML IJ SOLN: 2.0000 mg | INTRAMUSCULAR | Status: DC | PRN

## 2013-08-23 MED ORDER — BIVALIRUDIN 250 MG IV SOLR
INTRAVENOUS | Status: AC
  Filled 2013-08-23: qty 250

## 2013-08-23 MED ORDER — HEPARIN (PORCINE) IN NACL 2-0.9 UNIT/ML-% IJ SOLN
INTRAMUSCULAR | Status: AC
  Filled 2013-08-23: qty 1500

## 2013-08-23 MED ORDER — ENOXAPARIN SODIUM 100 MG/ML ~~LOC~~ SOLN
1.0000 mg/kg | Freq: Once | SUBCUTANEOUS | Status: DC
  Filled 2013-08-23: qty 1

## 2013-08-23 MED ORDER — ENOXAPARIN SODIUM 80 MG/0.8ML ~~LOC~~ SOLN
80.0000 mg | Freq: Once | SUBCUTANEOUS | Status: DC
  Filled 2013-08-23: qty 0.8

## 2013-08-23 MED ORDER — NITROGLYCERIN 0.2 MG/ML ON CALL CATH LAB
INTRAVENOUS | Status: AC
  Filled 2013-08-23: qty 1

## 2013-08-23 MED ORDER — CLOPIDOGREL BISULFATE 75 MG PO TABS
75.0000 mg | ORAL_TABLET | Freq: Every day | ORAL | Status: DC
  Administered 2013-08-24 – 2013-08-25 (×2): 75 mg via ORAL
  Filled 2013-08-23 (×2): qty 1

## 2013-08-23 MED ORDER — MORPHINE SULFATE 10 MG/ML IJ SOLN
INTRAMUSCULAR | Status: AC
  Filled 2013-08-23: qty 1

## 2013-08-23 NOTE — Brief Op Note (Addendum)
   BRIEF CARDIAC CATH & PCI NOTE. 08/20/2013 - 08/23/2013  12:22 PM  PATIENT:  [name]  58 y.o. male with known CAD-PCI who p/w NSTEMI.  Referred for Cardiac Cath +/- PCI.  PRE-OPERATIVE DIAGNOSIS:  chest pain  POST-OPERATIVE DIAGNOSIS:    Severe 2+ vessel CAD with known RCA & OM2 occlusion & now early mid-proximal Circumflex occlusion with the most severe lesion being @ the take-off of the occluded OM2.  Successful revascularization of the entire prox-mid Circumflex 100% ISR with 2 overlapping Xience Alpine DES - 2.75 mm x 15 mm (distal - beyond OM2 - post-dilated to 3.0 mm) & 3.5 mm x 28 mm proximal to mid (overlapping) -- post dilated to 3.8 mm.  Moderately reduced LVEF with severely elevated LVEDP of ~ 25 mmHg  PROCEDURE:  Procedure(s):  LEFT HEART CATHETERIZATION WITH CORONARY ANGIOGRAM (N/A)  COMPLEX PCI OF 100% OCCLUDED (PROXIMAL-MID-DISTAL) CIRCUMFLEX WITH 2 DES AS NOTED  SURGEON:  Surgeon(s) and Role:    * Leonie Man, MD - Primary  ANESTHESIA:   local and IV sedation; 4 mg Versed, 4 mg Morphine  EBL:  Total I/O In: 110.4 [I.V.:110.4] Out: 450 [Urine:450]  CATH & PCI PROCEDURE: R Radial A 6 Fr Sheath -- 5 Fr TIG 4.0 & Angled Pig -- 6Fr XB 3.5 FGuide --> multple pre-dilations -- 2 overlapping Xience Alpine DES (2.75 mm x 16 mm - post-dilated to 3.0 mm) & 3.5 mm x 28 mm (post-dilated to 3.8 mm)    MEDICATIONS USED:  IV Heparin 4500 Units; Angiomax Bolus & gtt; Plavix 600 mg; IC NTG 400 mcg; Omnipaque contrast 215 mL.  TOURNIQUET: TR BAND 1205 Hrs; 14 mL air  DICTATION: .Note written in [place]:  Return to TCU for post radial cath care.  Continue Angiomax x 2 hrs at reduced rate  DAPT for minimum of 3 months, then can stop ASA  May require diuresis.  Anticipate transfer to telemetry in AM & d/c in ~48 hr.  PATIENT DISPOSITION:  PACU - hemodynamically stable.   Delay start of Pharmacological VTE agent (>24hrs) due to surgical blood loss  or risk of bleeding: not applicable   Leonie Man, MD

## 2013-08-23 NOTE — Progress Notes (Signed)
[facility]  OLUWASEYI RAFFEL EPP:[id number] DOB: 14-Sep-1955 DOA: 08/20/2013 PCP: [name], MD  Time spent : 81mins  Brief narrative: 58 year old WM PMHx small cell lung CA dx 2014 s/p XRT and chemotherapy, COPD gold stage 2 on intermittent 2L home oxygen, CAD s/p 3 MIs and multiple stents, T2DM, CKD stage 3, pulmonary embolism on therapeutic Lovenox at home presented with chest pain that started at lunchtime on day of admit. The patient described the chest pain as burning, sometimes radiating towards the neck, worsens intermittently. Patient reported that he had "heart attack" twice and this is not "his heart", no worsening chest pain with deep breathing or coughing.   Patient had recent echocardiogram done in March 2015 which showed EF 55-60% with grade 1 diastolic dysfunction. EKG did not show acute ST-T wave changes c/w ischemia, troponin x1 was negative.  Patient has a known history of radiation-induced esophagitis and moderate to severe esophageal stricture, has been having serial dilatations, currently scheduled to have another dilation, last done on 07/26/13 by Dr Deatra Ina   After admission his TNI bumped- initially subtly to 0.45 but 3rd reading bumped to 8.47. Cardiology was consulted and recommended tx to [facility] for possible catheterization.  HPI/Subjective: States no CP but still with dysphagia and odynophagia that was present prior to admit  Assessment/Plan:    CAD/NSTEMI / chest pain/2V CAD post PCI and stents -per Cards  -known dz RCA and OM2 -toady post revasc  Cfx and OM2 -Plavix x at least 3 months -repeat TNI in am- peak thus far >8- last two readings 4.5 and 5.69 so will repeat i am- suspect will go back up post revasc -Cards postulates may need diuresis although per our exam seems dry (see below) -Will obtain CBC, CMP, magnesium; hemoglobin goal> 8g/dL, potassium goal> 15meq/L, and magnesium goal> 2    Atrial fibrillation with  RVR -had brief run this admit and developed hypotension with Cardizem gtt -cont maintain NSR -suspect volume depletion in setting of severe esophagitis is cause-on IVF per pre cath orders -cont BB but may need to change to IV dosing given soft BPs    Pericardial effusion -Clarified by Cards as possibly moderate sized-etiology could be either metastatic or hemorrhagic -ECHO demonstrated trivial effusion-follow prn    Acute on chronic respiratory failure with hypoxia:   A) Severe chronic obstructive pulmonary disease   B) Small cell carcinoma of lung   C) Chronic diastolic CHF (congestive heart failure), NYHA class 1 -intermittently on 2L at home-currently stable on RA -was scheduled to start prophylactic radiation rxn to brain 4/14 but now on hold -no evidence of decompensated HF-actually seems dry    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 -last dilatation 07/26/13 -pt endorsed prior SLP eval and plans for add'l dilatation next week -will change to full liquid diet (since was diet pre admit) and supplement with Ensure and Resource    Diabetes mellitus, type 2 -begin SSI -diet controlled at home -HgbA1c 5.6-was 8.4 in December and likely reflective of porr intake from esophagitis    HYPERTENSION, UNSPECIFIED -BP soft    Protein-calorie malnutrition, severe -see above    Submassive pulmonary embolism -dx'd March 25,2015 -Lovenox per pharmacy-likely will need to dc on this since malignancy related embolism-was on daily dosing at home (high dose) which should suffice for PE tx and be more cost effective than BID dosing  DVT prophylaxis: IV heparin Code Status: Full Family Communication:  family at bedside Disposition Plan/Expected LOS: Step down  Consultants: Cardiology  Procedures: 2-D echocardiogram  - Left ventricle: The cavity size was mildly dilated. Wall thickness was normal. Systolic function was normal. The estimated ejection fraction was in  the range of 60% to 65%. Wall motion was normal; there were no regional wall motion abnormalities. There was an increased relative contribution of atrial contraction to ventricular filling. - Pericardium, extracardiac: A trivial pericardial effusion was identified. There was no evidence of hemodynamic Compromise.  Left heart cath Severe 2+ vessel CAD with known RCA & OM2 occlusion & now early mid-proximal Circumflex occlusion with the most severe lesion being @ the take-off of the occluded OM2.  Successful revascularization of the entire prox-mid Circumflex 100% ISR with 2 overlapping Xience Alpine DES - 2.75 mm x 15 mm (distal - beyond OM2 - post-dilated to 3.0 mm) & 3.5 mm x 28 mm proximal to mid (overlapping) -- post dilated to 3.8 mm.  Moderately reduced LVEF with severely elevated LVEDP of ~ 25 mmHg  Antibiotics: None  Objective: Blood pressure 131/101, pulse 93, temperature 97.9 F (36.6 C), temperature source Oral, resp. rate 19, height 5\' 10"  (1.778 m), weight 191 lb 9.3 oz (86.9 kg), SpO2 96.00%.  Intake/Output Summary (Last 24 hours) at 08/23/13 1413 Last data filed at 08/23/13 0835  Gross per 24 hour  Intake  968.6 ml  Output   1350 ml  Net -381.4 ml   Exam: General: No acute respiratory distress Lungs: Clear to auscultation bilaterally without wheezes or crackles, RA Cardiovascular: Irregular rate and rhythm earlier this morning but has subsequently converted to sinus rhythm/sinus tachycardia without murmur gallop or rub normal S1 and S2, no peripheral edema or JVD Abdomen: Nontender, nondistended, soft, bowel sounds positive, no rebound, no ascites, no appreciable mass Musculoskeletal: No significant cyanosis, clubbing of bilateral lower extremities Neurological: Alert and oriented x 3, moves all extremities x 4 without focal neurological deficits, CN 2-12 intact  Scheduled Meds:  Scheduled Meds: . aspirin EC  81 mg Oral Daily  . atorvastatin  40 mg Oral q1800  .  Chlorhexidine Gluconate Cloth  6 each Topical Q0600  . [START ON 08/24/2013] clopidogrel  75 mg Oral Q breakfast  . feeding supplement (ENSURE)  1 Container Oral TID BM  . feeding supplement (RESOURCE BREEZE)  1 Container Oral TID BM  . insulin aspart  0-5 Units Subcutaneous QHS  . insulin aspart  0-9 Units Subcutaneous TID WC  . isosorbide mononitrate  60 mg Oral q morning - 10a  . metoprolol tartrate  25 mg Oral BID  . mupirocin ointment  1 application Nasal BID  . pantoprazole (PROTONIX) IV  40 mg Intravenous Q12H   Data Reviewed: Basic Metabolic Panel:  Recent Labs Lab 08/20/13 1545 08/21/13 0204 08/23/13 0333  NA 136* 136* 139  K 4.3 4.8 4.6  CL 96 100 99  CO2 25 26 26   GLUCOSE 230* 136* 106*  BUN 20 19 14   CREATININE 1.31 1.18 1.29  CALCIUM 9.7 9.1 9.4   Liver Function Tests: No results found for this basename: AST, ALT, ALKPHOS, BILITOT, PROT, ALBUMIN,  in the last 168 hours  CBC:  Recent Labs Lab 08/20/13 1545 08/21/13 0204 08/22/13 0314 08/23/13 0333  WBC 6.3 4.6 6.5 6.0  NEUTROABS 5.0  --   --   --   HGB 13.8 12.3* 12.2* 11.3*  HCT 41.1 37.2* 36.2* 34.1*  MCV 93.6 94.7 94.8 94.7  PLT 176 158 153 144*  Cardiac Enzymes:  Recent Labs Lab 08/20/13 1545 08/20/13 1800 08/21/13 0025 08/22/13 1400 08/22/13 1940  TROPONINI <0.30 0.45* 8.47* 4.50* 5.69*   BNP (last 3 results)  Recent Labs  04/28/13 1430 08/03/13 1130 08/20/13 1811  PROBNP 678.8* 2537.0* 1986.0*    Recent Results (from the past 240 hour(s))  MRSA PCR SCREENING     Status: Abnormal   Collection Time    08/20/13  9:20 PM      Result Value Ref Range Status   MRSA by PCR POSITIVE (*) NEGATIVE Corrected   Comment: RESULT CALLED TO, READ BACK BY AND VERIFIED WITH:     SPOKE WITH REEVES,M RN [phone] [id number] COVINGTON,N     CORRECTED RESULTS CALLED TO:     REEVES,M RN [phone] COVINGTON,N     CORRECTED ON 04/12 AT 0429: PREVIOUSLY REPORTED AS POSITIVE        The GeneXpert MRSA Assay  (FDA approved for NASAL specimens only), is one component of a comprehensive MRSA colonization surveillance program. It is not intended to diagnose MRSA infection      nor to guide or monitor treatment for MRSA infections. INVALID RESULTS, SPECIMEN SENT FOR CULTURE SPOKE WITH REEVES,M RN [phone] COVINGTON,N     Studies:  Recent x-ray studies have been reviewed in detail by the Attending Physician       Erin Hearing, ANP [facility] Office  [phone] Pager [phone]  **If unable to reach the above provider after paging please contact the [place] @ [phone]  On-Call/Text Page:      [email or web address]      password [email or web address]  If 7PM-7AM, please contact night-coverage [email or web address] Password [email or web address] 08/23/2013, 2:13 PM   LOS: 3 days  -Examining patient and agree with exam and assessment and plan of ANP Ebony Hail -Will obtain CMP, CBC, magnesium if hemoglobin, potassium, magnesium below establish goals hospitalists to be paged

## 2013-08-23 NOTE — Interval H&P Note (Signed)
History and Physical Interval Note:  08/23/2013 8:54 AM  [name]  has presented today for surgery, with the diagnosis of NSTEMI & Known CAD.  The various methods of treatment have been discussed with the patient and family. After consideration of risks, benefits and other options for treatment, the patient has consented to  Procedure(s): LEFT HEART CATHETERIZATION WITH CORONARY ANGIOGRAM (N/A) as a surgical intervention .    The patient's history has been reviewed, patient examined, had a recurrent episode of CP this AM without acute ECG changes. He is stable for surgery.  I have reviewed the patient's chart and labs.  Questions were answered to the patient's satisfaction.     [name]  Cath Lab Visit (complete for each Cath Lab visit)  Clinical Evaluation Leading to the Procedure:   ACS: yes  Non-ACS:    Anginal Classification: CCS III  Anti-ischemic medical therapy: Maximal Therapy (2 or more classes of medications)  Non-Invasive Test Results: No non-invasive testing performed  Prior CABG: No previous CABG

## 2013-08-23 NOTE — Progress Notes (Signed)
  Echocardiogram 2D Echocardiogram has been performed.  [name] 08/23/2013, 9:08 AM

## 2013-08-23 NOTE — Progress Notes (Signed)
After having breathing treatment, patient called nurse to the room and stated that he had chest pain and some jaw pain at 6:45 am. Ekg done and no changes were noted. Chest pain resolved before finishing Ekg. He is scheduled for cath lab this morning.

## 2013-08-23 NOTE — H&P (View-Only) (Signed)
Subjective:  No chest pain like his prior heart pain. No pleuritic symptoms.  Objective:   Vital Signs in the last 24 hours: Temp:  [97.8 F (36.6 C)-98 F (36.7 C)] 97.8 F (36.6 C) (04/13 1145) Pulse Rate:  [89-98] 89 (04/13 0800) Resp:  [13-29] 16 (04/13 1200) BP: (72-127)/(49-95) 89/64 mmHg (04/13 1200) SpO2:  [94 %-98 %] 98 % (04/13 1145)  Intake/Output from previous day: 04/12 0701 - 04/13 0700 In: 1645.8 [P.O.:820; I.V.:825.8] Out: 2575 [Urine:2575]  Medications: . aspirin EC  81 mg Oral Daily  . atorvastatin  40 mg Oral q1800  . Chlorhexidine Gluconate Cloth  6 each Topical Q0600  . isosorbide mononitrate  60 mg Oral q morning - 10a  . metoprolol tartrate  25 mg Oral BID  . mupirocin ointment  1 application Nasal BID  . pantoprazole (PROTONIX) IV  40 mg Intravenous Q12H  . sodium chloride  3 mL Intravenous Q12H    . sodium chloride 1 mL/kg/hr (08/22/13 0439)  . diltiazem (CARDIZEM) infusion 5 mg/hr (08/22/13 0437)  . heparin 1,600 Units/hr (08/22/13 0252)  . nitroGLYCERIN Stopped (08/21/13 1900)    Physical Exam:   General appearance: alert, cooperative and no distress Neck: no carotid bruit, no JVD, supple, symmetrical, trachea midline and thyroid not enlarged, symmetric, no tenderness/mass/nodules Lungs: decreased BS Heart: regular rate and rhythm, no rub and 1/6 sem Abdomen: soft, non-tender; bowel sounds normal; no masses,  no organomegaly Extremities: no edema, redness or tenderness in the calves or thighs Pulses: 2+ and symmetric Skin: Skin color, texture, turgor normal. No rashes or lesions Neurologic: Grossly normal   Rate: 100  Rhythm: sinus tachycardia  HAD AF RVR this am treated with IV Lopressor and Cardizem; converted to sinus but BP had 70 systolic; now off cardizem.  Lab Results:     Recent Labs  08/20/13 1545 08/21/13 0204  NA 136* 136*  K 4.3 4.8  CL 96 100  CO2 25 26  GLUCOSE 230* 136*  BUN 20 19  CREATININE 1.31 1.18      Recent Labs  08/20/13 1800 08/21/13 0025  TROPONINI 0.45* 8.47*   Hepatic Function Panel No results found for this basename: PROT, ALBUMIN, AST, ALT, ALKPHOS, BILITOT, BILIDIR, IBILI,  in the last 72 hours No results found for this basename: INR,  in the last 72 hours BNP (last 3 results)  Recent Labs  04/28/13 1430 08/03/13 1130 08/20/13 1811  PROBNP 678.8* 2537.0* 1986.0*    Lipid Panel     Component Value Date/Time   CHOL 133 08/22/2013 0314   TRIG 105 08/22/2013 0314   HDL 43 08/22/2013 0314   CHOLHDL 3.1 08/22/2013 0314   VLDL 21 08/22/2013 0314   LDLCALC 69 08/22/2013 0314    Troponins.47 c/w NSTEMI  Imaging:  Dg Chest 2 View  08/20/2013   CLINICAL DATA:  Chest pain and shortness of breath for 1 day  EXAM: CHEST  2 VIEW  COMPARISON:  CT CHEST W/CM dated 08/03/2013; DG CHEST 1V PORT dated 04/29/2013  FINDINGS: Mild cardiac enlargement is stable. Vascular pattern is normal. There is an irregular 1.5 cm opacity laterally in the right lower lobe that was not present previously. There is mild diffuse interstitial change bilaterally, mildly worse on the left. There is and mild increased in severity of this process, some of which if not all may be related to technique. The findings do not appear consistent with pulmonary edema. There are no pleural effusions. There is a 1  cm oval lytic area in the lateral right sixth rib.  IMPRESSION: Irregular right lower lobe opacity concerning for pulmonary nodule. CT thorax recommended to further evaluate.  Mild diffuse interstitial change left lung greater than right. Significance is uncertain. The finding does not appear to be related to pulmonary edema. Atypical pneumonia is a consideration. Chronic interstitial lung disease is another consideration, as would be lymphangitic metastasis.  Evidence of lytic lesion involving the lateral right sixth rib, metastasis not excluded.   Electronically Signed   By: Skipper Cliche M.D.   On: 08/20/2013  16:40   Ct Angio Chest Pe W/cm &/or Wo Cm  08/20/2013   CLINICAL DATA:  Severe shortness of breath and known blood clots, being treated with Lovenox, history of lung cancer with chemotherapy and radiation therapy completed, chest pain with burning sensation  EXAM: CT ANGIOGRAPHY CHEST WITH CONTRAST  TECHNIQUE: Multidetector CT imaging of the chest was performed using the standard protocol during bolus administration of intravenous contrast. Multiplanar CT image reconstructions and MIPs were obtained to evaluate the vascular anatomy.  CONTRAST:  125mL OMNIPAQUE IOHEXOL 350 MG/ML SOLN  COMPARISON:  DG CHEST 2 VIEW dated 08/20/2013; CT CHEST W/CM dated 08/03/2013  FINDINGS: Again identified is pulmonary embolism involving right middle lobe and right lower lobe branches as well as left lower lobe and lingular branches. Clot burden is moderate but slightly decreased when compared to prior study. RV to LV ratio is again 0.77 with no evidence of right heart strain.  There is a moderate pericardial effusion measuring up to about 28 mm. Right paratracheal adenopathy stable.  Chronic right rib deformities stable. Diffuse emphysematous change stable. Postsurgical change right perihilar area, unchanged from prior examination. On the left side, there is an irregular lower lobe mass measuring 14 x 11 mm. It is increased in degree of consolidation and size when compared to recent prior study and is consistent with metastasis. In the left midlung zone there is an irregular opacity measuring 19 x 16 mm, not significantly different from the prior study. There is mild increase in the degree of diffuse interstitial prominence in the left central lung region. There is a tiny left pleural effusion, similar to the prior study. Pericardial effusion has mildly increased when compared to the prior study.  No significant acute abnormality involving the thoracic aorta. Scans through the upper abdomen are unremarkable.  Review of the MIP  images confirms the above findings.  IMPRESSION: Slightly decreased clot burden when compared to prior study.  Mild increase in the size of the pericardial effusion.  Stable nodule in the lingula concerning for metastasis. Enlarging left lower lobe irregular nodule concerning for metastasis. 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   Electronically Signed   By: Skipper Cliche M.D.   On: 08/20/2013 17:38      Assessment/Plan:   Principal Problem:   Atypical chest pain Active Problems:   CAD, NATIVE VESSEL   Small cell carcinoma of lung   Protein-calorie malnutrition, severe   Dysphagia, unspecified(787.20)   Stricture and stenosis of esophagus   Radiation esophagitis   Submassive pulmonary embolism   NSTEMI (non-ST elevated myocardial infarction)   Diastolic CHF   Pericardial effusion   CT reviewed. Very slight reduction of thrombus burden. Now in sinus but had AFRVR earlier today and developed hypotension on IV cardizem. Pericardial effusion is now felt to be moderate. Will check echo ? Metastatic/ ?hemorragic. Trop increased to 8 c/w NSTEMI without acute ECG changes. No  pleruritc pain or angina presently on heparin. Cath schedule is overburdened today; will defer cath until tomorrow am. Check echo today. He was supposed to start brain radiation tomorrow prophylactically; will need to defer.   Troy Sine, MD, [facility] 08/22/2013, 12:38 PM

## 2013-08-23 NOTE — Progress Notes (Signed)
Pt. Refused  scheduled Lovenox. Pt. Explained he thought the cardiologist did not want him on Lovenox because the doctor suspected it was the cause of his pericardial effusion.  RN looked through MD notes and could not find where cardiology discussed this. Ordering provider is [facility] per pharmacy consult. RN documented in [facility] patient refused. On call  Cards Fellow and [facility] not paged due to this being a possible miscommunication between the two care provider services. Night RN will pass this on to Day RN.

## 2013-08-23 NOTE — CV Procedure (Signed)
CARDIAC CATHETERIZATION AND PERCUTANEOUS CORONARY INTERVENTION REPORT  NAME:  [name]   MRN: [id number] DOB:  02/26/1956   ADMIT DATE: 08/20/2013 Procedure Date: 08/23/2013  INTERVENTIONAL CARDIOLOGIST: Leonie Man, M.D., MS PRIMARY CARE PROVIDER: [name], MD PRIMARY CARDIOLOGIST:  PATIENT:  [name] is a 58 y.o. male with h/o HTN, DM, hyperlipidemia, Lung CA  w/ radiation esophagitis, PE, COPD, Afib , lung ca p/w chest pain and being seen for NST-ACS. He has known RCA occludion & occluded stent in OM2 with ostial OM1 disease as well as 2 focal mdoerate to severe mid Circumflex lesions, treated medically.   PRE-OPERATIVE DIAGNOSIS:    NSTEMI  KNOWN CAD  PROCEDURES PERFORMED:    LEFT HEART CATHETERIZATION WITH NATIVE CORONARY ANGIOGRAPHY  COMPLEX / DIFFICULT PCI OF 100% (SUB-CHRONIC) OCCLUDED MID LEFT CIRCUMFLEX  PROCEDURE:Consent:  Risks of procedure as well as the alternatives and risks of each were explained to the (patient/caregiver).  Consent for procedure obtained. Consent for signed by MD and patient with RN witness -- placed on chart.   PROCEDURE: The patient was brought to the [facility] in the fasting state and prepped and draped in the usual sterile fashion for Right groin or radial access. A modified Allen's test with plethysmography was performed, revealing excellent Ulnar artery collateral flow.  Sterile technique was used including antiseptics, cap, gloves, gown, hand hygiene, mask and sheet.  Skin prep: Chlorhexidine.  Time Out: Verified patient identification, verified procedure, site/side was marked, verified correct patient position, special equipment/implants available, medications/allergies/relevent history reviewed, required imaging and test results available.  Performed  Access: RIGHT RADIAL Artery; 6 Fr Sheath -- Seldinger technique (Angiocath Micropuncture Kit)  IA Radial Cocktail, IV Heparin  4500 Units  Diagnostic Left Heart Catheterization with Coronary Angiography:  5 Fr TIG 4.0, Angled Pigtail catheters advanced and exchanged over Long Exchange Safety-J-wire into the ascending aorta and used for selective coronary artery engagement.  Left Coronary Artery Angiography: TIG 4.0  Right Coronary Artery Angiography: Not injected - known to be occluded.  LV Hemodynamics (LV Gram): Angled Pigtail  TR Band:  1205 Hours, 14 mL air  Hemodynamics:  Central Aortic / Mean Pressures: 106/74 mmHg; 91 mmHg; Final: 132/97 mmHg; 101 mmHg  Left Ventricular Pressures / EDP: 104/17 mmHg; 24 mmHg  Left Ventriculography:  EF: 40-45 %  Wall Motion: Severe basal to mid Inferior - inferolateral hypokinesis  Coronary Anatomy:  Left Main: large caliber, bifurcates into LAD & Circumflex. LAD: Large caliber, proximal ~40-50% tubular at SP1, tapers around the apex   D1: small caliber, proximal vessel, bifurcates; diffuse mild-moderate disease  D2: small caliber vessel from distal mid LAD, diffuse  mild-moderate disease Left Circumflex: 100% occluded just proximal to Atrial branch at the proximal stent edge; OM1 not visualized; occluded stent in OM2 noted; mild Diag-OM collaterals to OM2 noted.  Post PTCA - OM1 remains occluded as does OM1, but distal AV Groove Cx reveals patent LPL system & small (co-dominant) LPDA  The most severe stenosis is ~95% at the distal stent edge and the take-off of OM2.  RCA: Known to be occluded, not injected.    After reviewing the initial angiography, the culprit lesion was thought to be the 100% occluded early mid /proximal Circumflex.  Preparation were made to proceed with PCI on this lesion.  Original desire to use BMS was abandoned due to length of occlusion.  There is faint ~TIMI 1 filling of the distal vessel, suggesting  that the occlusion is Sub-chronic as opposed to Chronic.  Percutaneous Coronary Intervention:  Sheath exchanged for 6 Fr Guide: 6 Fr   XB 3.5 Guidewires: [place] (unable to cross); Whisper, Luge (Buddy wire technique to allow stent placement over Luge wire.  Predilation Balloon: Sprinter Legend  2.0 mm x 6 mm;   8 Atm x 30 Sec - 3 inflations distal to proximal  10 Atm x 30 Sec  Predilation Balloon: Sprinter Legend  2.5 mm x 15 mm;   10 Atm x 30 Sec @ mid vessel  -- AngioSculpt balloon used  12 Atm x 30 Sec x 4 inflations (distal to mid)  14 Atm x 40 Sec x 2 inflations mid to proximal  Predilation Balloon: AngioSculpt Scoring balloon 2.5 mm x 10 mm;   8 Atm x 50 Sec, 6 Atm x 30 Sec, -- unable to cross 99% lesion @ OM2 takeoff.  Predilation Balloon: [place] Euphora 2.5 mm x 12 mm;   14 Atm x 45 Sec x 3 inflations distal - mid (@ OM2 takeoff)  10 Atm x 10 Sec -- mid lesion @ OM1   Unable to cross Stent #1, so a larger diameter, shorter balloon was chosen  Predilation Balloon: [place] Euphora  2.75 mm x 8 mm;   12 Atm x 45 Sec x 2 inflations @ OM2 takeoff  14 Atm x 30 Sec at [employer] takeoff  Stent #1: Xience Alpine DES 2.75 mm x 15 mm;   12 Atm x 45 Sec,   Proximal edge inflation: 16 Atm x 30 Sec  Final Distal Edge Diameter: 2.9 mm  Stent #2: Xience Alpine DES 3.5 mm x 28 mm (attempted 23 mm - not long enough).  Deployed @: 14 Atm x 45 Sec  Final Diameter: 3.7 mm  Post-dilation Balloon: [place] 3.0 mm x 15 mm   14 Atm x 50 Sec -- Final Diameter: 3.1 mm  Post-dilation Balloon:  Euphora 3.75 mm x 15 mm;   10 Atm x 45 Sec @ stent overlap into proximal Stent #1 (Diameter ~3.7 mm)  14 Atm x 45 Sec x 1 - distal Stent #2 & overlap  16 Atm x 45 Sec x 2 - distal & proximal Stent #2  Final Diameter: @ overlap 3.7 mm to 3.8 mm at proximal stent edge in proximal Circumflex  Post deployment angiography in multiple views, with and without guidewire in place revealed excellent stent deployment and lesion coverage.  There was no evidence of dissection or perforation.  OM2 remains occluded with L-L collaterals from  diagonals; OM1 with faint filling only. Brisk TIMI 3 flow into LPL & LPDA.  MEDICATIONS:  Anesthesia:  Local Lidocaine 2 ml  Sedation:  4 mg IV Versed, 4 mcg IV Morphine;   Omnipaque Contrast: 215 ml  Anticoagulation:  IV Heparin 4500 Units ; Angiomax Bolus & drip  Anti-Platelet Agent:  Plavix 600 mg  IC NTG 200 mcg x 3 Radial Cocktail: 5 mg Verapamil, 400 mcg NTG, 2 ml 2% Lidocaine in 10 ml NS - 10 ml  PATIENT DISPOSITION:    The patient was transferred to the PACU holding area in a hemodynamicaly stable, chest pain free condition.  The patient tolerated the procedure well, and there were no complications.  EBL:   < 20 ml  The patient was stable before, during, and after the procedure.  POST-OPERATIVE DIAGNOSIS:    Severe 2+ vessel CAD with known RCA & OM2 occlusion & now early mid-proximal Circumflex occlusion with the  most severe lesion being @ the take-off of the occluded OM2.  Successful revascularization of the entire prox-mid Circumflex 100% ISR with 2 overlapping Xience Alpine DES - 2.75 mm x 15 mm (distal - beyond OM2 - post-dilated to 3.0 mm) & 3.5 mm x 28 mm proximal to mid (overlapping) -- post dilated to 3.8 mm.  Moderately reduced LVEF with severely elevated LVEDP of ~ 25 mmHg  PLAN OF CARE:  Return to TCU for post radial cath care.  Continue Angiomax x 2 hrs at reduced rate  DAPT for minimum of 3 months, then can stop ASA  May require diuresis.   Leonie Man, M.D., M.S. [facility] [address] [place], [place]  [address]  [phone]  08/23/2013 12:04 PM

## 2013-08-23 NOTE — Progress Notes (Signed)
[place] for heparin>>lovenox post cath Indication: NSTEMI, history of recent PE 08/03/13  Labs:  Recent Labs  08/20/13 1545  08/21/13 0025  08/21/13 0204  08/21/13 2245 08/22/13 0314 08/22/13 1400 08/22/13 1940 08/23/13 0333  HGB 13.8  --   --   --  12.3*  --   --  12.2*  --   --  11.3*  HCT 41.1  --   --   --  37.2*  --   --  36.2*  --   --  34.1*  PLT 176  --   --   --  158  --   --  153  --   --  144*  LABPROT  --   --   --   --   --   --   --   --   --   --  13.4  INR  --   --   --   --   --   --   --   --   --   --  1.04  HEPARINUNFRC  --   --   --   < >  --   < > 0.54 0.45  --   --  0.51  CREATININE 1.31  --   --   --  1.18  --   --   --   --   --  1.29  TROPONINI <0.30  < > 8.47*  --   --   --   --   --  4.50* 5.69*  --   < > = values in this interval not displayed.   Assessment:  58yo male now s/p cath with PCI. Patient was recently diagnosed with PE late last month and has been receiving sq lovenox shots daily, transitioned to IV heparin for cath today will now transition back to lovenox. Given procedure this morning will give 1mg /kg dose tonight ~6h post TR band removal then plan to start 1.5mg /kg/day dosing tomorrow morning. No bleeding or hematoma noted post cath, hgb down this morning so will continue to follow. Orders to continue lovenox with DAPT therapy at least 3 months before possibly dropping asa.   Plan: 1. Give lovenox 80mg  tonight post cath 2. Start 1.5mg /kg/day dosing tomorrow (home dose was 140mg  daily) 3. CBC already ordered for 4/15  Erin Hearing PharmD., BCPS Clinical Pharmacist Pager [phone] 08/23/2013 2:20 PM

## 2013-08-24 DIAGNOSIS — I5030 Unspecified diastolic (congestive) heart failure: Secondary | ICD-10-CM

## 2013-08-24 DIAGNOSIS — I509 Heart failure, unspecified: Secondary | ICD-10-CM

## 2013-08-24 LAB — BASIC METABOLIC PANEL
BUN: 10 mg/dL (ref 6–23)
CO2: 23 meq/L (ref 19–32)
Calcium: 8.9 mg/dL (ref 8.4–10.5)
Chloride: 100 mEq/L (ref 96–112)
Creatinine, Ser: 1.18 mg/dL (ref 0.50–1.35)
GFR calc Af Amer: 77 mL/min — ABNORMAL LOW (ref >90)
GFR, EST NON AFRICAN AMERICAN: 67 mL/min — AB (ref >90)
GLUCOSE: 117 mg/dL — AB (ref 70–99)
POTASSIUM: 3.8 meq/L (ref 3.7–5.3)
SODIUM: 138 meq/L (ref 137–147)

## 2013-08-24 LAB — CBC
HCT: 32.1 % — ABNORMAL LOW (ref 39.0–52.0)
HEMOGLOBIN: 10.8 g/dL — AB (ref 13.0–17.0)
MCH: 31.7 pg (ref 26.0–34.0)
MCHC: 33.6 g/dL (ref 30.0–36.0)
MCV: 94.1 fL (ref 78.0–100.0)
Platelets: 149 10*3/uL — ABNORMAL LOW (ref 150–400)
RBC: 3.41 MIL/uL — AB (ref 4.22–5.81)
RDW: 15.1 % (ref 11.5–15.5)
WBC: 4.9 10*3/uL (ref 4.0–10.5)

## 2013-08-24 LAB — GLUCOSE, CAPILLARY
GLUCOSE-CAPILLARY: 128 mg/dL — AB (ref 70–99)
GLUCOSE-CAPILLARY: 145 mg/dL — AB (ref 70–99)
Glucose-Capillary: 146 mg/dL — ABNORMAL HIGH (ref 70–99)
Glucose-Capillary: 120 mg/dL — ABNORMAL HIGH (ref 70–99)

## 2013-08-24 MED ORDER — LEVALBUTEROL HCL 0.63 MG/3ML IN NEBU
0.6300 mg | INHALATION_SOLUTION | RESPIRATORY_TRACT | Status: DC | PRN
  Administered 2013-08-24: 0.63 mg via RESPIRATORY_TRACT
  Filled 2013-08-24: qty 3

## 2013-08-24 MED ORDER — HYDROMORPHONE HCL PF 1 MG/ML IJ SOLN: 1.0000 mg | INTRAMUSCULAR | Status: DC | PRN

## 2013-08-24 MED ORDER — METOPROLOL TARTRATE 50 MG PO TABS
50.0000 mg | ORAL_TABLET | Freq: Two times a day (BID) | ORAL | Status: DC
  Administered 2013-08-24 – 2013-08-25 (×2): 50 mg via ORAL
  Filled 2013-08-24 (×3): qty 1

## 2013-08-24 MED ORDER — METOPROLOL TARTRATE 25 MG PO TABS
25.0000 mg | ORAL_TABLET | Freq: Once | ORAL | Status: AC
  Administered 2013-08-24: 25 mg via ORAL

## 2013-08-24 MED ORDER — RANOLAZINE ER 500 MG PO TB12
500.0000 mg | ORAL_TABLET | Freq: Two times a day (BID) | ORAL | Status: DC
  Administered 2013-08-24 – 2013-08-25 (×3): 500 mg via ORAL
  Filled 2013-08-24 (×4): qty 1

## 2013-08-24 MED FILL — Sodium Chloride IV Soln 0.9%: INTRAVENOUS | Qty: 50 | Status: AC

## 2013-08-24 NOTE — Progress Notes (Signed)
CARDIAC REHAB PHASE I   PRE:  Rate/Rhythm: 91 SR    BP: sitting 102/56 manual    SaO2: 93 2 1/2 L  MODE:  Ambulation: 250 ft, applied 3L O2 then 350 ft   POST:  Rate/Rhythm: 115 ST    BP: sitting 130/80     SaO2: 91 3L  Pt didn't want to walk on O2, took off. Quick pace and pt quickly became DOE. SaO2 down to 79 RA at the lowest. Return to room for O2 and rest. SaO2 increased to 93 3L. Pt then able to walk again, 350 ft on 3L. Some SOB with wheezing but SaO2 maintained 90-92 3L. After walk left pt off O2 at rest and SaO2 93 RA after 10 min. However pt moved to bed and SaO2 quickly dropped to 80 RA with this minimal exertion. Discussed with pt the need for him to use O2 when moving here and at home. Voiced understanding. Discussed MI, stent, NTG, CRPII and/or Pulm Rehab. Encouraged pt to increase activity as able and to call Cardiac or Pulm rehab when he was ready.  [place], ACSM 08/24/2013 11:57 AM

## 2013-08-24 NOTE — Progress Notes (Signed)
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 The patient described the chest pain as burning, sometimes radiating towards the neck, worsens intermittently. Patient reported that he had "heart attack" twice and this is not "his heart", no worsening chest pain with deep breathing or coughing.   Patient had recent echocardiogram done in March 2015 which showed EF 55-60% with grade 1 diastolic dysfunction. EKG did not show acute ST-T wave changes c/w ischemia, troponin x1 was negative.  Patient has a known history of radiation-induced esophagitis and moderate to severe esophageal stricture, has been having serial dilatations, currently scheduled to have another dilation, last done on 07/26/13 by Dr Deatra Ina   After admission his TNI bumped- initially subtly to 0.45 but 3rd reading bumped to 8.47. Cardiology was consulted and recommended tx to [facility] for possible catheterization.  HPI/Subjective: Wants to go home. Says able to swallow eggs better than lumpy grits. Denied SOB at rest.  No new complaints  Assessment/Plan:    CAD/NSTEMI / chest pain / 2V CAD post PCI and stents -per Cards  -known dz RCA and OM2 - post revasc  Cfx and OM2 -Plavix x at least 3 months -DC home when cleared by Cardiology    Atrial fibrillation with RVR -had brief run this admit and developed hypotension with Cardizem gtt -cont maintain NSR -suspect volume depletion in setting of severe esophagitis contributed -cont BB - Cards titrating dose -cont Lovenox after dc    Pericardial  effusion -ECHO demonstrated trivial effusion-follow prn    Acute on chronic respiratory failure with hypoxia:   A) Severe chronic obstructive pulmonary disease   B) Small cell carcinoma of lung   C) Chronic diastolic CHF (congestive heart failure), NYHA class 1 -intermittently on 2L at home-currently stable on RA at rest but desatted to 70s with ambulation so may need more continuous oxygen at home -was scheduled to start prophylactic radiation rxn to brain 4/14 but now on hold -no evidence of decompensated HF on exam     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 -last dilatation 07/26/13 -pt endorsed prior SLP eval and plans for add'l dilatation next week -cont soft diet     Diabetes mellitus, type 2 -cont SSI -diet controlled at home -HgbA1c 5.6 - was 8.4 in December and likely reflective of porr intake from esophagitis    HYPERTENSION, UNSPECIFIED -BP soft    Protein-calorie malnutrition, severe -see above    Submassive pulmonary embolism -dx'd March 25,2015 -Lovenox per pharmacy - will need to dc on this since malignancy related embolism - was on daily dosing at home (high dose) which should suffice for PE tx and be more cost effective than BID dosing - pt sts was being followed by Dr. Julien Nordmann pre admit and has doses left at home -MD d/w pt rationale for cont'd anti coagulation and also clarified difference between CT interpretation of pericardial effusion and more recent ECHO which better clarified the effusion  DVT prophylaxis: lovenox Code Status: Full Family Communication: No family present at time of exam Disposition Plan/Expected LOS: Transfer  to Telemetry - possible discharge home in am  Consultants: Cardiology  Procedures: 2-D echocardiogram  - Left ventricle: The cavity size was mildly dilated. Wall thickness was normal. Systolic function was normal. The estimated ejection fraction was in the range of 60% to 65%. Wall motion was normal;  there were no regional wall motion abnormalities. There was an increased relative contribution of atrial contraction to ventricular filling. - Pericardium, extracardiac: A trivial pericardial effusion was identified. There was no evidence of hemodynamic Compromise.  Left heart cath Severe 2+ vessel CAD with known RCA & OM2 occlusion & now early mid-proximal Circumflex occlusion with the most severe lesion being @ the take-off of the occluded OM2.  Successful revascularization of the entire prox-mid Circumflex 100% ISR with 2 overlapping Xience Alpine DES - 2.75 mm x 15 mm (distal - beyond OM2 - post-dilated to 3.0 mm) & 3.5 mm x 28 mm proximal to mid (overlapping) -- post dilated to 3.8 mm.  Moderately reduced LVEF with severely elevated LVEDP of ~ 25 mmHg  Antibiotics: None  Objective: Blood pressure 100/69, pulse 106, temperature 98.2 F (36.8 C), temperature source Oral, resp. rate 20, height 5\' 10"  (1.778 m), weight 191 lb 9.3 oz (86.9 kg), SpO2 98.00%.  Intake/Output Summary (Last 24 hours) at 08/24/13 1340 Last data filed at 08/24/13 1200  Gross per 24 hour  Intake 590.47 ml  Output   3500 ml  Net -2909.53 ml   Exam: General: No acute respiratory distress Lungs: Clear to auscultation bilaterally without wheezes or crackles, RA Cardiovascular: Regular rate and rhythm at time of exam today Abdomen: Nontender, nondistended, soft, bowel sounds positive, no rebound, no ascites, no appreciable mass Musculoskeletal: No significant cyanosis, clubbing of bilateral lower extremities Neurological: Alert and oriented x 3, moves all extremities x 4 without focal neurological deficits, CN 2-12 intact  Scheduled Meds:  Scheduled Meds: . aspirin EC  81 mg Oral Daily  . atorvastatin  40 mg Oral q1800  . Chlorhexidine Gluconate Cloth  6 each Topical Q0600  . clopidogrel  75 mg Oral Q breakfast  . enoxaparin (LOVENOX) injection  1.5 mg/kg Subcutaneous Q24H  . feeding supplement (ENSURE)   1 Container Oral TID BM  . feeding supplement (RESOURCE BREEZE)  1 Container Oral TID BM  . insulin aspart  0-5 Units Subcutaneous QHS  . insulin aspart  0-9 Units Subcutaneous TID WC  . isosorbide mononitrate  60 mg Oral q morning - 10a  . metoprolol tartrate  50 mg Oral BID  . metoprolol tartrate  25 mg Oral Once  . mupirocin ointment  1 application Nasal BID  . pantoprazole sodium  40 mg Oral BID  . ranolazine  500 mg Oral BID   Data Reviewed: Basic Metabolic Panel:  Recent Labs Lab 08/20/13 1545 08/21/13 0204 08/23/13 0333 08/24/13 0245  NA 136* 136* 139 138  K 4.3 4.8 4.6 3.8  CL 96 100 99 100  CO2 25 26 26 23   GLUCOSE 230* 136* 106* 117*  BUN 20 19 14 10   CREATININE 1.31 1.18 1.29 1.18  CALCIUM 9.7 9.1 9.4 8.9   Liver Function Tests: No results found for this basename: AST, ALT, ALKPHOS, BILITOT, PROT, ALBUMIN,  in the last 168 hours  CBC:  Recent Labs Lab 08/20/13 1545 08/21/13 0204 08/22/13 0314 08/23/13 0333 08/24/13 0245  WBC 6.3 4.6 6.5 6.0 4.9  NEUTROABS 5.0  --   --   --   --   HGB 13.8 12.3* 12.2* 11.3* 10.8*  HCT 41.1 37.2* 36.2* 34.1* 32.1*  MCV 93.6 94.7 94.8 94.7 94.1  PLT 176 158 153 144* 149*   Cardiac Enzymes:  Recent Labs Lab 08/20/13 1545 08/20/13 1800 08/21/13 0025 08/22/13 1400 08/22/13 1940  TROPONINI <0.30 0.45* 8.47* 4.50* 5.69*   BNP (last 3 results)  Recent Labs  04/28/13 1430 08/03/13 1130 08/20/13 1811  PROBNP 678.8* 2537.0* 1986.0*    Recent Results (from the past 240 hour(s))  MRSA PCR SCREENING     Status: Abnormal   Collection Time    08/20/13  9:20 PM      Result Value Ref Range Status   MRSA by PCR POSITIVE (*) NEGATIVE Corrected   Comment: RESULT CALLED TO, READ BACK BY AND VERIFIED WITH:     SPOKE WITH REEVES,M RN [phone] [id number] COVINGTON,N     CORRECTED RESULTS CALLED TO:     REEVES,M RN [phone] COVINGTON,N     CORRECTED ON 04/12 AT 0429: PREVIOUSLY REPORTED AS POSITIVE        The GeneXpert MRSA  Assay (FDA approved for NASAL specimens only), is one component of a comprehensive MRSA colonization surveillance program. It is not intended to diagnose MRSA infection      nor to guide or monitor treatment for MRSA infections. INVALID RESULTS, SPECIMEN SENT FOR CULTURE SPOKE WITH REEVES,M RN [phone] COVINGTON,N     Studies:  Recent x-ray studies have been reviewed in detail by the Attending Physician       Erin Hearing, ANP [facility] Office  [phone] Pager [phone]  **If unable to reach the above provider after paging please contact the [place] @ [phone]  On-Call/Text Page:      [email or web address]      password [email or web address]  If 7PM-7AM, please contact night-coverage [email or web address] Password [email or web address] 08/24/2013, 1:40 PM   LOS: 4 days   I have personally examined this patient and reviewed the entire database. I have reviewed the above note, made any necessary editorial changes, and agree with its content.  Cherene Altes, MD [facility]

## 2013-08-24 NOTE — Progress Notes (Addendum)
Subjective:  No recurrent chest pain; s/p PCI to LCX      Objective:   Vital Signs in the last 24 hours: Temp:  [97.6 F (36.4 C)-98.5 F (36.9 C)] 97.6 F (36.4 C) (04/15 0729) Pulse Rate:  [97-115] 106 (04/15 1000) Resp:  [15-26] 20 (04/15 1000) BP: (99-129)/(60-96) 129/87 mmHg (04/15 0800) SpO2:  [89 %-100 %] 97 % (04/15 1000)  Intake/Output from previous day: 04/14 0701 - 04/15 0700 In: 460.9 [P.O.:100; I.V.:360.9] Out: 3000 [Urine:3000]  Medications: . aspirin EC  81 mg Oral Daily  . atorvastatin  40 mg Oral q1800  . Chlorhexidine Gluconate Cloth  6 each Topical Q0600  . clopidogrel  75 mg Oral Q breakfast  . enoxaparin (LOVENOX) injection  1.5 mg/kg Subcutaneous Q24H  . enoxaparin (LOVENOX) injection  80 mg Subcutaneous Once  . feeding supplement (ENSURE)  1 Container Oral TID BM  . feeding supplement (RESOURCE BREEZE)  1 Container Oral TID BM  . insulin aspart  0-5 Units Subcutaneous QHS  . insulin aspart  0-9 Units Subcutaneous TID WC  . isosorbide mononitrate  60 mg Oral q morning - 10a  . metoprolol tartrate  25 mg Oral BID  . mupirocin ointment  1 application Nasal BID  . pantoprazole sodium  40 mg Oral BID    . nitroGLYCERIN Stopped (08/21/13 1900)    Physical Exam:   General appearance: alert, cooperative and no distress Neck: no adenopathy, no JVD, supple, symmetrical, trachea midline and thyroid not enlarged, symmetric, no tenderness/mass/nodules Lungs: clear to auscultation bilaterally and no rub Heart: regular rate and rhythm, no rub and 1/6 sem Abdomen: soft, non-tender; bowel sounds normal; no masses,  no organomegaly Extremities: extremities normal, atraumatic, no cyanosis or edema Pulses: 2+ and symmetric Neurologic: Grossly normal   Rate: 98  Rhythm: normal sinus rhythm  ECG today: ST at 101; no acute ST changes  Lab Results:    Recent Labs  08/23/13 0333 08/24/13 0245  NA 139 138  K 4.6 3.8  CL 99 100  CO2 26 23  GLUCOSE  106* 117*  BUN 14 10  CREATININE 1.29 1.18   CBC    Component Value Date/Time   WBC 4.9 08/24/2013 0245   WBC 6.8 06/07/2013 0933   RBC 3.41* 08/24/2013 0245   RBC 2.95* 06/07/2013 0933   HGB 10.8* 08/24/2013 0245   HGB 9.0* 06/07/2013 0933   HCT 32.1* 08/24/2013 0245   HCT 26.9* 06/07/2013 0933   PLT 149* 08/24/2013 0245   PLT 76* 06/07/2013 0933   MCV 94.1 08/24/2013 0245   MCV 91.2 06/07/2013 0933   MCH 31.7 08/24/2013 0245   MCH 30.5 06/07/2013 0933   MCHC 33.6 08/24/2013 0245   MCHC 33.5 06/07/2013 0933   RDW 15.1 08/24/2013 0245   RDW 19.1* 06/07/2013 0933   LYMPHSABS 0.7 08/20/2013 1545   LYMPHSABS 0.5* 06/07/2013 0933   MONOABS 0.5 08/20/2013 1545   MONOABS 0.7 06/07/2013 0933   EOSABS 0.1 08/20/2013 1545   EOSABS 0.0 06/07/2013 0933   BASOSABS 0.0 08/20/2013 1545   BASOSABS 0.0 06/07/2013 0933     Recent Labs  08/22/13 1400 08/22/13 1940  TROPONINI 4.50* 5.69*   Hepatic Function Panel No results found for this basename: PROT, ALBUMIN, AST, ALT, ALKPHOS, BILITOT, BILIDIR, IBILI,  in the last 72 hours  Recent Labs  08/23/13 0333  INR 1.04   BNP (last 3 results)  Recent Labs  04/28/13 1430 08/03/13 1130 08/20/13 1811  PROBNP 678.8*  2537.0* 1986.0*    Lipid Panel     Component Value Date/Time   CHOL 133 08/22/2013 0314   TRIG 105 08/22/2013 0314   HDL 43 08/22/2013 0314   CHOLHDL 3.1 08/22/2013 0314   VLDL 21 08/22/2013 0314   LDLCALC 69 08/22/2013 0314      Imaging:  No results found.    Assessment/Plan:   Active Problems:   Diabetes mellitus, type 2   HYPERTENSION, UNSPECIFIED   CAD, NATIVE VESSEL   Severe chronic obstructive pulmonary disease   Small cell carcinoma of lung   Atrial fibrillation with RVR   Protein-calorie malnutrition, severe   Dysphagia, unspecified(787.20)   Stricture and stenosis of esophagus   Radiation esophagitis   Submassive pulmonary embolism   Acute on chronic respiratory failure with hypoxia   Atypical chest pain    NSTEMI (non-ST elevated myocardial infarction)   Chronic diastolic CHF (congestive heart failure), NYHA class 1   Pericardial effusion  Day 1 s/p PCI to LCX, but with total RCA and OM2 occlusion.  Oxygen desaturation with walking today from >90% to 79% and HR 95 to 115.  Will increase lopressor 50 mg bid. Add ranolazine 500 mg bid with total occlusions. On sq lovenox 1.5 mg/k q 24 hrs. Trivial pericardial effusion on follow-up cath. F/u labs in am.    Troy Sine, MD, [facility] 08/24/2013, 11:00 AM

## 2013-08-25 LAB — BASIC METABOLIC PANEL
BUN: 9 mg/dL (ref 6–23)
CO2: 24 mEq/L (ref 19–32)
Calcium: 9.3 mg/dL (ref 8.4–10.5)
Chloride: 102 mEq/L (ref 96–112)
Creatinine, Ser: 1.27 mg/dL (ref 0.50–1.35)
GFR calc non Af Amer: 61 mL/min — ABNORMAL LOW (ref >90)
GFR, EST AFRICAN AMERICAN: 71 mL/min — AB (ref >90)
Glucose, Bld: 110 mg/dL — ABNORMAL HIGH (ref 70–99)
Potassium: 3.9 mEq/L (ref 3.7–5.3)
Sodium: 140 mEq/L (ref 137–147)

## 2013-08-25 LAB — GLUCOSE, CAPILLARY
GLUCOSE-CAPILLARY: 107 mg/dL — AB (ref 70–99)
Glucose-Capillary: 158 mg/dL — ABNORMAL HIGH (ref 70–99)

## 2013-08-25 LAB — CBC
HCT: 32.7 % — ABNORMAL LOW (ref 39.0–52.0)
Hemoglobin: 10.9 g/dL — ABNORMAL LOW (ref 13.0–17.0)
MCH: 31.3 pg (ref 26.0–34.0)
MCHC: 33.3 g/dL (ref 30.0–36.0)
MCV: 94 fL (ref 78.0–100.0)
Platelets: 139 10*3/uL — ABNORMAL LOW (ref 150–400)
RBC: 3.48 MIL/uL — ABNORMAL LOW (ref 4.22–5.81)
RDW: 15.1 % (ref 11.5–15.5)
WBC: 4.9 10*3/uL (ref 4.0–10.5)

## 2013-08-25 MED ORDER — CLOPIDOGREL BISULFATE 75 MG PO TABS: 75.0000 mg | ORAL_TABLET | Freq: Every day | ORAL | Status: DC

## 2013-08-25 MED ORDER — ASPIRIN 81 MG PO TBEC: 81.0000 mg | DELAYED_RELEASE_TABLET | Freq: Every day | ORAL | Status: DC

## 2013-08-25 MED ORDER — RANOLAZINE ER 500 MG PO TB12: 500.0000 mg | ORAL_TABLET | Freq: Two times a day (BID) | ORAL | Status: DC

## 2013-08-25 MED ORDER — BOOST / RESOURCE BREEZE PO LIQD: 1.0000 | Freq: Three times a day (TID) | ORAL | Status: DC

## 2013-08-25 MED ORDER — ENSURE PUDDING PO PUDG: 1.0000 | Freq: Three times a day (TID) | ORAL | Status: DC

## 2013-08-25 MED ORDER — METOPROLOL TARTRATE 50 MG PO TABS: 50.0000 mg | ORAL_TABLET | Freq: Two times a day (BID) | ORAL | Status: DC

## 2013-08-25 NOTE — Discharge Summary (Signed)
Physician Discharge Summary  [name] ZOX:[id number] DOB: [birth year] DOA: 08/20/2013  PCP: [name], MD  Admit date: 08/20/2013 Discharge date: 08/25/2013  Time spent: 30 minutes  Recommendations for Outpatient Follow-up:  1. Follow up with your PCP in 1-2 weeks after discharge or keep previously scheduled appointment 2. Follow up with Dr. Aundra Dubin after discharge- call his office if they have not called with appointment within 3 days after discharge 3. Keep all prior specialty provider appointments specific to your cancer  Discharge Diagnoses:  Active Problems:   CAD, NATIVE VESSEL   Atrial fibrillation with RVR-maintain NSR   Atypical chest pain/NSTEMI (non-ST elevated myocardial infarction) post stent x 2   Pericardial effusion/trivial per ECHO   Severe chronic obstructive pulmonary disease   Small cell carcinoma of lung   Dysphagia/Stricture and stenosis of esophagus/Radiation esophagitis   Acute on chronic respiratory failure with hypoxia on home oxygen   Diabetes mellitus, type 2   HYPERTENSION   Protein-calorie malnutrition, severe   Submassive pulmonary embolism on home Lovenox pre admit   Chronic diastolic CHF (congestive heart failure), NYHA class 1-compensated   Discharge Condition: stable  Diet recommendation: Heart Healthy/Carbohydrate modified  Filed Weights   08/20/13 2030 08/23/13 0342 08/24/13 1439  Weight: 192 lb 10.9 oz (87.4 kg) 191 lb 9.3 oz (86.9 kg) 193 lb (87.544 kg)    History of present illness:  58 year old WM PMHx small cell lung CA dx 2014 s/p XRT and chemotherapy, COPD gold stage 2 on intermittent 2L home oxygen, CAD s/p 3 MIs and multiple stents, T2DM, CKD stage 3, pulmonary embolism on therapeutic Lovenox at home presented with chest pain that started at lunchtime on day of admit. The patient described the chest pain as burning, sometimes radiating towards the neck, worsens intermittently. Patient reported that he had "heart  attack" twice and this is not "his heart", no worsening chest pain with deep breathing or coughing.   Patient had recent echocardiogram done in March 2015 which showed EF 55-60% with grade 1 diastolic dysfunction. EKG did not show acute ST-T wave changes c/w ischemia, troponin x1 was negative.  Patient has a known history of radiation-induced esophagitis and moderate to severe esophageal stricture, has been having serial dilatations, currently scheduled to have another dilation, last done on 07/26/13 by Dr Deatra Ina   After admission his TNI bumped- initially subtly to 0.45 but 3rd reading bumped to 8.47. Cardiology was consulted and recommended tx to [facility] for possible catheterization.   Hospital Course:  CAD/NSTEMI / chest pain / 2V CAD post PCI and stents  -per Cards  -known dz RCA and OM2 - post revasc Cfx and OM2  -Plavix x at least 3 months  -new start Ranexa this admit- Lopressor dose increased as well -to FU with Dr. Aundra Dubin after dc  Atrial fibrillation with RVR  -had brief run this admit and developed hypotension with Cardizem gtt  -cont maintain NSR  -suspect volume depletion in setting of severe esophagitis contributed  -cont BB - Cards titrating dose  -cont Lovenox after dc as per recommendation of oncology/Dr. Julien Nordmann  Pericardial effusion  -ECHO demonstrated trivial effusion-follow prn   Acute on chronic respiratory failure with hypoxia:  A) Severe chronic obstructive pulmonary disease  B) Small cell carcinoma of lung  C) Chronic diastolic CHF (congestive heart failure), NYHA class 1  -intermittently on 2L at home-currently stable on RA at rest but desatted to 70s with ambulation-pt instructed in importance of using O2 with ambulation esp  in regards to CAD -was scheduled to start prophylactic radiation rxn to brain 4/14 -to reschedule appointment for after dc -no evidence of decompensated HF on exam   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  -last  dilatation 07/26/13  -pt endorsed prior SLP eval and plans for add'l dilatation next week  -cont soft diet and liquid/pudding supplements  Diabetes mellitus, type 2  -cont SSI  -diet controlled at home  -HgbA1c 5.6 - was 8.4 in December and likely reflective of porr intake from esophagitis   HYPERTENSION, UNSPECIFIED  -BP soft   Protein-calorie malnutrition, severe  -see above   Submassive pulmonary embolism  -dx'd March 25,2015  -Lovenox per pharmacy - will need to dc on this since malignancy related embolism - was on daily dosing at home (high dose) which should suffice for PE tx and be more cost effective than BID dosing - pt sts was being followed by Dr. Julien Nordmann pre admit and has doses left at home  -MD d/w pt rationale for cont'd anti coagulation and also clarified difference between CT interpretation of pericardial effusion and more recent ECHO which better clarified the effusion   Procedures: 2-D echocardiogram  - Left ventricle: The cavity size was mildly dilated. Wall thickness was normal. Systolic function was normal. The estimated ejection fraction was in the range of 60% to 65%. Wall motion was normal; there were no regional wall motion abnormalities. There was an increased relative contribution of atrial contraction to ventricular filling. - Pericardium, extracardiac: A trivial pericardial effusion was identified. There was no evidence of hemodynamic Compromise.   Left heart cath  Severe 2+ vessel CAD with known RCA & OM2 occlusion & now early mid-proximal Circumflex occlusion with the most severe lesion being @ the take-off of the occluded OM2.  Successful revascularization of the entire prox-mid Circumflex 100% ISR with 2 overlapping Xience Alpine DES - 2.75 mm x 15 mm (distal - beyond OM2 - post-dilated to 3.0 mm) & 3.5 mm x 28 mm proximal to mid (overlapping) -- post dilated to 3.8 mm.  Moderately reduced LVEF with severely elevated LVEDP of ~ 25  mmHg   Consultations:  Cardiology  Discharge Exam: Filed Vitals:   08/25/13 0500  BP: 111/80  Pulse: 89  Temp: 98.1 F (36.7 C)  Resp: 16   General: No acute respiratory distress  Lungs: Clear to auscultation bilaterally without wheezes or crackles, RA  Cardiovascular: Regular rate and rhythm at time of exam today  Abdomen: Nontender, nondistended, soft, bowel sounds positive, no rebound, no ascites, no appreciable mass  Musculoskeletal: No significant cyanosis, clubbing of bilateral lower extremities  Neurological: Alert and oriented x 3, moves all extremities x 4 without focal neurological deficits, CN 2-12 intact   Discharge Instructions You were cared for by a hospitalist during your hospital stay. If you have any questions about your discharge medications or the care you received while you were in the hospital after you are discharged, you can call the unit and asked to speak with the hospitalist on call if the hospitalist that took care of you is not available. Once you are discharged, your primary care physician will handle any further medical issues. Please note that NO REFILLS for any discharge medications will be authorized once you are discharged, as it is imperative that you return to your primary care physician (or establish a relationship with a primary care physician if you do not have one) for your aftercare needs so that they can reassess your  need for medications and monitor your lab values.      Discharge Orders   Future Appointments Provider Department Dept Phone   10/17/2013 8:30 AM [facility] [phone]   10/17/2013 9:30 AM [facility] [phone]   Liquids only 4 hours prior to your exam. Any medications can be taken as usual. Please arrive 15 min prior to your scheduled exam time.   10/24/2013 9:00 AM Curt Bears, MD [facility] [phone]    Future Orders Complete By Expires   Call MD for:  extreme fatigue  As directed    Call MD for:  persistant dizziness or light-headedness  As directed    Call MD for:  temperature >100.4  As directed    Diet - low sodium heart healthy  As directed    Diet Carb Modified  As directed    Increase activity slowly  As directed        Medication List         albuterol (2.5 MG/3ML) 0.083% nebulizer solution  Commonly known as:  PROVENTIL  Take 2.5 mg by nebulization every 6 (six) hours as needed for wheezing or shortness of breath.     albuterol 108 (90 BASE) MCG/ACT inhaler  Commonly known as:  PROVENTIL HFA;VENTOLIN HFA  Inhale into the lungs every 6 (six) hours as needed for wheezing or shortness of breath.     aspirin 81 MG EC tablet  Take 1 tablet (81 mg total) by mouth daily.     atorvastatin 40 MG tablet  Commonly known as:  LIPITOR  Take 40 mg by mouth daily.     clopidogrel 75 MG tablet  Commonly known as:  PLAVIX  Take 1 tablet (75 mg total) by mouth daily with breakfast.     enoxaparin 150 MG/ML injection  Commonly known as:  LOVENOX  Inject 0.93 mLs (140 mg total) into the skin daily.     feeding supplement (ENSURE) Pudg  Take 1 Container by mouth 3 (three) times daily between meals.     feeding supplement (RESOURCE BREEZE) Liqd  Take 1 Container by mouth 3 (three) times daily between meals. OR EQUIVALENT AVAILABLE AT [facility]/[employer]/DRUG STORE     HYDROcodone-acetaminophen 7.5-325 mg/15 ml solution  Commonly known as:  HYCET  Take 10 mLs by mouth 4 (four) times daily as needed for moderate pain.     isosorbide mononitrate 60 MG 24 hr tablet  Commonly known as:  IMDUR  Take 60 mg by mouth every morning.     magic mouthwash w/lidocaine Soln  Take 5 mLs by mouth 4 (four) times daily as needed for mouth pain.     metoprolol 50 MG tablet  Commonly known as:  LOPRESSOR  Take 1 tablet (50 mg total) by mouth 2 (two) times daily.     nitroGLYCERIN 0.4 MG SL  tablet  Commonly known as:  NITROSTAT  Place 0.4 mg under the tongue every 5 (five) minutes as needed for chest pain. May repeat up to 3 doses     pantoprazole 40 MG tablet  Commonly known as:  PROTONIX  Take 40 mg by mouth 2 (two) times daily.     ranolazine 500 MG 12 hr tablet  Commonly known as:  RANEXA  Take 1 tablet (500 mg total) by mouth 2 (two) times daily.       Allergies  Allergen Reactions  . Azithromycin  Reaction while in hospital - pt doesn't know reaction  . Erythromycin Other (See Comments)    Reaction while in hospital - pt doesn't know reaction  . Fentanyl Other (See Comments)    Delirium and violent  . Penicillins Other (See Comments)    Childhood reaction - unknown   Follow-up Information   Follow up with [name], MD. (Keep previously scheduled appointments or call to be seen in 1-2 weeks for routine post hospital care)    Specialty:  Family Medicine   Contact information:   [address] [place] [address] [phone]       Follow up with Loralie Champagne, MD. (Call if his office has not called you with follow up appointment within 3 days after discharge)    Specialty:  Cardiology   Contact information:   [address] [place] [address] [phone]        The results of significant diagnostics from this hospitalization (including imaging, microbiology, ancillary and laboratory) are listed below for reference.    Significant Diagnostic Studies: Dg Chest 2 View  08/20/2013   CLINICAL DATA:  Chest pain and shortness of breath for 1 day  EXAM: CHEST  2 VIEW  COMPARISON:  CT CHEST W/CM dated 08/03/2013; DG CHEST 1V PORT dated 04/29/2013  FINDINGS: Mild cardiac enlargement is stable. Vascular pattern is normal. There is an irregular 1.5 cm opacity laterally in the right lower lobe that was not present previously. There is mild diffuse interstitial change bilaterally, mildly worse on the left. There is  and mild increased in severity of this process, some of which if not all may be related to technique. The findings do not appear consistent with pulmonary edema. There are no pleural effusions. There is a 1 cm oval lytic area in the lateral right sixth rib.  IMPRESSION: Irregular right lower lobe opacity concerning for pulmonary nodule. CT thorax recommended to further evaluate.  Mild diffuse interstitial change left lung greater than right. Significance is uncertain. The finding does not appear to be related to pulmonary edema. Atypical pneumonia is a consideration. Chronic interstitial lung disease is another consideration, as would be lymphangitic metastasis.  Evidence of lytic lesion involving the lateral right sixth rib, metastasis not excluded.   Electronically Signed   By: Skipper Cliche M.D.   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 Restaging. Subsequent encounter.  EXAM: CT HEAD WITHOUT AND WITH CONTRAST  TECHNIQUE: Contiguous axial images were obtained from the base of the skull through the vertex without and with intravenous contrast  CONTRAST:  146mL OMNIPAQUE IOHEXOL 300 MG/ML SOLN in conjunction with contrast enhanced imaging of the chest reported separately.  COMPARISON:  Brain MRI 03/14/2013.  FINDINGS: Visible paranasal sinuses and mastoids are stable and clear. Negative visualized osseous structures. Negative scalp and orbits soft tissues.  Stable cerebral volume. No midline shift, ventriculomegaly, mass effect, evidence of mass lesion, intracranial hemorrhage or evidence of cortically based acute infarction. Gray-white matter differentiation is within normal limits throughout the brain. No abnormal enhancement identified. Major intracranial vascular structures are enhancing.  IMPRESSION: 1. Stable and normal CT appearance of the brain. 2. Chest CT from today reported separately.   Electronically Signed    By: Lars Pinks M.D.   On: 08/03/2013 13:47   Ct Chest W Contrast  08/03/2013   CLINICAL DATA:  Small cell  lung cancer diagnosed 02/2013, chemotherapy and XRT complete, shortness of breath, dysphagia, weight loss.  EXAM: CT CHEST WITH CONTRAST  TECHNIQUE: Multidetector CT imaging of the chest was performed during intravenous contrast administration.  CONTRAST:  122mL OMNIPAQUE IOHEXOL 300 MG/ML  SOLN  COMPARISON:  CT chest dated 05/02/2013  FINDINGS: Although study was not tailored for optimal evaluation of the pulmonary arteries, there are bilateral pulmonary emboli involving the right middle lobe and bilateral lower lobe pulmonary arteries, as well as the lingular branch. Overall clot burden is moderate. No findings to suggest right heart strain, with an RV-to-LV ratio of 0.77.  Postsurgical changes related to prior right lung wedge resection. Radiation changes in the superior segment left lower lobe.  New nodularity in the lingula measuring up to 1.7 x 1.5 cm (series 11/ image 21). Additional 11 mm irregular nodular opacity in the left lower lobe (series 11/ image 49), new).  Moderate to severe centrilobular and paraseptal emphysematous changes. Mild scarring in the anterolateral right lung base (series 11/ image 40), unchanged. No pleural effusion or pneumothorax.  Visualized thyroid is unremarkable.  Heart is normal in size. Small pericardial effusion, new/increased. Atherosclerotic calcifications of the aortic arch. Coronary atherosclerosis.  1.4 cm short axis right paratracheal node (series 5/ image 10), previously 1.2 cm.  Wall thickening/edema involving the mid esophagus (series 5/image 27), unchanged.  Visualized upper abdomen is unremarkable.  Degenerative changes of the visualized thoracolumbar spine. Multiple right rib defects related to prior right thoracotomy.  IMPRESSION: Bilateral pulmonary emboli, as described above. Overall clot burden is moderate.  Prior right lung wedge resection with radiation  changes in the superior segment left lower lobe.  New nodularity in the lingula and left lower lobe, measuring up to 1.7 cm. While nonspecific, possibly post infectious/inflammatory and/or related to radiation changes, metastatic disease is not excluded. Attention on follow-up is suggested.  Mildly progressive right paratracheal lymphadenopathy measuring up to 1.4 cm, worrisome for nodal recurrence.  Critical value/emergent results were called by telephone at the time of interpretation on 08/03/2013 at 10:50 AM to Dr. Curt Bears , who verbally acknowledged these results. At his request, the patient was notified of the results and sent to the ED.   Electronically Signed   By: Julian Hy M.D.   On: 08/03/2013 11:07   Ct Angio Chest Pe W/cm &/or Wo Cm  08/20/2013   CLINICAL DATA:  Severe shortness of breath and known blood clots, being treated with Lovenox, history of lung cancer with chemotherapy and radiation therapy completed, chest pain with burning sensation  EXAM: CT ANGIOGRAPHY CHEST WITH CONTRAST  TECHNIQUE: Multidetector CT imaging of the chest was performed using the standard protocol during bolus administration of intravenous contrast. Multiplanar CT image reconstructions and MIPs were obtained to evaluate the vascular anatomy.  CONTRAST:  118mL OMNIPAQUE IOHEXOL 350 MG/ML SOLN  COMPARISON:  DG CHEST 2 VIEW dated 08/20/2013; CT CHEST W/CM dated 08/03/2013  FINDINGS: Again identified is pulmonary embolism involving right middle lobe and right lower lobe branches as well as left lower lobe and lingular branches. Clot burden is moderate but slightly decreased when compared to prior study. RV to LV ratio is again 0.77 with no evidence of right heart strain.  There is a moderate pericardial effusion measuring up to about 28 mm. Right paratracheal adenopathy stable.  Chronic right rib deformities stable. Diffuse emphysematous change stable. Postsurgical change right perihilar area, unchanged from prior  examination. On the left side, there is an irregular lower lobe  mass measuring 14 x 11 mm. It is increased in degree of consolidation and size when compared to recent prior study and is consistent with metastasis. In the left midlung zone there is an irregular opacity measuring 19 x 16 mm, not significantly different from the prior study. There is mild increase in the degree of diffuse interstitial prominence in the left central lung region. There is a tiny left pleural effusion, similar to the prior study. Pericardial effusion has mildly increased when compared to the prior study.  No significant acute abnormality involving the thoracic aorta. Scans through the upper abdomen are unremarkable.  Review of the MIP images confirms the above findings.  IMPRESSION: Slightly decreased clot burden when compared to prior study.  Mild increase in the size of the pericardial effusion.  Stable nodule in the lingula concerning for metastasis. Enlarging left lower lobe irregular nodule concerning for metastasis. 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   Electronically Signed   By: Skipper Cliche M.D.   On: 08/20/2013 17:38    Microbiology: Recent Results (from the past 240 hour(s))  MRSA PCR SCREENING     Status: Abnormal   Collection Time    08/20/13  9:20 PM      Result Value Ref Range Status   MRSA by PCR POSITIVE (*) NEGATIVE Corrected   Comment: RESULT CALLED TO, READ BACK BY AND VERIFIED WITH:     SPOKE WITH REEVES,M RN [phone] [id number] COVINGTON,N     CORRECTED RESULTS CALLED TO:     REEVES,M RN [phone] [id number] COVINGTON,N     CORRECTED ON 04/12 AT 0429: PREVIOUSLY REPORTED AS POSITIVE        The GeneXpert MRSA Assay (FDA approved for NASAL specimens only), is one component of a comprehensive MRSA colonization surveillance program. It is not intended to diagnose MRSA infection      nor to guide or monitor treatment for MRSA infections. INVALID RESULTS, SPECIMEN SENT FOR  CULTURE SPOKE WITH REEVES,M RN [phone] COVINGTON,N     Labs: Basic Metabolic Panel:  Recent Labs Lab 08/20/13 1545 08/21/13 0204 08/23/13 0333 08/24/13 0245 08/25/13 0327  NA 136* 136* 139 138 140  K 4.3 4.8 4.6 3.8 3.9  CL 96 100 99 100 102  CO2 25 26 26 23 24   GLUCOSE 230* 136* 106* 117* 110*  BUN 20 19 14 10 9   CREATININE 1.31 1.18 1.29 1.18 1.27  CALCIUM 9.7 9.1 9.4 8.9 9.3   Liver Function Tests: No results found for this basename: AST, ALT, ALKPHOS, BILITOT, PROT, ALBUMIN,  in the last 168 hours No results found for this basename: LIPASE, AMYLASE,  in the last 168 hours No results found for this basename: AMMONIA,  in the last 168 hours CBC:  Recent Labs Lab 08/20/13 1545 08/21/13 0204 08/22/13 0314 08/23/13 0333 08/24/13 0245 08/25/13 0327  WBC 6.3 4.6 6.5 6.0 4.9 4.9  NEUTROABS 5.0  --   --   --   --   --   HGB 13.8 12.3* 12.2* 11.3* 10.8* 10.9*  HCT 41.1 37.2* 36.2* 34.1* 32.1* 32.7*  MCV 93.6 94.7 94.8 94.7 94.1 94.0  PLT 176 158 153 144* 149* 139*   Cardiac Enzymes:  Recent Labs Lab 08/20/13 1545 08/20/13 1800 08/21/13 0025 08/22/13 1400 08/22/13 1940  TROPONINI <0.30 0.45* 8.47* 4.50* 5.69*   BNP: BNP (last 3 results)  Recent Labs  04/28/13 1430 08/03/13 1130 08/20/13 1811  PROBNP 678.8* 2537.0* 1986.0*   CBG:  Recent  Labs Lab 08/24/13 1237 08/24/13 1639 08/24/13 2159 08/25/13 0752 08/25/13 1204  GLUCAP 146* 145* 120* 107* 158*       Signed:  Samella Parr ANP [facility] 08/25/2013, 1:03 PM   Examining patient and discuss assessment and plan with a and P. Ebony Hail and agree the patient is ready for discharge. Plan was discussed with patient and patient's family.

## 2013-08-25 NOTE — Progress Notes (Signed)
CARDIAC REHAB PHASE I   PRE:  Rate/Rhythm: 94 SR    BP: sitting 113/66    SaO2: 93 RA  MODE:  Ambulation: 450 ft   POST:  Rate/Rhythm: 118 ST    BP: sitting 115/77     SaO2: 88 2L  Pt desaturated on RA with activity yesterday. Discussed with pt and RN that he needed O2 with activity. Today he did not have O2 tubing in his room. SaO2 at rest 93 RA however desat to 81 RA after 100 ft. Pt very SOB, which he sts is new this admission. Sts he was able to walk around grocery store on RA in between this admission and his PE admission. Applied 2L O2 and pt was able to walk rest of distance without resting. SaO2 stable in low 90s, down to 88 2L at end of walk.  Again discussed with pt his need to wear O2 any time he gets out of bed or walks here in hospital and at home. He can be on RA when at rest.  [place], ACSM 08/25/2013 9:47 AM

## 2013-08-25 NOTE — Progress Notes (Signed)
Subjective: Ready to go home.  Objective: Vital signs in last 24 hours: Temp:  [93 F (33.9 C)-98.2 F (36.8 C)] 98.1 F (36.7 C) (04/16 0500) Pulse Rate:  [89-101] 89 (04/16 0500) Resp:  [16-18] 16 (04/16 0500) BP: (96-114)/(62-80) 111/80 mmHg (04/16 0500) SpO2:  [92 %-98 %] 92 % (04/16 0500) Weight:  [193 lb (87.544 kg)] 193 lb (87.544 kg) (04/15 1439) Last BM Date: 08/24/13  Intake/Output from previous day: 04/15 0701 - 04/16 0700 In: 360 [P.O.:360] Out: 950 [Urine:950] Intake/Output this shift: Total I/O In: 360 [P.O.:360] Out: -   Medications Current Facility-Administered Medications  Medication Dose Route Frequency Provider Last Rate Last Dose  . acetaminophen (TYLENOL) tablet 650 mg  650 mg Oral Q6H PRN Ripudeep Krystal Eaton, MD       Or  . acetaminophen (TYLENOL) suppository 650 mg  650 mg Rectal Q6H PRN Ripudeep K Rai, MD      . alum & mag hydroxide-simeth (MAALOX/MYLANTA) 200-200-20 MG/5ML suspension 30 mL  30 mL Oral Q6H PRN Ripudeep K Rai, MD      . aspirin EC tablet 81 mg  81 mg Oral Daily Grafton Folk, MD   81 mg at 08/24/13 0930  . atorvastatin (LIPITOR) tablet 40 mg  40 mg Oral q1800 Ripudeep Krystal Eaton, MD   40 mg at 08/24/13 1706  . Chlorhexidine Gluconate Cloth 2 % PADS 6 each  6 each Topical Q0600 Jettie Booze, MD   6 each at 08/25/13 [phone]  . clopidogrel (PLAVIX) tablet 75 mg  75 mg Oral Q breakfast Leonie Man, MD   75 mg at 08/24/13 0930  . enoxaparin (LOVENOX) injection 130 mg  1.5 mg/kg Subcutaneous Q24H Georgina Peer, RPH   130 mg at 08/24/13 2992  . feeding supplement (ENSURE) (ENSURE) pudding 1 Container  1 Container Oral TID BM Samella Parr, NP      . feeding supplement (RESOURCE BREEZE) (RESOURCE BREEZE) liquid 1 Container  1 Container Oral TID BM Samella Parr, NP   1 Container at 08/24/13 1411  . gi cocktail (Maalox,Lidocaine,Donnatal)  30 mL Oral TID PRN Ripudeep Krystal Eaton, MD   30 mL at 08/23/13 0337  . HYDROcodone-acetaminophen  (HYCET) 7.5-325 mg/15 ml solution 10 mL  10 mL Oral QID PRN Grafton Folk, MD      . HYDROmorphone (DILAUDID) injection 1 mg  1 mg Intravenous Q2H PRN Cherene Altes, MD      . insulin aspart (novoLOG) injection 0-5 Units  0-5 Units Subcutaneous QHS Samella Parr, NP      . insulin aspart (novoLOG) injection 0-9 Units  0-9 Units Subcutaneous TID WC Samella Parr, NP   3 Units at 08/23/13 1734  . isosorbide mononitrate (IMDUR) 24 hr tablet 60 mg  60 mg Oral q morning - 10a Ripudeep Krystal Eaton, MD   60 mg at 08/24/13 0931  . levalbuterol (XOPENEX) nebulizer solution 0.63 mg  0.63 mg Nebulization Q3H PRN Cherene Altes, MD   0.63 mg at 08/24/13 2254  . magic mouthwash w/lidocaine  5 mL Oral QID PRN Grafton Folk, MD   5 mL at 08/25/13 0356  . metoprolol (LOPRESSOR) tablet 50 mg  50 mg Oral BID Troy Sine, MD   50 mg at 08/24/13 2215  . morphine 2 MG/ML injection 2 mg  2 mg Intravenous Q1H PRN Leonie Man, MD      . mupirocin ointment (BACTROBAN) 2 % 1  application  1 application Nasal BID Jettie Booze, MD   1 application at 74/16/38 2215  . ondansetron (ZOFRAN) tablet 4 mg  4 mg Oral Q6H PRN Ripudeep K Rai, MD       Or  . ondansetron (ZOFRAN) injection 4 mg  4 mg Intravenous Q6H PRN Ripudeep Krystal Eaton, MD   4 mg at 08/21/13 1909  . pantoprazole sodium (PROTONIX) 40 mg/20 mL oral suspension 40 mg  40 mg Oral BID Allie Bossier, MD   40 mg at 08/24/13 0931  . ranolazine (RANEXA) 12 hr tablet 500 mg  500 mg Oral BID Troy Sine, MD   500 mg at 08/24/13 2215    PE: General appearance: alert, cooperative and no distress Lungs: Wheeze in the upper left Heart: regular rate and rhythm, S1, S2 normal, no murmur, click, rub or gallop Extremities: No LEE Pulses: 2+ and symmetric Skin: Hands are very warm. Skin dry. Neurologic: Grossly normal  Lab Results:   Recent Labs  08/23/13 0333 08/24/13 0245 08/25/13 0327  WBC 6.0 4.9 4.9  HGB 11.3* 10.8* 10.9*  HCT 34.1* 32.1* 32.7*    PLT 144* 149* 139*   BMET  Recent Labs  08/23/13 0333 08/24/13 0245 08/25/13 0327  NA 139 138 140  K 4.6 3.8 3.9  CL 99 100 102  CO2 26 23 24   GLUCOSE 106* 117* 110*  BUN 14 10 9   CREATININE 1.29 1.18 1.27  CALCIUM 9.4 8.9 9.3   PT/INR  Recent Labs  08/23/13 0333  LABPROT 13.4  INR 1.04     Assessment/Plan  Active Problems:   Diabetes mellitus, type 2   HYPERTENSION, UNSPECIFIED   CAD, NATIVE VESSEL   Severe chronic obstructive pulmonary disease   Small cell carcinoma of lung   Atrial fibrillation with RVR   Protein-calorie malnutrition, severe   Dysphagia, unspecified(787.20)   Stricture and stenosis of esophagus   Radiation esophagitis   Submassive pulmonary embolism   Acute on chronic respiratory failure with hypoxia   Atypical chest pain   NSTEMI (non-ST elevated myocardial infarction)   Chronic diastolic CHF (congestive heart failure), NYHA class 1   Pericardial effusion  Plan:  Day 2 sp PCI to LCX, but with total RCA and OM2 occlusion. ASA/plavix/imdur 60/lipitor.  Lopressor increased yesterday to 50 bid and ranexa 500bid. BP stable for the most part.  Pt ambulated 450 ft with cardiac rehab.  Desating to 81 on RA with walking.  O2 at home.  On lovenox for PE.  Maintaining NSR on tele.   Follow u pwith Dr. Aundra Dubin   LOS: 5 days   Tarri Fuller PA-C 08/25/2013 10:16 AM  The patient was seen, examined and discussed with Tarri Fuller, PA-C and I agree with the above.   A very unfortunate 58 year old male with h/o SCLC, admitted for NSTEMI, s/p PCI to LCX, with total RCA and OM2 occlusion. We will continue dual antiplatelet therapy for at least 3 months, then can stop aspirin, continue metoprolol, ranexa 500 mg po BID, imdur, atorvastatin. Follow up in our clinic.  Dorothy Spark 08/25/2013

## 2013-08-25 NOTE — Discharge Instructions (Signed)
Esophageal Stricture The esophagus is the long, narrow tube which carries food and liquid from the mouth to the stomach. Sometimes a part of the esophagus becomes narrow and makes it difficult, painful, or even impossible to swallow. This is called an esophageal stricture.  CAUSES  Common causes of blockage or strictures of the esophagus are:  Exposure of the lower esophagus to the acid from the stomach may cause narrowing.  Hiatal hernia in which a small part of the stomach bulges up through the diaphragm can cause a narrowing in the bottom of the esophagus.  Scleroderma is a tissue disorder that affects the esophagus and makes swallowing difficult.  Achalasia is an absence of nerves in the lower esophagus and to the esophageal sphincter. This absence of nerves may be congenital (present since birth). This can cause irregular spasms which do not allow food and fluid through.  Strictures may develop from swallowing materials which damage the esophagus. Examples are acids or alkalis such as lye.  Schatzki's Ring is a narrow ring of non-cancerous tissue which narrows the lower esophagus. The cause of this is unknown.  Growths can block the esophagus. SYMPTOMS  Some of the problems are difficulty swallowing or pain with swallowing. DIAGNOSIS  Your caregiver often suspects this problem by taking a medical history. They will also do a physical exam. They may then take X-rays and/or perform an endoscopy. Endoscopy is an exam in which a tube like a small flexible telescope is used to look at your esophagus.  TREATMENT  One form of treatment is to dilate the narrow area. This means to stretch it.  When this is not successful, chest surgery may be required. This is a much more extensive form of treatment with a longer recovery time. Both of the above treatments make the passage of food and water into the stomach easier. They also make it easier for stomach contents to bubble back into the  esophagus. Special medications may be used following the procedure to help prevent further narrowing. Medications may be used to lower the amount of acid in the stomach juice.  SEEK IMMEDIATE MEDICAL CARE IF:   Your swallowing is becoming more painful, difficult, or you are unable to swallow.  You vomit up blood.  You develop black tarry stools.  You develop chills.  You have a fever.  You develop chest or abdominal pain.  You develop shortness of breath, feel lightheaded, or faint. Follow up with medical care as your caregiver suggests. Document Released: 01/06/2006 Document Revised: 07/21/2011 Document Reviewed: 02/12/2006 [facility] Patient Information 2014 [place], [place]. Coronary Angiography With Stent, Care After Refer to this sheet in the next few weeks. These instructions provide you with information on caring for yourself after your procedure. Your health care provider may also give you more specific instructions. Your treatment has been planned according to current medical practices, but problems sometimes occur. Call your health care provider if you have any problems or questions after your procedure.  WHAT TO EXPECT AFTER THE PROCEDURE  The insertion site may be tender for a few days after your procedure. HOME CARE INSTRUCTIONS   Only take over-the-counter or prescription medicines as directed by your health care provider. Take all medicines exactly as instructed. Blood thinners may be prescribed after your procedure to improve blood flow through the stent.  Change any bandages (dressings) as directed by your health care provider.   Check your insertion site every day for redness, swelling, or fluid leaking from the insertion.  You may take showers. Pat the insertion area dry. Do not rub the insertion area with a washcloth or towel.   Eat a heart-healthy diet. This should include plenty of fresh fruits and vegetables. Meat should be lean cuts. Avoid the following types  of food:   Food that is high in salt.   Canned or highly processed food.   Food that is high in saturated fat or sugar.   Fried food.   Make any other lifestyle changes recommended by your health care provider. This may include:   Quitting smoking.   Managing your weight.   Getting regular exercise.   Managing your blood pressure.   Limiting your alcohol intake.   Managing other health problems, such as diabetes.   If you need an MRI after your heart stent was placed, be sure to tell the health care provider who orders the MRI that you have a heart stent.   Follow up with your health care provider as directed.  SEEK IMMEDIATE MEDICAL CARE IF:   You develop chest pain, shortness of breath, feel faint, or pass out.  You have bleeding, swelling larger than a walnut, or drainage from the catheter insertion site.  You develop pain, discoloration, coldness, or severe bruising in the leg or arm that held the catheter.  You develop bleeding from any other place such as from the bowels. There may be bright red blood in the urine or stools, or it may appear as black, tarry stools.  You have a fever or chills. MAKE SURE YOU:  Understand these instructions.  Will watch your condition.  Will get help right away if you are not doing well or get worse. Document Released: 11/15/2004 Document Revised: 12/29/2012 Document Reviewed: 09/29/2012 [facility] Patient Information 2014 [place]. Ranolazine tablets, extended release What is this medicine? RANOLAZINE (ra NOE la zeen) is a heart medicine. It is used to treat chronic chest pain (angina). This medicine must be taken regularly. It will not relieve an acute episode of chest pain. This medicine may be used for other purposes; ask your health care provider or pharmacist if you have questions. COMMON BRAND NAME(S): Ranexa What should I tell my health care provider before I take this medicine? They need to know if  you have any of these conditions: -heart disease -irregular heartbeat -kidney disease -liver disease -low levels of potassium or magnesium in the blood -an unusual or allergic reaction to ranolazine, other medicines, foods, dyes, or preservatives -pregnant or trying to get pregnant -breast-feeding How should I use this medicine? Take this medicine by mouth with a glass of water. Follow the directions on the prescription label. Do not cut, crush, or chew this medicine. Take with or without food. Do not take this medication with grapefruit juice. Take your doses at regular intervals. Do not take your medicine more often then directed. Talk to your pediatrician regarding the use of this medicine in children. Special care may be needed. Overdosage: If you think you have taken too much of this medicine contact a poison control center or emergency room at once. NOTE: This medicine is only for you. Do not share this medicine with others. What if I miss a dose? If you miss a dose, take it as soon as you can. If it is almost time for your next dose, take only that dose. Do not take double or extra doses. What may interact with this medicine? Do not take this medicine with any of the following medications: -antivirals  for HIV or AIDS -cerivastatin -certain antibiotics like chloramphenicol, clarithromycin, dalfopristin; quinupristin, isoniazid, rifabutin, rifampin, rifapentine -certain medicines used for cancer like imatinib, nilotinib -certain medicines for fungal infections like fluconazole, itraconazole, ketoconazole, posaconazole, voriconazole -certain medicines for irregular heart beat like dofetilide, dronedarone -certain medicines for seizures like carbamazepine, fosphenytoin, oxcarbazepine, phenobarbital, phenytoin -cisapride -conivaptan -cyclosporine -grapefruit or grapefruit juice -nefazodone -pimozide -quinacrine -St John's wort -thioridazine -ziprasidone  This medicine may also  interact with the following medications: -alfuzosin -certain medicines for depression, anxiety, or psychotic disturbances like bupropion, citalopram, fluoxetine, fluphenazine, paroxetine, perphenazine, risperidone, sertraline, trifluoperazine -certain medicines for cholesterol like atorvastatin, lovastatin, simvastatin -certain medicines for stomach problems like octreotide, palonosetron, prochlorperazine -eplerenone -ergot alkaloids like dihydroergotamine, ergonovine, ergotamine, methylergonovine -metformin -nicardipine -other medicines that prolong the QT interval (cause an abnormal heart rhythm) -sirolimus -tacrolimus This list may not describe all possible interactions. Give your health care provider a list of all the medicines, herbs, non-prescription drugs, or dietary supplements you use. Also tell them if you smoke, drink alcohol, or use illegal drugs. Some items may interact with your medicine. What should I watch for while using this medicine? Visit your doctor for regular check ups. Tell your doctor or healthcare professional if your symptoms do not start to get better or if they get worse. This medicine will not relieve an acute attack of angina or chest pain. This medicine can change your heart rhythm. Your health care provider may check your heart rhythm by ordering an electrocardiogram (ECG) while you are taking this medicine. You may get drowsy or dizzy. Do not drive, use machinery, or do anything that needs mental alertness until you know how this medicine affects you. Do not stand or sit up quickly, especially if you are an older patient. This reduces the risk of dizzy or fainting spells. Alcohol may interfere with the effect of this medicine. Avoid alcoholic drinks. If you are scheduled for any medical or dental procedure, tell your healthcare provider that you are taking this medicine. This medicine can interact with other medicines used during surgery. What side effects may I  notice from receiving this medicine? Side effects that you should report to your doctor or health care professional as soon as possible: -allergic reactions like skin rash, itching or hives, swelling of the face, lips, or tongue -breathing problems -changes in vision -fast, irregular or pounding heartbeat -feeling faint or lightheaded, falls -low or high blood pressure -numbness or tingling feelings -ringing in the ears -tremor or shakiness -slow heartbeat (fewer than 50 beats per minute) -swelling of the legs or feet Side effects that usually do not require medical attention (report to your prescriber or health care professional if they continue or are bothersome): -constipation -drowsy -dry mouth -headache -nausea or vomiting -stomach upset This list may not describe all possible side effects. Call your doctor for medical advice about side effects. You may report side effects to FDA at 1-800-FDA-1088. Where should I keep my medicine? Keep out of the reach of children. Store at room temperature between 15 and 30 degrees C (59 and 86 degrees F). Throw away any unused medicine after the expiration date. NOTE: This sheet is a summary. It may not cover all possible information. If you have questions about this medicine, talk to your doctor, pharmacist, or health care provider.  2014, Elsevier/[employer] Standard. (2012-11-22 13:40:32) Clopidogrel tablets What is this medicine? CLOPIDOGREL (kloh PID oh grel) helps to prevent blood clots. This medicine is used to prevent heart attack, stroke, or other  vascular events in people who are at high risk. This medicine may be used for other purposes; ask your health care provider or pharmacist if you have questions. COMMON BRAND NAME(S): Plavix What should I tell my health care provider before I take this medicine? They need to know if you have any of the following conditions: -bleeding disorder -bleeding in the brain -planned surgery -stomach or  intestinal ulcers -stroke or transient ischemic attack -an unusual or allergic reaction to clopidogrel, other medicines, foods, dyes, or preservatives -pregnant or trying to get pregnant -breast-feeding How should I use this medicine? Take this medicine by mouth with a drink of water. Follow the directions on the prescription label. You may take this medicine with or without food. Take your medicine at regular intervals. Do not take your medicine more often than directed. Talk to your pediatrician regarding the use of this medicine in children. Special care may be needed. Overdosage: If you think you have taken too much of this medicine contact a poison control center or emergency room at once. NOTE: This medicine is only for you. Do not share this medicine with others. What if I miss a dose? If you miss a dose, take it as soon as you can. If it is almost time for your next dose, take only that dose. Do not take double or extra doses. What may interact with this medicine? -aspirin -blood thinners like cilostazol, enoxaparin, ticlopidine, and warfarin -certain medicines for depression like citalopram, fluoxetine, and fluvoxamine -certain medicines for fungal infections like ketoconazole, fluconazole, and voriconazole -certain medicines for HIV infection like delavirdine, efavirenz, and etravirine -certain medicines for seizures like felbamate, oxcarbazepine, and phenytoin -chloramphenicol -fluvastatin -isoniazid, INH -medicines for inflammation like ibuprofen and naproxen -modafinil -nicardipine -over-the counter supplements like echinacea, feverfew, fish oil, garlic, ginger, ginkgo, green tea, horse chestnut -quinine -stomach acid blockers like cimetidine, omeprazole, and esomeprazole -tamoxifen -tolbutamide -topiramate -torsemide This list may not describe all possible interactions. Give your health care provider a list of all the medicines, herbs, non-prescription drugs, or dietary  supplements you use. Also tell them if you smoke, drink alcohol, or use illegal drugs. Some items may interact with your medicine. What should I watch for while using this medicine? Visit your doctor or health care professional for regular check ups. Do not stop taking your medicine unless your doctor tells you to. Notify your doctor or health care professional and seek emergency treatment if you develop breathing problems; changes in vision; chest pain; severe, sudden headache; pain, swelling, warmth in the leg; trouble speaking; sudden numbness or weakness of the face, arm or leg. These can be signs that your condition has gotten worse. If you are going to have surgery or dental work, tell your doctor or health care professional that you are taking this medicine. Certain genetic factors may reduce the effect of this medicine. Your doctor may use genetic tests to determine treatment. What side effects may I notice from receiving this medicine? Side effects that you should report to your doctor or health care professional as soon as possible: -allergic reactions like skin rash, itching or hives, swelling of the face, lips, or tongue -breathing problems -changes in vision -fever -signs and symptoms of bleeding such as bloody or black, tarry stools; red or dark-brown urine; spitting up blood or brown material that looks like coffee grounds; red spots on the skin; unusual bruising or bleeding from the eye, gums, or nose -sudden weakness -unusual bleeding or bruising Side effects that usually  do not require medical attention (report to your doctor or health care professional if they continue or are bothersome): -constipation or diarrhea -headache -pain in back or joints -stomach upset This list may not describe all possible side effects. Call your doctor for medical advice about side effects. You may report side effects to FDA at 1-800-FDA-1088. Where should I keep my medicine? Keep out of the  reach of children. Store at room temperature of 59 to 86 degrees F (15 to 30 degrees C). Throw away any unused medicine after the expiration date. NOTE: This sheet is a summary. It may not cover all possible information. If you have questions about this medicine, talk to your doctor, pharmacist, or health care provider.  2014, Elsevier/Gold Standard. (2012-08-24 16:34:37)  Atrial Fibrillation Atrial fibrillation is a type of irregular heart rhythm (arrhythmia). During atrial fibrillation, the upper chambers of the heart (atria) quiver continuously in a chaotic pattern. This causes an irregular and often rapid heart rate.  Atrial fibrillation is the result of the heart becoming overloaded with disorganized signals that tell it to beat. These signals are normally released one at a time by a part of the right atrium called the sinoatrial node. They then travel from the atria to the lower chambers of the heart (ventricles), causing the atria and ventricles to contract and pump blood as they pass. In atrial fibrillation, parts of the atria outside of the sinoatrial node also release these signals. This results in two problems. First, the atria receive so many signals that they do not have time to fully contract. Second, the ventricles, which can only receive one signal at a time, beat irregularly and out of rhythm with the atria.  There are three types of atrial fibrillation:   Paroxysmal Paroxysmal atrial fibrillation starts suddenly and stops on its own within a week.   Persistent Persistent atrial fibrillation lasts for more than a week. It may stop on its own or with treatment.   Permanent Permanent atrial fibrillation does not go away. Episodes of atrial fibrillation may lead to permanent atrial fibrillation.  Atrial fibrillation can prevent your heart from pumping blood normally. It increases your risk of stroke and can lead to heart failure.  CAUSES   Heart conditions, including a heart  attack, heart failure, coronary artery disease, and heart valve conditions.   Inflammation of the sac that surrounds the heart (pericarditis).   Blockage of an artery in the lungs (pulmonary embolism).   Pneumonia or other infections.   Chronic lung disease.   Thyroid problems, especially if the thyroid is overactive (hyperthyroidism).   Caffeine, excessive alcohol use, and use of some illegal drugs.   Use of some medications, including certain decongestants and diet pills.   Heart surgery.   Birth defects.  Sometimes, no cause can be found. When this happens, the atrial fibrillation is called lone atrial fibrillation. The risk of complications from atrial fibrillation increases if you have lone atrial fibrillation and you are age 55 years or older. RISK FACTORS  Heart failure.  Coronary artery disease  Diabetes mellitus.   High blood pressure (hypertension).   Obesity.   Other arrhythmias.   Increased age. SYMPTOMS   A feeling that your heart is beating rapidly or irregularly.   A feeling of discomfort or pain in your chest.   Shortness of breath.   Sudden lightheadedness or weakness.   Getting tired easily when exercising.   Urinating more often than normal (mainly when atrial fibrillation first begins).  In paroxysmal atrial fibrillation, symptoms may start and suddenly stop. DIAGNOSIS  Your caregiver may be able to detect atrial fibrillation when taking your pulse. Usually, testing is needed to diagnosis atrial fibrillation. Tests may include:   Electrocardiography. During this test, the electrical impulses of your heart are recorded while you are lying down.   Echocardiography. During echocardiography, sound waves are used to evaluate how blood flows through your heart.   Stress test. There is more than one type of stress test. If a stress test is needed, ask your caregiver about which type is best for you.   Chest X-ray exam.    Blood tests.   Computed tomography (CT).  TREATMENT   Treating any underlying conditions. For example, if you have an overactive thyroid, treating the condition may correct atrial fibrillation.   Medication. Medications may be given to control a rapid heart rate or to prevent blood clots, heart failure, or a stroke.   Procedure to correct the rhythm of the heart:  Electrical cardioversion. During electrical cardioversion, a controlled, low-energy shock is delivered to the heart through your skin. If you have chest pain, very low pressure blood pressure, or sudden heart failure, this procedure may need to be done as an emergency.  Catheter ablation. During this procedure, heart tissues that send the signals that cause atrial fibrillation are destroyed.  Maze or minimaze procedure. During this surgery, thin lines of heart tissue that carry the abnormal signals are destroyed. The maze procedure is an open-heart surgery. The minimaze procedure is a minimally invasive surgery. This means that small cuts are made to access the heart instead of a large opening.  Pulmonary venous isolation. During this surgery, tissue around the veins that carry blood from the lungs (pulmonary veins) is destroyed. This tissue is thought to carry the abnormal signals. HOME CARE INSTRUCTIONS   Take medications as directed by your caregiver.  Only take medications that your caregiver approves. Some medications can make atrial fibrillation worse or recur.  If blood thinners were prescribed by your caregiver, take them exactly as directed. Too much can cause bleeding. Too little and you will not have the needed protection against stroke and other problems.  Perform blood tests at home if directed by your caregiver.  Perform blood tests exactly as directed.   Quit smoking if you smoke.   Do not drink alcohol.   Do not drink caffeinated beverages such as coffee, soda, and some teas. You may drink  decaffeinated coffee, soda, or tea.   Maintain a healthy weight. Do not use diet pills unless your caregiver approves. They may make heart problems worse.   Follow diet instructions as directed by your caregiver.   Exercise regularly as directed by your caregiver.   Keep all follow-up appointments. PREVENTION  The following substances can cause atrial fibrillation to recur:   Caffeinated beverages.   Alcohol.   Certain medications, especially those used for breathing problems.   Certain herbs and herbal medications, such as those containing ephedra or ginseng.  Illegal drugs such as cocaine and amphetamines. Sometimes medications are given to prevent atrial fibrillation from recurring. Proper treatment of any underlying condition is also important in helping prevent recurrence.  SEEK MEDICAL CARE IF:  You notice a change in the rate, rhythm, or strength of your heartbeat.   You suddenly begin urinating more frequently.   You tire more easily when exerting yourself or exercising.  SEEK IMMEDIATE MEDICAL CARE IF:   You develop chest pain, abdominal  pain, sweating, or weakness.  You feel sick to your stomach (nauseous).  You develop shortness of breath.  You suddenly develop swollen feet and ankles.  You feel dizzy.  You face or limbs feel numb or weak.  There is a change in your vision or speech. MAKE SURE YOU:   Understand these instructions.  Will watch your condition.  Will get help right away if you are not doing well or get worse. Document Released: 04/28/2005 Document Revised: 08/23/2012 Document Reviewed: 06/08/2012 [facility] Patient Information 2014 [place].  Enoxaparin injection What is this medicine? ENOXAPARIN (ee nox a PA rin) is used after knee, hip, or abdominal surgeries to prevent blood clotting. It is also used to treat existing blood clots in the lungs or in the veins. This medicine may be used for other purposes; ask your  health care provider or pharmacist if you have questions. COMMON BRAND NAME(S): Lovenox What should I tell my health care provider before I take this medicine? They need to know if you have any of these conditions: -bleeding disorders, hemorrhage, or hemophilia -infection of the heart or heart valves -kidney or liver disease -previous stroke -prosthetic heart valve -recent surgery or delivery of a baby -ulcer in the stomach or intestine, diverticulitis, or other bowel disease -an unusual or allergic reaction to enoxaparin, heparin, pork or pork products, other medicines, foods, dyes, or preservatives -pregnant or trying to get pregnant -breast-feeding How should I use this medicine? This medicine is for injection under the skin. It is usually given by a health-care professional. You or a family member may be trained on how to give the injections. If you are to give yourself injections, make sure you understand how to use the syringe, measure the dose if necessary, and give the injection. To avoid bruising, do not rub the site where this medicine has been injected. Do not take your medicine more often than directed. Do not stop taking except on the advice of your doctor or health care professional. Make sure you receive a puncture-resistant container to dispose of the needles and syringes once you have finished with them. Do not reuse these items. Return the container to your doctor or health care professional for proper disposal. Talk to your pediatrician regarding the use of this medicine in children. Special care may be needed. Overdosage: If you think you have taken too much of this medicine contact a poison control center or emergency room at once. NOTE: This medicine is only for you. Do not share this medicine with others. What if I miss a dose? If you miss a dose, take it as soon as you can. If it is almost time for your next dose, take only that dose. Do not take double or extra  doses. What may interact with this medicine? Do not take this medicine with any of the following medications: -aspirin and aspirin-like medicines -heparin -mifepristone -palifermin -warfarin  This medicine may also interact with the following medications: -cilostazol -clopidogrel -dipyridamole -NSAIDs, medicines for pain and inflammation, like ibuprofen or naproxen -sulfinpyrazone -ticlopidine This list may not describe all possible interactions. Give your health care provider a list of all the medicines, herbs, non-prescription drugs, or dietary supplements you use. Also tell them if you smoke, drink alcohol, or use illegal drugs. Some items may interact with your medicine. What should I watch for while using this medicine? Visit your doctor or health care professional for regular checks on your progress. Your condition will be monitored carefully while you  are receiving this medicine. Notify your doctor or health care professional and seek emergency treatment if you develop breathing problems; changes in vision; chest pain; severe, sudden headache; pain, swelling, warmth in the leg; trouble speaking; sudden numbness or weakness of the face, arm, or leg. These can be signs that your condition has gotten worse. If you are going to have surgery, tell your doctor or health care professional that you are taking this medicine. Do not stop taking this medicine without first talking to your doctor. Be sure to refill your prescription before you run out of medicine. Avoid sports and activities that might cause injury while you are using this medicine. Severe falls or injuries can cause unseen bleeding. Be careful when using sharp tools or knives. Consider using an Copy. Take special care brushing or flossing your teeth. Report any injuries, bruising, or red spots on the skin to your doctor or health care professional. What side effects may I notice from receiving this medicine? Side effects  that you should report to your doctor or health care professional as soon as possible: -allergic reactions like skin rash, itching or hives, swelling of the face, lips, or tongue -feeling faint or lightheaded, falls -signs and symptoms of bleeding such as bloody or black, tarry stools; red or dark-brown urine; spitting up blood or brown material that looks like coffee grounds; red spots on the skin; unusual bruising or bleeding from the eye, gums, or nose  Side effects that usually do not require medical attention (report to your doctor or health care professional if they continue or are bothersome): -pain, redness, or irritation at site where injected This list may not describe all possible side effects. Call your doctor for medical advice about side effects. You may report side effects to FDA at 1-800-FDA-1088. Where should I keep my medicine? Keep out of the reach of children. Store at room temperature between 15 and 30 degrees C (59 and 86 degrees F). Do not freeze. If your injections have been specially prepared, you may need to store them in the refrigerator. Ask your pharmacist. Throw away any unused medicine after the expiration date. NOTE: This sheet is a summary. It may not cover all possible information. If you have questions about this medicine, talk to your doctor, pharmacist, or health care provider.  2014, [employer]vier/Gold Standard. (2012-08-24 16:13:24)   Radial Site Care Refer to this sheet in the next few weeks. These instructions provide you with information on caring for yourself after your procedure. Your caregiver may also give you more specific instructions. Your treatment has been planned according to current medical practices, but problems sometimes occur. Call your caregiver if you have any problems or questions after your procedure. HOME CARE INSTRUCTIONS You may shower the day after the procedure.Remove the bandage (dressing) and gently wash the site with plain soap and  water.Gently pat the site dry. Do not apply powder or lotion to the site. Do not submerge the affected site in water for 3 to 5 days. Inspect the site at least twice daily. Do not flex or bend the affected arm for 24 hours. No lifting over 5 pounds (2.3 kg) for 5 days after your procedure. Do not drive home if you are discharged the same day of the procedure. Have someone else drive you. You may drive 24 hours after the procedure unless otherwise instructed by your caregiver. Do not operate machinery or power tools for 24 hours. A responsible adult should be with you for the  first 24 hours after you arrive home. What to expect: Any bruising will usually fade within 1 to 2 weeks. Blood that collects in the tissue (hematoma) may be painful to the touch. It should usually decrease in size and tenderness within 1 to 2 weeks. SEEK IMMEDIATE MEDICAL CARE IF: You have unusual pain at the radial site. You have redness, warmth, swelling, or pain at the radial site. You have drainage (other than a small amount of blood on the dressing). You have chills. You have a fever or persistent symptoms for more than 72 hours. You have a fever and your symptoms suddenly get worse. Your arm becomes pale, cool, tingly, or numb. You have heavy bleeding from the site. Hold pressure on the site. Document Released: 05/31/2010 Document Revised: 07/21/2011 Document Reviewed: 05/31/2010 [facility] Patient Information 2014 [place], [place]. Myocardial Infarction A myocardial infarction (MI) is damage to the heart that is not reversible. It is also called a heart attack. An MI usually occurs when a heart (coronary) artery becomes blocked or narrowed. This cuts off the blood supply to the heart. When one or more of the heart (coronary) arteries becomes blocked, that area of the heart begins to die. This causes pain felt during an MI.  If you think you might be having an MI, call your local emergency services immediately (911  in [place] It is recommended that you take a 162 mg non-enteric coated aspirin if you do not have an aspirin allergy. Do not drive yourself to the hospital or wait to see if your symptoms go away. The sooner MI is treated, the greater the amount of heart muscle saved. Time is muscle. It can save your life. CAUSES  An MI can occur from:  A gradual buildup of a fatty substance called plaque. When plaque builds up in the arteries, this condition is called atherosclerosis. This buildup can block or reduce the blood supply to the heart artery(s).  A sudden plaque rupture within a heart artery that causes a blood clot (thrombus). A blood clot can block the heart artery which does not allow blood flow to the heart.  A severe tightening (spasm) of the heart artery. This is a less common cause of a heart attack. When a heart artery spasms, it cuts off blood flow through the artery. Spasms can occur in heart arteries that do not have atherosclerosis. RISK FACTORS People at risk for an MI usually have one or more risk factors, such as:  High blood pressure.  High cholesterol.  Smoking.  Gender. Men have a higher heart attack risk.  Overweight/obesity.  Age.  Family history.  Lack of exercise.  Diabetes.  Stress.  Excessive alcohol use.  Street drug use (cocaine and methamphetamines). SYMPTOMS  MI symptoms can vary, such as:  In both men and women, MI symptoms can include the following:  Chest pain. The chest pain may feel like a crushing, squeezing, or "pressure" type feeling. MI pain can be "referred," meaning pain can be caused in one part of the body but felt in another part of the body. Referred MI pain may occur in the left arm, neck, or jaw. Pain may even be felt in the right arm.  Shortness of breath (dyspnea).  Heartburn or indigestion with or without vomiting, shortness of breath, or sweating (diaphoresis).  Sudden, cold sweats.  Sudden lightheadedness.  Upper back  pain.  Women can have unique MI symptoms, such as:  Unexplained feelings of nervousness or anxiety.  Discomfort between the  shoulder blades (scapula) or upper back.  Tingling in the hands and arms.  In elderly people (regardless of gender), MI symptoms can be subtle, such as:  Sweating (diaphoresis).  Shortness of breath (dyspnea).  General tiredness (fatigue) or not feeling well (malaise). DIAGNOSIS  Diagnosis of an MI involves several tests such as:  An assessment of your vital signs such as heart rhythm, blood pressure, respiratory rate, and oxygen level.  An EKG (ECG) to look at the electrical activity of your heart.  Blood tests called cardiac markers are drawn at scheduled times to measure proteins or enzymes released by the damaged heart muscle.  A chest X-ray.  An echocardiogram to evaluate heart motion and blood flow.  Coronary angiography (cardiac catheterization). This is a diagnostic procedure to look at the heart arteries. TREATMENT  Acute Intervention. For an MI, the national standard in the [place] is to have an acute intervention in under 90 minutes from the time you get to the hospital. An acute intervention is a special procedure to open up the heart arteries. It is done in a treatment room called a "catheterization lab" (cath lab). Some hospitals do no have a cath lab. If you are having an MI and the hospital does not have a cath lab, the standard is to transport you to a hospital that has one. In the cath lab, acute intervention includes:  Angioplasty. An angioplasty involves inserting a thin, flexible tube (catheter) into an artery in either your groin or wrist. The catheter is threaded to the heart arteries. A balloon at the end of the catheter is inflated to open a narrowed or blocked heart artery. During an angioplasty procedure, a small mesh tube (stent) may be used to keep the heart artery open. Depending on your condition and health history, one of  two types of stents may be placed:  Drug-eluting stent (DES). A DES is coated with a medicine to prevent scar tissue from growing over the stent. With drug-eluting stents, blood thinning medicine will need to be taken for up to a year.  Bare metal stent. This type of stent has no special coating to keep tissue from growing over it. This type of stent is used if you cannot take blood thinning medicine for a prolonged time or you need surgery in the near future. After a bare metal stent is placed, blood thinning medicine will need to be taken for about a month.  If you are taking blood thinning medicine (anti-platelet therapy) after stent placement, do not stop taking it unless your caregiver says it is okay to do so. Make sure you understand how long you need to take the medicine. Surgical Intervention  If an acute intervention is not successful, surgery may be needed:  Open heart surgery (coronary artery bypass graft, CABG). CABG takes a vein (saphenous vein) from your leg. The vein is then attached to the blocked heart artery which bypasses the blockage. This then allows blood flow to the heart muscle. Additional Interventions  A "clot buster" medicine (thrombolytic) may be given. This medicine can help break up a clot in the heart artery. This medicine may be given if a person cannot get to a cath lab right away.  Intra-aortic balloon pump (IABP). If you have suffered a very severe MI and are too unstable to go to the cath lab or to surgery, an IABP may be used. This is a temporary mechanical device used to increase blood flow to the heart and reduce the  workload of the heart until you are stable enough to go to the cath lab or surgery. HOME CARE INSTRUCTIONS After an MI, you may need the following:  Medicine. Take medicine as directed by your caregiver. Medicines after an MI may:  Keep your blood from clotting easily (blood thinners).  Control your blood pressure.  Help lower your  cholesterol.  Control abnormal heart rhythms.  Lifestyle changes. Under the guidance of your caregiver, lifestyle changes include:  Quitting smoking, if you smoke. Your caregiver can help you quit.  Being physically active.  Maintaining a healthy weight.  Eating a heart healthy diet. A dietitian can help you learn healthy eating options.  Managing diabetes.  Reducing stress.  Limiting alcohol intake. SEEK IMMEDIATE MEDICAL CARE IF:   You have severe chest pain, especially if the pain is crushing or pressure-like and spreads to the arms, back, neck, or jaw. This is an emergency. Do not wait to see if the pain will go away. Get medical help at once. Call your local emergency services (911 in the [place] Do not drive yourself to the hospital.  You have shortness of breath during rest, sleep, or with activity.  You have sudden sweating or clammy skin.  You feel sick to your stomach (nauseous) and throw up (vomit).  You suddenly become lightheaded or dizzy.  You feel your heart beating rapidly or you notice "skipped" beats. MAKE SURE YOU:   Understand these instructions.  Will watch your condition.  Will get help right away if you are not doing well or get worse. Document Released: 04/28/2005 Document Revised: 01/21/2012 Document Reviewed: 10/01/2010 [facility] Patient Information 2014 [place], [place].

## 2013-08-30 ENCOUNTER — Encounter (HOSPITAL_COMMUNITY): Payer: BC Managed Care – PPO | Admitting: Anesthesiology

## 2013-08-30 ENCOUNTER — Ambulatory Visit (HOSPITAL_COMMUNITY)
Admission: RE | Admit: 2013-08-30 | Discharge: 2013-08-30 | Disposition: A | Discharge status: Home / Self Care | Payer: BC Managed Care – PPO | Source: Ambulatory Visit | Attending: Gastroenterology | Admitting: Gastroenterology

## 2013-08-30 ENCOUNTER — Encounter (HOSPITAL_COMMUNITY): Payer: Self-pay | Admitting: Certified Registered"

## 2013-08-30 ENCOUNTER — Encounter (HOSPITAL_COMMUNITY)
Admission: RE | Disposition: A | Discharge status: Home / Self Care | Payer: Self-pay | Source: Ambulatory Visit | Attending: Gastroenterology

## 2013-08-30 ENCOUNTER — Ambulatory Visit (HOSPITAL_COMMUNITY): Payer: BC Managed Care – PPO | Admitting: Anesthesiology

## 2013-08-30 SURGERY — Surgical Case
Anesthesia: *Unknown

## 2013-08-30 SURGERY — EGD (ESOPHAGOGASTRODUODENOSCOPY)
Anesthesia: Monitor Anesthesia Care

## 2013-08-30 MED ORDER — LIDOCAINE HCL (CARDIAC) 20 MG/ML IV SOLN
INTRAVENOUS | Status: AC
  Filled 2013-08-30: qty 5

## 2013-08-30 MED ORDER — PROPOFOL 10 MG/ML IV BOLUS
INTRAVENOUS | Status: AC
  Filled 2013-08-30: qty 20

## 2013-08-30 NOTE — Anesthesia Preprocedure Evaluation (Addendum)
Anesthesia Evaluation  Patient identified by MRN, date of birth, ID band Patient awake    Reviewed: Allergy & Precautions, H&P , NPO status , Patient's Chart, lab work & pertinent test results  Airway Mallampati: II TM Distance: >3 FB Neck ROM: Full    Dental no notable dental hx.    Pulmonary asthma , COPD COPD inhaler, former smoker,  breath sounds clear to auscultation  + decreased breath sounds      Cardiovascular hypertension, Pt. on home beta blockers and Pt. on medications + CAD, + Past MI, + Cardiac Stents and +CHF Rhythm:Regular Rate:Normal     Neuro/Psych negative neurological ROS  negative psych ROS   GI/Hepatic negative GI ROS, Neg liver ROS,   Endo/Other  negative endocrine ROSdiabetes  Renal/GU Renal InsufficiencyRenal disease  negative genitourinary   Musculoskeletal negative musculoskeletal ROS (+)   Abdominal   Peds negative pediatric ROS (+)  Hematology negative hematology ROS (+)   Anesthesia Other Findings   Reproductive/Obstetrics negative OB ROS                          Anesthesia Physical Anesthesia Plan  ASA: IV  Anesthesia Plan: MAC   Post-op Pain Management:    Induction: Intravenous  Airway Management Planned: Nasal Cannula  Additional Equipment:   Intra-op Plan:   Post-operative Plan:   Informed Consent: I have reviewed the patients History and Physical, chart, labs and discussed the procedure including the risks, benefits and alternatives for the proposed anesthesia with the patient or authorized representative who has indicated his/her understanding and acceptance.   Dental advisory given  Plan Discussed with: CRNA and Surgeon  Anesthesia Plan Comments: (Patient had a NSTEMI one week ago and is s/p DES placement x 2  Case cancelled)       Anesthesia Quick Evaluation

## 2013-08-30 NOTE — OR Nursing (Signed)
Late entry:  Patient states he had an MI with a stent placement last week.  Also states that he has multiple blood clots in both lungs.  Dr. Kalman Shan and Dr. Deatra Ina notified and case cancelled.  Dr. Kelby Fam card given to patient with instruction to call for an appointment and the patient was discharged home.

## 2013-09-05 ENCOUNTER — Telehealth: Payer: Self-pay | Admitting: Gastroenterology

## 2013-09-05 NOTE — Telephone Encounter (Signed)
Pt was evaluated recently for a possible heart attack, he is calling to reschedule his EGD with savary dil. Do you want to see the pt in the office prior to scheduling another procedure? Please advise.

## 2013-09-06 ENCOUNTER — Ambulatory Visit
Admit: 2013-09-06 | Discharge: 2013-09-06 | Disposition: A | Discharge status: Home / Self Care | Payer: BC Managed Care – PPO | Attending: Radiation Oncology | Admitting: Radiation Oncology

## 2013-09-06 DIAGNOSIS — C3490 Malignant neoplasm of unspecified part of unspecified bronchus or lung: Secondary | ICD-10-CM | POA: Insufficient documentation

## 2013-09-06 DIAGNOSIS — R1310 Dysphagia, unspecified: Secondary | ICD-10-CM | POA: Insufficient documentation

## 2013-09-06 DIAGNOSIS — C7931 Secondary malignant neoplasm of brain: Secondary | ICD-10-CM | POA: Insufficient documentation

## 2013-09-06 DIAGNOSIS — R5383 Other fatigue: Secondary | ICD-10-CM

## 2013-09-06 DIAGNOSIS — Z510 Encounter for antineoplastic radiation therapy: Secondary | ICD-10-CM | POA: Insufficient documentation

## 2013-09-06 DIAGNOSIS — C7949 Secondary malignant neoplasm of other parts of nervous system: Secondary | ICD-10-CM

## 2013-09-06 DIAGNOSIS — R5381 Other malaise: Secondary | ICD-10-CM | POA: Insufficient documentation

## 2013-09-06 DIAGNOSIS — Z7902 Long term (current) use of antithrombotics/antiplatelets: Secondary | ICD-10-CM | POA: Insufficient documentation

## 2013-09-06 DIAGNOSIS — R0602 Shortness of breath: Secondary | ICD-10-CM | POA: Insufficient documentation

## 2013-09-06 MED ORDER — MAGIC MOUTHWASH W/LIDOCAINE: 5.0000 mL | Freq: Four times a day (QID) | ORAL | Status: DC | PRN

## 2013-09-06 NOTE — Telephone Encounter (Signed)
Per pt his cardiologist is Dr. Aundra Dubin.

## 2013-09-06 NOTE — Telephone Encounter (Signed)
I need to contact his cardiologist to verify this and to determine whether we can hold Plavix.  Find out who this person is.

## 2013-09-06 NOTE — Progress Notes (Signed)
[place] [facility]  Radiation Oncology   Simulation and Treatment Planning Note   Name: [name]    MRN: [id number]  Date: 09/06/2013    DOB: 16-Oct-1955  Status: Outpaitient  DIAGNOSIS: Prophylactic Cranial Irradiation.   CONSENT VERIFIED:yes   SET UP: Patient is setup supine   IMMOBILIZATION: Following immobilization is used:Aquaplast Mask   NARRATIVE:The patient was brought to the [facility]. Identity was confirmed. All relevant records and images related to the planned course of therapy were reviewed. Then, the patient was positioned in a stable reproducible clinical set-up for radiation therapy. CT images were obtained. Marks were placed on the mask. The CT images were loaded into the planning software where the target (brain) and avoidance structures (globes) were contoured. The radiation prescription was entered and confirmed.   TREATMENT PLANNING NOTE:  Treatment planning then occurred.  I have requested : MLC's, isodose plan, basic dose calculation

## 2013-09-07 ENCOUNTER — Telehealth: Payer: Self-pay

## 2013-09-07 ENCOUNTER — Other Ambulatory Visit: Payer: Self-pay | Admitting: Medical Oncology

## 2013-09-07 DIAGNOSIS — I2782 Chronic pulmonary embolism: Secondary | ICD-10-CM

## 2013-09-07 MED ORDER — ENOXAPARIN SODIUM 150 MG/ML ~~LOC~~ SOLN: 140.0000 mg | SUBCUTANEOUS | Status: DC

## 2013-09-07 NOTE — Telephone Encounter (Signed)
Pt scheduled to see Dr. Deatra Ina 10/05/13@2 :30pm. Pt aware of appt.

## 2013-09-07 NOTE — Telephone Encounter (Signed)
Message copied by Algernon Huxley on Wed Sep 07, 2013 11:34 AM ------      Message from: Erskine Emery D      Created: Wed Sep 07, 2013  9:43 AM      Regarding: RE: esophageal dilitation       Thanks for the information.  I will discuss with the patient.      Nicole Kindred, please schedule OV      ----- Message -----         From: Larey Dresser, MD         Sent: 09/06/2013   5:11 PM           To: Inda Castle, MD      Subject: RE: esophageal dilitation                                Hi Rob,      Tough situation.  He just got 2 drug-eluting stents so I don't think he can stop Plavix yet.  Would given him a month or so post-procedure and if he is very symptomatic, could consider dilation without stopping the meds if that is at all feasible.       Dalton      ----- Message -----         From: Inda Castle, MD         Sent: 09/06/2013   4:25 PM           To: Larey Dresser, MD      Subject: esophageal dilitation                                    Dalton,       Mr. 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  He has undergone sequential dilation therapy. I postponed his last dilation because of the acute MI.  At issue is whether you feel he is stable to repeat his dilatation, and whether we can hold his Plavix for approximately 5 days prior to the dilatation, and possibly for a few days following dilatation.  If the answer is no to the former, then I will defer any therapy until you feel he is stable.  If you are reluctant to stop Plavix, then I will discuss with him the risks for bleeding with dilatation therapy while on antiplatelet medication.  Unfortunately he remains symptomatic and would benefit from dilation therapy, keeping in mind that there is a higher risk for esophageal perforation with dilation of radiation-induced strictures as compared with uncomplicated peptic strictures.            Rob             ------

## 2013-09-13 ENCOUNTER — Ambulatory Visit
Admission: RE | Admit: 2013-09-13 | Discharge: 2013-09-13 | Disposition: A | Discharge status: Home / Self Care | Payer: BC Managed Care – PPO | Source: Ambulatory Visit | Attending: Radiation Oncology | Admitting: Radiation Oncology

## 2013-09-13 DIAGNOSIS — R918 Other nonspecific abnormal finding of lung field: Secondary | ICD-10-CM

## 2013-09-13 MED ORDER — BIAFINE EX EMUL
CUTANEOUS | Status: DC | PRN
  Administered 2013-09-13: 10:00:00 via TOPICAL

## 2013-09-14 ENCOUNTER — Ambulatory Visit
Admission: RE | Admit: 2013-09-14 | Discharge: 2013-09-14 | Disposition: A | Discharge status: Home / Self Care | Payer: BC Managed Care – PPO | Source: Ambulatory Visit | Attending: Radiation Oncology | Admitting: Radiation Oncology

## 2013-09-14 DIAGNOSIS — C3490 Malignant neoplasm of unspecified part of unspecified bronchus or lung: Secondary | ICD-10-CM

## 2013-09-14 MED ORDER — BIAFINE EX EMUL
CUTANEOUS | Status: DC | PRN
  Administered 2013-09-14: 16:00:00 via TOPICAL

## 2013-09-14 NOTE — Progress Notes (Signed)
Clinic routine reviewed.Reinforced that patient will see doctor on Tuesday after treatment.Told that side effects for prophylactic cranial irradiation should be minimal to none.Wash hair with mild shampoo.Given biafine to apply if any skin discoloration.Possible nausea and fatigue.

## 2013-09-14 NOTE — Addendum Note (Signed)
Encounter addended by: Arlyss Repress, RN on: 09/14/2013  3:38 PM<BR>     Documentation filed: Inpatient MAR

## 2013-09-15 ENCOUNTER — Ambulatory Visit (INDEPENDENT_AMBULATORY_CARE_PROVIDER_SITE_OTHER): Payer: BC Managed Care – PPO | Admitting: Physician Assistant

## 2013-09-15 ENCOUNTER — Encounter: Payer: Self-pay | Admitting: Physician Assistant

## 2013-09-15 ENCOUNTER — Ambulatory Visit
Admission: RE | Admit: 2013-09-15 | Discharge: 2013-09-15 | Disposition: A | Discharge status: Home / Self Care | Payer: BC Managed Care – PPO | Source: Ambulatory Visit | Attending: Radiation Oncology | Admitting: Radiation Oncology

## 2013-09-15 VITALS — BP 112/62 | HR 95 | Ht 70.0 in | Wt 197.8 lb

## 2013-09-15 DIAGNOSIS — E785 Hyperlipidemia, unspecified: Secondary | ICD-10-CM

## 2013-09-15 DIAGNOSIS — I2699 Other pulmonary embolism without acute cor pulmonale: Secondary | ICD-10-CM

## 2013-09-15 DIAGNOSIS — I10 Essential (primary) hypertension: Secondary | ICD-10-CM

## 2013-09-15 DIAGNOSIS — K208 Other esophagitis without bleeding: Secondary | ICD-10-CM

## 2013-09-15 DIAGNOSIS — I4891 Unspecified atrial fibrillation: Secondary | ICD-10-CM

## 2013-09-15 DIAGNOSIS — C3490 Malignant neoplasm of unspecified part of unspecified bronchus or lung: Secondary | ICD-10-CM

## 2013-09-15 DIAGNOSIS — I2510 Atherosclerotic heart disease of native coronary artery without angina pectoris: Secondary | ICD-10-CM

## 2013-09-15 DIAGNOSIS — J449 Chronic obstructive pulmonary disease, unspecified: Secondary | ICD-10-CM

## 2013-09-15 NOTE — Patient Instructions (Signed)
NO CHANGES WERE MADE TODAY  Your physician recommends that you schedule a follow-up appointment on 12/05/13 3:30 WITH DR. Aundra Dubin

## 2013-09-15 NOTE — Progress Notes (Signed)
[place], [place], [place]  [address] Phone: [phone] Fax:  [phone]  Date:  09/15/2013   ID:  [name], DOB 1955-06-30, MRN [id number]  PCP:  [name], MD  Cardiologist:  Dr. Loralie Champagne      History of Present Illness: [name] is a 58 y.o. 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  LHC in 04/2012 demonstrated mid CFX stent with 80% ISR but territory supplied by CFX beyond occluded PLOM was small.  Med Rx was chosen at that time.  Admitted in 07/2013 with bilateral pulmonary emboli.  Echo at that time demonstrated R heart strain.  He was d/c on Lovenox.    He was admitted 4/11-4/16 with a NSTEMI.  He had runs of AFib with RVR that converted to NSR.  Follow up Chest CT demonstrated reduced clot burden from PE and increased size of pericardial effusion. Echo demonstrated a trivial pericardial effusion and normal RVF.  He underwent LHC that demonstrated 100% ISR of CFX stent that was tx with DES x 2.  OM1 remained occluded and RCA was occluded (chronic).  Ranexa was added to his regimen for med tx of chronic occlusions.  DAP therapy was recommended for minimum of 3 mos, then ASA would be stopped.    He returns for follow up. He denies further chest pain. He has chronic NYHA class III dyspnea. There has been no change. He denies orthopnea, PND. He denies significant LE edema. He denies syncope.   Studies:  - LHC (08/23/13):  Inf and inferolateral HK, EF 40-45%, prox LAD 40-50%, CFX occluded at prox stent edge, OM2 stent occluded, RCA occluded.  PCI:  Xience Alpine DES (2.75 mm x 15 mm & 3.5 mm x 28 mm to prox to mid CFX  - Echo (08/23/13):  EF 60-65%, no RWMA, trivial eff, normal RVSF  - Nuclear (04/2012):  Reversible apical perfusion defect, fixed inf defect with partial  reversibility, EF 71%; Intermediate Risk   Recent Labs: 04/30/2013: TSH 0.971  06/03/2013: ALT 11  08/20/2013: Pro B Natriuretic peptide (BNP) 1986.0*  08/22/2013: HDL Cholesterol 43; LDL (calc) 69  08/25/2013: Creatinine 1.27; Hemoglobin 10.9*; Potassium 3.9   Wt Readings from Last 3 Encounters:  09/15/13 197 lb 12.8 oz (89.721 kg)  08/24/13 193 lb (87.544 kg)  08/24/13 193 lb (87.544 kg)     Past Medical History  Diagnosis Date  . CAD (coronary artery disease)     a. PCI 3/08 to OM2 and mid CFX; b.  NSTEMI 6/10:  Xience DES to distal CFX;  c. abnl MV 12/13 => LHC 04/23/12:  Med Rx planned;  d.  NSTEMI - LHC (08/23/13):  Inf and inferolateral HK, EF 40-45%, prox LAD 40-50%, CFX occluded at prox stent edge, OM2 stent occluded, RCA occluded.  PCI:  DES x 2 to CFX  . Diabetes mellitus type II   . Hyperlipidemia   . COPD (chronic obstructive pulmonary disease)     Quit tobacco 09/2009; Gold Stage 2 with asthma component - FEV1 1.53 L/54%, 14% fev1 BD response, DLCO 54% - July 2011; MM genotype 01/07/10; unable to afford spiriva and unwilling to try ics due to prior renal failure: state to Dr. [name], Aug 2011; started on atrovent HFA fall 2011. no desturation on walk test Dec  2011  . CKD (chronic kidney disease)   . Obesity   . History of CVA (cerebrovascular accident) 2007    Without residual deficits  . 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 no further rx  . Hypertension   . Asthma     as child  . Small cell lung cancer     Stage IIIA - s/p chemotherapy and radiation  . Radiation 03/16/13-04/28/13    Left lung 60Gy  . Pneumonia     02/14/2013 in hospital  . History of chemotherapy  finished dec 2014  . On home O2     constant/wears 2 liters prn  . Bilateral pulmonary embolism 08-03-2013    tx with Lovenox  . H/O non-ST elevation myocardial infarction (NSTEMI)     10/2008;  08/2013  . Radiation esophagitis     s/p esophageal dilation    Current Outpatient Prescriptions  Medication Sig Dispense Refill  . albuterol (PROVENTIL HFA;VENTOLIN HFA) 108 (90 BASE) MCG/ACT inhaler Inhale into the lungs every 6 (six) hours as needed for wheezing or shortness of breath.      Marland Kitchen albuterol (PROVENTIL) (2.5 MG/3ML) 0.083% nebulizer solution Take 2.5 mg by nebulization every 6 (six) hours as needed for wheezing or shortness of breath.      . Alum & Mag Hydroxide-Simeth (MAGIC MOUTHWASH W/LIDOCAINE) SOLN Take 5 mLs by mouth 4 (four) times daily as needed for mouth pain.  480 mL  3  . aspirin EC 81 MG EC tablet Take 1 tablet (81 mg total) by mouth daily.      Marland Kitchen atorvastatin (LIPITOR) 40 MG tablet Take 40 mg by mouth daily.       . clopidogrel (PLAVIX) 75 MG tablet Take 1 tablet (75 mg total) by mouth daily with breakfast.  30 tablet  0  . emollient (BIAFINE) cream Apply 1 application topically 2 (two) times daily.      Marland Kitchen enoxaparin (LOVENOX) 150 MG/ML injection Inject 0.93 mLs (140 mg total) into the skin daily.  32 Syringe  0  . feeding supplement, ENSURE, (ENSURE) PUDG Take 1 Container by mouth 3 (three) times daily between meals.    0  . feeding supplement, RESOURCE BREEZE, (RESOURCE BREEZE) LIQD Take 1 Container by mouth 3 (three) times daily between meals. OR EQUIVALENT AVAILABLE AT [facility]/DRUG STORE    0  . HYDROcodone-acetaminophen (HYCET) 7.5-325 mg/15 ml solution Take 10 mLs by mouth 4 (four) times daily as needed for moderate pain.  500 mL  0  . isosorbide mononitrate (IMDUR) 60 MG 24 hr tablet Take 60 mg by mouth every morning.      . metoprolol (LOPRESSOR) 50 MG tablet Take 1 tablet (50 mg total) by mouth 2 (two) times daily.  60 tablet  0  . nitroGLYCERIN (NITROSTAT)  0.4 MG SL tablet Place 0.4 mg under the tongue every 5 (five) minutes as needed for chest pain. May repeat up to 3 doses      . pantoprazole (PROTONIX) 40 MG tablet Take 40 mg by mouth 2 (two) times daily.      . ranolazine (RANEXA) 500 MG 12 hr tablet Take 1 tablet (500 mg total) by mouth 2 (two) times daily.  60 tablet  0   No current facility-administered medications for this visit.    Allergies:   Azithromycin; Erythromycin; Fentanyl; and Penicillins   Social History:  The patient  reports that he quit smoking about 5 years ago. His smoking use included Cigarettes. He has a 40 pack-year smoking history. He has never used smokeless tobacco. He reports that he does not drink alcohol or use illicit drugs.   Family History:  The patient's family history includes Cancer in his maternal grandfather and maternal uncle; Deep vein thrombosis in his sister; Diabetes in his mother; Heart disease in his mother; Stroke in his other.   ROS:  Please see the history of present illness.      All other systems reviewed and negative.   PHYSICAL EXAM: VS:  BP 112/62  Pulse 95  Ht 5\' 10"  (1.778 m)  Wt 197 lb 12.8 oz (89.721 kg)  BMI 28.38 kg/m2 Well nourished, well developed, in no acute distress HEENT: normal Neck: no JVD Cardiac:  normal S1, S2; RRR; no murmur Lungs:  Decreased breath sounds bilaterally, no wheezing, rhonchi or rales Abd: soft, nontender, no hepatomegaly Ext: no edemaright wrist without hematoma or mass  Skin: warm and dry Neuro:  CNs 2-12 intact, no focal abnormalities noted   EKG:  NSR, HR 95, low voltage, NSSTTW changes      ASSESSMENT AND PLAN:  1. CAD, NATIVE VESSEL: He is doing well after recent PCI with DES to the circumflex in the setting of non-STEMI. He will remain on aspirin, Plavix in addition to Lovenox for at least 3 months. At that point, we will consider stopping his aspirin. He currently has no symptoms consistent with previous angina. Continue aspirin, Plavix,  nitrates, beta blockers, statin. 2. Paroxysmal Atrial Fibrillation: Maintaining NSR. He is on Lovenox for anticoagulation in the setting of recent pulmonary emboli related to malignancy. 3. Submassive pulmonary embolism: Continue Lovenox as planned. 4. HYPERTENSION, UNSPECIFIED: Controlled. 5. Severe chronic obstructive pulmonary disease: Followup with pulmonology as planned. 6. Small cell carcinoma of lung: Followup with oncology as planned. 7. HYPERLIPIDEMIA-MIXED: Continue statin. 8. Radiation esophagitis: He is due for repeat esophageal dilatation. Unfortunately, he is not able to hold Plavix at this time given recent DES x2 to the circumflex in the setting of ACS. He is due to meet with Dr. Deatra Ina of gastroenterology soon to discuss this further. 9. Disposition: Follow up with Dr. Aundra Dubin in 2 months.  Signed, Richardson Dopp, PA-C  09/15/2013 8:58 AM

## 2013-09-16 ENCOUNTER — Ambulatory Visit
Admission: RE | Admit: 2013-09-16 | Discharge: 2013-09-16 | Disposition: A | Discharge status: Home / Self Care | Payer: BC Managed Care – PPO | Source: Ambulatory Visit | Attending: Radiation Oncology | Admitting: Radiation Oncology

## 2013-09-19 ENCOUNTER — Ambulatory Visit
Admission: RE | Admit: 2013-09-19 | Discharge: 2013-09-19 | Disposition: A | Discharge status: Home / Self Care | Payer: BC Managed Care – PPO | Source: Ambulatory Visit | Attending: Radiation Oncology | Admitting: Radiation Oncology

## 2013-09-20 ENCOUNTER — Ambulatory Visit
Admission: RE | Admit: 2013-09-20 | Discharge: 2013-09-20 | Disposition: A | Discharge status: Home / Self Care | Payer: BC Managed Care – PPO | Source: Ambulatory Visit | Attending: Radiation Oncology | Admitting: Radiation Oncology

## 2013-09-20 ENCOUNTER — Encounter: Payer: Self-pay | Admitting: Radiation Oncology

## 2013-09-20 VITALS — BP 104/68 | HR 86 | Temp 98.2°F | Resp 20 | Wt 198.7 lb

## 2013-09-20 DIAGNOSIS — C3490 Malignant neoplasm of unspecified part of unspecified bronchus or lung: Secondary | ICD-10-CM

## 2013-09-20 NOTE — Progress Notes (Signed)
Weekly Management Note Current Dose: 10  Gy  Projected Dose: 24 Gy   Narrative:  The patient presents for routine under treatment assessment.  CBCT/MVCT images/Port film x-rays were reviewed.  The chart was checked. Actually doing well with no complaints. No headaches. FAtigue and shortness of breath continues.   Physical Findings: Weight: 198 lb 11.2 oz (90.13 kg). Unchanged  Impression:  The patient is tolerating radiation.  Plan:  Continue treatment as planned.

## 2013-09-20 NOTE — Progress Notes (Signed)
Weekly rd txs l brain  5 tx s completed, no c/o pain, nausea, vision changes, head aches or dizzness, does have a nonproductive cough, in w/c, appetite good,  11:39 AM'

## 2013-09-21 ENCOUNTER — Ambulatory Visit
Admission: RE | Admit: 2013-09-21 | Discharge: 2013-09-21 | Disposition: A | Discharge status: Home / Self Care | Payer: BC Managed Care – PPO | Source: Ambulatory Visit | Attending: Radiation Oncology | Admitting: Radiation Oncology

## 2013-09-21 ENCOUNTER — Other Ambulatory Visit: Payer: Self-pay

## 2013-09-21 MED ORDER — RANOLAZINE ER 500 MG PO TB12: 500.0000 mg | ORAL_TABLET | Freq: Two times a day (BID) | ORAL | Status: DC

## 2013-09-22 ENCOUNTER — Ambulatory Visit
Admission: RE | Admit: 2013-09-22 | Discharge: 2013-09-22 | Disposition: A | Discharge status: Home / Self Care | Payer: BC Managed Care – PPO | Source: Ambulatory Visit | Attending: Radiation Oncology | Admitting: Radiation Oncology

## 2013-09-23 ENCOUNTER — Ambulatory Visit
Admission: RE | Admit: 2013-09-23 | Discharge: 2013-09-23 | Disposition: A | Discharge status: Home / Self Care | Payer: BC Managed Care – PPO | Source: Ambulatory Visit | Attending: Radiation Oncology | Admitting: Radiation Oncology

## 2013-09-26 ENCOUNTER — Ambulatory Visit
Admission: RE | Admit: 2013-09-26 | Discharge: 2013-09-26 | Disposition: A | Discharge status: Home / Self Care | Payer: BC Managed Care – PPO | Source: Ambulatory Visit | Attending: Radiation Oncology | Admitting: Radiation Oncology

## 2013-09-27 ENCOUNTER — Ambulatory Visit
Admission: RE | Admit: 2013-09-27 | Discharge: 2013-09-27 | Disposition: A | Discharge status: Home / Self Care | Payer: BC Managed Care – PPO | Source: Ambulatory Visit | Attending: Radiation Oncology | Admitting: Radiation Oncology

## 2013-09-27 VITALS — BP 113/77 | HR 112 | Temp 98.0°F | Resp 20 | Wt 198.5 lb

## 2013-09-27 DIAGNOSIS — C3490 Malignant neoplasm of unspecified part of unspecified bronchus or lung: Secondary | ICD-10-CM

## 2013-09-27 NOTE — Progress Notes (Signed)
Patient for weekly assessment of prophylactic brain treatment.Completed 9 of 11.denies pain.Intermittent shortness of breath which patient is aware this is related to pulmonary emboli.Oxygen sats 98%.

## 2013-09-27 NOTE — Progress Notes (Signed)
Weekly Management Note Current Dose: 18  Gy  Projected Dose: 24 Gy   Narrative:  The patient presents for routine under treatment assessment.  CBCT/MVCT images/Port film x-rays were reviewed.  The chart was checked. Doing well. No complaints. Dysphagia continues (waiting for cards clearance to stop plavix before attempting to dilate). No headaches.   Physical Findings: Weight: 198 lb 8 oz (90.039 kg). Skin of scalp is slightly pink.  Impression:  The patient is tolerating radiation.  Plan:  Continue treatment as planned. Continue radiaplex.

## 2013-09-28 ENCOUNTER — Ambulatory Visit
Admission: RE | Admit: 2013-09-28 | Discharge: 2013-09-28 | Disposition: A | Discharge status: Home / Self Care | Payer: BC Managed Care – PPO | Source: Ambulatory Visit | Attending: Radiation Oncology | Admitting: Radiation Oncology

## 2013-09-29 ENCOUNTER — Ambulatory Visit
Admission: RE | Admit: 2013-09-29 | Discharge: 2013-09-29 | Disposition: A | Discharge status: Home / Self Care | Payer: BC Managed Care – PPO | Source: Ambulatory Visit | Attending: Radiation Oncology | Admitting: Radiation Oncology

## 2013-09-29 ENCOUNTER — Encounter: Payer: Self-pay | Admitting: Radiation Oncology

## 2013-09-30 ENCOUNTER — Other Ambulatory Visit: Payer: Self-pay | Admitting: Cardiology

## 2013-09-30 ENCOUNTER — Other Ambulatory Visit: Payer: Self-pay

## 2013-09-30 MED ORDER — ISOSORBIDE MONONITRATE ER 60 MG PO TB24: 60.0000 mg | ORAL_TABLET | Freq: Every morning | ORAL | Status: DC

## 2013-10-03 NOTE — Progress Notes (Signed)
  Radiation Oncology         [phone] ________________________________  Name: [name] MRN: [id number]  Date: 09/29/2013  DOB: 09-13-1955  End of Treatment Note  Diagnosis:   Prophylactic Cranial Radiation for Small Cell Lung Cancer     Indication for treatment:  Curative       Radiation treatment dates:   09/14/2013-09/29/2013  Site/dose:   Whole brain/ 24 gy in 12 fractions at 2 Gy per fraction  Beams/energy:   Opposed laterals with 6 MV photons  Narrative: The patient tolerated radiation treatment relatively well.   He had some fatigue but no headaches.   Plan: The patient has completed radiation treatment. The patient will return to radiation oncology clinic for routine followup in one month. I advised them to call or return sooner if they have any questions or concerns related to their recovery or treatment. He also has scheduled follow up with medical oncology and gastroenterology to address his stricture.   ------------------------------------------------  Thea Silversmith, MD

## 2013-10-05 ENCOUNTER — Ambulatory Visit: Payer: Self-pay | Admitting: Gastroenterology

## 2013-10-06 NOTE — Progress Notes (Signed)
  Radiation Oncology         [phone] ________________________________  Name: [name] MRN: [id number]  Date: 09/13/13  DOB: 01-Dec-1955  Simulation Verification Note  Status: outpatient  NARRATIVE: The patient was brought to the treatment unit and placed in the planned treatment position. The clinical setup was verified. Then port films were obtained and uploaded to the radiation oncology medical record software.  The treatment beams were carefully compared against the planned radiation fields. The position location and shape of the radiation fields was reviewed. The targeted volume of tissue appears appropriately covered by the radiation beams. Organs at risk appear to be excluded as planned.  Based on my personal review, I approved the simulation verification. The patient's treatment will proceed as planned.  ------------------------------------------------  Thea Silversmith, MD

## 2013-10-17 ENCOUNTER — Ambulatory Visit (HOSPITAL_COMMUNITY)
Admission: RE | Admit: 2013-10-17 | Discharge: 2013-10-17 | Disposition: A | Discharge status: Home / Self Care | Payer: BC Managed Care – PPO | Source: Ambulatory Visit | Attending: Internal Medicine | Admitting: Internal Medicine

## 2013-10-17 ENCOUNTER — Encounter (HOSPITAL_COMMUNITY): Payer: Self-pay

## 2013-10-17 ENCOUNTER — Other Ambulatory Visit (HOSPITAL_BASED_OUTPATIENT_CLINIC_OR_DEPARTMENT_OTHER): Payer: BC Managed Care – PPO

## 2013-10-17 DIAGNOSIS — C3430 Malignant neoplasm of lower lobe, unspecified bronchus or lung: Secondary | ICD-10-CM

## 2013-10-17 DIAGNOSIS — C3490 Malignant neoplasm of unspecified part of unspecified bronchus or lung: Secondary | ICD-10-CM

## 2013-10-17 LAB — COMPREHENSIVE METABOLIC PANEL (CC13)
ALT: 13 U/L (ref 0–55)
AST: 13 U/L (ref 5–34)
Albumin: 3.4 g/dL — ABNORMAL LOW (ref 3.5–5.0)
Alkaline Phosphatase: 100 U/L (ref 40–150)
Anion Gap: 13 mEq/L — ABNORMAL HIGH (ref 3–11)
BUN: 14.5 mg/dL (ref 7.0–26.0)
CALCIUM: 9.1 mg/dL (ref 8.4–10.4)
CHLORIDE: 104 meq/L (ref 98–109)
CO2: 21 meq/L — AB (ref 22–29)
Creatinine: 1.5 mg/dL — ABNORMAL HIGH (ref 0.7–1.3)
Glucose: 161 mg/dl — ABNORMAL HIGH (ref 70–140)
Potassium: 4.2 mEq/L (ref 3.5–5.1)
Sodium: 138 mEq/L (ref 136–145)
Total Bilirubin: 0.56 mg/dL (ref 0.20–1.20)
Total Protein: 6.5 g/dL (ref 6.4–8.3)

## 2013-10-17 LAB — CBC WITH DIFFERENTIAL/PLATELET
BASO%: 0.9 % (ref 0.0–2.0)
BASOS ABS: 0 10*3/uL (ref 0.0–0.1)
EOS%: 2.4 % (ref 0.0–7.0)
Eosinophils Absolute: 0.1 10*3/uL (ref 0.0–0.5)
HEMATOCRIT: 37.8 % — AB (ref 38.4–49.9)
HEMOGLOBIN: 12.7 g/dL — AB (ref 13.0–17.1)
LYMPH#: 0.5 10*3/uL — AB (ref 0.9–3.3)
LYMPH%: 11.8 % — ABNORMAL LOW (ref 14.0–49.0)
MCH: 31.2 pg (ref 27.2–33.4)
MCHC: 33.5 g/dL (ref 32.0–36.0)
MCV: 93.1 fL (ref 79.3–98.0)
MONO#: 0.4 10*3/uL (ref 0.1–0.9)
MONO%: 9.9 % (ref 0.0–14.0)
NEUT#: 3.4 10*3/uL (ref 1.5–6.5)
NEUT%: 75 % (ref 39.0–75.0)
Platelets: 120 10*3/uL — ABNORMAL LOW (ref 140–400)
RBC: 4.06 10*6/uL — ABNORMAL LOW (ref 4.20–5.82)
RDW: 16.2 % — AB (ref 11.0–14.6)
WBC: 4.5 10*3/uL (ref 4.0–10.3)

## 2013-10-17 MED ORDER — IOHEXOL 300 MG/ML  SOLN
80.0000 mL | Freq: Once | INTRAMUSCULAR | Status: AC | PRN
  Administered 2013-10-17: 80 mL via INTRAVENOUS

## 2013-10-24 ENCOUNTER — Ambulatory Visit (HOSPITAL_BASED_OUTPATIENT_CLINIC_OR_DEPARTMENT_OTHER): Payer: BC Managed Care – PPO | Admitting: Internal Medicine

## 2013-10-24 ENCOUNTER — Encounter: Payer: Self-pay | Admitting: Internal Medicine

## 2013-10-24 VITALS — BP 107/70 | HR 95 | Temp 98.0°F | Resp 18 | Ht 70.0 in | Wt 200.4 lb

## 2013-10-24 DIAGNOSIS — Z87891 Personal history of nicotine dependence: Secondary | ICD-10-CM

## 2013-10-24 DIAGNOSIS — C3430 Malignant neoplasm of lower lobe, unspecified bronchus or lung: Secondary | ICD-10-CM

## 2013-10-24 DIAGNOSIS — R5383 Other fatigue: Secondary | ICD-10-CM

## 2013-10-24 DIAGNOSIS — R0989 Other specified symptoms and signs involving the circulatory and respiratory systems: Secondary | ICD-10-CM

## 2013-10-24 DIAGNOSIS — C3490 Malignant neoplasm of unspecified part of unspecified bronchus or lung: Secondary | ICD-10-CM

## 2013-10-24 DIAGNOSIS — R5381 Other malaise: Secondary | ICD-10-CM

## 2013-10-24 DIAGNOSIS — R1310 Dysphagia, unspecified: Secondary | ICD-10-CM

## 2013-10-24 DIAGNOSIS — R0609 Other forms of dyspnea: Secondary | ICD-10-CM

## 2013-10-24 NOTE — Progress Notes (Signed)
[name]  [phone] [phone] OFFICE VISIT PROGRESS NOTE  [name], MD [address] [place] [address]  DIAGNOSIS: Limited stage, Stage IIIA (T2a, N2, M0), Small cell carcinoma of lung, diagnosed in October of 2014, involving the left lower lobe and mediastinal lymphadenopathy.     PRIOR THERAPY:  1) Systemic chemotherapy with carboplatin for an AUC of 5 given on day 1 and etoposide at 120 mg per meter squared given on days 1, 2 and 3 with Neulasta support given on day 4 given every 3 weeks. Status post 4 cycles.  2) prophylactic cranial irradiation completed on 09/29/2013 under the care of Dr. Pablo Ledger.  CURRENT THERAPY: Observation.  DISEASE STAGE: Limited stage small cell lung cancer Small cell carcinoma of lung   Primary site: Lung (Left)   Staging method: AJCC 7th Edition   Clinical: Stage IIIA (T2a, N2, M0)   Summary: Stage IIIA (T2a, N2, M0) Malignant neoplasm of lower lobe of left lung   Primary site: Lung (Left)   Staging method: AJCC 7th Edition   Summary: Incomplete stage  CHEMOTHERAPY INTENT: Palliative  CURRENT # OF CHEMOTHERAPY CYCLES: 4  CURRENT ANTIEMETICS: Zofran, dexamethasone and Compazine  CURRENT SMOKING STATUS: Former smoker, quit 09/09/2009  ORAL CHEMOTHERAPY AND CONSENT: N./A  CURRENT BISPHOSPHONATES USE: None  PAIN MANAGEMENT: None  NARCOTICS INDUCED CONSTIPATION: None  LIVING WILL AND CODE STATUS: ?   INTERVAL HISTORY: [name] 58 y.o. male returns for followup visit accompanied by his niece. The patient is feeling fine today with no specific complaints except for mild odynophagia secondary to radiation treatment in addition to increasing fatigue and shortness of breath with exertion. He recently completed a course of prophylactic cranial irradiation and tolerated it well except for the fatigue. He denied having any fever or chills. He denied having any nausea or  vomiting. The patient has no chest pain, shortness of breath, cough or hemoptysis. He has repeat CT scan of the chest performed recently and he is here for evaluation and discussion of his scan results.  MEDICAL HISTORY: Past Medical History  Diagnosis Date  . CAD (coronary artery disease)     a. PCI 3/08 to OM2 and mid CFX; b.  NSTEMI 6/10:  Xience DES to distal CFX;  c. abnl MV 12/13 => LHC 04/23/12:  Med Rx planned;  d.  NSTEMI - LHC (08/23/13):  Inf and inferolateral HK, EF 40-45%, prox LAD 40-50%, CFX occluded at prox stent edge, OM2 stent occluded, RCA occluded.  PCI:  DES x 2 to CFX  . Diabetes mellitus type II   . Hyperlipidemia   . COPD (chronic obstructive pulmonary disease)     Quit tobacco 09/2009; Gold Stage 2 with asthma component - FEV1 1.53 L/54%, 14% fev1 BD response, DLCO 54% - July 2011; MM genotype 01/07/10; unable to afford spiriva and unwilling to try ics due to prior renal failure: state to Dr. [name], Aug 2011; started on atrovent HFA fall 2011. no desturation on walk test Dec 2011  . CKD (chronic kidney disease)   . Obesity   . History of CVA (cerebrovascular accident) 2007    Without residual deficits  . 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 no further rx  . Hypertension   . Asthma     as child  . Small cell lung cancer     Stage IIIA - s/p chemotherapy and radiation  . Radiation 03/16/13-04/28/13    Left lung 60Gy  . Pneumonia     02/14/2013 in hospital  . History of chemotherapy finished dec 2014  . On home O2     constant/wears 2 liters prn  . Bilateral pulmonary embolism 08-03-2013    tx with Lovenox  . H/O non-ST elevation myocardial infarction (NSTEMI)      10/2008;  08/2013  . Radiation esophagitis     s/p esophageal dilation    ALLERGIES:  is allergic to azithromycin; erythromycin; fentanyl; and penicillins.  MEDICATIONS:  Current Outpatient Prescriptions  Medication Sig Dispense Refill  . albuterol (PROVENTIL HFA;VENTOLIN HFA) 108 (90 BASE) MCG/ACT inhaler Inhale into the lungs every 6 (six) hours as needed for wheezing or shortness of breath.      Marland Kitchen albuterol (PROVENTIL) (2.5 MG/3ML) 0.083% nebulizer solution Take 2.5 mg by nebulization every 6 (six) hours as needed for wheezing or shortness of breath.      . Alum & Mag Hydroxide-Simeth (MAGIC MOUTHWASH W/LIDOCAINE) SOLN Take 5 mLs by mouth 4 (four) times daily as needed for mouth pain.  480 mL  3  . aspirin EC 81 MG EC tablet Take 1 tablet (81 mg total) by mouth daily.      Marland Kitchen atorvastatin (LIPITOR) 40 MG tablet Take 40 mg by mouth daily.       . clopidogrel (PLAVIX) 75 MG tablet Take 1 tablet (75 mg total) by mouth daily with breakfast.  30 tablet  0  . emollient (BIAFINE) cream Apply 1 application topically 2 (two) times daily.      Marland Kitchen enoxaparin (LOVENOX) 150 MG/ML injection Inject 0.93 mLs (140 mg total) into the skin daily.  32 Syringe  0  . feeding supplement, ENSURE, (ENSURE) PUDG Take 1 Container by mouth 3 (three) times daily between meals.    0  . feeding supplement, RESOURCE BREEZE, (RESOURCE BREEZE) LIQD Take 1 Container by mouth 3 (three) times daily between meals. OR EQUIVALENT AVAILABLE AT [facility]/[employer]/DRUG STORE    0  . HYDROcodone-acetaminophen (HYCET) 7.5-325 mg/15 ml solution Take 10 mLs by mouth 4 (four) times daily as needed for moderate pain.  500 mL  0  . isosorbide mononitrate (IMDUR) 60 MG 24 hr tablet Take 1 tablet (60 mg total) by mouth every morning.  30 tablet  6  . metoprolol (LOPRESSOR) 50 MG tablet Take 1 tablet (50 mg total) by mouth 2 (two) times daily.  60 tablet  0  . nitroGLYCERIN (NITROSTAT) 0.4 MG SL tablet Place 0.4 mg under the tongue every 5 (five)  minutes as needed for chest pain. May repeat up to 3 doses      . pantoprazole (PROTONIX) 40 MG tablet Take 40 mg by mouth 2 (two) times daily.      . ranolazine (RANEXA) 500 MG 12 hr tablet Take 1 tablet (500 mg total) by mouth 2 (two) times daily.  60 tablet  6   No current facility-administered medications for this visit.    SURGICAL HISTORY:  Past Surgical History  Procedure Laterality Date  . Wedge resection  03/28/2010  . Coronary stent placement  2009    x 2  . Cardiac catheterization  2009  . Endobronchial ultrasound Bilateral 02/28/2013    Procedure: ENDOBRONCHIAL ULTRASOUND;  Surgeon: Brand Males, MD;  Location: [facility];  Service: Cardiopulmonary;  Laterality: Bilateral;  . Esophagogastroduodenoscopy N/A 05/04/2013    Procedure: ESOPHAGOGASTRODUODENOSCOPY (EGD);  Surgeon: Inda Castle, MD;  Location: [facility];  Service: Endoscopy;  Laterality: N/A;  . Esophagogastroduodenoscopy N/A 07/06/2013    Procedure: ESOPHAGOGASTRODUODENOSCOPY (EGD);  Surgeon: Inda Castle, MD;  Location: [facility];  Service: Endoscopy;  Laterality: N/A;  . Savory dilation N/A 07/06/2013    Procedure: SAVORY DILATION;  Surgeon: Inda Castle, MD;  Location: [facility];  Service: Endoscopy;  Laterality: N/A;  . Esophagogastroduodenoscopy N/A 07/14/2013    Procedure: ESOPHAGOGASTRODUODENOSCOPY (EGD);  Surgeon: Inda Castle, MD;  Location: [facility];  Service: Endoscopy;  Laterality: N/A;  . Savory dilation N/A 07/14/2013    Procedure: SAVORY DILATION;  Surgeon: Inda Castle, MD;  Location: [facility];  Service: Endoscopy;  Laterality: N/A;  . Esophagogastroduodenoscopy N/A 07/26/2013    Procedure: ESOPHAGOGASTRODUODENOSCOPY (EGD);  Surgeon: Inda Castle, MD;  Location: [facility];  Service: Endoscopy;  Laterality: N/A;  . Savory dilation N/A 07/26/2013    Procedure: SAVORY DILATION;  Surgeon: Inda Castle, MD;  Location: [facility];  Service: Endoscopy;  Laterality:  N/A;    REVIEW OF SYSTEMS:  A comprehensive review of systems was negative except for: Respiratory: positive for dyspnea on exertion Gastrointestinal: positive for dysphagia   PHYSICAL EXAMINATION: General appearance: alert, cooperative, appears stated age and no distress Head: Normocephalic, without obvious abnormality, atraumatic Neck: no adenopathy, no carotid bruit, no JVD, supple, symmetrical, trachea midline and thyroid not enlarged, symmetric, no tenderness/mass/nodules Lymph nodes: Cervical, supraclavicular, and axillary nodes normal. Resp: clear to auscultation bilaterally Cardio: regular rate and rhythm, S1, S2 normal, no murmur, click, rub or gallop GI: soft, non-tender; bowel sounds normal; no masses,  no organomegaly Extremities: extremities normal, atraumatic, no cyanosis or edema Neurologic: Alert and oriented X 3, normal strength and tone. Normal symmetric reflexes. Normal coordination and gait  ECOG PERFORMANCE STATUS: 1 - Symptomatic but completely ambulatory  Blood pressure 107/70, pulse 95, temperature 98 F (36.7 C), temperature source Oral, resp. rate 18, height 5\' 10"  (1.778 m), weight 200 lb 6.4 oz (90.901 kg), SpO2 96.00%.  LABORATORY DATA: Lab Results  Component Value Date   WBC 4.5 10/17/2013   HGB 12.7* 10/17/2013   HCT 37.8* 10/17/2013   MCV 93.1 10/17/2013   PLT 120* 10/17/2013      Chemistry      Component Value Date/Time   NA 138 10/17/2013 0858   NA 140 08/25/2013 0327   K 4.2 10/17/2013 0858   K 3.9 08/25/2013 0327   CL 102 08/25/2013 0327   CO2 21* 10/17/2013 0858   CO2 24 08/25/2013 0327   BUN 14.5 10/17/2013 0858   BUN 9 08/25/2013 0327   CREATININE 1.5* 10/17/2013 0858   CREATININE 1.27 08/25/2013 0327   CREATININE 1.46* 12/10/2010 0843      Component Value Date/Time   CALCIUM 9.1 10/17/2013 0858   CALCIUM 9.3 08/25/2013 0327   ALKPHOS 100 10/17/2013 0858   ALKPHOS 102 05/05/2013 0405   AST 13 10/17/2013 0858   AST 17 05/05/2013 0405   ALT 13 10/17/2013 0858    ALT 14 05/05/2013 0405   BILITOT 0.56 10/17/2013 0858   BILITOT 0.6 05/05/2013 0405       RADIOGRAPHIC STUDIES: Ct Chest W Contrast  10/17/2013   CLINICAL DATA:  Small cell lung cancer.  EXAM: CT CHEST WITH CONTRAST  TECHNIQUE: Multidetector CT imaging of  the chest was performed during intravenous contrast administration.  CONTRAST:  79mL OMNIPAQUE IOHEXOL 300 MG/ML  SOLN  COMPARISON:  08/20/2013.  FINDINGS: Soft tissue / Mediastinum: Pulmonary embolic disease seen previously in the left main pulmonary artery has decreased in the interval.  There is no axillary lymphadenopathy. 13 mm short axis right paratracheal lymph node measured previously is 9 mm in short axis today on image 14 of series 6. No other mediastinal lymphadenopathy. No hilar lymphadenopathy. Small lymph nodes adjacent to the distal esophagus are stable. There is mild circumferential wall thickening in the mid and distal esophagus. Trace pericardial effusion is again noted,  Lungs / Pleura: Emphysema is evident, as before. The lingular nodule measured previously at 19 mm now measures 8 mm in the same dimension. The ill-defined soft tissue attenuation seen in the right suprahilar region on the previous study is slightly decreased in the interval and likely reflects post treatment change. The posterior left lower lobe 14 mm nodule measured previously has decreased to 8 mm today. No new or enlarging pulmonary nodule.  Bones: Bone windows reveal no worrisome lytic or sclerotic osseous lesions. Multiple right-sided rib fractures are evident.  Upper Abdomen:  Unremarkable.  IMPRESSION: Interval decrease in the lingular and left lower lobe pulmonary nodules. No new or progressive nodules on today's study.  Continued further decrease in left pulmonary arterial thrombus.  Interval decrease in right paratracheal lymph nodes.  Marked emphysema with stable small pericardial effusion.   Electronically Signed   By: Misty Stanley M.D.   On: 10/17/2013 12:46     ASSESSMENT/PLAN: This is a very pleasant 58 years old white male with limited stage small cell lung cancer currently undergoing systemic chemotherapy concurrent with radiation, status post 4 cycles. The patient is tolerating his chemotherapy fairly well with no significant adverse effects.  He recently completed prophylactic cranial irradiation. The patient is feeling fine today except for fatigue and shortness breath with exertion. His recent CT scan of the chest showed further decrease in the size of his tumor in the lingula and mediastinal lymph nodes as well as decrease in the left pulmonary arterial thrombus. I discussed the scan results with the patient and his niece. I recommended for him to continue on observation with repeat CT scan of the chest in 3 months.  He was advised to call immediately if she has any concerning symptoms in the interval.  All questions were answered. The patient knows to call the clinic with any problems, questions or concerns. We can certainly see the patient much sooner if necessary.  Disclaimer: This note was dictated with voice recognition software. Similar sounding words can inadvertently be transcribed and may not be corrected upon review.   Eilleen Kempf., MD 10/24/2013

## 2013-10-28 ENCOUNTER — Other Ambulatory Visit: Payer: Self-pay | Admitting: Internal Medicine

## 2013-11-02 ENCOUNTER — Other Ambulatory Visit: Payer: Self-pay | Admitting: *Deleted

## 2013-11-02 NOTE — Telephone Encounter (Signed)
Received refill request for metoprolol. Dr. Julien Nordmann never ordered this medication. Was originally ordered by a Dr. Loanne Drilling, who declined to refill (hospital physician). Spoke with pharmacy and suggested this be sent to PCP.

## 2013-11-04 ENCOUNTER — Other Ambulatory Visit: Payer: Self-pay

## 2013-11-04 MED ORDER — METOPROLOL TARTRATE 50 MG PO TABS: 50.0000 mg | ORAL_TABLET | Freq: Two times a day (BID) | ORAL | Status: DC

## 2013-11-10 ENCOUNTER — Telehealth: Payer: Self-pay | Admitting: *Deleted

## 2013-11-10 ENCOUNTER — Ambulatory Visit: Payer: BC Managed Care – PPO | Admitting: Radiation Oncology

## 2013-11-10 ENCOUNTER — Ambulatory Visit
Admission: RE | Admit: 2013-11-10 | Discharge: 2013-11-10 | Disposition: A | Discharge status: Home / Self Care | Payer: BC Managed Care – PPO | Source: Ambulatory Visit | Attending: Radiation Oncology | Admitting: Radiation Oncology

## 2013-11-10 DIAGNOSIS — C3492 Malignant neoplasm of unspecified part of left bronchus or lung: Secondary | ICD-10-CM

## 2013-11-10 NOTE — Telephone Encounter (Signed)
Called patient to ask if he wanted to come in early we had 2 cancellations this afternoon, he stated"I'm on my way right now", will page him down when he registers,patient thanked me for calling 3:38 PM

## 2013-11-10 NOTE — Progress Notes (Signed)
Follow up rad txs 09/14/13,09/29/13, cranial , head skin intact, some dark black discoloration on back of head, coughs up little clear sputum, very sob with any exertion 99% room air sats after sitting  A few minutes, no pain, appetite comes and goes,  No thrush seen,  Some fatigue, ct chest done on 10/17/13, results in, no headache ,very rare occasions stated patient, vision good,  4:26 PM

## 2013-11-15 NOTE — Progress Notes (Signed)
Department of Radiation Oncology  Phone:  [phone] Fax:        [phone]   Name: [name] MRN: [id number]  DOB: 07/06/1955  Date: 11/10/2013  Follow Up Visit Note  Diagnosis: Limited stage small cell lung cancer  Summary and Interval since last radiation: 60 Gy completed 12/15 to the left lung followed by PCI to 24 Gy completed 09/29/13  Interval History: [name] is doing well. He had some issues with a renter in his house and is dealing with that. He had a recent restaging scan which showed no evidence of new or progressive disease. He is eating well and not having any dysphagia. He has follow up scheduled with medical oncology and GI. He is trying to be more active and "do a little more" each day. He has no headache and no pain.   Allergies:  Allergies  Allergen Reactions  . Azithromycin     Reaction while in hospital - pt doesn't know reaction  . Erythromycin Other (See Comments)    Reaction while in hospital - pt doesn't know reaction  . Fentanyl Other (See Comments)    Delirium and violent  . Penicillins Other (See Comments)    Childhood reaction - unknown    Medications:  Current Outpatient Prescriptions  Medication Sig Dispense Refill  . albuterol (PROVENTIL HFA;VENTOLIN HFA) 108 (90 BASE) MCG/ACT inhaler Inhale into the lungs every 6 (six) hours as needed for wheezing or shortness of breath.      Marland Kitchen albuterol (PROVENTIL) (2.5 MG/3ML) 0.083% nebulizer solution Take 2.5 mg by nebulization every 6 (six) hours as needed for wheezing or shortness of breath.      Marland Kitchen aspirin EC 81 MG EC tablet Take 1 tablet (81 mg total) by mouth daily.      Marland Kitchen atorvastatin (LIPITOR) 40 MG tablet Take 40 mg by mouth daily.       . clopidogrel (PLAVIX) 75 MG tablet Take 1 tablet (75 mg total) by mouth daily with breakfast.  30 tablet  0  . emollient (BIAFINE) cream Apply 1 application topically 2 (two) times daily.      Marland Kitchen enoxaparin (LOVENOX) 150 MG/ML injection INJECT 0.93 MLS (140  MG TOTAL) INTO THE SKIN DAILY  30 mL  1  . feeding supplement, ENSURE, (ENSURE) PUDG Take 1 Container by mouth 3 (three) times daily between meals.    0  . feeding supplement, RESOURCE BREEZE, (RESOURCE BREEZE) LIQD Take 1 Container by mouth 3 (three) times daily between meals. OR EQUIVALENT AVAILABLE AT [facility]/[employer]/DRUG STORE    0  . HYDROcodone-acetaminophen (HYCET) 7.5-325 mg/15 ml solution Take 10 mLs by mouth 4 (four) times daily as needed for moderate pain.  500 mL  0  . isosorbide mononitrate (IMDUR) 60 MG 24 hr tablet Take 1 tablet (60 mg total) by mouth every morning.  30 tablet  6  . metoprolol (LOPRESSOR) 50 MG tablet Take 1 tablet (50 mg total) by mouth 2 (two) times daily.  60 tablet  2  . nitroGLYCERIN (NITROSTAT) 0.4 MG SL tablet Place 0.4 mg under the tongue every 5 (five) minutes as needed for chest pain. May repeat up to 3 doses      . pantoprazole (PROTONIX) 40 MG tablet Take 40 mg by mouth 2 (two) times daily.      . ranolazine (RANEXA) 500 MG 12 hr tablet Take 1 tablet (500 mg total) by mouth 2 (two) times daily.  60 tablet  6  . Alum & Mag  Hydroxide-Simeth (MAGIC MOUTHWASH W/LIDOCAINE) SOLN Take 5 mLs by mouth 4 (four) times daily as needed for mouth pain.  480 mL  3   No current facility-administered medications for this encounter.    Physical Exam:  There were no vitals filed for this visit. is a male in no distress sitting comfortably in a wheelchair.  He is not wearing his nasal cannula and has normal respiratory effort. 5 out of 5 strength bilaterally. And oriented x3.  IMPRESSION: [name] is a 58 y.o. male  Status post concurrent chemoradiation and prophylactic cranial irradiation with resolving acute effects of treatment.   PLAN: Doing well. Follow up with medical oncology,. Here PRN.    Thea Silversmith, MD

## 2013-11-29 LAB — CBC AND DIFFERENTIAL
HCT: 39 % — AB (ref 41–53)
HEMOGLOBIN: 13.6 g/dL (ref 13.5–17.5)
Platelets: 123 10*3/uL — AB (ref 150–399)
WBC: 5.2 10^3/mL

## 2013-11-30 ENCOUNTER — Ambulatory Visit (INDEPENDENT_AMBULATORY_CARE_PROVIDER_SITE_OTHER): Payer: BC Managed Care – PPO | Admitting: Gastroenterology

## 2013-11-30 ENCOUNTER — Encounter: Payer: Self-pay | Admitting: Gastroenterology

## 2013-11-30 VITALS — BP 100/68 | HR 96 | Ht 68.5 in | Wt 202.2 lb

## 2013-11-30 DIAGNOSIS — K222 Esophageal obstruction: Secondary | ICD-10-CM

## 2013-11-30 DIAGNOSIS — I2699 Other pulmonary embolism without acute cor pulmonale: Secondary | ICD-10-CM

## 2013-11-30 DIAGNOSIS — K208 Other esophagitis without bleeding: Secondary | ICD-10-CM

## 2013-11-30 DIAGNOSIS — I214 Non-ST elevation (NSTEMI) myocardial infarction: Secondary | ICD-10-CM

## 2013-11-30 DIAGNOSIS — R110 Nausea: Secondary | ICD-10-CM

## 2013-11-30 MED ORDER — ONDANSETRON HCL 4 MG PO TABS: 4.0000 mg | ORAL_TABLET | Freq: Three times a day (TID) | ORAL | Status: DC | PRN

## 2013-11-30 NOTE — Patient Instructions (Signed)
Follow up as needed Prescription will be sent to your pharmacy

## 2013-11-30 NOTE — Progress Notes (Signed)
          History of Present Illness:  The patient has returned for followup of his esophageal stricture.  He underwent sequential dilation of a radiation-induced stricture.  His was interrupted because of an acute MI for which she underwent stent placement and was placed on Plavix.  He has dysphagia but is able to manage by eating slowly.  His main complaint is intermittent nausea.  Should he have any vomiting he developed severe burning chest discomfort.  This rapidly improves with Magic mouthwash    Review of Systems: Pertinent positive and negative review of systems were noted in the above HPI section. All other review of systems were otherwise negative.    Current Medications, Allergies, Past Medical History, Past Surgical History, Family History and Social History were reviewed in [facility] record  Vital signs were reviewed in today's medical record. Physical Exam: General: Chronically ill-appearing and no acute distress Skin: anicteric   See Assessment and Plan under Problem List

## 2013-11-30 NOTE — Assessment & Plan Note (Signed)
Patient remains symptomatic but is able to manage.  Dilatation has been halted because of his acute MI and requirement for Plavix.

## 2013-11-30 NOTE — Assessment & Plan Note (Signed)
Nausea is probably related to radiation therapy.  Plan expectant management with Zofran

## 2013-12-05 ENCOUNTER — Ambulatory Visit: Payer: Self-pay | Admitting: Cardiology

## 2013-12-21 ENCOUNTER — Encounter: Payer: Self-pay | Admitting: Cardiology

## 2013-12-26 ENCOUNTER — Other Ambulatory Visit: Payer: Self-pay | Admitting: Cardiology

## 2013-12-27 ENCOUNTER — Other Ambulatory Visit: Payer: Self-pay

## 2014-01-20 ENCOUNTER — Other Ambulatory Visit: Payer: Self-pay

## 2014-01-20 MED ORDER — CLOPIDOGREL BISULFATE 75 MG PO TABS: 75.0000 mg | ORAL_TABLET | Freq: Every day | ORAL | Status: DC

## 2014-01-23 ENCOUNTER — Other Ambulatory Visit: Payer: Self-pay | Admitting: *Deleted

## 2014-01-23 ENCOUNTER — Ambulatory Visit (HOSPITAL_COMMUNITY): Admission: RE | Admit: 2014-01-23 | Payer: BC Managed Care – PPO | Source: Ambulatory Visit

## 2014-01-24 ENCOUNTER — Ambulatory Visit (HOSPITAL_COMMUNITY)
Admission: RE | Admit: 2014-01-24 | Discharge: 2014-01-24 | Disposition: A | Discharge status: Home / Self Care | Payer: BC Managed Care – PPO | Source: Ambulatory Visit | Attending: Internal Medicine | Admitting: Internal Medicine

## 2014-01-24 ENCOUNTER — Telehealth: Payer: Self-pay | Admitting: Hematology and Oncology

## 2014-01-24 ENCOUNTER — Other Ambulatory Visit: Payer: Self-pay | Admitting: Internal Medicine

## 2014-01-24 DIAGNOSIS — C3490 Malignant neoplasm of unspecified part of unspecified bronchus or lung: Secondary | ICD-10-CM

## 2014-01-24 LAB — COMPREHENSIVE METABOLIC PANEL
ALT: 13 U/L (ref 0–53)
ANION GAP: 12 (ref 5–15)
AST: 15 U/L (ref 0–37)
Albumin: 3.4 g/dL — ABNORMAL LOW (ref 3.5–5.2)
Alkaline Phosphatase: 113 U/L (ref 39–117)
BUN: 22 mg/dL (ref 6–23)
CO2: 26 mEq/L (ref 19–32)
CREATININE: 1.96 mg/dL — AB (ref 0.50–1.35)
Calcium: 9.3 mg/dL (ref 8.4–10.5)
Chloride: 98 mEq/L (ref 96–112)
GFR calc Af Amer: 42 mL/min — ABNORMAL LOW (ref >90)
GFR calc non Af Amer: 36 mL/min — ABNORMAL LOW (ref >90)
Glucose, Bld: 163 mg/dL — ABNORMAL HIGH (ref 70–99)
Potassium: 4.7 mEq/L (ref 3.7–5.3)
Sodium: 136 mEq/L — ABNORMAL LOW (ref 137–147)
TOTAL PROTEIN: 7 g/dL (ref 6.0–8.3)
Total Bilirubin: 0.6 mg/dL (ref 0.3–1.2)

## 2014-01-26 ENCOUNTER — Telehealth: Payer: Self-pay | Admitting: Medical Oncology

## 2014-01-26 ENCOUNTER — Ambulatory Visit: Payer: Self-pay | Admitting: Hematology and Oncology

## 2014-01-26 NOTE — Telephone Encounter (Signed)
Asking for CT results and needs disability papers signed.note to [place].

## 2014-01-30 ENCOUNTER — Telehealth: Payer: Self-pay | Admitting: *Deleted

## 2014-01-30 DIAGNOSIS — R110 Nausea: Secondary | ICD-10-CM

## 2014-01-30 MED ORDER — ONDANSETRON HCL 4 MG PO TABS: 4.0000 mg | ORAL_TABLET | Freq: Three times a day (TID) | ORAL | Status: DC | PRN

## 2014-01-30 NOTE — Telephone Encounter (Signed)
Med sent.

## 2014-02-01 ENCOUNTER — Telehealth: Payer: Self-pay | Admitting: Physician Assistant

## 2014-02-01 ENCOUNTER — Other Ambulatory Visit: Payer: Self-pay | Admitting: *Deleted

## 2014-02-01 NOTE — Telephone Encounter (Signed)
, °

## 2014-02-02 ENCOUNTER — Telehealth: Payer: Self-pay | Admitting: Physician Assistant

## 2014-02-02 ENCOUNTER — Other Ambulatory Visit (HOSPITAL_BASED_OUTPATIENT_CLINIC_OR_DEPARTMENT_OTHER): Payer: BC Managed Care – PPO

## 2014-02-02 ENCOUNTER — Encounter: Payer: Self-pay | Admitting: Physician Assistant

## 2014-02-02 ENCOUNTER — Ambulatory Visit (HOSPITAL_BASED_OUTPATIENT_CLINIC_OR_DEPARTMENT_OTHER): Payer: BC Managed Care – PPO | Admitting: Physician Assistant

## 2014-02-02 VITALS — BP 104/58 | HR 79 | Temp 97.8°F | Resp 20 | Ht 68.5 in | Wt 202.7 lb

## 2014-02-02 DIAGNOSIS — C3490 Malignant neoplasm of unspecified part of unspecified bronchus or lung: Secondary | ICD-10-CM

## 2014-02-02 DIAGNOSIS — C3430 Malignant neoplasm of lower lobe, unspecified bronchus or lung: Secondary | ICD-10-CM

## 2014-02-02 LAB — COMPREHENSIVE METABOLIC PANEL (CC13)
ALK PHOS: 120 U/L (ref 40–150)
ALT: 12 U/L (ref 0–55)
AST: 13 U/L (ref 5–34)
Albumin: 3.3 g/dL — ABNORMAL LOW (ref 3.5–5.0)
Anion Gap: 8 mEq/L (ref 3–11)
BILIRUBIN TOTAL: 0.51 mg/dL (ref 0.20–1.20)
BUN: 18.4 mg/dL (ref 7.0–26.0)
CO2: 24 mEq/L (ref 22–29)
Calcium: 9.4 mg/dL (ref 8.4–10.4)
Chloride: 102 mEq/L (ref 98–109)
Creatinine: 1.8 mg/dL — ABNORMAL HIGH (ref 0.7–1.3)
Glucose: 217 mg/dl — ABNORMAL HIGH (ref 70–140)
Potassium: 5.2 mEq/L — ABNORMAL HIGH (ref 3.5–5.1)
SODIUM: 134 meq/L — AB (ref 136–145)
TOTAL PROTEIN: 6.9 g/dL (ref 6.4–8.3)

## 2014-02-02 LAB — CBC WITH DIFFERENTIAL/PLATELET
BASO%: 0.7 % (ref 0.0–2.0)
BASOS ABS: 0 10*3/uL (ref 0.0–0.1)
EOS ABS: 0.2 10*3/uL (ref 0.0–0.5)
EOS%: 3.1 % (ref 0.0–7.0)
HEMATOCRIT: 38.2 % — AB (ref 38.4–49.9)
HGB: 12.9 g/dL — ABNORMAL LOW (ref 13.0–17.1)
LYMPH%: 10.3 % — AB (ref 14.0–49.0)
MCH: 31.5 pg (ref 27.2–33.4)
MCHC: 33.8 g/dL (ref 32.0–36.0)
MCV: 93.3 fL (ref 79.3–98.0)
MONO#: 0.5 10*3/uL (ref 0.1–0.9)
MONO%: 8.8 % (ref 0.0–14.0)
NEUT#: 4 10*3/uL (ref 1.5–6.5)
NEUT%: 77.1 % — AB (ref 39.0–75.0)
PLATELETS: 167 10*3/uL (ref 140–400)
RBC: 4.1 10*6/uL — ABNORMAL LOW (ref 4.20–5.82)
RDW: 15.5 % — ABNORMAL HIGH (ref 11.0–14.6)
WBC: 5.2 10*3/uL (ref 4.0–10.3)
lymph#: 0.5 10*3/uL — ABNORMAL LOW (ref 0.9–3.3)

## 2014-02-02 NOTE — Telephone Encounter (Signed)
Pt confirmed labs/ov per 09/24 POF,  gave pt AVS         KJ °

## 2014-02-02 NOTE — Progress Notes (Signed)
[place]  [phone] Fax:(336) [phone]  OFFICE VISIT PROGRESS NOTE  [name], MD [address] [place] [address]  DIAGNOSIS: Limited stage, Stage IIIA (T2a, N2, M0), Small cell carcinoma of lung, diagnosed in October of 2014, involving the left lower lobe and mediastinal lymphadenopathy.     PRIOR THERAPY:  1) Systemic chemotherapy with carboplatin for an AUC of 5 given on day 1 and etoposide at 120 mg per meter squared given on days 1, 2 and 3 with Neulasta support given on day 4 given every 3 weeks. Status post 4 cycles.  2) prophylactic cranial irradiation completed on 09/29/2013 under the care of Dr. Pablo Ledger.  CURRENT THERAPY: Observation.  DISEASE STAGE: Limited stage small cell lung cancer Small cell carcinoma of lung   Primary site: Lung (Left)   Staging method: AJCC 7th Edition   Clinical: Stage IIIA (T2a, N2, M0)   Summary: Stage IIIA (T2a, N2, M0) Malignant neoplasm of lower lobe of left lung   Primary site: Lung (Left)   Staging method: AJCC 7th Edition   Summary: Incomplete stage  CHEMOTHERAPY INTENT: Palliative  CURRENT # OF CHEMOTHERAPY CYCLES: 4  CURRENT ANTIEMETICS: Zofran, dexamethasone and Compazine  CURRENT SMOKING STATUS: Former smoker, quit 09/09/2009  ORAL CHEMOTHERAPY AND CONSENT: N./A  CURRENT BISPHOSPHONATES USE: None  PAIN MANAGEMENT: None  NARCOTICS INDUCED CONSTIPATION: None  LIVING WILL AND CODE STATUS: ?   INTERVAL HISTORY: [name] 58 y.o. male returns for followup visit unaccompanied.The patient is feeling fine today with no specific complaints except for occasional episodes of nausea and vomiting. He reports episodes of coughing from esophageal spasm that lead to nausea and vomiting. His gastroenterologist has him on Zofran 4 mg tablets with reasonable control of his symptoms. He voiced no other complaints today. He denied having any fever or chills. He denied  having any nausea or vomiting. The patient has no chest pain, shortness of breath, cough or hemoptysis. He had a repeat CT scan of the chest performed recently and he is here for evaluation and discussion of his scan results.  MEDICAL HISTORY: Past Medical History  Diagnosis Date  . CAD (coronary artery disease)     a. PCI 3/08 to OM2 and mid CFX; b.  NSTEMI 6/10:  Xience DES to distal CFX;  c. abnl MV 12/13 => LHC 04/23/12:  Med Rx planned;  d.  NSTEMI - LHC (08/23/13):  Inf and inferolateral HK, EF 40-45%, prox LAD 40-50%, CFX occluded at prox stent edge, OM2 stent occluded, RCA occluded.  PCI:  DES x 2 to CFX  . Diabetes mellitus type II   . Hyperlipidemia   . COPD (chronic obstructive pulmonary disease)     Quit tobacco 09/2009; Gold Stage 2 with asthma component - FEV1 1.53 L/54%, 14% fev1 BD response, DLCO 54% - July 2011; MM genotype 01/07/10; unable to afford spiriva and unwilling to try ics due to prior renal failure: state to Dr. [name], Aug 2011; started on atrovent HFA fall 2011. no desturation on walk test Dec 2011  . CKD (chronic kidney disease)   . Obesity   . History of CVA (cerebrovascular accident) 2007    Without residual deficits  . 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 no further rx  . Hypertension   . Asthma     as child  . Small cell lung cancer     Stage IIIA - s/p chemotherapy and radiation  . Radiation 03/16/13-04/28/13    Left lung 60Gy  . Pneumonia     02/14/2013 in hospital  . History of chemotherapy finished dec 2014  . On home O2     constant/wears 2 liters prn  . Bilateral pulmonary embolism 08-03-2013    tx with Lovenox  . H/O non-ST elevation myocardial  infarction (NSTEMI)     10/2008;  08/2013  . Radiation esophagitis     s/p esophageal dilation  . Radiation 09/13/13-09/29/13    Whole Brain Prophylactic 24 Gy in 12 Fx's    ALLERGIES:  is allergic to azithromycin; erythromycin; fentanyl; and penicillins.  MEDICATIONS:  Current Outpatient Prescriptions  Medication Sig Dispense Refill  . albuterol (PROVENTIL HFA;VENTOLIN HFA) 108 (90 BASE) MCG/ACT inhaler Inhale into the lungs every 6 (six) hours as needed for wheezing or shortness of breath.      Marland Kitchen albuterol (PROVENTIL) (2.5 MG/3ML) 0.083% nebulizer solution Take 2.5 mg by nebulization every 6 (six) hours as needed for wheezing or shortness of breath.      Marland Kitchen aspirin EC 81 MG EC tablet Take 1 tablet (81 mg total) by mouth daily.      Marland Kitchen atorvastatin (LIPITOR) 40 MG tablet TAKE  ONE TABLET BY MOUTH NIGHTLY AT BEDTIME  30 tablet  0  . clopidogrel (PLAVIX) 75 MG tablet Take 1 tablet (75 mg total) by mouth daily with breakfast.  30 tablet  3  . enoxaparin (LOVENOX) 150 MG/ML injection INJECT 0.93 MLS (140 MG TOTAL) INTO THE SKIN DAILY  30 mL  1  . isosorbide mononitrate (IMDUR) 60 MG 24 hr tablet Take 1 tablet (60 mg total) by mouth every morning.  30 tablet  6  . metoprolol (LOPRESSOR) 50 MG tablet Take 1 tablet (50 mg total) by mouth 2 (two) times daily.  60 tablet  2  . ondansetron (ZOFRAN) 4 MG tablet Take 1 tablet (4 mg total) by mouth every 8 (eight) hours as needed for nausea or vomiting.  30 tablet  1  . pantoprazole (PROTONIX) 40 MG tablet Take 40 mg by mouth 2 (two) times daily.      . ranolazine (RANEXA) 500 MG 12 hr tablet Take 1 tablet (500 mg total) by mouth 2 (two) times daily.  60 tablet  6  . emollient (BIAFINE) cream Apply 1 application topically 2 (two) times daily.      . nitroGLYCERIN (NITROSTAT) 0.4 MG SL tablet Place 0.4 mg under the tongue every 5 (five) minutes as needed for chest pain. May repeat up to 3 doses       No current facility-administered medications for this  visit.    SURGICAL HISTORY:  Past Surgical History  Procedure Laterality Date  . Wedge resection  03/28/2010  . Coronary stent placement  2009    x 2  . Cardiac catheterization  2009  . Endobronchial ultrasound Bilateral 02/28/2013    Procedure: ENDOBRONCHIAL ULTRASOUND;  Surgeon: Brand Males, MD;  Location: [facility];  Service: Cardiopulmonary;  Laterality: Bilateral;  . Esophagogastroduodenoscopy N/A 05/04/2013    Procedure: ESOPHAGOGASTRODUODENOSCOPY (EGD);  Surgeon: Inda Castle, MD;  Location: [facility];  Service: Endoscopy;  Laterality: N/A;  . Esophagogastroduodenoscopy N/A 07/06/2013    Procedure: ESOPHAGOGASTRODUODENOSCOPY (EGD);  Surgeon: Herbie Baltimore  Shaaron Adler, MD;  Location: [facility];  Service: Endoscopy;  Laterality: N/A;  . Savory dilation N/A 07/06/2013    Procedure: SAVORY DILATION;  Surgeon: Inda Castle, MD;  Location: [facility];  Service: Endoscopy;  Laterality: N/A;  . Esophagogastroduodenoscopy N/A 07/14/2013    Procedure: ESOPHAGOGASTRODUODENOSCOPY (EGD);  Surgeon: Inda Castle, MD;  Location: [facility];  Service: Endoscopy;  Laterality: N/A;  . Savory dilation N/A 07/14/2013    Procedure: SAVORY DILATION;  Surgeon: Inda Castle, MD;  Location: [facility];  Service: Endoscopy;  Laterality: N/A;  . Esophagogastroduodenoscopy N/A 07/26/2013    Procedure: ESOPHAGOGASTRODUODENOSCOPY (EGD);  Surgeon: Inda Castle, MD;  Location: [facility];  Service: Endoscopy;  Laterality: N/A;  . Savory dilation N/A 07/26/2013    Procedure: SAVORY DILATION;  Surgeon: Inda Castle, MD;  Location: [facility];  Service: Endoscopy;  Laterality: N/A;    REVIEW OF SYSTEMS:  A comprehensive review of systems was negative except for: Respiratory: positive for dyspnea on exertion Gastrointestinal: positive for nausea and vomiting   PHYSICAL EXAMINATION: General appearance: alert, cooperative, appears stated age and no distress Head: Normocephalic, without  obvious abnormality, atraumatic Neck: no adenopathy, no carotid bruit, no JVD, supple, symmetrical, trachea midline and thyroid not enlarged, symmetric, no tenderness/mass/nodules Lymph nodes: Cervical, supraclavicular, and axillary nodes normal. Resp: clear to auscultation bilaterally Cardio: regular rate and rhythm, S1, S2 normal, no murmur, click, rub or gallop GI: soft, non-tender; bowel sounds normal; no masses,  no organomegaly Extremities: extremities normal, atraumatic, no cyanosis or edema Neurologic: Alert and oriented X 3, normal strength and tone. Normal symmetric reflexes. Normal coordination and gait  ECOG PERFORMANCE STATUS: 1 - Symptomatic but completely ambulatory  Blood pressure 104/58, pulse 79, temperature 97.8 F (36.6 C), temperature source Oral, resp. rate 20, height 5' 8.5" (1.74 m), weight 202 lb 11.2 oz (91.944 kg).  LABORATORY DATA: Lab Results  Component Value Date   WBC 5.2 02/02/2014   HGB 12.9* 02/02/2014   HCT 38.2* 02/02/2014   MCV 93.3 02/02/2014   PLT 167 02/02/2014      Chemistry      Component Value Date/Time   NA 134* 02/02/2014 1324   NA 136* 01/24/2014 0742   K 5.2* 02/02/2014 1324   K 4.7 01/24/2014 0742   CL 98 01/24/2014 0742   CO2 24 02/02/2014 1324   CO2 26 01/24/2014 0742   BUN 18.4 02/02/2014 1324   BUN 22 01/24/2014 0742   CREATININE 1.8* 02/02/2014 1324   CREATININE 1.96* 01/24/2014 0742   CREATININE 1.46* 12/10/2010 0843      Component Value Date/Time   CALCIUM 9.4 02/02/2014 1324   CALCIUM 9.3 01/24/2014 0742   ALKPHOS 120 02/02/2014 1324   ALKPHOS 113 01/24/2014 0742   AST 13 02/02/2014 1324   AST 15 01/24/2014 0742   ALT 12 02/02/2014 1324   ALT 13 01/24/2014 0742   BILITOT 0.51 02/02/2014 1324   BILITOT 0.6 01/24/2014 0742       RADIOGRAPHIC STUDIES: Ct Chest Wo Contrast  01/24/2014   CLINICAL DATA:  History of lung cancer  EXAM: CT CHEST WITHOUT CONTRAST  TECHNIQUE: Multidetector CT imaging of the chest was performed following the  standard protocol without IV contrast.  COMPARISON:  Chest CT 10/17/2013  FINDINGS: Visualized thyroid is unremarkable. No axillary lymphadenopathy. Grossly stable right peritracheal lymph node measuring approximately 10 mm (image 11; series 2), previously 9 mm. Small pericardial effusion. Normal heart size. Coronary arterial calcifications. Normal caliber aorta  and main pulmonary artery.  Central airways are patent. Emphysematous change. Unchanged 8 mm nodule within the lingula (image 24; series 5). Decreased subpleural left lower lobe nodule measuring 8 mm, previously 14 mm (image 38; series 5). Unchanged ill-defined soft tissue within the right suprahilar region. No new or enlarging pulmonary nodules identified.  No aggressive or acute appearing osseous lesions. Multiple right-sided rib fractures. Unremarkable upper abdomen.  IMPRESSION: No new or progressive nodules on current examination. Slight interval decrease in size of left lower lobe pulmonary nodule.  Stable right paratracheal lymph nodes.   Electronically Signed   By: Lovey Newcomer M.D.   On: 01/24/2014 09:35   ASSESSMENT/PLAN: This is a very pleasant 58 years old white male with limited stage small cell lung cancer currently undergoing systemic chemotherapy concurrent with radiation, status post 4 cycles. The patient is tolerating his chemotherapy fairly well with no significant adverse effects.  He  completed prophylactic cranial irradiation. The patient is feeling fine today except some shortness breath with exertion and some episodes of nausea and vomiting related to cough an esophageal spasm. His recent CT scan of the chest showed further slight decrease in size of left lower lobe pulmonary nodule.  Stable right paratracheal lymph nodes.The patient was reviewed with Dr. Julien Nordmann. He will continue on observation and return in 3 months with another restaging CT scan of the chest with contrast to re-evaluate his disease.  He is to follow up with  his gastroenterologist as scheduled/needed regarding his nausea and vomiting.  He was advised to call immediately if she has any concerning symptoms in the interval.  All questions were answered. The patient knows to call the clinic with any problems, questions or concerns. We can certainly see the patient much sooner if necessary.  Disclaimer: This note was dictated with voice recognition software. Similar sounding words can inadvertently be transcribed and may not be corrected upon review.   Carlton Adam, PA-C 02/02/2014

## 2014-02-06 NOTE — Patient Instructions (Signed)
Your CT scan revealed further slight decrease in the size of the left lower lobe pulmonary nodule Follow up in 3 months with another restaging CT scan of your chest to re-evaluate your disease

## 2014-02-07 ENCOUNTER — Ambulatory Visit: Payer: Self-pay | Admitting: Hematology and Oncology

## 2014-03-15 ENCOUNTER — Other Ambulatory Visit: Payer: Self-pay | Admitting: Cardiology

## 2014-04-10 ENCOUNTER — Telehealth: Payer: Self-pay | Admitting: *Deleted

## 2014-04-10 NOTE — Telephone Encounter (Signed)
Pt called stating that he has a knot and pain under his right chest area.  It is tender to the touch and when he is lying on his stomach at night.  It is been there for the past 2 weeks.  No redness, swelling or drainage noted.  Per Dr Vista Mink, okay to have pt come in for lab/ CT and f/u sooner.  Advised that pt let the radiologist know that he has been having pain in the right chest area so they can note it on the scan.  He verbalized understanding.

## 2014-04-11 ENCOUNTER — Telehealth: Payer: Self-pay | Admitting: Internal Medicine

## 2014-04-11 ENCOUNTER — Other Ambulatory Visit: Payer: Self-pay | Admitting: Cardiology

## 2014-04-11 NOTE — Telephone Encounter (Signed)
per pof to move pt sch within [id number] w/Stephanie adv MM ist avail on 12/14-stated sch-cld 7 SPOKE TO TONI MOVESD ct CLD & SPOKE TO PT ADV OF R/S APPTS PT UNDERSTOOD

## 2014-04-13 ENCOUNTER — Other Ambulatory Visit: Payer: Self-pay | Admitting: Internal Medicine

## 2014-04-19 ENCOUNTER — Other Ambulatory Visit: Payer: Self-pay | Admitting: Gastroenterology

## 2014-04-20 ENCOUNTER — Encounter (HOSPITAL_COMMUNITY): Payer: Self-pay | Admitting: Cardiology

## 2014-04-21 ENCOUNTER — Other Ambulatory Visit (HOSPITAL_BASED_OUTPATIENT_CLINIC_OR_DEPARTMENT_OTHER): Payer: BC Managed Care – PPO

## 2014-04-21 ENCOUNTER — Ambulatory Visit (HOSPITAL_COMMUNITY)
Admission: RE | Admit: 2014-04-21 | Discharge: 2014-04-21 | Disposition: A | Discharge status: Home / Self Care | Payer: BC Managed Care – PPO | Source: Ambulatory Visit | Attending: Physician Assistant | Admitting: Physician Assistant

## 2014-04-21 DIAGNOSIS — C3490 Malignant neoplasm of unspecified part of unspecified bronchus or lung: Secondary | ICD-10-CM | POA: Insufficient documentation

## 2014-04-21 DIAGNOSIS — Z923 Personal history of irradiation: Secondary | ICD-10-CM | POA: Diagnosis not present

## 2014-04-21 DIAGNOSIS — R05 Cough: Secondary | ICD-10-CM | POA: Insufficient documentation

## 2014-04-21 DIAGNOSIS — Z9221 Personal history of antineoplastic chemotherapy: Secondary | ICD-10-CM | POA: Insufficient documentation

## 2014-04-21 DIAGNOSIS — Z85118 Personal history of other malignant neoplasm of bronchus and lung: Secondary | ICD-10-CM

## 2014-04-21 LAB — CBC WITH DIFFERENTIAL/PLATELET
BASO%: 0.2 % (ref 0.0–2.0)
Basophils Absolute: 0 10*3/uL (ref 0.0–0.1)
EOS%: 1.9 % (ref 0.0–7.0)
Eosinophils Absolute: 0.1 10*3/uL (ref 0.0–0.5)
HEMATOCRIT: 40.4 % (ref 38.4–49.9)
HGB: 13.7 g/dL (ref 13.0–17.1)
LYMPH%: 11.7 % — ABNORMAL LOW (ref 14.0–49.0)
MCH: 31.4 pg (ref 27.2–33.4)
MCHC: 33.9 g/dL (ref 32.0–36.0)
MCV: 92.4 fL (ref 79.3–98.0)
MONO#: 0.4 10*3/uL (ref 0.1–0.9)
MONO%: 6.8 % (ref 0.0–14.0)
NEUT#: 4.1 10*3/uL (ref 1.5–6.5)
NEUT%: 79.4 % — AB (ref 39.0–75.0)
PLATELETS: 123 10*3/uL — AB (ref 140–400)
RBC: 4.37 10*6/uL (ref 4.20–5.82)
RDW: 14.1 % (ref 11.0–14.6)
WBC: 5.1 10*3/uL (ref 4.0–10.3)
lymph#: 0.6 10*3/uL — ABNORMAL LOW (ref 0.9–3.3)

## 2014-04-21 LAB — COMPREHENSIVE METABOLIC PANEL (CC13)
ALT: 16 U/L (ref 0–55)
AST: 14 U/L (ref 5–34)
Albumin: 3.3 g/dL — ABNORMAL LOW (ref 3.5–5.0)
Alkaline Phosphatase: 127 U/L (ref 40–150)
Anion Gap: 9 mEq/L (ref 3–11)
BILIRUBIN TOTAL: 0.5 mg/dL (ref 0.20–1.20)
BUN: 14.7 mg/dL (ref 7.0–26.0)
CALCIUM: 9 mg/dL (ref 8.4–10.4)
CHLORIDE: 101 meq/L (ref 98–109)
CO2: 26 mEq/L (ref 22–29)
Creatinine: 1.8 mg/dL — ABNORMAL HIGH (ref 0.7–1.3)
EGFR: 40 mL/min/{1.73_m2} — ABNORMAL LOW (ref >90)
Glucose: 318 mg/dl — ABNORMAL HIGH (ref 70–140)
Potassium: 5.3 mEq/L — ABNORMAL HIGH (ref 3.5–5.1)
Sodium: 135 mEq/L — ABNORMAL LOW (ref 136–145)
Total Protein: 6.4 g/dL (ref 6.4–8.3)

## 2014-04-24 ENCOUNTER — Telehealth: Payer: Self-pay | Admitting: Internal Medicine

## 2014-04-24 ENCOUNTER — Encounter: Payer: Self-pay | Admitting: Internal Medicine

## 2014-04-24 ENCOUNTER — Ambulatory Visit (HOSPITAL_BASED_OUTPATIENT_CLINIC_OR_DEPARTMENT_OTHER): Payer: BC Managed Care – PPO | Admitting: Internal Medicine

## 2014-04-24 VITALS — BP 103/72 | HR 86 | Temp 98.0°F | Resp 19 | Ht 68.5 in | Wt 206.8 lb

## 2014-04-24 DIAGNOSIS — N289 Disorder of kidney and ureter, unspecified: Secondary | ICD-10-CM

## 2014-04-24 DIAGNOSIS — C3432 Malignant neoplasm of lower lobe, left bronchus or lung: Secondary | ICD-10-CM

## 2014-04-24 DIAGNOSIS — E875 Hyperkalemia: Secondary | ICD-10-CM

## 2014-04-24 DIAGNOSIS — C3492 Malignant neoplasm of unspecified part of left bronchus or lung: Secondary | ICD-10-CM

## 2014-04-24 DIAGNOSIS — E119 Type 2 diabetes mellitus without complications: Secondary | ICD-10-CM

## 2014-04-24 NOTE — Telephone Encounter (Signed)
Pt confirmed labs/ov per 12/14 POF, gave pt AVS..... KJ °

## 2014-04-24 NOTE — Progress Notes (Signed)
[place]  [phone] [phone] OFFICE VISIT PROGRESS NOTE  [name], MD [address] [place] [address]  DIAGNOSIS: Limited stage, Stage IIIA (T2a, N2, M0), Small cell carcinoma of lung, diagnosed in October of 2014, involving the left lower lobe and mediastinal lymphadenopathy.     PRIOR THERAPY:  1) Systemic chemotherapy with carboplatin for an AUC of 5 given on day 1 and etoposide at 120 mg per meter squared given on days 1, 2 and 3 with Neulasta support given on day 4 given every 3 weeks. Status post 4 cycles.  2) prophylactic cranial irradiation completed on 09/29/2013 under the care of Dr. Pablo Ledger.  CURRENT THERAPY: Observation.  DISEASE STAGE: Limited stage small cell lung cancer Small cell carcinoma of lung   Primary site: Lung (Left)   Staging method: AJCC 7th Edition   Clinical: Stage IIIA (T2a, N2, M0)   Summary: Stage IIIA (T2a, N2, M0) Malignant neoplasm of lower lobe of left lung   Primary site: Lung (Left)   Staging method: AJCC 7th Edition   Summary: Incomplete stage  CHEMOTHERAPY INTENT: Palliative  CURRENT # OF CHEMOTHERAPY CYCLES: 4  CURRENT ANTIEMETICS: Zofran, dexamethasone and Compazine  CURRENT SMOKING STATUS: Former smoker, quit 09/09/2009  ORAL CHEMOTHERAPY AND CONSENT: N./A  CURRENT BISPHOSPHONATES USE: None  PAIN MANAGEMENT: None  NARCOTICS INDUCED CONSTIPATION: None  LIVING WILL AND CODE STATUS: ?   INTERVAL HISTORY: [name] 58 y.o. male returns for followup visit. The patient is feeling fine today with no specific complaints. He notices some swelling in the right breast area but no palpable masses. He denied having any fever or chills. He denied having any nausea or vomiting. The patient has no chest pain, shortness of breath, cough or hemoptysis. He has repeat CT scan of the chest performed recently and he is here for evaluation and discussion of his scan  results.  MEDICAL HISTORY: Past Medical History  Diagnosis Date  . CAD (coronary artery disease)     a. PCI 3/08 to OM2 and mid CFX; b.  NSTEMI 6/10:  Xience DES to distal CFX;  c. abnl MV 12/13 => LHC 04/23/12:  Med Rx planned;  d.  NSTEMI - LHC (08/23/13):  Inf and inferolateral HK, EF 40-45%, prox LAD 40-50%, CFX occluded at prox stent edge, OM2 stent occluded, RCA occluded.  PCI:  DES x 2 to CFX  . Diabetes mellitus type II   . Hyperlipidemia   . COPD (chronic obstructive pulmonary disease)     Quit tobacco 09/2009; Gold Stage 2 with asthma component - FEV1 1.53 L/54%, 14% fev1 BD response, DLCO 54% - July 2011; MM genotype 01/07/10; unable to afford spiriva and unwilling to try ics due to prior renal failure: state to Dr. [name], Aug 2011; started on atrovent HFA fall 2011. no desturation on walk test Dec 2011  . CKD (chronic kidney disease)   . Obesity   . History of CVA (cerebrovascular accident) 2007    Without residual deficits  . 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 no further rx  . Hypertension   . Asthma     as child  . Small cell lung cancer  Stage IIIA - s/p chemotherapy and radiation  . Radiation 03/16/13-04/28/13    Left lung 60Gy  . Pneumonia     02/14/2013 in hospital  . History of chemotherapy finished dec 2014  . On home O2     constant/wears 2 liters prn  . Bilateral pulmonary embolism 08-03-2013    tx with Lovenox  . H/O non-ST elevation myocardial infarction (NSTEMI)     10/2008;  08/2013  . Radiation esophagitis     s/p esophageal dilation  . Radiation 09/13/13-09/29/13    Whole Brain Prophylactic 24 Gy in 12 Fx's    ALLERGIES:  is allergic to azithromycin;  erythromycin; fentanyl; and penicillins.  MEDICATIONS:  Current Outpatient Prescriptions  Medication Sig Dispense Refill  . albuterol (PROVENTIL HFA;VENTOLIN HFA) 108 (90 BASE) MCG/ACT inhaler Inhale into the lungs every 6 (six) hours as needed for wheezing or shortness of breath.    Marland Kitchen albuterol (PROVENTIL) (2.5 MG/3ML) 0.083% nebulizer solution Take 2.5 mg by nebulization every 6 (six) hours as needed for wheezing or shortness of breath.    Marland Kitchen aspirin EC 81 MG EC tablet Take 1 tablet (81 mg total) by mouth daily.    Marland Kitchen atorvastatin (LIPITOR) 40 MG tablet TAKE  ONE TABLET BY MOUTH NIGHTLY AT BEDTIME 30 tablet 0  . clopidogrel (PLAVIX) 75 MG tablet Take 1 tablet (75 mg total) by mouth daily with breakfast. 30 tablet 3  . emollient (BIAFINE) cream Apply 1 application topically 2 (two) times daily.    Marland Kitchen enoxaparin (LOVENOX) 150 MG/ML injection INJECT 0.93 MLS (140 MG TOTAL) INTO THE SKIN DAILY 30 mL 0  . isosorbide mononitrate (IMDUR) 60 MG 24 hr tablet Take 1 tablet (60 mg total) by mouth every morning. 30 tablet 6  . metoprolol (LOPRESSOR) 50 MG tablet TAKE ONE TABLET BY MOUTH TWICE DAILY 60 tablet 0  . ondansetron (ZOFRAN) 4 MG tablet TAKE 1 TABLET (4 MG TOTAL) BY MOUTH EVERY 8 (EIGHT) HOURS AS NEEDED FOR NAUSEA OR VOMITING. 30 tablet 0  . pantoprazole (PROTONIX) 40 MG tablet Take 40 mg by mouth 2 (two) times daily.    . ranolazine (RANEXA) 500 MG 12 hr tablet Take 1 tablet (500 mg total) by mouth 2 (two) times daily. 60 tablet 6   No current facility-administered medications for this visit.    SURGICAL HISTORY:  Past Surgical History  Procedure Laterality Date  . Wedge resection  03/28/2010  . Coronary stent placement  2009    x 2  . Cardiac catheterization  2009  . Endobronchial ultrasound Bilateral 02/28/2013    Procedure: ENDOBRONCHIAL ULTRASOUND;  Surgeon: Brand Males, MD;  Location: [facility] END[facility];  Service: Cardiopulmonary;  Laterality: Bilateral;  . Esophagogastroduodenoscopy  N/A 05/04/2013    Procedure: ESOPHAGOGASTRODUODENOSCOPY (EGD);  Surgeon: Inda Castle, MD;  Location: [facility];  Service: Endoscopy;  Laterality: N/A;  . Esophagogastroduodenoscopy N/A 07/06/2013    Procedure: ESOPHAGOGASTRODUODENOSCOPY (EGD);  Surgeon: Inda Castle, MD;  Location: [facility];  Service: Endoscopy;  Laterality: N/A;  . Savory dilation N/A 07/06/2013    Procedure: SAVORY DILATION;  Surgeon: Inda Castle, MD;  Location: [facility];  Service: Endoscopy;  Laterality: N/A;  . Esophagogastroduodenoscopy N/A 07/14/2013    Procedure: ESOPHAGOGASTRODUODENOSCOPY (EGD);  Surgeon: Inda Castle, MD;  Location: [facility];  Service: Endoscopy;  Laterality: N/A;  . Savory dilation N/A 07/14/2013    Procedure: SAVORY DILATION;  Surgeon: Inda Castle, MD;  Location: [facility];  Service: Endoscopy;  Laterality: N/A;  .  Esophagogastroduodenoscopy N/A 07/26/2013    Procedure: ESOPHAGOGASTRODUODENOSCOPY (EGD);  Surgeon: Inda Castle, MD;  Location: [facility];  Service: Endoscopy;  Laterality: N/A;  . Savory dilation N/A 07/26/2013    Procedure: SAVORY DILATION;  Surgeon: Inda Castle, MD;  Location: [facility];  Service: Endoscopy;  Laterality: N/A;  . Left heart catheterization with coronary angiogram N/A 08/23/2013    Procedure: LEFT HEART CATHETERIZATION WITH CORONARY ANGIOGRAM;  Surgeon: Leonie Man, MD;  Location: [facility] CATH LAB;  Service: Cardiovascular;  Laterality: N/A;    REVIEW OF SYSTEMS:  A comprehensive review of systems was negative.   PHYSICAL EXAMINATION: General appearance: alert, cooperative, appears stated age and no distress Head: Normocephalic, without obvious abnormality, atraumatic Neck: no adenopathy, no carotid bruit, no JVD, supple, symmetrical, trachea midline and thyroid not enlarged, symmetric, no tenderness/mass/nodules Lymph nodes: Cervical, supraclavicular, and axillary nodes normal. Resp: clear to auscultation bilaterally Cardio:  regular rate and rhythm, S1, S2 normal, no murmur, click, rub or gallop GI: soft, non-tender; bowel sounds normal; no masses,  no organomegaly Extremities: extremities normal, atraumatic, no cyanosis or edema Neurologic: Alert and oriented X 3, normal strength and tone. Normal symmetric reflexes. Normal coordination and gait  Breast exam showed no palpable masses.  ECOG PERFORMANCE STATUS: 1 - Symptomatic but completely ambulatory  Blood pressure 103/72, pulse 86, temperature 98 F (36.7 C), temperature source Oral, resp. rate 19, height 5' 8.5" (1.74 m), weight 206 lb 12.8 oz (93.804 kg), SpO2 92 %.  LABORATORY DATA: Lab Results  Component Value Date   WBC 5.1 04/21/2014   HGB 13.7 04/21/2014   HCT 40.4 04/21/2014   MCV 92.4 04/21/2014   PLT 123* 04/21/2014      Chemistry      Component Value Date/Time   NA 135* 04/21/2014 1357   NA 136* 01/24/2014 0742   K 5.3* 04/21/2014 1357   K 4.7 01/24/2014 0742   CL 98 01/24/2014 0742   CO2 26 04/21/2014 1357   CO2 26 01/24/2014 0742   BUN 14.7 04/21/2014 1357   BUN 22 01/24/2014 0742   CREATININE 1.8* 04/21/2014 1357   CREATININE 1.96* 01/24/2014 0742   CREATININE 1.46* 12/10/2010 0843      Component Value Date/Time   CALCIUM 9.0 04/21/2014 1357   CALCIUM 9.3 01/24/2014 0742   ALKPHOS 127 04/21/2014 1357   ALKPHOS 113 01/24/2014 0742   AST 14 04/21/2014 1357   AST 15 01/24/2014 0742   ALT 16 04/21/2014 1357   ALT 13 01/24/2014 0742   BILITOT 0.50 04/21/2014 1357   BILITOT 0.6 01/24/2014 0742       RADIOGRAPHIC STUDIES: Ct Chest Wo Contrast  04/21/2014   CLINICAL DATA:  Followup right lung small cell carcinoma. Previous surgery, radiation therapy, and chemotherapy. Cough.  EXAM: CT CHEST WITHOUT CONTRAST  TECHNIQUE: Multidetector CT imaging of the chest was performed following the standard protocol without IV contrast.  COMPARISON:  01/24/2014  FINDINGS: Mediastinum/Hilar Regions: Mild lymphadenopathy in the right  paratracheal region remain stable, largest lymph node measuring 10 mm on image 14 of series 2. No new or increased lymphadenopathy is seen.  Other Thoracic Lymphadenopathy:  None.  Lungs: Emphysema again noted. Postop changes in the right hemithorax are stable as well as post radiation changes which are most pronounced in the left paramediastinal lung zone. Previously noted decreasing sub-cm nodule in the left lower lobe is no longer visualized on this exam. No other suspicious pulmonary nodules or masses are identified.  Pleura:  No evidence of effusion or mass.  Vascular/Cardiac: Small pericardial effusion shows no significant change.  Other:  None.  Musculoskeletal:  No suspicious bone lesions identified.  IMPRESSION: Stable mild right paratracheal lymphadenopathy. Stable emphysema and post treatment changes in both lung fields. No new or progressive disease identified within the thorax.   Electronically Signed   By: Earle Gell M.D.   On: 04/21/2014 16:14   ASSESSMENT/PLAN: This is a very pleasant 58 years old white male with limited stage small cell lung cancer currently undergoing systemic chemotherapy concurrent with radiation, status post 4 cycles. The patient is tolerating his chemotherapy fairly well with no significant adverse effects. This was followed by prophylactic cranial irradiation. The patient has been observation since May 2015 with no evidence for disease recurrence. The recent CT scan of the chest showed no evidence for recurrence and no significant abnormalities in the right breast area. The patient is feeling fine today except for fatigue and shortness breath with exertion. I discussed the scan results with the patient. I recommended for him to continue on observation with repeat CT scan of the chest in 4 months. He was also advised to call me at the right breast area showed no improvement in the next few weeks. He may need to be considered for a mammogram no improvement. For the  history of diabetes mellitus he is currently followed by his primary care physician. For the renal insufficiency, it is currently stable. He has not seen a nephrologist yet but monitored closely by his primary care physician. He was advised to call immediately if she has any concerning symptoms in the interval.  All questions were answered. The patient knows to call the clinic with any problems, questions or concerns. We can certainly see the patient much sooner if necessary.  Disclaimer: This note was dictated with voice recognition software. Similar sounding words can inadvertently be transcribed and may not be corrected upon review.   Eilleen Kempf., MD 04/24/2014

## 2014-04-28 ENCOUNTER — Other Ambulatory Visit: Payer: Self-pay

## 2014-04-28 ENCOUNTER — Ambulatory Visit (HOSPITAL_COMMUNITY): Payer: BC Managed Care – PPO

## 2014-05-01 ENCOUNTER — Ambulatory Visit: Payer: Self-pay | Admitting: Internal Medicine

## 2014-05-03 ENCOUNTER — Other Ambulatory Visit: Payer: Self-pay | Admitting: Cardiology

## 2014-05-04 NOTE — Telephone Encounter (Signed)
Rx refill sent to patient pharmacy   

## 2014-05-09 ENCOUNTER — Other Ambulatory Visit: Payer: Self-pay | Admitting: Cardiology

## 2014-05-09 ENCOUNTER — Other Ambulatory Visit: Payer: Self-pay | Admitting: Internal Medicine

## 2014-05-11 ENCOUNTER — Other Ambulatory Visit: Payer: Self-pay | Admitting: Internal Medicine

## 2014-05-25 ENCOUNTER — Encounter (HOSPITAL_COMMUNITY): Payer: Self-pay | Admitting: Gastroenterology

## 2014-06-29 ENCOUNTER — Telehealth: Payer: Self-pay | Admitting: Internal Medicine

## 2014-06-29 MED ORDER — ALBUTEROL SULFATE (2.5 MG/3ML) 0.083% IN NEBU: 2.5000 mg | INHALATION_SOLUTION | Freq: Four times a day (QID) | RESPIRATORY_TRACT | Status: AC | PRN

## 2014-06-29 MED ORDER — ALBUTEROL SULFATE HFA 108 (90 BASE) MCG/ACT IN AERS: 2.0000 | INHALATION_SPRAY | Freq: Four times a day (QID) | RESPIRATORY_TRACT | Status: DC | PRN

## 2014-06-29 NOTE — Telephone Encounter (Signed)
First available with MW.   Dr. Melvyn Novas please advise if you are able to take this pt on. Thanks.

## 2014-06-29 NOTE — Telephone Encounter (Signed)
Called pt and appt scheduled to see MW nothing further needed

## 2014-06-29 NOTE — Telephone Encounter (Signed)
Called and spoke to pt. Pt is requesting to change MD's from MR to any other MD. Pt was vague on reason why he wanted to change. Pt also requested to have albuterol inhaler and neb solution filled. Both rx's filled. Pt has appt with TP on 07/10/2014 while awaiting MD change (last time pt seen was in 03/2013). Pt aware to keep appt with TP. Pt verbalized understanding and denied any further questions or concerns at this time.    MR please advise if ok for pt to switch to another physician.

## 2014-06-29 NOTE — Telephone Encounter (Signed)
Ok consult slot, all meds in hand

## 2014-06-29 NOTE — Telephone Encounter (Signed)
  Ok to change MD. If you can find out reason any more/better would be great but I respect his need to change

## 2014-06-30 ENCOUNTER — Other Ambulatory Visit: Payer: Self-pay | Admitting: Gastroenterology

## 2014-07-07 ENCOUNTER — Ambulatory Visit: Payer: 59 | Admitting: Adult Health

## 2014-07-10 ENCOUNTER — Telehealth: Payer: Self-pay | Admitting: Internal Medicine

## 2014-07-10 ENCOUNTER — Encounter: Payer: Self-pay | Admitting: Internal Medicine

## 2014-07-10 ENCOUNTER — Ambulatory Visit (INDEPENDENT_AMBULATORY_CARE_PROVIDER_SITE_OTHER): Payer: 59 | Admitting: Internal Medicine

## 2014-07-10 ENCOUNTER — Other Ambulatory Visit: Payer: Self-pay | Admitting: Cardiology

## 2014-07-10 ENCOUNTER — Ambulatory Visit (INDEPENDENT_AMBULATORY_CARE_PROVIDER_SITE_OTHER)
Admission: RE | Admit: 2014-07-10 | Discharge: 2014-07-10 | Disposition: A | Discharge status: Home / Self Care | Payer: 59 | Source: Ambulatory Visit | Attending: Internal Medicine | Admitting: Internal Medicine

## 2014-07-10 DIAGNOSIS — J449 Chronic obstructive pulmonary disease, unspecified: Secondary | ICD-10-CM

## 2014-07-10 MED ORDER — BUDESONIDE-FORMOTEROL FUMARATE 160-4.5 MCG/ACT IN AERO: INHALATION_SPRAY | RESPIRATORY_TRACT | Status: DC

## 2014-07-10 NOTE — Telephone Encounter (Signed)
MR please advise on below alternatives to symbicort for patient.  Thanks!

## 2014-07-10 NOTE — Patient Instructions (Addendum)
Plan A =  Automatic = symbicort 160 Take 2 puffs first thing in am and then another 2 puffs about 12 hours later.   Plan B= backup  Only use your albuterol (ventolin) as a rescue medication to be used if you can't catch your breath by resting or doing a relaxed purse lip breathing pattern.  - The less you use it, the better it will work when you need it. - Ok to use up to 2 puffs  every 4 hours if you must but call for immediate appointment if use goes up over your usual need - Don't leave home without it !!  (think of it like the spare tire for your car)   Plan C = crisis  Only use your nebulizer =albuterol 2.5 mg every 4 hours if you try B and it doesn't work  Please remember to go to the  x-ray department downstairs for your tests - we will call you with the results when they are available.  Please schedule a follow up office visit in 6 weeks, call sooner if needed with pfts

## 2014-07-10 NOTE — Telephone Encounter (Signed)
Patient said that there was a mix up with the insurance, he said that they will pay for Symbicort and he would rather just use the Symbicort.  Nothing further needed.

## 2014-07-10 NOTE — Telephone Encounter (Signed)
Ok forr [employer] low dose 1 puff daily  PAtient does not see me anymore. FYI. HE switched to DR Melvyn Novas so I am not sure how we would receive that I recommended BREO as choice. You might want to tell him that you d/w me in absence of MW

## 2014-07-10 NOTE — Progress Notes (Signed)
Subjective:    Patient ID: [name], male    DOB: 1955-09-26,    MRN: [id number]  HPI  56 yowm quit smoking 2009 followed by [name] previously and referred by Dr [name] Section 07/10/2014  With previously documented CIOD GOLD II with min reversibility 12/06/09    07/10/2014  Pulmonary consultation// Margaree Sandhu  GOLD II copd /min reversibility  Chief Complaint  Patient presents with  . Pulmonary Consult    Former patient of Dr. [name]- last seen Nov 2014. He is here for medication refills. Pt states that his breathing has been worse for the past 2 months. He is using ventolin about 2 x per day and also albuterol neb 2 x per day on average.    breathing was last "better"  Until around Christmas = could walk a mall but sill using saba at least twice daily even on best days  Can't lie on L side  s sob at baseline Doe gradually worse where now gives out x 50 ft flat Assoc with mild nasla congestion but no purulent secretions   No obvious other patterns in day to day or daytime variabilty or assoc chronic cough or cp or chest tightness, subjective wheeze or  overt  hb symptoms. No unusual exp hx or h/o childhood pna/ asthma or knowledge of premature birth.  Sleeping ok as long as stays off L side  without nocturnal  or early am exacerbation  of respiratory  c/o's or need for noct saba. Also denies any obvious fluctuation of symptoms with weather or environmental changes or other aggravating or alleviating factors except as outlined above   Current Medications, Allergies, Complete Past Medical History, Past Surgical History, Family History, and Social History were reviewed in [employer] record.             Review of Systems  Constitutional: Negative for fever, chills, activity change, appetite change and unexpected weight change.  HENT: Positive for congestion. Negative for dental problem, postnasal drip, rhinorrhea, sneezing, sore throat, trouble swallowing and voice  change.   Eyes: Negative for visual disturbance.  Respiratory: Positive for cough and shortness of breath. Negative for choking.   Cardiovascular: Negative for chest pain and leg swelling.  Gastrointestinal: Negative for nausea, vomiting and abdominal pain.  Genitourinary: Negative for difficulty urinating.  Musculoskeletal: Negative for arthralgias.  Skin: Negative for rash.  Psychiatric/Behavioral: Negative for behavioral problems and confusion.       Objective:   Physical Exam    Last used neb saba 3 h prior to OV    Wt Readings from Last 3 Encounters:  04/24/14 206 lb 12.8 oz (93.804 kg)  02/02/14 202 lb 11.2 oz (91.944 kg)  11/30/13 202 lb 4 oz (91.74 kg)    Vital signs reviewed    HEENT: nl dentition, turbinates, and orophanx. Nl external ear canals without cough reflex   NECK :  without JVD/Nodes/TM/ nl carotid upstrokes bilaterally   LUNGS: no acc muscle use, clear to A and P bilaterally without cough on insp or exp maneuvers   CV:  RRR  no s3 or murmur or increase in P2, no edema   ABD:  soft and nontender with nl excursion in the supine position. No bruits or organomegaly, bowel sounds nl  MS:  warm without deformities, calf tenderness, cyanosis or clubbing  SKIN: warm and dry without lesions    NEURO:  alert, approp, no deficits    CXR PA and Lateral:   07/10/2014 :  I personally reviewed images and agree with radiology impression as follows:    Posttreatment fibrosis, scarring and volume loss in the hemithoraces bilaterally without acute finding.     Chemistry      Component Value Date/Time   NA 135* 04/21/2014 1357   NA 136* 01/24/2014 0742   K 5.3* 04/21/2014 1357   K 4.7 01/24/2014 0742   CL 98 01/24/2014 0742   CO2 26 04/21/2014 1357   CO2 26 01/24/2014 0742   BUN 14.7 04/21/2014 1357   BUN 22 01/24/2014 0742   CREATININE 1.8* 04/21/2014 1357   CREATININE 1.96* 01/24/2014 0742   CREATININE 1.46* 12/10/2010 0843      Component Value  Date/Time   CALCIUM 9.0 04/21/2014 1357   CALCIUM 9.3 01/24/2014 0742   ALKPHOS 127 04/21/2014 1357   ALKPHOS 113 01/24/2014 0742   AST 14 04/21/2014 1357   AST 15 01/24/2014 0742   ALT 16 04/21/2014 1357   ALT 13 01/24/2014 0742   BILITOT 0.50 04/21/2014 1357   BILITOT 0.6 01/24/2014 0742       Lab Results  Component Value Date   WBC 5.1 04/21/2014   HGB 13.7 04/21/2014   HCT 40.4 04/21/2014   MCV 92.4 04/21/2014   PLT 123* 04/21/2014           Assessment & Plan:

## 2014-07-10 NOTE — Assessment & Plan Note (Addendum)
12/06/09 PFTs FEV1  1.75 (51%) p 14% improvement from saba and ratio 40 and dlco 54 corrects to 75% - 07/10/2014  Walked RA  2 laps @ 185 ft each stopped due to  Sob, no desat moderate pace  05/10/2015 p extensive coaching HFA effectiveness =    75% > try symbicort 160 2bid   DDX of  difficult airways management all start with A and  include Adherence, Ace Inhibitors, Acid Reflux, Active Sinus Disease, Alpha 1 Antitripsin deficiency, Anxiety masquerading as Airways dz,  ABPA,  allergy(esp in young), Aspiration (esp in elderly), Adverse effects of DPI,  Active smokers, plus two Bs  = Bronchiectasis and Beta blocker use..and one C= CHF  Adherence is always the initial "prime suspect" and is a multilayered concern that requires a "trust but verify" approach in every patient - starting with knowing how to use medications, especially inhalers, correctly, keeping up with refills and understanding the fundamental difference between maintenance and prns vs those medications only taken for a very short course and then stopped and not refilled.  The proper method of use, as well as anticipated side effects, of a metered-dose inhaler are discussed and demonstrated to the patient. Improved effectiveness after extensive coaching during this visit to a level of approximately  75% so try symbicort 160 2bid   ? Acid (or non-acid) GERD > always difficult to exclude as up to 75% of pts in some series report no assoc GI/ Heartburn symptoms> rec continue max (24h)  acid suppression and diet restrictions/ reviewed     ? BB effect > usually not an issue on low doses of lopressor > no change needed for now   If not better to his satisfaction and reducing his dependency on saba > add laba next

## 2014-07-11 NOTE — Progress Notes (Signed)
Quick Note:  Spoke with pt and notified of results per Dr. [name]. Pt verbalized understanding and denied any questions.  ______ 

## 2014-07-12 ENCOUNTER — Telehealth: Payer: Self-pay | Admitting: Internal Medicine

## 2014-07-12 NOTE — Telephone Encounter (Signed)
Unable to get through tonight to do the PA for the symbicort.  Will need to call back in the morning ---the correct number to call is [phone].  PA form is still in triage.

## 2014-07-13 NOTE — Telephone Encounter (Signed)
Spoke with [facility].   PA initiated.  Will forward to [place] to f/u on.

## 2014-07-14 NOTE — Telephone Encounter (Signed)
PA for symbicort has been denied. Pt must try and fail advari hfa/diskus Please advise MR thanks

## 2014-07-15 NOTE — Telephone Encounter (Signed)
Ok please give him advair 100/50, disc, 1 puff bid

## 2014-07-17 NOTE — Telephone Encounter (Signed)
Called [facility], Advair 100-50 rx called in.  Spoke with Gerald Stabs, verified that Rx is covered by insurance.  Pharmacy to work with pt to get lower co-pay (Rx is $60) ---- Pt aware. Nothing further needed.

## 2014-08-03 ENCOUNTER — Emergency Department (HOSPITAL_COMMUNITY): Payer: 59

## 2014-08-03 ENCOUNTER — Encounter: Payer: Self-pay | Admitting: Cardiology

## 2014-08-03 ENCOUNTER — Encounter (HOSPITAL_COMMUNITY): Payer: Self-pay | Admitting: *Deleted

## 2014-08-03 ENCOUNTER — Ambulatory Visit (INDEPENDENT_AMBULATORY_CARE_PROVIDER_SITE_OTHER): Payer: 59 | Admitting: Cardiology

## 2014-08-03 ENCOUNTER — Emergency Department (HOSPITAL_COMMUNITY)
Admission: EM | Admit: 2014-08-03 | Discharge: 2014-08-03 | Disposition: A | Discharge status: Home / Self Care | Payer: 59 | Attending: Emergency Medicine | Admitting: Emergency Medicine

## 2014-08-03 VITALS — BP 100/72 | HR 88 | Ht 70.0 in | Wt 205.0 lb

## 2014-08-03 DIAGNOSIS — Z9889 Other specified postprocedural states: Secondary | ICD-10-CM | POA: Diagnosis not present

## 2014-08-03 DIAGNOSIS — I2699 Other pulmonary embolism without acute cor pulmonale: Secondary | ICD-10-CM

## 2014-08-03 DIAGNOSIS — E119 Type 2 diabetes mellitus without complications: Secondary | ICD-10-CM | POA: Diagnosis not present

## 2014-08-03 DIAGNOSIS — Y998 Other external cause status: Secondary | ICD-10-CM | POA: Insufficient documentation

## 2014-08-03 DIAGNOSIS — E669 Obesity, unspecified: Secondary | ICD-10-CM | POA: Insufficient documentation

## 2014-08-03 DIAGNOSIS — I2510 Atherosclerotic heart disease of native coronary artery without angina pectoris: Secondary | ICD-10-CM

## 2014-08-03 DIAGNOSIS — K208 Other esophagitis without bleeding: Secondary | ICD-10-CM

## 2014-08-03 DIAGNOSIS — J449 Chronic obstructive pulmonary disease, unspecified: Secondary | ICD-10-CM | POA: Insufficient documentation

## 2014-08-03 DIAGNOSIS — Z9861 Coronary angioplasty status: Secondary | ICD-10-CM | POA: Diagnosis not present

## 2014-08-03 DIAGNOSIS — Y92410 Unspecified street and highway as the place of occurrence of the external cause: Secondary | ICD-10-CM | POA: Insufficient documentation

## 2014-08-03 DIAGNOSIS — S20212A Contusion of left front wall of thorax, initial encounter: Secondary | ICD-10-CM | POA: Insufficient documentation

## 2014-08-03 DIAGNOSIS — I714 Abdominal aortic aneurysm, without rupture, unspecified: Secondary | ICD-10-CM

## 2014-08-03 DIAGNOSIS — Y9389 Activity, other specified: Secondary | ICD-10-CM | POA: Diagnosis not present

## 2014-08-03 DIAGNOSIS — I129 Hypertensive chronic kidney disease with stage 1 through stage 4 chronic kidney disease, or unspecified chronic kidney disease: Secondary | ICD-10-CM | POA: Diagnosis not present

## 2014-08-03 DIAGNOSIS — C3490 Malignant neoplasm of unspecified part of unspecified bronchus or lung: Secondary | ICD-10-CM

## 2014-08-03 DIAGNOSIS — Z85118 Personal history of other malignant neoplasm of bronchus and lung: Secondary | ICD-10-CM | POA: Insufficient documentation

## 2014-08-03 DIAGNOSIS — T66XXXA Radiation sickness, unspecified, initial encounter: Secondary | ICD-10-CM

## 2014-08-03 DIAGNOSIS — S299XXA Unspecified injury of thorax, initial encounter: Secondary | ICD-10-CM | POA: Diagnosis present

## 2014-08-03 DIAGNOSIS — N189 Chronic kidney disease, unspecified: Secondary | ICD-10-CM | POA: Insufficient documentation

## 2014-08-03 HISTORY — DX: Respiratory tuberculosis unspecified: A15.9

## 2014-08-03 LAB — COMPREHENSIVE METABOLIC PANEL
ALT: 14 U/L (ref 0–53)
AST: 22 U/L (ref 0–37)
Albumin: 3.4 g/dL — ABNORMAL LOW (ref 3.5–5.2)
Alkaline Phosphatase: 104 U/L (ref 39–117)
Anion gap: 9 (ref 5–15)
BUN: 24 mg/dL — AB (ref 6–23)
CALCIUM: 8.9 mg/dL (ref 8.4–10.5)
CO2: 24 mmol/L (ref 19–32)
Chloride: 103 mmol/L (ref 96–112)
Creatinine, Ser: 1.89 mg/dL — ABNORMAL HIGH (ref 0.50–1.35)
GFR calc Af Amer: 43 mL/min — ABNORMAL LOW (ref >90)
GFR, EST NON AFRICAN AMERICAN: 38 mL/min — AB (ref >90)
GLUCOSE: 271 mg/dL — AB (ref 70–99)
Potassium: 5.1 mmol/L (ref 3.5–5.1)
Sodium: 136 mmol/L (ref 135–145)
TOTAL PROTEIN: 6.2 g/dL (ref 6.0–8.3)
Total Bilirubin: 0.9 mg/dL (ref 0.3–1.2)

## 2014-08-03 LAB — I-STAT CHEM 8, ED
BUN: 30 mg/dL — ABNORMAL HIGH (ref 6–23)
CALCIUM ION: 1.09 mmol/L — AB (ref 1.12–1.23)
Chloride: 103 mmol/L (ref 96–112)
Creatinine, Ser: 1.9 mg/dL — ABNORMAL HIGH (ref 0.50–1.35)
GLUCOSE: 253 mg/dL — AB (ref 70–99)
HEMATOCRIT: 39 % (ref 39.0–52.0)
HEMOGLOBIN: 13.3 g/dL (ref 13.0–17.0)
Potassium: 5 mmol/L (ref 3.5–5.1)
SODIUM: 137 mmol/L (ref 135–145)
TCO2: 19 mmol/L (ref 0–100)

## 2014-08-03 LAB — CBC
HCT: 38.9 % — ABNORMAL LOW (ref 39.0–52.0)
Hemoglobin: 13.1 g/dL (ref 13.0–17.0)
MCH: 31.7 pg (ref 26.0–34.0)
MCHC: 33.7 g/dL (ref 30.0–36.0)
MCV: 94.2 fL (ref 78.0–100.0)
PLATELETS: 153 10*3/uL (ref 150–400)
RBC: 4.13 MIL/uL — ABNORMAL LOW (ref 4.22–5.81)
RDW: 14.8 % (ref 11.5–15.5)
WBC: 6.8 10*3/uL (ref 4.0–10.5)

## 2014-08-03 LAB — I-STAT TROPONIN, ED: TROPONIN I, POC: 0.01 ng/mL (ref 0.00–0.08)

## 2014-08-03 LAB — ETHANOL

## 2014-08-03 LAB — PROTIME-INR
INR: 1.09 (ref 0.00–1.49)
Prothrombin Time: 14.3 seconds (ref 11.6–15.2)

## 2014-08-03 LAB — CDS SEROLOGY

## 2014-08-03 MED ORDER — ALBUTEROL SULFATE (2.5 MG/3ML) 0.083% IN NEBU
5.0000 mg | INHALATION_SOLUTION | Freq: Once | RESPIRATORY_TRACT | Status: AC
  Administered 2014-08-03: 5 mg via RESPIRATORY_TRACT
  Filled 2014-08-03: qty 6

## 2014-08-03 MED ORDER — MORPHINE SULFATE 4 MG/ML IJ SOLN
6.0000 mg | Freq: Once | INTRAMUSCULAR | Status: AC
Start: 2014-08-03 — End: 2014-08-03
  Administered 2014-08-03: 6 mg via INTRAVENOUS
  Filled 2014-08-03: qty 2

## 2014-08-03 MED ORDER — SODIUM CHLORIDE 0.9 % IV BOLUS (SEPSIS)
1000.0000 mL | INTRAVENOUS | Status: AC
Start: 2014-08-03 — End: 2014-08-03
  Administered 2014-08-03: 1000 mL via INTRAVENOUS

## 2014-08-03 MED ORDER — IOHEXOL 300 MG/ML  SOLN
80.0000 mL | Freq: Once | INTRAMUSCULAR | Status: AC | PRN
  Administered 2014-08-03: 80 mL via INTRAVENOUS

## 2014-08-03 MED ORDER — MORPHINE SULFATE 4 MG/ML IJ SOLN
6.0000 mg | Freq: Once | INTRAMUSCULAR | Status: AC
  Administered 2014-08-03: 6 mg via INTRAVENOUS
  Filled 2014-08-03: qty 2

## 2014-08-03 MED ORDER — OXYCODONE-ACETAMINOPHEN 5-325 MG PO TABS: 1.0000 | ORAL_TABLET | Freq: Four times a day (QID) | ORAL | Status: DC | PRN

## 2014-08-03 NOTE — Assessment & Plan Note (Signed)
Dr Julien Nordmann follows, s/p chemo and radiation
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
## 2014-08-03 NOTE — Patient Instructions (Signed)
Your physician wants you to follow-up in: 6 Months with Dr Kendall Flack will receive a reminder letter in the mail two months in advance. If you don't receive a letter, please call our office to schedule the follow-up appointment.

## 2014-08-03 NOTE — Assessment & Plan Note (Signed)
Followed by Dr [name] 

## 2014-08-03 NOTE — Progress Notes (Signed)
08/03/2014 Raynelle Chary   1956-01-20  [id number]  Primary Physician Lelon Huh, MD Primary Cardiologist: Dr Aundra Dubin  HPI:  59 y.o. 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 LHC in 04/2012 demonstrated mid CFX stent with 80% ISR but territory supplied by CFX beyond occluded PLOM was small. Med Rx was chosen at that time. He was admitted in 07/2013 with bilateral pulmonary emboli. Echo at that time demonstrated R heart strain. He was d/c on Lovenox and has been on this since. His oncologist is Dr Julien Nordmann.    He was admitted 4/11-4/16/2015  with a NSTEMI. He had runs of AFib with RVR that converted to NSR. He underwent LHC that demonstrated 100% ISR of CFX stent that was tx with DES x 2.The OM1 remained occluded and RCA was occluded (chronic).DAP therapy was recommended for minimum of 3 mos, then ASA would be stopped.  He is in the office today for follow up. I have never seen before and he hasn't seen Dr Aundra Dubin in > 2 yrs. He had questions about the duration of his Lovenox therapy and I suggested he talk to Dr Julien Nordmann about.that. He also told me Dr Deatra Ina is reluctant to do another esophageal dilatation on the pt as long as he is on Plavix. 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 From a cardiac standpoint he has done well, he denies chest pain or palpitations.    Current Outpatient Prescriptions  Medication Sig Dispense Refill  . ADVAIR DISKUS 100-50 MCG/DOSE AEPB Inhale 1 puff into the lungs 2 (two) times daily.    Marland Kitchen albuterol (PROVENTIL HFA;VENTOLIN HFA) 108 (90 BASE) MCG/ACT inhaler Inhale 2 puffs into the lungs every 6 (six) hours as needed for wheezing or shortness of breath. 1 Inhaler 5  . albuterol (PROVENTIL) (2.5 MG/3ML) 0.083% nebulizer solution  Take 3 mLs (2.5 mg total) by nebulization every 6 (six) hours as needed for wheezing or shortness of breath. 360 mL 1  . aspirin EC 81 MG EC tablet Take 1 tablet (81 mg total) by mouth daily.    Marland Kitchen atorvastatin (LIPITOR) 40 MG tablet TAKE ONE TABLET BY MOUTH AT BEDTIME 30 tablet 2  . clopidogrel (PLAVIX) 75 MG tablet TAKE ONE TABLET BY MOUTH DAILY WITH BREAKFAST 30 tablet 0  . enoxaparin (LOVENOX) 150 MG/ML injection INJECT 0.93 MLS (140 MG TOTAL) INTO THE SKIN DAILY 30 mL 2  . GuaiFENesin (MUCUS-ER PO) Take 1 tablet by mouth 2 (two) times daily as needed.    . isosorbide mononitrate (IMDUR) 60 MG 24 hr tablet TAKE ONE TABLET BY MOUTH EVERY MORNING 30 tablet 0  . metoprolol (LOPRESSOR) 50 MG tablet Take 1 tablet (50 mg total) by mouth 2 (two) times daily. *MUST MAKE APPOINMENT* 60 tablet 0  . ondansetron (ZOFRAN) 4 MG tablet TAKE ONE TABLET BY MOUTH EVERY FOUR HOURS AS NEEDED FOR NAUSEA AND VOMMITING 30 tablet 2  . pantoprazole (PROTONIX) 40 MG tablet Take 40 mg by mouth 2 (two) times daily.    . ranolazine (RANEXA) 500 MG 12 hr tablet Take 1 tablet (500 mg total) by mouth 2 (two) times daily. 60 tablet 6   No current facility-administered medications for this visit.    Allergies  Allergen Reactions  . Azithromycin  Reaction while in hospital - pt doesn't know reaction  . Erythromycin Other (See Comments)    Reaction while in hospital - pt doesn't know reaction  . Fentanyl Other (See Comments)    Delirium and violent  . Penicillins Other (See Comments)    Childhood reaction - unknown    History   Social History  . Marital Status: Single    Spouse Name: N/A  . Number of Children: 0  . Years of Education: N/A   Occupational History  . Corporate treasurer - sports wear designer [employer]    June 2011/Disability   Social History Main Topics  . Smoking status: Former Smoker -- 1.00 packs/day for 40 years    Types: Cigarettes    Quit date: 09/09/2008  . Smokeless  tobacco: Never Used  . Alcohol Use: No     Comment: rarely  . Drug Use: No  . Sexual Activity: No   Other Topics Concern  . Not on file   Social History Narrative   Single, lives with a friend upstairs.  Has home oxygen. Does not use cane or walker.  Drove until esophagus dilations, so not recently.                   Review of Systems: General: negative for chills, fever, night sweats or weight changes.  Cardiovascular: negative for chest pain, dyspnea on exertion, edema, orthopnea, palpitations, paroxysmal nocturnal dyspnea or shortness of breath Dermatological: negative for rash Respiratory: negative for cough or wheezing Urologic: negative for hematuria Abdominal: negative for nausea, vomiting, diarrhea, bright red blood per rectum, melena, or hematemesis Neurologic: negative for visual changes, syncope, or dizziness All other systems reviewed and are otherwise negative except as noted above.    Blood pressure 100/72, pulse 88, height 5\' 10"  (1.778 m), weight 205 lb (92.987 kg).  General appearance: alert, cooperative and no distress Lungs: clear to auscultation bilaterally Heart: regular rate and rhythm Extremities: no edema  EKG NSR without acute changes  ASSESSMENT AND PLAN:   Severe chronic obstructive pulmonary disease Followed by Dr Melvyn Novas   Radiation esophagitis With esophogeal stricture, followed by Dr Raquel Sarna pulmonary embolism March 2015- he has been on Lovenox since   Small cell carcinoma of lung Dr Julien Nordmann follows, s/p chemo and radiation   Diabetes mellitus, type 2 .    PLAN  Will review with Dr Deatra Robinson. I'm not sure he still needs ASA. In April it will be a year since his DES and will ask Dr Aundra Dubin if he could come off Plavix for an esophageal dilatation. Another option would be IV Integrelin for the procedure.  The pt will discuss Lovenox therapy for PE with Dr Julien Nordmann. He also has a history of PAF so I suspect he would need to  be on lifelong anticoagulation.   Mayra Jolliffe KPA-C 08/03/2014 1:35 PM

## 2014-08-03 NOTE — ED Notes (Signed)
Patient ambulated in hallway without assistance. Pt is not complaining of any pain and denies any lightheadedness or dizziness, and has no unsteady gait. Pt however is short of breath after exertion and when placed back on monitor, oxygen levels in the mid 80's on room air

## 2014-08-03 NOTE — Assessment & Plan Note (Signed)
March 2015- he has been on Lovenox since

## 2014-08-03 NOTE — Discharge Instructions (Signed)
Abdominal Aortic Aneurysm  Blood pumps away from the heart through tubes (blood vessels) called arteries. Aneurysms are weak or damaged places in the wall of an artery. It bulges out like a balloon. An abdominal aortic aneurysm happens in the main artery of the body (aorta). It can burst or tear, causing bleeding inside the body. This is an emergency. It needs treatment right away. CAUSES  The exact cause is unknown. Things that could cause this problem include:  Fat and other substances building up in the lining of a tube.  Swelling of the walls of a blood vessel.  Certain tissue diseases.  Belly (abdominal) trauma.  An infection in the main artery of the body. RISK FACTORS There are things that make it more likely for you to have an aneurysm. These include:  Being over the age of 59 years old.  Having high blood pressure (hypertension).  Being a male.  Being white.  Being very overweight (obese).  Having a family history of aneurysm.  Using tobacco products. PREVENTION To lessen your chance of getting this condition:  Stop smoking. Stop chewing tobacco.  Limit or avoid alcohol.  Keep your blood pressure, blood sugar, and cholesterol within normal limits.  Eat less salt.  Eat foods low in saturated fats and cholesterol. These are found in animal and whole dairy products.  Eat more fiber. Fiber is found in whole grains, vegetables, and fruits.  Keep a healthy weight.  Stay active and exercise often. SYMPTOMS Symptoms depend on the size of the aneurysm and how fast it grows. There may not be symptoms. If symptoms occur, they can include:  Pain (belly, side, lower back, or groin).  Feeling full after eating a small amount of food.  Feeling sick to your stomach (nauseous), throwing up (vomiting), or both.  Feeling a lump in your belly that feels like it is beating (pulsating).  Feeling like you will pass out (faint). TREATMENT   Medicine to control blood  pressure and pain.  Imaging tests to see if the aneurysm gets bigger.  Surgery. MAKE SURE YOU:   Understand these instructions.  Will watch your condition.  Will get help right away if you are not doing well or get worse. Document Released: 08/23/2012 Document Reviewed: 08/23/2012 [facility] Patient Information 2015 [place]. This information is not intended to replace advice given to you by your health care provider. Make sure you discuss any questions you have with your health care provider.

## 2014-08-03 NOTE — ED Notes (Signed)
MD Harrison at bedside. 

## 2014-08-03 NOTE — Assessment & Plan Note (Signed)
With esophogeal stricture, followed by Dr Deatra Ina

## 2014-08-03 NOTE — ED Notes (Signed)
Waiting for patient to ambulate so patient can be discharged.

## 2014-08-04 NOTE — Progress Notes (Signed)
He does not need ASA, Plavix, and Lovenox.  I think he can continue on Plavix + Lovenox until the end of April.  At that time, can stop Plavix and use ASA 81 daily instead.  Would ideally wait until the end of April to stop Plavix but if urgent, he has been on Plavix > 6 months so could hold now.  Have him overbooked to followup with me.

## 2014-08-08 ENCOUNTER — Ambulatory Visit: Payer: Self-pay | Admitting: Cardiology

## 2014-08-08 ENCOUNTER — Telehealth: Payer: Self-pay | Admitting: Cardiology

## 2014-08-08 NOTE — Telephone Encounter (Signed)
Called pt to let him know he can stop Plavix and go on ASA 81 mg at the end of April.  Kerin Ransom PA-C 08/08/2014 4:19 PM

## 2014-08-15 ENCOUNTER — Ambulatory Visit
Admit: 2014-08-15 | Disposition: A | Discharge status: Home / Self Care | Payer: Self-pay | Attending: Family Medicine | Admitting: Family Medicine

## 2014-08-16 ENCOUNTER — Other Ambulatory Visit: Payer: Self-pay | Admitting: Cardiology

## 2014-08-17 ENCOUNTER — Telehealth: Payer: Self-pay | Admitting: Internal Medicine
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
## 2014-08-21 ENCOUNTER — Ambulatory Visit: Payer: Self-pay | Admitting: Internal Medicine

## 2014-08-22 ENCOUNTER — Encounter (HOSPITAL_COMMUNITY): Payer: Self-pay

## 2014-08-22 ENCOUNTER — Other Ambulatory Visit (HOSPITAL_BASED_OUTPATIENT_CLINIC_OR_DEPARTMENT_OTHER): Payer: 59

## 2014-08-22 ENCOUNTER — Ambulatory Visit (HOSPITAL_COMMUNITY)
Admission: RE | Admit: 2014-08-22 | Discharge: 2014-08-22 | Disposition: A | Discharge status: Home / Self Care | Payer: 59 | Source: Ambulatory Visit | Attending: Internal Medicine | Admitting: Internal Medicine

## 2014-08-22 DIAGNOSIS — E875 Hyperkalemia: Secondary | ICD-10-CM | POA: Insufficient documentation

## 2014-08-22 DIAGNOSIS — C3432 Malignant neoplasm of lower lobe, left bronchus or lung: Secondary | ICD-10-CM

## 2014-08-22 DIAGNOSIS — C3492 Malignant neoplasm of unspecified part of left bronchus or lung: Secondary | ICD-10-CM

## 2014-08-22 DIAGNOSIS — C3490 Malignant neoplasm of unspecified part of unspecified bronchus or lung: Secondary | ICD-10-CM

## 2014-08-22 LAB — CBC WITH DIFFERENTIAL/PLATELET
BASO%: 0.8 % (ref 0.0–2.0)
Basophils Absolute: 0.1 10*3/uL (ref 0.0–0.1)
EOS ABS: 0.1 10*3/uL (ref 0.0–0.5)
EOS%: 1.4 % (ref 0.0–7.0)
HCT: 41.2 % (ref 38.4–49.9)
HGB: 14 g/dL (ref 13.0–17.1)
LYMPH%: 10.3 % — ABNORMAL LOW (ref 14.0–49.0)
MCH: 31.3 pg (ref 27.2–33.4)
MCHC: 33.9 g/dL (ref 32.0–36.0)
MCV: 92.4 fL (ref 79.3–98.0)
MONO#: 0.5 10*3/uL (ref 0.1–0.9)
MONO%: 7.3 % (ref 0.0–14.0)
NEUT#: 5.7 10*3/uL (ref 1.5–6.5)
NEUT%: 80.2 % — ABNORMAL HIGH (ref 39.0–75.0)
Platelets: 208 10*3/uL (ref 140–400)
RBC: 4.46 10*6/uL (ref 4.20–5.82)
RDW: 15.1 % — AB (ref 11.0–14.6)
WBC: 7.1 10*3/uL (ref 4.0–10.3)
lymph#: 0.7 10*3/uL — ABNORMAL LOW (ref 0.9–3.3)

## 2014-08-22 LAB — COMPREHENSIVE METABOLIC PANEL (CC13)
ALBUMIN: 3.6 g/dL (ref 3.5–5.0)
ALT: 11 U/L (ref 0–55)
AST: 13 U/L (ref 5–34)
Alkaline Phosphatase: 142 U/L (ref 40–150)
Anion Gap: 9 mEq/L (ref 3–11)
BUN: 20.6 mg/dL (ref 7.0–26.0)
CALCIUM: 9.1 mg/dL (ref 8.4–10.4)
CO2: 29 mEq/L (ref 22–29)
CREATININE: 1.7 mg/dL — AB (ref 0.7–1.3)
Chloride: 102 mEq/L (ref 98–109)
EGFR: 44 mL/min/{1.73_m2} — ABNORMAL LOW (ref >90)
Glucose: 161 mg/dl — ABNORMAL HIGH (ref 70–140)
POTASSIUM: 4.4 meq/L (ref 3.5–5.1)
Sodium: 139 mEq/L (ref 136–145)
Total Bilirubin: 0.78 mg/dL (ref 0.20–1.20)
Total Protein: 6.9 g/dL (ref 6.4–8.3)

## 2014-08-22 MED ORDER — IOHEXOL 300 MG/ML  SOLN
80.0000 mL | Freq: Once | INTRAMUSCULAR | Status: AC | PRN
  Administered 2014-08-22: 80 mL via INTRAVENOUS

## 2014-08-23 ENCOUNTER — Ambulatory Visit (HOSPITAL_BASED_OUTPATIENT_CLINIC_OR_DEPARTMENT_OTHER): Payer: 59 | Admitting: Internal Medicine

## 2014-08-23 ENCOUNTER — Encounter: Payer: Self-pay | Admitting: Internal Medicine

## 2014-08-23 ENCOUNTER — Telehealth: Payer: Self-pay | Admitting: Internal Medicine

## 2014-08-23 VITALS — BP 99/66 | HR 84 | Temp 97.7°F | Resp 18 | Ht 70.0 in | Wt 204.7 lb

## 2014-08-23 DIAGNOSIS — Z86711 Personal history of pulmonary embolism: Secondary | ICD-10-CM | POA: Diagnosis not present

## 2014-08-23 DIAGNOSIS — C3490 Malignant neoplasm of unspecified part of unspecified bronchus or lung: Secondary | ICD-10-CM

## 2014-08-23 DIAGNOSIS — Z85118 Personal history of other malignant neoplasm of bronchus and lung: Secondary | ICD-10-CM

## 2014-08-23 DIAGNOSIS — E119 Type 2 diabetes mellitus without complications: Secondary | ICD-10-CM

## 2014-08-23 DIAGNOSIS — J449 Chronic obstructive pulmonary disease, unspecified: Secondary | ICD-10-CM | POA: Diagnosis not present
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
## 2014-08-23 NOTE — Telephone Encounter (Signed)
gv and printed appt sched and avs for OCT.Marland KitchenMarland KitchenMarland Kitchen

## 2014-08-28 ENCOUNTER — Ambulatory Visit (INDEPENDENT_AMBULATORY_CARE_PROVIDER_SITE_OTHER): Payer: 59 | Admitting: Internal Medicine

## 2014-08-28 ENCOUNTER — Ambulatory Visit: Payer: Self-pay | Admitting: Internal Medicine

## 2014-08-28 ENCOUNTER — Ambulatory Visit (HOSPITAL_COMMUNITY)
Admission: RE | Admit: 2014-08-28 | Discharge: 2014-08-28 | Disposition: A | Discharge status: Home / Self Care | Payer: 59 | Source: Ambulatory Visit | Attending: Internal Medicine | Admitting: Internal Medicine

## 2014-08-28 ENCOUNTER — Encounter: Payer: Self-pay | Admitting: Internal Medicine

## 2014-08-28 VITALS — BP 112/76 | HR 80 | Ht 70.0 in | Wt 203.4 lb

## 2014-08-28 DIAGNOSIS — J449 Chronic obstructive pulmonary disease, unspecified: Secondary | ICD-10-CM

## 2014-08-28 LAB — PULMONARY FUNCTION TEST
DL/VA % pred: 35 %
DL/VA: 1.58 ml/min/mmHg/L
DLCO cor % pred: 26 %
DLCO cor: 7.96 ml/min/mmHg
DLCO unc % pred: 26 %
DLCO unc: 7.82 ml/min/mmHg
FEF 25-75 POST: 0.54 L/s
FEF 25-75 Pre: 0.32 L/sec
FEF2575-%CHANGE-POST: 68 %
FEF2575-%Pred-Post: 19 %
FEF2575-%Pred-Pre: 11 %
FEV1-%Change-Post: 24 %
FEV1-%Pred-Post: 37 %
FEV1-%Pred-Pre: 29 %
FEV1-POST: 1.26 L
FEV1-Pre: 1.01 L
FEV1FVC-%CHANGE-POST: -1 %
FEV1FVC-%Pred-Pre: 41 %
FEV6-%CHANGE-POST: 22 %
FEV6-%Pred-Post: 70 %
FEV6-%Pred-Pre: 57 %
FEV6-PRE: 2.47 L
FEV6-Post: 3.01 L
FEV6FVC-%Change-Post: -3 %
FEV6FVC-%Pred-Post: 77 %
FEV6FVC-%Pred-Pre: 79 %
FVC-%CHANGE-POST: 26 %
FVC-%PRED-POST: 91 %
FVC-%Pred-Pre: 72 %
FVC-Post: 4.07 L
FVC-Pre: 3.22 L
POST FEV1/FVC RATIO: 31 %
PRE FEV1/FVC RATIO: 31 %
Post FEV6/FVC ratio: 74 %
Pre FEV6/FVC Ratio: 77 %
RV % pred: 130 %
RV: 2.74 L
TLC % pred: 94 %
TLC: 6.23 L

## 2014-08-28 MED ORDER — ALBUTEROL SULFATE (2.5 MG/3ML) 0.083% IN NEBU
2.5000 mg | INHALATION_SOLUTION | Freq: Once | RESPIRATORY_TRACT | Status: AC
  Administered 2014-08-28: 2.5 mg via RESPIRATORY_TRACT

## 2014-08-28 MED ORDER — BUDESONIDE-FORMOTEROL FUMARATE 160-4.5 MCG/ACT IN AERO: INHALATION_SPRAY | RESPIRATORY_TRACT | Status: DC

## 2014-08-28 MED ORDER — ACLIDINIUM BROMIDE 400 MCG/ACT IN AEPB: 1.0000 | INHALATION_SPRAY | Freq: Two times a day (BID) | RESPIRATORY_TRACT | Status: DC

## 2014-08-28 NOTE — Progress Notes (Signed)
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 Pulmonary Consult    Former patient of Dr. [name]- last seen Nov 2014. He is here for medication refills. Pt states that his breathing has been worse for the past 2 months. He is using ventolin about 2 x per day and also albuterol neb 2 x per day on average.    breathing was last "better"  Until around Christmas = could walk a mall but sill using saba at least twice daily even on best days  Can't lie on L side  s sob at baseline Doe gradually worse where now gives out x 50 ft flat Assoc with mild nasal  congestion but no purulent secretions Trouble with am cough/ wheeze/ congestion  rec Plan A =  Automatic = symbicort 160 Take 2 puffs first thing in am and then another 2 puffs about 12 hours later.  Plan B= backup  Only use your albuterol (ventolin)  Plan C = crisis  Only use your nebulizer =albuterol 2.5 mg every 4 hours if you try B and it doesn't work Please remember to go to the  x-ray department downstairs for your tests - we will call you with the results when they are available. Please schedule a follow up office visit in 6 weeks, call sooner if needed with pfts       08/28/2014 f/u ov/[name] re: GOLD III copd on advair dpi  Bid but usually needs albterol each am to get his day started  Chief Complaint  Patient presents with  . Follow-up    PFT done today. Pt states that his breathing has improved slightly since the last visit. He is using albuterol inhaler 1 x daily on average and neb about 4 x per wk.    greatest concern is early am cough/ congestion/ wheezing on advair and freq saba   No obvious day to day or daytime variabilty or assoc chronic cough or cp or chest tightness, subjective wheeze overt sinus or hb symptoms. No unusual exp hx or h/o childhood pna/ asthma or knowledge of premature birth.  Sleeping ok without nocturnal  or early am exacerbation  of respiratory  c/o's or need for noct saba. Also denies any obvious fluctuation of symptoms with weather or environmental changes or other aggravating or alleviating factors except as outlined above   Current Medications, Allergies, Complete Past Medical History, Past Surgical History, Family History, and Social History were reviewed in [employer] record.  ROS  The following are not active complaints unless bolded sore throat, dysphagia, dental problems, itching, sneezing,  nasal congestion or excess/ purulent secretions, ear ache,   fever, chills, sweats, unintended wt loss, pleuritic or exertional cp, hemoptysis,  orthopnea pnd or leg swelling, presyncope, palpitations, heartburn, abdominal pain, anorexia, nausea, vomiting, diarrhea  or change in bowel or urinary habits, change in stools or urine, dysuria,hematuria,  rash, arthralgias, visual complaints, headache, numbness weakness or ataxia or problems with walking or coordination,  change in mood/affect or memory.  Objective:   Physical Exam     amb wm nad    08/28/2014       203  Wt Readings from Last 3 Encounters:  04/24/14 206 lb 12.8 oz (93.804 kg)  02/02/14 202 lb 11.2 oz (91.944 kg)  11/30/13 202 lb 4 oz (91.74 kg)    Vital signs reviewed    HEENT: nl dentition, turbinates, and orophanx. Nl external ear canals without cough reflex   NECK :  without JVD/Nodes/TM/ nl carotid upstrokes bilaterally   LUNGS: no acc muscle use, distant bs bilaterally without cough on insp or exp maneuvers   CV:  RRR  no s3 or murmur or increase  in P2, no edema   ABD:  soft and nontender with nl excursion in the supine position. No bruits or organomegaly, bowel sounds nl  MS:  warm without deformities, calf tenderness, cyanosis or clubbing  SKIN: warm and dry without lesions    NEURO:  alert, approp, no deficits    CXR PA and Lateral:   07/10/2014 :     I personally reviewed images and agree with radiology impression as follows:    Posttreatment fibrosis, scarring and volume loss in the hemithoraces bilaterally without acute finding.        Assessment & Plan:

## 2014-08-28 NOTE — Patient Instructions (Addendum)
Stop advair and start symbicort 160 x 2 / tudorza x 1 first thing in AM and repeat both  12 hours later  Call with formulary substitutions if needed   Please schedule a follow up visit in 3 months but call sooner if needed

## 2014-08-30 ENCOUNTER — Encounter: Payer: Self-pay | Admitting: Internal Medicine

## 2014-08-30 NOTE — Assessment & Plan Note (Signed)
-   quit smoking 2009 12/06/09 PFTs FEV1  1.75 (51%) p 14% improvement from saba and ratio 40 and dlco 54 corrects to 75% - 07/10/2014  Walked RA  2 laps @ 185 ft each stopped due to  Sob, no desat moderate pace  05/10/2015 p extensive coaching HFA effectiveness =    75% > try symbicort 160 2bid  - PFTs 08/28/14  FEV1  1.26 (37%) ratio 31 p 24% better p alb and DLCO 26%  I had an extended discussion with the patient reviewing all relevant studies completed to date and  lasting 15 to 20 minutes of a 25 minute visit on the following ongoing concerns:  1) due to surgery and RT he has progressed to a GOLD III and if he were more active he would be more symptomatic  2) his main difficulty is early in am and advair is the slowest of all the inhalers in terms of onset of action: better choice is symb/tudorza if insurance will cover  3) The proper method of use, as well as anticipated side effects, of a metered-dose inhaler and the dry powder inhlaer tudorza were discussed and demonstrated to the patient. Improved effectiveness after extensive coaching during this visit to a level of approximately  90%  4) Each maintenance medication was reviewed in detail including most importantly the difference between maintenance and as needed and under what circumstances the prns are to be used.  Please see instructions for details which were reviewed in writing and the patient given a copy.

## 2014-09-01 ENCOUNTER — Ambulatory Visit: Payer: Self-pay | Admitting: Internal Medicine

## 2014-09-24 ENCOUNTER — Other Ambulatory Visit: Payer: Self-pay | Admitting: Cardiology

## 2014-09-25 LAB — LIPID PANEL
CHOLESTEROL: 131 mg/dL (ref 0–200)
HDL: 32 mg/dL — AB (ref 35–70)
LDL CALC: 72 mg/dL
TRIGLYCERIDES: 137 mg/dL (ref 40–160)

## 2014-09-25 LAB — BASIC METABOLIC PANEL
BUN: 18 mg/dL (ref 4–21)
Creatinine: 1.6 mg/dL — AB (ref 0.6–1.3)
Glucose: 269 mg/dL
Potassium: 4.6 mmol/L (ref 3.4–5.3)
Sodium: 135 mmol/L — AB (ref 137–147)

## 2014-09-25 LAB — HEMOGLOBIN A1C: HEMOGLOBIN A1C: 8 % — AB (ref 4.0–6.0)

## 2014-10-23 ENCOUNTER — Other Ambulatory Visit: Payer: Self-pay | Admitting: Cardiology

## 2014-11-14 ENCOUNTER — Other Ambulatory Visit: Payer: Self-pay | Admitting: Cardiology

## 2014-11-24 ENCOUNTER — Telehealth: Payer: Self-pay | Admitting: *Deleted

## 2014-11-24 NOTE — Telephone Encounter (Signed)
TC from patient stating he is out of his lovenox injections. He states hebeleives Dr. Julien Nordmann said he could switch to an oral blood thinner after he finished his lovenox. He will need this called in to his pharmacy [facility] in [place])

## 2014-11-24 NOTE — Telephone Encounter (Signed)
Pt stated he has been on lovenox for a little over a year. He found several more lovenox injections . I told him to continue his lovenox and i will get back with pt on Monday after I talk to [facility] about switching to oral.

## 2014-11-27 ENCOUNTER — Encounter: Payer: Self-pay | Admitting: Internal Medicine

## 2014-11-27 ENCOUNTER — Other Ambulatory Visit: Payer: Self-pay | Admitting: Medical Oncology

## 2014-11-27 ENCOUNTER — Ambulatory Visit (INDEPENDENT_AMBULATORY_CARE_PROVIDER_SITE_OTHER): Payer: 59 | Admitting: Internal Medicine

## 2014-11-27 ENCOUNTER — Telehealth: Payer: Self-pay | Admitting: Medical Oncology

## 2014-11-27 VITALS — BP 88/62 | HR 82 | Ht 70.0 in | Wt 211.6 lb

## 2014-11-27 DIAGNOSIS — J449 Chronic obstructive pulmonary disease, unspecified: Secondary | ICD-10-CM | POA: Diagnosis not present

## 2014-11-27 DIAGNOSIS — E669 Obesity, unspecified: Secondary | ICD-10-CM | POA: Diagnosis not present

## 2014-11-27 DIAGNOSIS — R918 Other nonspecific abnormal finding of lung field: Secondary | ICD-10-CM

## 2014-11-27 DIAGNOSIS — C3490 Malignant neoplasm of unspecified part of unspecified bronchus or lung: Secondary | ICD-10-CM

## 2014-11-27 DIAGNOSIS — I2699 Other pulmonary embolism without acute cor pulmonale: Secondary | ICD-10-CM

## 2014-11-27 MED ORDER — RIVAROXABAN (XARELTO) VTE STARTER PACK (15 & 20 MG): ORAL_TABLET | ORAL | Status: DC

## 2014-11-27 NOTE — Progress Notes (Signed)
xarelto rx faxed to [facility]

## 2014-11-27 NOTE — Patient Instructions (Signed)
No change in medications   Work on perfecting  inhaler technique:  relax and gently blow all the way out then take a nice smooth deep breath back in, triggering the inhaler at same time you start breathing in.  Hold for up to 5 seconds if you can.  Rinse and gargle with water when done  Please schedule a follow up visit in 3 months but call sooner if needed with new medication formulary in hand - we can see you sooner or provide you samples between visits if the meds you are on now aren't on the new formulary

## 2014-11-27 NOTE — Telephone Encounter (Signed)
-----   Message from Curt Bears, MD sent at 11/25/2014 12:14 PM EDT ----- Regarding: RE: switch to xarelto? Yes. Ok to switch him to [employer]. ----- Message -----    From: Ardeen Garland, RN    Sent: 11/24/2014   3:31 PM      To: Curt Bears, MD Subject: switch to xarelto?                             Has 2-3 more lovenox syringes left.  . Do you want to switch him to starter pack next week?

## 2014-11-27 NOTE — Progress Notes (Signed)
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 Pulmonary Consult    Former patient of Dr. [name]- last seen Nov 2014. He is here for medication refills. Pt states that his breathing has been worse for the past 2 months. He is using ventolin about 2 x per day and also albuterol neb 2 x per day on average.    breathing was last "better"  Until around Christmas = could walk a mall but sill using saba at least twice daily even on best days  Can't lie on L side  s sob at baseline Doe gradually worse where now gives out x 50 ft flat Assoc with mild nasal  congestion but no purulent secretions Trouble with am cough/ wheeze/ congestion  rec Plan A =  Automatic = symbicort 160 Take 2 puffs first thing in am and then another 2 puffs about 12 hours later.  Plan B= backup  Only use your albuterol (ventolin)  Plan C = crisis  Only use your nebulizer =albuterol 2.5 mg every 4 hours if you try B and it doesn't work Please remember to go to the  x-ray department downstairs for your tests - we will call you with the results when they are available. Please schedule a follow up office visit in 6 weeks, call sooner if needed with pfts       08/28/2014 f/u ov/[name] re: GOLD III copd on advair dpi  Bid but usually needs albterol each am to get his day started  Chief Complaint  Patient presents with  . Follow-up    PFT done today. Pt states that his breathing has improved slightly since the last visit. He is using albuterol inhaler 1 x daily on average and neb  about 4 x per wk.    greatest concern is early am cough/ congestion/ wheezing on advair and freq saba  rec Stop advair and start symbicort 160 x 2 / tudorza x 1 first thing in AM and repeat both  12 hours later Call with formulary substitutions if needed    11/27/2014 f/u ov/[name] re: GOLD III on symb/turdorzamaint rx/ no need for saba   Chief Complaint  Patient presents with  . Follow-up    COPD: no concerns   now Am breathing is better/ problem is steps/ does ok flat surface / slower than others  MMRC  2  Which is an improvement  Not usuing rescue more than once a day at most   No obvious day to day or daytime variabilty or assoc chronic cough or cp or chest tightness, subjective wheeze overt sinus or hb symptoms. No unusual exp hx or h/o childhood pna/ asthma or knowledge of premature birth.  Sleeping ok without nocturnal  or early am exacerbation  of respiratory  c/o's or need for noct saba. Also denies any obvious fluctuation of symptoms with weather or environmental changes or other aggravating or alleviating factors except as outlined above   Current Medications, Allergies, Complete Past Medical  History, Past Surgical History, Family History, and Social History were reviewed in [employer] record.  ROS  The following are not active complaints unless bolded sore throat, dysphagia, dental problems, itching, sneezing,  nasal congestion or excess/ purulent secretions, ear ache,   fever, chills, sweats, unintended wt loss, pleuritic or exertional cp, hemoptysis,  orthopnea pnd or leg swelling, presyncope, palpitations, heartburn, abdominal pain, anorexia, nausea, vomiting, diarrhea  or change in bowel or urinary habits, change in stools or urine, dysuria,hematuria,  rash, arthralgias, visual complaints, headache, numbness weakness or ataxia or problems with walking or coordination,  change in mood/affect or memory.                    Objective:   Physical Exam      amb wm nad    08/28/2014       203 > 11/27/2014  212 Wt Readings from Last 3 Encounters:  04/24/14 206 lb 12.8 oz (93.804 kg)  02/02/14 202 lb 11.2 oz (91.944 kg)  11/30/13 202 lb 4 oz (91.74 kg)    Vital signs reviewed    HEENT: nl dentition, turbinates, and orophanx. Nl external ear canals without cough reflex   NECK :  without JVD/Nodes/TM/ nl carotid upstrokes bilaterally   LUNGS: no acc muscle use, distant bs bilaterally without cough on insp or exp maneuvers   CV:  RRR  no s3 or murmur or increase in P2, no edema   ABD:  soft and nontender with nl excursion in the supine position. No bruits or organomegaly, bowel sounds nl  MS:  warm without deformities, calf tenderness, cyanosis or clubbing  SKIN: warm and dry without lesions    NEURO:  alert, approp, no deficits    CXR PA and Lateral:   07/10/2014 :     I personally reviewed images and agree with radiology impression as follows:    Posttreatment fibrosis, scarring and volume loss in the hemithoraces bilaterally without acute finding.        Assessment & Plan:

## 2014-11-27 NOTE — Telephone Encounter (Signed)
Pt notified via voice message and to call back for any questions.

## 2014-12-04 NOTE — Assessment & Plan Note (Signed)
-   quit smoking 2009 12/06/09 PFTs FEV1  1.75 (51%) p 14% improvement from saba and ratio 40 and dlco 54 corrects to 75% - 07/10/2014  Walked RA  2 laps @ 185 ft each stopped due to  Sob, no desat moderate pace  05/10/2015 p extensive coaching HFA effectiveness =    75% > try symbicort 160 2bid  - PFTs 08/28/14  FEV1  1.26 (37%) ratio 31 p 24% better p alb and DLCO 26%  Adequate control on present rx, reviewed > no change in rx needed    The proper method of use, as well as anticipated side effects, of a metered-dose inhaler are discussed and demonstrated to the patient. Improved effectiveness after extensive coaching during this visit to a level of approximately  75% so still hasn't perfected it/ 90% with dpi  Each maintenance medication was reviewed in detail including most importantly the difference between maintenance and as needed and under what circumstances the prns are to be used.  Please see instructions for details which were reviewed in writing and the patient given a copy.

## 2014-12-04 NOTE — Assessment & Plan Note (Signed)
DIAGNOSIS: Limited stage, Stage IIIA (T2a, N2, M0), Small cell carcinoma of lung, diagnosed in October of 2014, involving the left lower lobe and mediastinal lymphadenopathy.    PRIOR THERAPY:  1) Systemic chemotherapy with carboplatin for an AUC of 5 given on day 1 and etoposide at 120 mg per meter squared given on days 1, 2 and 3 with Neulasta support given on day 4 given every 3 weeks. Status post 4 cycles.  2) prophylactic cranial irradiation completed on 09/29/2013 under the care of Dr. Pablo Ledger.  F/u per Dr Earlie Server

## 2014-12-04 NOTE — Assessment & Plan Note (Signed)
Body mass index is 30.36 kg/(m^2).  Lab Results  Component Value Date   TSH 0.971 04/30/2013     Contributing to gerd tendency/ doe/reviewed need  achieve and maintain neg calorie balance > defer f/u primary care including intermittently monitoring thyroid status

## 2014-12-11 ENCOUNTER — Other Ambulatory Visit: Payer: Self-pay | Admitting: Family Medicine

## 2014-12-11 NOTE — Telephone Encounter (Signed)
Last ov was on 09/25/2014

## 2014-12-14 ENCOUNTER — Other Ambulatory Visit: Payer: Self-pay | Admitting: Internal Medicine

## 2014-12-14 MED ORDER — ALBUTEROL SULFATE HFA 108 (90 BASE) MCG/ACT IN AERS: 2.0000 | INHALATION_SPRAY | Freq: Four times a day (QID) | RESPIRATORY_TRACT | Status: DC | PRN

## 2014-12-22 IMAGING — CT CT CHEST W/ CM
4 of 7 series · 16 of 36 positions shown, 18 images · IV contrast (omnipaque)
Comparison: CT chest dated 05/02/2013

CLINICAL DATA: Small cell lung cancer diagnosed [DATE],
chemotherapy and XRT complete, shortness of breath, dysphagia,
weight loss.

EXAM:
CT CHEST WITH CONTRAST
TECHNIQUE: Multidetector CT imaging of the chest was performed during
intravenous contrast administration.
CONTRAST:  100mL OMNIPAQUE IOHEXOL 300 MG/ML  SOLN

[Series 2: headseq 4.8 h45s · axial · 0.43mm/px · z∈[-552,-505]mm · 2 of 30 slices shown (1 of 2)]
[im 10/30  lung]
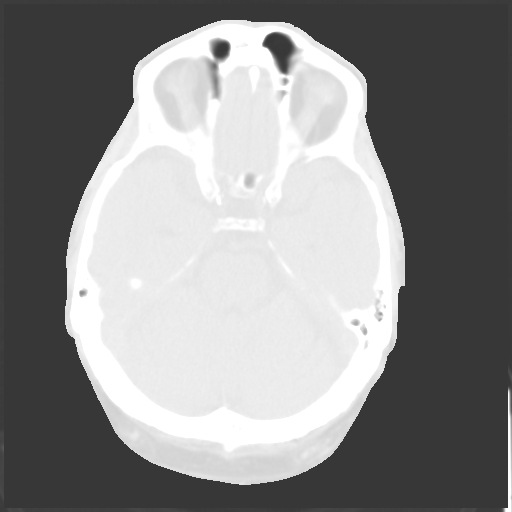
[im 20/30  lung]
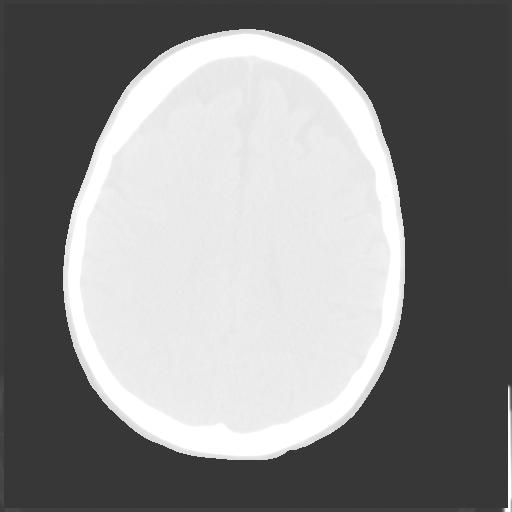

[Series 5: chest with st · axial · 0.76mm/px · z∈[-1014,-739]mm · 8 of 71 slices shown, 10 images]
[im 8/71  mediastinal]
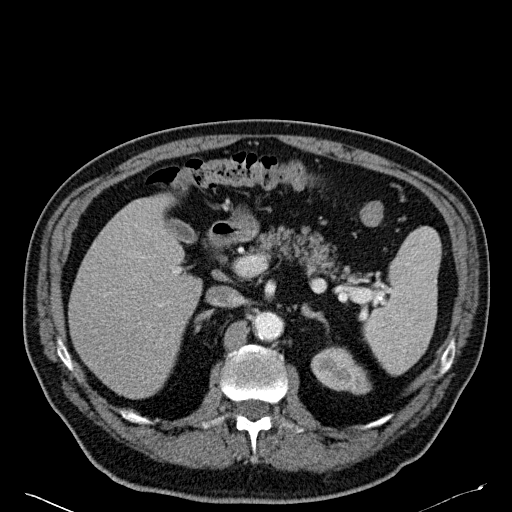
[im 8/71  lung]
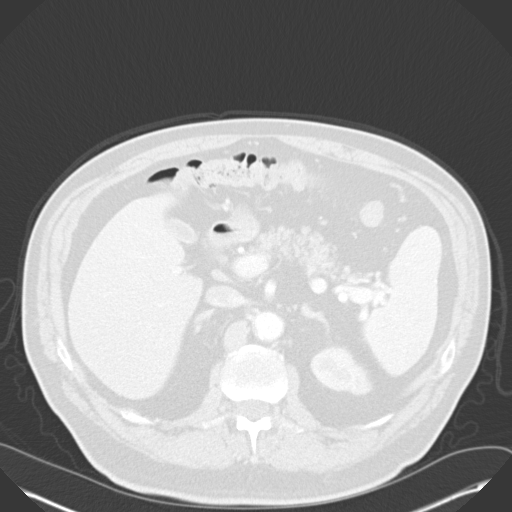
[im 16/71  lung]
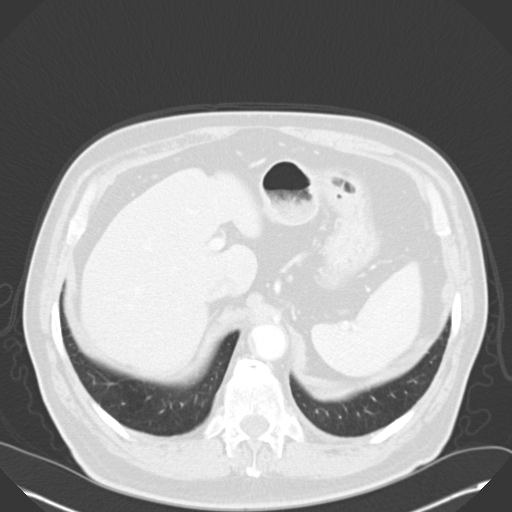
[im 24/71  lung]
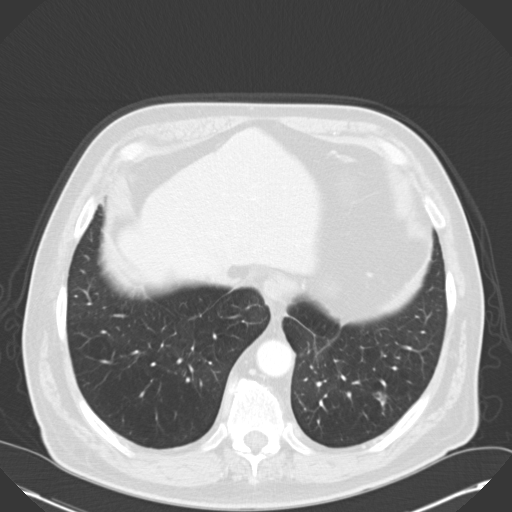
[im 32/71  lung]
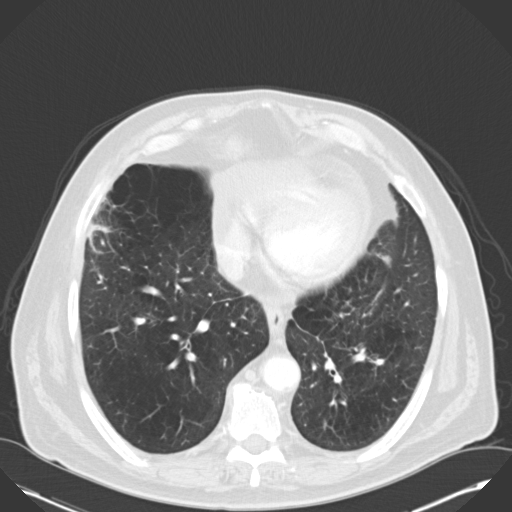
[im 39/71  mediastinal]
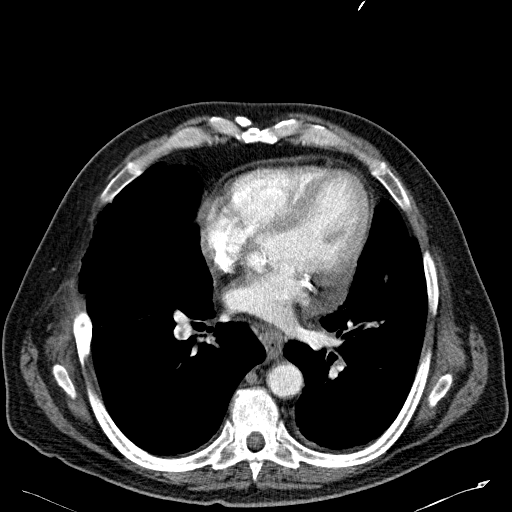
[im 39/71  lung]
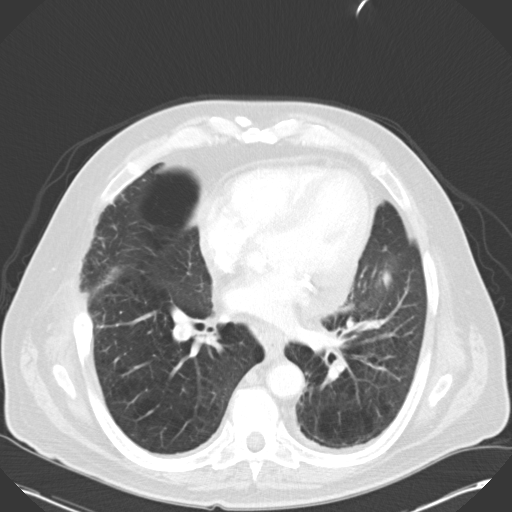
[im 47/71  lung]
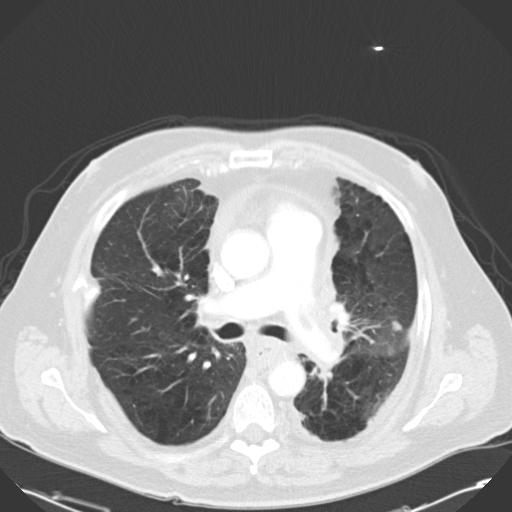
[im 55/71  lung]
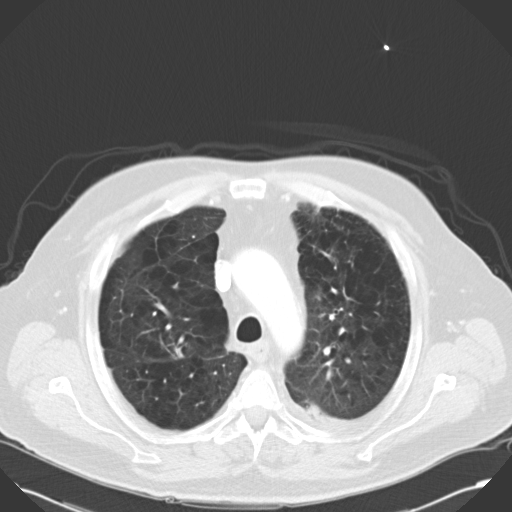
[im 63/71  lung]
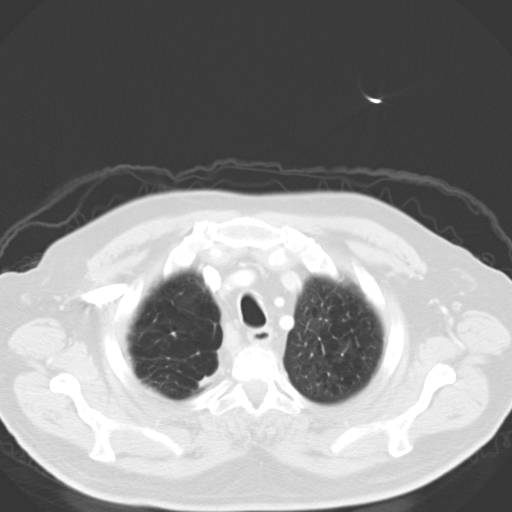

[Series 7: headseq 4.8 h45s · axial · 0.43mm/px · z∈[-557,-471]mm · 3 of 36 slices shown (2 of 2)]
[im 9/36  lung]
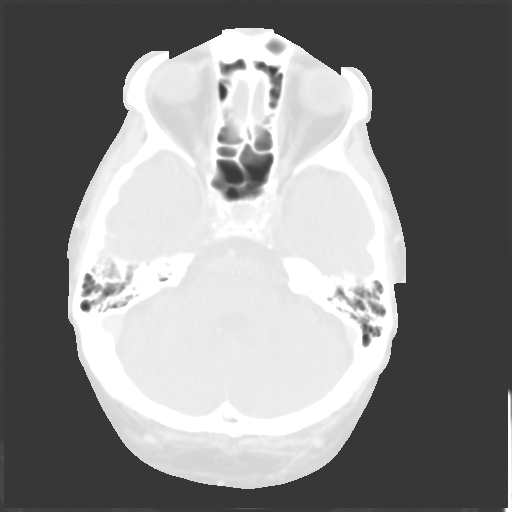
[im 18/36  lung]
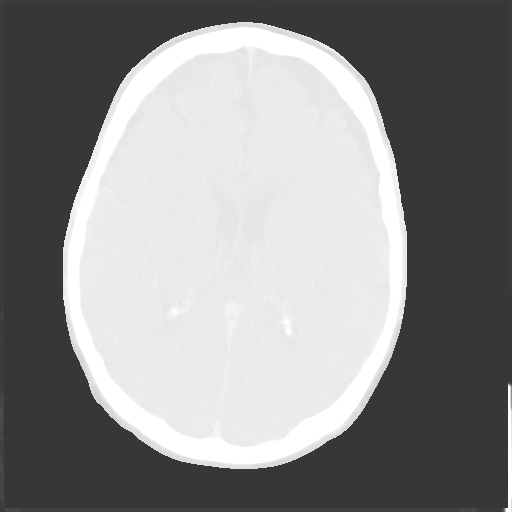
[im 27/36  lung]
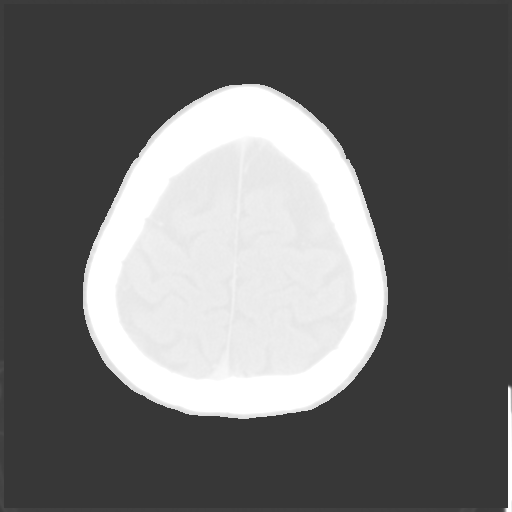

[Series 602: coronal images · coronal · 0.76mm/px · 3 of 103 slices shown]
[im 21/103  lung]
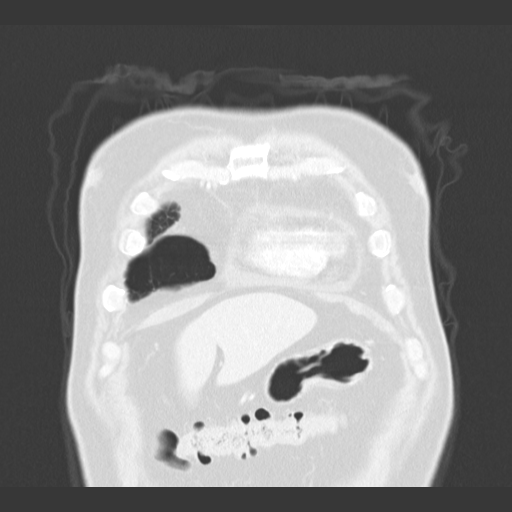
[im 41/103  lung]
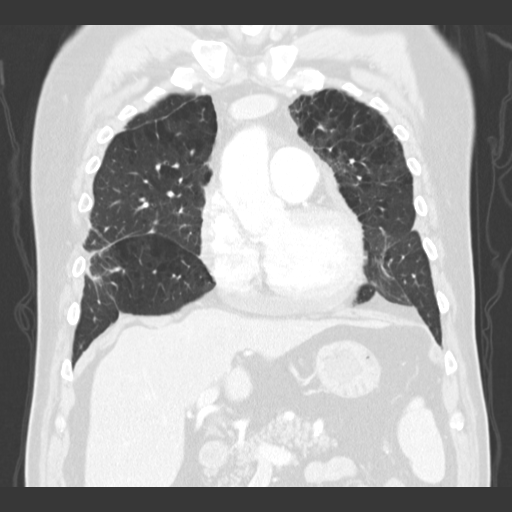
[im 62/103  lung]
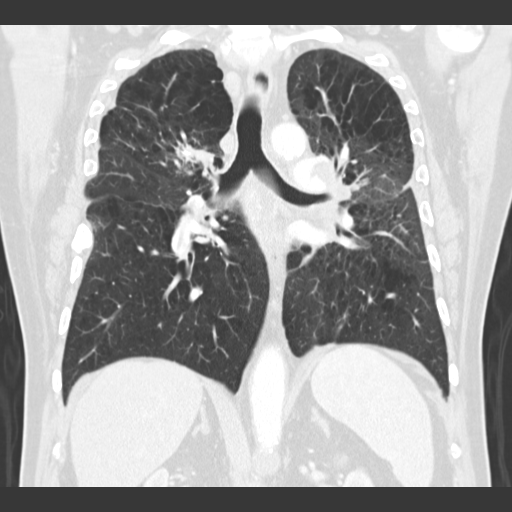

[16 of 36 positions shown; findings below may reference images not displayed]

FINDINGS: Although study was not tailored for optimal evaluation of the
pulmonary arteries, there are bilateral pulmonary emboli involving
the right middle lobe and bilateral lower lobe pulmonary arteries,
as well as the lingular branch. Overall clot burden is moderate. No
findings to suggest right heart strain, with an RV-to-LV ratio of
0.77.

Postsurgical changes related to prior right lung wedge resection.
Radiation changes in the superior segment left lower lobe.

New nodularity in the lingula measuring up to 1.7 x 1.5 cm (series
11/ image 21). Additional 11 mm irregular nodular opacity in the
left lower lobe (series 11/ image 49), new).

Moderate to severe centrilobular and paraseptal emphysematous
changes. Mild scarring in the anterolateral right lung base (series
11/ image 40), unchanged. No pleural effusion or pneumothorax.

Visualized thyroid is unremarkable.

Heart is normal in size. Small pericardial effusion, new/increased.
Atherosclerotic calcifications of the aortic arch. Coronary
atherosclerosis.

1.4 cm short axis right paratracheal node (series 5/ image 10),
previously 1.2 cm.

Wall thickening/edema involving the mid esophagus (series 5/image
27), unchanged.

Visualized upper abdomen is unremarkable.

Degenerative changes of the visualized thoracolumbar spine. Multiple
right rib defects related to prior right thoracotomy.
IMPRESSION: Bilateral pulmonary emboli, as described above. Overall clot burden
is moderate.

Prior right lung wedge resection with radiation changes in the
superior segment left lower lobe.

New nodularity in the lingula and left lower lobe, measuring up to
1.7 cm. While nonspecific, possibly post infectious/inflammatory
and/or related to radiation changes, metastatic disease is not
excluded. Attention on follow-up is suggested.

Mildly progressive right paratracheal lymphadenopathy measuring up
to 1.4 cm, worrisome for nodal recurrence.

Critical value/emergent results were called by telephone at the time
of interpretation on 08/03/2013 at [DATE] to Dr. NOSABABE VILLAGGIO ,
who verbally acknowledged these results. At his request, the patient
was notified of the results and sent to the ED.

## 2015-01-04 ENCOUNTER — Ambulatory Visit (INDEPENDENT_AMBULATORY_CARE_PROVIDER_SITE_OTHER)
Admission: RE | Admit: 2015-01-04 | Discharge: 2015-01-04 | Disposition: A | Discharge status: Home / Self Care | Payer: 59 | Source: Ambulatory Visit | Attending: Internal Medicine | Admitting: Internal Medicine

## 2015-01-04 ENCOUNTER — Encounter: Payer: Self-pay | Admitting: Internal Medicine

## 2015-01-04 ENCOUNTER — Ambulatory Visit (INDEPENDENT_AMBULATORY_CARE_PROVIDER_SITE_OTHER): Payer: 59 | Admitting: Internal Medicine

## 2015-01-04 VITALS — BP 110/70 | HR 98 | Ht 70.0 in | Wt 212.4 lb

## 2015-01-04 DIAGNOSIS — E669 Obesity, unspecified: Secondary | ICD-10-CM

## 2015-01-04 DIAGNOSIS — J449 Chronic obstructive pulmonary disease, unspecified: Secondary | ICD-10-CM

## 2015-01-04 DIAGNOSIS — K222 Esophageal obstruction: Secondary | ICD-10-CM | POA: Diagnosis not present

## 2015-01-04 MED ORDER — PHENYLEPH-PROMETHAZINE-COD 5-6.25-10 MG/5ML PO SYRP: ORAL_SOLUTION | ORAL | Status: DC

## 2015-01-04 NOTE — Progress Notes (Signed)
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 Pulmonary Consult    Former patient of Dr. [name]- last seen Nov 2014. He is here for medication refills. Pt states that his breathing has been worse for the past 2 months. He is using ventolin about 2 x per day and also albuterol neb 2 x per day on average.    breathing was last "better"  Until around Christmas = could walk a mall but sill using saba at least twice daily even on best days  Can't lie on L side  s sob at baseline Doe gradually worse where now gives out x 50 ft flat Assoc with mild nasal  congestion but no purulent secretions Trouble with am cough/ wheeze/ congestion  rec Plan A =  Automatic = symbicort 160 Take 2 puffs first thing in am and then another 2 puffs about 12 hours later.  Plan B= backup  Only use your albuterol (ventolin)  Plan C = crisis  Only use your nebulizer =albuterol 2.5 mg every 4 hours if you try B and it doesn't work Please remember to go to the  x-ray department downstairs for your tests - we will call you with the results when they are available. Please schedule a follow up office visit in 6 weeks, call sooner if needed with pfts       08/28/2014 f/u ov/Elgin Carn re: GOLD III copd on advair dpi  Bid but usually needs albterol each am to get his day started  Chief Complaint  Patient presents with  . Follow-up    PFT done today. Pt states that his breathing has improved slightly since the last visit. He is using albuterol inhaler 1 x daily on average and neb  about 4 x per wk.    greatest concern is early am cough/ congestion/ wheezing on advair and freq saba  rec Stop advair and start symbicort 160 x 2 / tudorza x 1 first thing in AM and repeat both  12 hours later Call with formulary substitutions if needed    11/27/2014 f/u ov/Kamya Watling re: GOLD III on symb/turdorza maint rx/ no need for saba   Chief Complaint  Patient presents with  . Follow-up    COPD: no concerns   now Am breathing is better/ problem is steps/ does ok flat surface / slower than others  MMRC  2  Which is an improvement  Not usuing rescue more than once a day at most  rec No change in medications  Work on perfecting  inhaler technique    01/04/2015 acute  ov/Claudine Stallings re:  Chief Complaint  Patient presents with  . Acute Visit    Pt c/o increased SOB x 2 wks.  He states that he gets SOB walking from room to room at home. He was out of breath walking from lobby to exam room today. He also c/o right side CP "feels like a knife" x 3 days.   last dose tudorza one week prior  to OV  - doe x min activity but / fine at rest/ fine sleeping  Maybe used saba twice daily since onset  Pain is R sided ant > post assoc with chronic dysphagia/cough to vomit  but can't have dilation due to plavix / not pleuritic  No obvious day to day or daytime variabilty or assoc chest tightness, subjective wheeze overt sinus or hb symptoms. No unusual exp hx or h/o childhood pna/ asthma or knowledge of premature birth.  Sleeping ok without nocturnal  or early am exacerbation  of respiratory  c/o's or need for noct saba. Also denies any obvious fluctuation of symptoms with weather or environmental changes or other aggravating or alleviating factors except as outlined above   Current Medications, Allergies, Complete Past Medical History, Past Surgical History, Family History, and Social History were reviewed in [employer] record.  ROS  The following are not active complaints unless  bolded sore throat, dysphagia, dental problems, itching, sneezing,  nasal congestion or excess/ purulent secretions, ear ache,   fever, chills, sweats, unintended wt loss,  exertional cp, hemoptysis,  orthopnea pnd or leg swelling, presyncope, palpitations, heartburn, abdominal pain, anorexia, nausea, vomiting, diarrhea  or change in bowel or urinary habits, change in stools or urine, dysuria,hematuria,  rash, arthralgias, visual complaints, headache, numbness weakness or ataxia or problems with walking or coordination,  change in mood/affect or memory.                    Objective:   Physical Exam     amb wm nad    08/28/2014       203 > 11/27/2014  212 >  01/04/2015 212   Wt Readings from Last 3 Encounters:  04/24/14 206 lb 12.8 oz (93.804 kg)  02/02/14 202 lb 11.2 oz (91.944 kg)  11/30/13 202 lb 4 oz (91.74 kg)    Vital signs reviewed    HEENT: nl dentition, turbinates, and orophanx. Nl external ear canals without cough reflex   NECK :  without JVD/Nodes/TM/ nl carotid upstrokes bilaterally   LUNGS: no acc muscle use, distant bs bilaterally without cough on insp or exp maneuvers   CV:  RRR  no s3 or murmur or increase in P2, no edema   ABD:  soft and nontender with nl excursion in the supine position. No bruits or organomegaly, bowel sounds nl  MS:  warm without deformities, calf tenderness, cyanosis or clubbing  SKIN: warm and dry without lesions    NEURO:  alert, approp, no deficits      CXR PA and Lateral:   01/04/2015 :     I personally reviewed images and agree with radiology impression as follows:    There is no evidence of progressive disease nor other acute cardiopulmonary abnormality. If the patient's symptoms remain unexplained, further evaluation with chest CT scanning would be useful.        Assessment & Plan:

## 2015-01-04 NOTE — Assessment & Plan Note (Addendum)
-   quit smoking 2009 - 12/06/09 PFTs FEV1  1.75 (51%) p 14% improvement from saba and ratio 40 and dlco 54 corrects to 75% - 07/10/2014  Walked RA  2 laps @ 185 ft each stopped due to  Sob, no desat moderate pace  05/10/2015 p extensive coaching HFA effectiveness =    75% > try symbicort 160 2bid  - PFTs 08/28/14  FEV1  1.26 (37%) ratio 31 p 24% better p alb and DLCO 26%     DDX of  difficult airways management all start with A and  include Adherence, Ace Inhibitors, Acid Reflux, Active Sinus Disease, Alpha 1 Antitripsin deficiency, Anxiety masquerading as Airways dz,  ABPA,  allergy(esp in young), Aspiration (esp in elderly), Adverse effects of meds,  Active smokers, A bunch of PE's (a small clot burden can't cause this syndrome unless there is already severe underlying pulm or vascular dz with poor reserve) plus two Bs  = Bronchiectasis and Beta blocker use..and one C= CHF  Adherence is always the initial "prime suspect" and is a multilayered concern that requires a "trust but verify" approach in every patient - starting with knowing how to use medications, especially inhalers, correctly, keeping up with refills and understanding the fundamental difference between maintenance and prns vs those medications only taken for a very short course and then stopped and not refilled.  - The proper method of use, as well as anticipated side effects, of a metered-dose inhaler are discussed and demonstrated to the patient. Improved effectiveness after extensive coaching during this visit to a level of approximately  90%  - confused with details of care// not keeping up with refills> given 2 week sample of tudorza then return with all meds in hand using a trust but verify approach  ? Acid (or non-acid) GERD > always difficult to exclude as up to 75% of pts in some series report no assoc GI/ Heartburn symptoms> rec max (24h)  acid suppression and diet restrictions/ reviewed and instructions given in writing.   I had  an extended discussion with the patient reviewing all relevant studies completed to date and  lasting 15 to 20 minutes of a 25 minute visit    Each maintenance medication was reviewed in detail including most importantly the difference between maintenance and prns and under what circumstances the prns are to be triggered using an action plan format that is not reflected in the computer generated alphabetically organized AVS.    Please see instructions for details which were reviewed in writing and the patient given a copy highlighting the part that I personally wrote and discussed at today's ov.

## 2015-01-04 NOTE — Patient Instructions (Addendum)
Plan A = Automatic = symbicort 160 / tudorza twice daily   Plan B= Backup - Only use your albuterol as a rescue medication to be used if you can't catch your breath by resting or doing a relaxed purse lip breathing pattern.  - The less you use it, the better it will work when you need it. - Ok to use up to 2 puffs  every 4 hours if you must but call for immediate appointment if use goes up over your usual need - Don't leave home without it !!  (think of it like the spare tire for your car)   Plan C = Crisis  - only use neb if you try inhaler first and it doesn't work but ok to use up to every 4 hours  Pantoprazole 40 mg Take 30- 60 min before your first and last meals of the day   GERD (REFLUX)  is an extremely common cause of respiratory symptoms just like yours , many times with no obvious heartburn at all.    It can be treated with medication, but also with lifestyle changes including elevation of the head of your bed (ideally with 6 inch  bed blocks),  Smoking cessation, avoidance of late meals, excessive alcohol, and avoid fatty foods, chocolate, peppermint, colas, red wine, and acidic juices such as orange juice.  NO MINT OR MENTHOL PRODUCTS SO NO COUGH DROPS  USE SUGARLESS CANDY INSTEAD (Jolley ranchers or Stover's or Life Savers) or even ice chips will also do - the key is to swallow to prevent all throat clearing. NO OIL BASED VITAMINS - use powdered substitutes.  For cough > use phergan with codeine 2 tsp every 4 hours  Please remember to go to the x-ray department downstairs for your tests - we will call you with the results when they are available  Please schedule a follow up office visit in 2 weeks, sooner if needed and bring all active meds with you

## 2015-01-05 NOTE — Progress Notes (Signed)
Quick Note:  Spoke with pt and notified of results per Dr. [name]. Pt verbalized understanding and denied any questions.  ______ 

## 2015-01-07 ENCOUNTER — Encounter: Payer: Self-pay | Admitting: Internal Medicine

## 2015-01-07 NOTE — Assessment & Plan Note (Signed)
Body mass index is 30.48  Trending up   Lab Results  Component Value Date   TSH 0.971 04/30/2013     Contributing to gerd tendency/ doe/reviewed need  achieve and maintain neg calorie balance > defer f/u primary care including intermittently monitoring thyroid status

## 2015-01-07 NOTE — Assessment & Plan Note (Addendum)
XRT-induced stricture, status post balloon dilation  Recurrent severe dysphagia assoc with severe cough to point of mscp  > rx max gerd rx/ cough suppression > f/u GI planned

## 2015-01-09 ENCOUNTER — Other Ambulatory Visit: Payer: Self-pay | Admitting: Internal Medicine

## 2015-01-09 DIAGNOSIS — I2699 Other pulmonary embolism without acute cor pulmonale: Secondary | ICD-10-CM

## 2015-01-09 DIAGNOSIS — C3490 Malignant neoplasm of unspecified part of unspecified bronchus or lung: Secondary | ICD-10-CM

## 2015-01-09 MED ORDER — RIVAROXABAN 20 MG PO TABS: 20.0000 mg | ORAL_TABLET | Freq: Every day | ORAL | Status: DC

## 2015-01-18 ENCOUNTER — Ambulatory Visit (INDEPENDENT_AMBULATORY_CARE_PROVIDER_SITE_OTHER): Payer: PPO | Admitting: Internal Medicine

## 2015-01-18 ENCOUNTER — Encounter: Payer: Self-pay | Admitting: Internal Medicine

## 2015-01-18 VITALS — BP 124/70 | HR 85 | Ht 70.0 in | Wt 212.0 lb

## 2015-01-18 DIAGNOSIS — E669 Obesity, unspecified: Secondary | ICD-10-CM

## 2015-01-18 DIAGNOSIS — J449 Chronic obstructive pulmonary disease, unspecified: Secondary | ICD-10-CM

## 2015-01-18 MED ORDER — UMECLIDINIUM BROMIDE 62.5 MCG/INH IN AEPB: 1.0000 | INHALATION_SPRAY | Freq: Every morning | RESPIRATORY_TRACT | Status: DC

## 2015-01-18 NOTE — Patient Instructions (Signed)
Incruse one click each am but take 2 deep drags  Please schedule a follow up visit in 3 months but call sooner if needed - bring forumlary list if not happy with your medications

## 2015-01-18 NOTE — Progress Notes (Signed)
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 Pulmonary Consult    Former patient of Dr. [name]- last seen Nov 2014. He is here for medication refills. Pt states that his breathing has been worse for the past 2 months. He is using ventolin about 2 x per day and also albuterol neb 2 x per day on average.    breathing was last "better"  Until around Christmas = could walk a mall but sill using saba at least twice daily even on best days  Can't lie on L side  s sob at baseline Doe gradually worse where now gives out x 50 ft flat Assoc with mild nasal  congestion but no purulent secretions Trouble with am cough/ wheeze/ congestion  rec Plan A =  Automatic = symbicort 160 Take 2 puffs first thing in am and then another 2 puffs about 12 hours later.  Plan B= backup  Only use your albuterol (ventolin)  Plan C = crisis  Only use your nebulizer =albuterol 2.5 mg every 4 hours if you try B and it doesn't work Please remember to go to the  x-ray department downstairs for your tests - we will call you with the results when they are available. Please schedule a follow up office visit in 6 weeks, call sooner if needed with pfts       08/28/2014 f/u ov/[name] re: GOLD III copd on advair dpi  Bid but usually needs albterol each am to get his day started  Chief Complaint  Patient presents with  . Follow-up    PFT done today. Pt states that his breathing has improved slightly since the last visit. He is using albuterol inhaler 1 x daily on average and neb  about 4 x per wk.    greatest concern is early am cough/ congestion/ wheezing on advair and freq saba  rec Stop advair and start symbicort 160 x 2 / tudorza x 1 first thing in AM and repeat both  12 hours later Call with formulary substitutions if needed    11/27/2014 f/u ov/[name] re: GOLD III on symb/turdorza maint rx/ no need for saba   Chief Complaint  Patient presents with  . Follow-up    COPD: no concerns   now Am breathing is better/ problem is steps/ does ok flat surface / slower than others  MMRC  2  Which is an improvement  Not usuing rescue more than once a day at most  rec No change in medications  Work on perfecting  inhaler technique    01/04/2015 acute  ov/[name] re:  Chief Complaint  Patient presents with  . Acute Visit    Pt c/o increased SOB x 2 wks.  He states that he gets SOB walking from room to room at home. He was out of breath walking from lobby to exam room today. He also c/o right side CP "feels like a knife" x 3 days.   last dose tudorza one week prior  to OV  - doe x min activity but / fine at rest/ fine sleeping  Maybe used saba twice daily since onset  Pain is R sided ant > post assoc with chronic dysphagia/cough to vomit  but can't have dilation due to plavix / not pleuritic rec Plan A = Automatic = symbicort 160 / tudorza twice daily  Plan B= Backup - Only use your albuterol as a rescue medication  Plan C = Crisis  - only use neb if you try inhaler first and it doesn't work but ok to use up to every 4 hours Pantoprazole 40 mg Take 30- 60 min before your first and last meals of the day  GERD diet   For cough > use phergan with codeine 2 tsp every 4 hours office visit in 2 weeks, sooner if needed and bring all active meds with you    01/18/2015 f/u ov/[name] re: GOLD III/ symb and tudorza maint rx/ no need for saba  Chief Complaint  Patient presents with  . Follow-up    Pt states that his breathing has improved back to baseline. He is not coughing as much  and occ produces some clear sputum. CP has resolved.     No longer needing  Cough suppression MMRC1 = can walk nl pace, flat grade, can't hurry or go uphills or steps s sob     No obvious day to day or daytime variabilty or assoc chest tightness, subjective wheeze overt sinus or hb symptoms. No unusual exp hx or h/o childhood pna/ asthma or knowledge of premature birth.  Sleeping ok without nocturnal  or early am exacerbation  of respiratory  c/o's or need for noct saba. Also denies any obvious fluctuation of symptoms with weather or environmental changes or other aggravating or alleviating factors except as outlined above   Current Medications, Allergies, Complete Past Medical History, Past Surgical History, Family History, and Social History were reviewed in [employer] record.  ROS  The following are not active complaints unless bolded sore throat, dysphagia, dental problems, itching, sneezing,  nasal congestion or excess/ purulent secretions, ear ache,   fever, chills, sweats, unintended wt loss,  exertional cp, hemoptysis,  orthopnea pnd or leg swelling, presyncope, palpitations, heartburn, abdominal pain, anorexia, nausea, vomiting, diarrhea  or change in bowel or urinary habits, change in stools or urine, dysuria,hematuria,  rash, arthralgias, visual complaints, headache, numbness weakness or ataxia or problems with walking or coordination,  change in mood/affect or memory.                    Objective:   Physical Exam     amb wm nad     08/28/2014       203 > 11/27/2014  212 >  01/04/2015 212  > 01/18/2015 212  Wt Readings from Last 3 Encounters:  04/24/14 206 lb 12.8 oz (93.804 kg)  02/02/14 202 lb 11.2 oz (91.944 kg)  11/30/13 202 lb 4 oz (91.74 kg)    Vital signs reviewed    HEENT: nl dentition, turbinates, and orophanx. Nl external ear canals without cough reflex   NECK :  without JVD/Nodes/TM/ nl carotid upstrokes bilaterally   LUNGS: no acc  muscle use, distant bs bilaterally without cough on insp or exp maneuvers   CV:  RRR  no s3 or murmur or increase in P2, no edema   ABD:  soft and nontender with nl excursion in the supine position. No bruits or organomegaly, bowel sounds  nl  MS:  warm without deformities, calf tenderness, cyanosis or clubbing  SKIN: warm and dry without lesions    NEURO:  alert, approp, no deficits      CXR PA and Lateral:   01/04/2015 :     I personally reviewed images and agree with radiology impression as follows:    There is no evidence of progressive disease nor other acute cardiopulmonary abnormality. If the patient's symptoms remain unexplained, further evaluation with chest CT scanning would be useful.        Assessment & Plan:

## 2015-01-21 ENCOUNTER — Encounter: Payer: Self-pay | Admitting: Internal Medicine

## 2015-01-21 NOTE — Assessment & Plan Note (Signed)
Body mass index is 30.42  Trending slt down  Lab Results  Component Value Date   TSH 0.971 04/30/2013     Contributing to gerd tendency/ doe/reviewed need  achieve and maintain neg calorie balance > defer f/u primary care including intermittently monitoring thyroid status

## 2015-01-21 NOTE — Assessment & Plan Note (Addendum)
-   quit smoking 2009 - 12/06/09 PFTs FEV1  1.75 (51%) p 14% improvement from saba and ratio 40 and dlco 54 corrects to 75% - 07/10/2014  Walked RA  2 laps @ 185 ft each stopped due to  Sob, no desat moderate pace  05/10/2015 p extensive coaching HFA effectiveness =    75% > try symbicort 160 2bid  - PFTs 08/28/14  FEV1  1.26 (37%) ratio 31 p 24% better p alb and DLCO 26% - 01/04/2015 p extensive coaching HFA effectiveness =    90%  - Incruse trial 01/18/2015 due to insurance restrictions  The proper method of use, as well as anticipated side effects, of a dry powder  inhaler are discussed and demonstrated to the patient. Improved effectiveness after extensive coaching during this visit to a level of approximately  95%    I had an extended discussion with the patient reviewing all relevant studies completed to date and  lasting 15 to 20 minutes of a 25 minute visit    Formulary restrictions will be an ongoing challenge for the forseable future and I would be happy to pick an alternative if the pt will first  provide me a list of them but pt  will need to return here for training for any new device that is required eg dpi vs hfa vs respimat.    In meantime we can always provide samples so the patient never runs out of any needed respiratory medications.   Each maintenance medication was reviewed in detail including most importantly the difference between maintenance and prns and under what circumstances the prns are to be triggered using an action plan format that is not reflected in the computer generated alphabetically organized AVS.    Please see instructions for details which were reviewed in writing and the patient given a copy highlighting the part that I personally wrote and discussed at today's ov.

## 2015-02-01 ENCOUNTER — Telehealth: Payer: Self-pay | Admitting: Cardiology

## 2015-02-01 NOTE — Telephone Encounter (Signed)
Pt calling re new med not covered by Medicare , however they will cover Keizer, Digox, or Digoxin, it will still need prior auth at [phone] before pt can get rx and get it filled

## 2015-02-01 NOTE — Telephone Encounter (Signed)
Give him samples for now and see if we can get him on a program through the company to help cover.

## 2015-02-01 NOTE — Telephone Encounter (Signed)
Pt said that he has been taking Ranexa 500 mg twice a day for some time. Pt is now cover by medicare and they won't cover this medication. Medicare  will cover Mantador, Digox or Digoxin but it will need prior authorization need to call at [phone].

## 2015-02-02 NOTE — Telephone Encounter (Signed)
Noted  

## 2015-02-02 NOTE — Telephone Encounter (Signed)
Spoke with pt and told him I would leave samples of Ranexa and assistance form application at front desk for him.  He is aware to complete application and return to our office with copy of proof of income.  Ranexa 500 mg, 56 tablets, Lot [id number], exp 8/19 left at front desk along with assistance application.

## 2015-02-09 ENCOUNTER — Other Ambulatory Visit: Payer: Self-pay | Admitting: *Deleted

## 2015-02-09 ENCOUNTER — Ambulatory Visit (INDEPENDENT_AMBULATORY_CARE_PROVIDER_SITE_OTHER): Payer: PPO | Admitting: Cardiology

## 2015-02-09 ENCOUNTER — Telehealth: Payer: Self-pay | Admitting: *Deleted

## 2015-02-09 VITALS — BP 118/70 | HR 80 | Ht 70.0 in | Wt 212.5 lb

## 2015-02-09 DIAGNOSIS — C3490 Malignant neoplasm of unspecified part of unspecified bronchus or lung: Secondary | ICD-10-CM

## 2015-02-09 DIAGNOSIS — Z9861 Coronary angioplasty status: Secondary | ICD-10-CM | POA: Diagnosis not present

## 2015-02-09 DIAGNOSIS — I2510 Atherosclerotic heart disease of native coronary artery without angina pectoris: Secondary | ICD-10-CM

## 2015-02-09 DIAGNOSIS — K222 Esophageal obstruction: Secondary | ICD-10-CM

## 2015-02-09 DIAGNOSIS — E43 Unspecified severe protein-calorie malnutrition: Secondary | ICD-10-CM

## 2015-02-09 DIAGNOSIS — I4891 Unspecified atrial fibrillation: Secondary | ICD-10-CM | POA: Diagnosis not present

## 2015-02-09 LAB — LIPID PANEL
CHOL/HDL RATIO: 4
CHOLESTEROL: 126 mg/dL (ref 0–200)
HDL: 31.3 mg/dL — ABNORMAL LOW (ref >39.00)
LDL CALC: 70 mg/dL (ref 0–99)
NonHDL: 94.41
TRIGLYCERIDES: 123 mg/dL (ref 0.0–149.0)
VLDL: 24.6 mg/dL (ref 0.0–40.0)

## 2015-02-09 LAB — BASIC METABOLIC PANEL
BUN: 16 mg/dL (ref 6–23)
CHLORIDE: 99 meq/L (ref 96–112)
CO2: 28 meq/L (ref 19–32)
Calcium: 9.3 mg/dL (ref 8.4–10.5)
Creatinine, Ser: 1.56 mg/dL — ABNORMAL HIGH (ref 0.40–1.50)
GFR: 48.6 mL/min — ABNORMAL LOW (ref >60.00)
Glucose, Bld: 334 mg/dL — ABNORMAL HIGH (ref 70–99)
POTASSIUM: 4.5 meq/L (ref 3.5–5.1)
Sodium: 134 mEq/L — ABNORMAL LOW (ref 135–145)

## 2015-02-09 NOTE — Patient Instructions (Signed)
Medication Instructions:  Stop Plavix.  Start aspirin '81mg'$  daily.   Labwork: BMET/Lipid profile today  Testing/Procedures: None today  Follow-Up: Your physician wants you to follow-up in: 4 months with Dr Aundra Dubin. (January 2017).  You will receive a reminder letter in the mail two months in advance. If you don't receive a letter, please call our office to schedule the follow-up appointment.   Any Other Special Instructions Will Be Listed Below (If Applicable).  You have been referred to [facility].  The [facility] has samples of Ranexa for you. [place] Phone number [phone]

## 2015-02-09 NOTE — Telephone Encounter (Signed)
Pt has been given Patient Assistance Program application to complete  for Ranexa. Pt has been instructed to complete the Patient Section of the Patient Assistance Program application and return it to [employer] in our office.  Pt verbalized understanding.

## 2015-02-11 ENCOUNTER — Encounter: Payer: Self-pay | Admitting: Cardiology

## 2015-02-11 NOTE — Progress Notes (Signed)
Patient ID: [name], male   DOB: 1956/04/02, 59 y.o.   MRN: [id number] PCP: Dr. [name] Section  59 yo with h/o HTN, DM, COPD, small cell lung cancer, and CAD presents for cardiology followup.  He had NSTEMI in 2010.  At that time, he was found to have a totally occluded mid RCA (known from prior studies) and a tight distal  CFX stenosis. In 12/13, he had begun to develop increased exertional chest pain.  I took him for [facility] in 12/13, showing total occlusion of the PLOM with collaterals.  There was 80% in-stent restenosis in the mid LCx. A drug-eluting stent was placed in the mid CFX.  He had NSTEMI again in 4/15 with totally occluded mid LCX, requiring DES x 2.    In 3/15, he had bilateral PEs.  He has been diagnosed with small cell lung cancer treated with radiation and chemotherapy.  This is now being observed.  Last echo in 4/15 showed EF 60-65% with normal RV.    He has an esophageal stricture in need of dilation, likely from radiation with cancer treatment.  He has dysphagia from stricture.  Patient has stable chronic angina with climbing stairs fast and emotional stress.  Stable dyspnea after walking 100 yards on flat ground or walking around the grocery store.   Labs (6/10):  Creatinine 1.48, K normal Labs (5/11): K 4, creatinine 1.6, LDL 53, HDL 30 Labs (8/13): LDL 73, HDL 26, K 4.5, creatinine 1.58 Labs (12/13): K 4.4, creatinine 1.6 Labs (4/16): K 4.4, creatinine 1.7, HCT 41.2.   ECG: NSR, lateral T wave inversion, low voltage.   Allergies (verified):  1)  ! Pcn  Family History: Family history of stroke. Brother died with MI.   Social History: Out of work on disability.  Single  Tobacco Use - Former smoker.  Quit in May 2011.  1ppd x 38 yrs Alcohol Use - yes Regular Exercise - yes Drug Use - no  Past Medical History: 1.  CAD:  PCI 3/08 to OM2 and mid CFX.  NSTEMI 6/10.  Prior stents patent.  90% mid CFX, totally occluded mRCA (old) with collaterals.  EF 55% on LV-gram.  Xience  DES 2.5 x 23 mm to mid CFX.  Echo (7/12): EF 67-67%, grade I diastolic dysfunction, normal RV size and systolic function. LHC (12/13) with 50% pLAD, 80% ostial OM1, total occlusion of the PLOM with collaterals, 80% mLCx in-stent restenosis.  Medical management as the territory beyond the mid LCx was not extensive since the [facility] was occluded. NSTEMI (4/15) with 40-50% pLAD, chronic RCA occlusion, total occlusion of mLCx treated with DES x 2.  Echo (4/15) with EF 60-65%, normal RV size and systolic function.  2.  Dyslipidemia 3.  Hyperlipidemia 4.  COPD:   - quit tobacco 5/11.   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 [name] - Aug 2011 5.  CKD 6.  Obesity 7.  History of CVA without residual deficits 8.  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  - Rx for CAP end May 2011  - Persistent and PET positive - Aug 2011  - ENB Bx 02/06/2010 - indeterminate  - Onciummne lung cancer antigen panel - Negative  03/01/2010 (test of  poor sensitivity)    - Culture with Mycobacterium kansasii 9. PE: Bilateral PEs in 3/15.  10. Small cell lung cancer: Treated with chemotherapy and radiation.  11. Esophageal stricture 12. Atrial fibrillation: Paroxysmal.    ROS: All systems reviewed and negative except as per HPI.   Current Outpatient Prescriptions  Medication Sig Dispense Refill  . Aclidinium Bromide (TUDORZA PRESSAIR) 400 MCG/ACT AEPB Inhale 1 puff into the lungs 2 (two) times daily. One twice daily 1 each 11  . albuterol (PROVENTIL HFA;VENTOLIN HFA) 108 (90 BASE) MCG/ACT inhaler Inhale 2 puffs into the lungs every 6 (six) hours as needed for wheezing or shortness of breath. 1 Inhaler 2  . albuterol (PROVENTIL) (2.5 MG/3ML) 0.083% nebulizer solution Take 3 mLs (2.5  mg total) by nebulization every 6 (six) hours as needed for wheezing or shortness of breath. 360 mL 1  . atorvastatin (LIPITOR) 40 MG tablet TAKE  ONE TABLET BY MOUTH NIGHTLY AT BEDTIME 30 tablet 1  . budesonide-formoterol (SYMBICORT) 160-4.5 MCG/ACT inhaler Take 2 puffs first thing in am and then another 2 puffs about 12 hours later. 1 Inhaler 11  . enoxaparin (LOVENOX) 150 MG/ML injection INJECT 0.93 MLS (140 MG TOTAL) INTO THE SKIN DAILY 30 mL 2  . isosorbide mononitrate (IMDUR) 60 MG 24 hr tablet TAKE ONE TABLET BY MOUTH EVERY DAY IN THE MORNING. 30 tablet 3  . metoprolol (LOPRESSOR) 50 MG tablet Take 1 tablet (50 mg total) by mouth 2 (two) times daily. 60 tablet 5  . ondansetron (ZOFRAN) 4 MG tablet TAKE ONE TABLET BY MOUTH EVERY FOUR HOURS AS NEEDED FOR NAUSEA AND VOMMITING 30 tablet 2  . pantoprazole (PROTONIX) 40 MG tablet TAKE ONE TABLET BY MOUTH TWICE DAILY 60 tablet 12  . RANEXA 500 MG 12 hr tablet TAKE ONE TABLET BY MOUTH TWICE DAILY 60 tablet 5  . aspirin EC 81 MG tablet Take 1 tablet (81 mg total) by mouth daily.    . rivaroxaban (XARELTO) 20 MG TABS tablet Take 1 tablet (20 mg total) by mouth daily with supper. (Patient not taking: Reported on 01/18/2015) 30 tablet 3  . Umeclidinium Bromide (INCRUSE ELLIPTA) 62.5 MCG/INH AEPB Inhale 1 puff into the lungs every morning. (Patient not taking: Reported on 02/09/2015) 30 each 11   No current facility-administered medications for this visit.    BP 118/70 mmHg  Pulse 80  Ht '5\' 10"'$  (1.778 m)  Wt 212 lb 8 oz (96.389 kg)  BMI 30.49 kg/m2 General:  Well developed, well nourished, in no acute distress.  Obese.  Neck:  Neck thick, no JVD. No masses, thyromegaly or abnormal cervical nodes. Lungs:  Distant breath sounds bilaterally.  Heart:  Non-displaced PMI, chest non-tender; regular rate and rhythm, S1, S2 without murmurs, rubs or gallops. Carotid upstroke normal, no bruit. Pedals normal pulses. Trace ankle edema, no varicosities. Abdomen:   Bowel sounds positive; abdomen soft and non-tender without masses, organomegaly, or hernias noted. No hepatosplenomegaly. Extremities:  No clubbing or cyanosis. Neurologic:  Alert and oriented x 3. Psych:  Depressed affect.  Assessment/Plan:  1. CAD: s/p NSTEMI with DES x 2 to mLCx in 4/15.  Has chronically occluded RCA.  EF preserved on 4/15 echo.  He has chronic stable angina with heavy exertion. - Continue Ranexa, metoprolol, and Imdur for angina control. - Continue statin, check lipids today.  - He is now on Plavix + Xarelto.  It is > 1 year out from stents, for now will have him stop Plavix and take  ASA 81 daily.   2. Hyperlipidemia: Check lipids today.  3. Dyspnea: Stable.  Most likely this is from COPD.   4. HTN: BP is controlled.  5. H/o PE: Patient is now on Xarelto.  Given small cell lung cancer, this will be continued long-term.  6. Small cell lung cancer: He has completed treatment and is currently undergoing observation.  7. Dysphagia: Concern for esophageal stricture possibly related to radiation for lung cancer.  Now that he is off Plavix, he can undergo esophageal dilatation if needed (can hold Xarelto).  Will refer him to GI.  8. Atrial fibrillation: Paroxysmal.  He is in NSR and is on Xarelto.   Loralie Champagne 02/11/2015

## 2015-02-12 ENCOUNTER — Telehealth: Payer: Self-pay | Admitting: Family Medicine

## 2015-02-12 NOTE — Telephone Encounter (Signed)
Please advise 

## 2015-02-12 NOTE — Telephone Encounter (Signed)
Pt is no longer using the oxygen ans would like [place] to send orders to [facility] so they can pick up the equipment. [phone]. Thanks TNP

## 2015-02-13 ENCOUNTER — Ambulatory Visit (HOSPITAL_COMMUNITY)
Admission: RE | Admit: 2015-02-13 | Discharge: 2015-02-13 | Disposition: A | Discharge status: Home / Self Care | Payer: PPO | Source: Ambulatory Visit | Attending: Internal Medicine | Admitting: Internal Medicine

## 2015-02-13 ENCOUNTER — Telehealth: Payer: Self-pay | Admitting: Internal Medicine

## 2015-02-13 DIAGNOSIS — J439 Emphysema, unspecified: Secondary | ICD-10-CM | POA: Diagnosis not present

## 2015-02-13 DIAGNOSIS — I313 Pericardial effusion (noninflammatory): Secondary | ICD-10-CM | POA: Diagnosis not present

## 2015-02-13 DIAGNOSIS — J449 Chronic obstructive pulmonary disease, unspecified: Secondary | ICD-10-CM

## 2015-02-13 DIAGNOSIS — C3490 Malignant neoplasm of unspecified part of unspecified bronchus or lung: Secondary | ICD-10-CM | POA: Diagnosis present

## 2015-02-13 MED ORDER — IOHEXOL 300 MG/ML  SOLN
75.0000 mL | Freq: Once | INTRAMUSCULAR | Status: AC | PRN
  Administered 2015-02-13: 75 mL via INTRAVENOUS

## 2015-02-13 NOTE — Telephone Encounter (Signed)
Order has been placed. Pt is aware. Nothing further was needed.

## 2015-02-13 NOTE — Telephone Encounter (Signed)
Samples have been given to the pt. Nothing further was needed. 

## 2015-02-13 NOTE — Telephone Encounter (Signed)
Please call [facility] to discontinue oxygen. Thanks.

## 2015-02-13 NOTE — Telephone Encounter (Signed)
Fine with me

## 2015-02-13 NOTE — Telephone Encounter (Signed)
Spoke with pt. States that he would like his oxygen picked up, he is no longer using.  MW - please advise. Thanks.

## 2015-02-14 NOTE — Telephone Encounter (Signed)
Called [facility] for fax number to send order. Fax [phone]. Order faxed.

## 2015-02-16 ENCOUNTER — Other Ambulatory Visit: Payer: Self-pay | Admitting: Internal Medicine

## 2015-02-16 MED ORDER — ACLIDINIUM BROMIDE 400 MCG/ACT IN AEPB: 1.0000 | INHALATION_SPRAY | Freq: Two times a day (BID) | RESPIRATORY_TRACT | Status: DC

## 2015-02-16 MED ORDER — ALBUTEROL SULFATE HFA 108 (90 BASE) MCG/ACT IN AERS
2.0000 | INHALATION_SPRAY | Freq: Four times a day (QID) | RESPIRATORY_TRACT | Status: DC | PRN
Start: 2015-02-16 — End: 2015-05-15

## 2015-02-16 MED ORDER — BUDESONIDE-FORMOTEROL FUMARATE 160-4.5 MCG/ACT IN AERO
INHALATION_SPRAY | RESPIRATORY_TRACT | Status: DC
Start: 2015-02-16 — End: 2015-04-22

## 2015-02-20 ENCOUNTER — Other Ambulatory Visit (HOSPITAL_BASED_OUTPATIENT_CLINIC_OR_DEPARTMENT_OTHER): Payer: PPO

## 2015-02-20 DIAGNOSIS — C3490 Malignant neoplasm of unspecified part of unspecified bronchus or lung: Secondary | ICD-10-CM | POA: Diagnosis not present

## 2015-02-20 LAB — COMPREHENSIVE METABOLIC PANEL (CC13)
ALT: 12 U/L (ref 0–55)
ANION GAP: 8 meq/L (ref 3–11)
AST: 10 U/L (ref 5–34)
Albumin: 3.5 g/dL (ref 3.5–5.0)
Alkaline Phosphatase: 103 U/L (ref 40–150)
BUN: 18.2 mg/dL (ref 7.0–26.0)
CHLORIDE: 101 meq/L (ref 98–109)
CO2: 27 meq/L (ref 22–29)
Calcium: 9 mg/dL (ref 8.4–10.4)
Creatinine: 1.8 mg/dL — ABNORMAL HIGH (ref 0.7–1.3)
EGFR: 41 mL/min/{1.73_m2} — AB (ref >90)
Glucose: 267 mg/dl — ABNORMAL HIGH (ref 70–140)
POTASSIUM: 4.4 meq/L (ref 3.5–5.1)
Sodium: 136 mEq/L (ref 136–145)
Total Bilirubin: 0.92 mg/dL (ref 0.20–1.20)
Total Protein: 6.5 g/dL (ref 6.4–8.3)

## 2015-02-20 LAB — CBC WITH DIFFERENTIAL/PLATELET
BASO%: 0.9 % (ref 0.0–2.0)
BASOS ABS: 0.1 10*3/uL (ref 0.0–0.1)
EOS ABS: 0.1 10*3/uL (ref 0.0–0.5)
EOS%: 1.6 % (ref 0.0–7.0)
HEMATOCRIT: 43.6 % (ref 38.4–49.9)
HEMOGLOBIN: 15.2 g/dL (ref 13.0–17.1)
LYMPH#: 0.7 10*3/uL — AB (ref 0.9–3.3)
LYMPH%: 10.4 % — ABNORMAL LOW (ref 14.0–49.0)
MCH: 33.5 pg — AB (ref 27.2–33.4)
MCHC: 34.8 g/dL (ref 32.0–36.0)
MCV: 96.3 fL (ref 79.3–98.0)
MONO#: 0.6 10*3/uL (ref 0.1–0.9)
MONO%: 8.2 % (ref 0.0–14.0)
NEUT#: 5.4 10*3/uL (ref 1.5–6.5)
NEUT%: 78.9 % — ABNORMAL HIGH (ref 39.0–75.0)
Platelets: 172 10*3/uL (ref 140–400)
RBC: 4.53 10*6/uL (ref 4.20–5.82)
RDW: 15.1 % — AB (ref 11.0–14.6)
WBC: 6.8 10*3/uL (ref 4.0–10.3)

## 2015-02-26 ENCOUNTER — Ambulatory Visit: Payer: Self-pay | Admitting: Physician Assistant

## 2015-02-27 ENCOUNTER — Telehealth: Payer: Self-pay | Admitting: Internal Medicine

## 2015-02-27 ENCOUNTER — Encounter: Payer: Self-pay | Admitting: Internal Medicine

## 2015-02-27 ENCOUNTER — Ambulatory Visit (HOSPITAL_BASED_OUTPATIENT_CLINIC_OR_DEPARTMENT_OTHER): Payer: PPO | Admitting: Internal Medicine

## 2015-02-27 VITALS — BP 104/54 | HR 125 | Temp 97.7°F | Resp 22 | Ht 70.0 in | Wt 208.9 lb

## 2015-02-27 DIAGNOSIS — C3492 Malignant neoplasm of unspecified part of left bronchus or lung: Secondary | ICD-10-CM

## 2015-02-27 DIAGNOSIS — Z85118 Personal history of other malignant neoplasm of bronchus and lung: Secondary | ICD-10-CM | POA: Diagnosis not present

## 2015-02-27 NOTE — Telephone Encounter (Signed)
Gave and printed appt sched and avs for pt for April 2017

## 2015-02-27 NOTE — Progress Notes (Signed)
[place]  Telephone:([phone]) [phone] Fax:([phone]) [phone] OFFICE VISIT PROGRESS NOTE  Lelon Huh, MD [address] [place] [address]  DIAGNOSIS: Limited stage, Stage IIIA (T2a, N2, M0), Small cell carcinoma of lung, diagnosed in October of 2014, involving the left lower lobe and mediastinal lymphadenopathy.     PRIOR THERAPY:  1) Systemic chemotherapy with carboplatin for an AUC of 5 given on day 1 and etoposide at 120 mg per meter squared given on days 1, 2 and 3 with Neulasta support given on day 4 given every 3 weeks. Status post 4 cycles.  2) prophylactic cranial irradiation completed on 09/29/2013 under the care of Dr. Pablo Ledger.  CURRENT THERAPY: Observation.  DISEASE STAGE: Limited stage small cell lung cancer Small cell carcinoma of lung   Primary site: Lung (Left)   Staging method: AJCC 7th Edition   Clinical: Stage IIIA (T2a, N2, M0)   Summary: Stage IIIA (T2a, N2, M0) Malignant neoplasm of lower lobe of left lung   Primary site: Lung (Left)   Staging method: AJCC 7th Edition   Summary: Incomplete stage  CHEMOTHERAPY INTENT: Palliative  CURRENT # OF CHEMOTHERAPY CYCLES: 0  CURRENT ANTIEMETICS: Zofran, dexamethasone and Compazine  CURRENT SMOKING STATUS: Former smoker, quit 09/09/2009  ORAL CHEMOTHERAPY AND CONSENT: N./A  CURRENT BISPHOSPHONATES USE: None  PAIN MANAGEMENT: None  NARCOTICS INDUCED CONSTIPATION: None  LIVING WILL AND CODE STATUS: ?   INTERVAL HISTORY: [name] 59 y.o. male returns for routine six-month followup visit. The patient is feeling fine today with no specific complaints. Unfortunately he lost his brother to massive heart attack 2 weeks ago. He denied having any fever or chills. He denied having any nausea or vomiting. The patient has no chest pain, shortness of breath, cough or hemoptysis. He is followed by Dr. Melvyn Novas for his COPD. He has repeat CT scan of the chest performed recently and he is  here for evaluation and discussion of his scan results.  MEDICAL HISTORY: Past Medical History  Diagnosis Date  . CAD (coronary artery disease)     a. PCI 3/08 to OM2 and mid CFX; b.  NSTEMI 6/10:  Xience DES to distal CFX;  c. abnl MV 12/13 => LHC 04/23/12:  Med Rx planned;  d.  NSTEMI - LHC (08/23/13):  Inf and inferolateral HK, EF 40-45%, prox LAD 40-50%, CFX occluded at prox stent edge, OM2 stent occluded, RCA occluded.  PCI:  DES x 2 to CFX  . Diabetes mellitus type II   . Hyperlipidemia   . COPD (chronic obstructive pulmonary disease) ([place])     Quit tobacco 09/2009; Gold Stage 2 with asthma component - FEV1 1.53 L/54%, 14% fev1 BD response, DLCO 54% - July 2011; MM genotype 01/07/10; unable to afford spiriva and unwilling to try ics due to prior renal failure: state to Dr. [name], Aug 2011; started on atrovent HFA fall 2011. no desturation on walk test Dec 2011  . CKD (chronic kidney disease)   . Obesity   . History of CVA (cerebrovascular accident) 2007    Without residual deficits  . 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 no further rx  . Hypertension   . Asthma  as child  . Small cell lung cancer (HCC)     Stage IIIA - s/p chemotherapy and radiation  . Radiation 03/16/13-04/28/13    Left lung 60Gy  . Pneumonia     02/14/2013 in hospital  . History of chemotherapy finished dec 2014  . On home O2     constant/wears 2 liters prn  . Bilateral pulmonary embolism ([place]) 08-03-2013    tx with Lovenox  . H/O non-ST elevation myocardial infarction (NSTEMI)     10/2008;  08/2013  . Radiation esophagitis     s/p esophageal dilation  . Radiation 09/13/13-09/29/13    Whole Brain Prophylactic  24 Gy in 12 Fx's  . TB (tuberculosis)     hx of    ALLERGIES:  is allergic to fentanyl; azithromycin; erythromycin; and penicillins.  MEDICATIONS:  Current Outpatient Prescriptions  Medication Sig Dispense Refill  . Aclidinium Bromide (TUDORZA PRESSAIR) 400 MCG/ACT AEPB Inhale 1 puff into the lungs 2 (two) times daily. One twice daily 1 each 2  . aspirin EC 81 MG tablet Take 1 tablet (81 mg total) by mouth daily.    Marland Kitchen atorvastatin (LIPITOR) 40 MG tablet TAKE  ONE TABLET BY MOUTH NIGHTLY AT BEDTIME 30 tablet 1  . budesonide-formoterol (SYMBICORT) 160-4.5 MCG/ACT inhaler Take 2 puffs first thing in am and then another 2 puffs about 12 hours later. 1 Inhaler 2  . enoxaparin (LOVENOX) 150 MG/ML injection INJECT 0.93 MLS (140 MG TOTAL) INTO THE SKIN DAILY 30 mL 2  . isosorbide mononitrate (IMDUR) 60 MG 24 hr tablet TAKE ONE TABLET BY MOUTH EVERY DAY IN THE MORNING. 30 tablet 3  . metoprolol (LOPRESSOR) 50 MG tablet Take 1 tablet (50 mg total) by mouth 2 (two) times daily. 60 tablet 5  . ondansetron (ZOFRAN) 4 MG tablet TAKE ONE TABLET BY MOUTH EVERY FOUR HOURS AS NEEDED FOR NAUSEA AND VOMMITING 30 tablet 2  . pantoprazole (PROTONIX) 40 MG tablet TAKE ONE TABLET BY MOUTH TWICE DAILY 60 tablet 12  . RANEXA 500 MG 12 hr tablet TAKE ONE TABLET BY MOUTH TWICE DAILY 60 tablet 5  . albuterol (PROVENTIL HFA;VENTOLIN HFA) 108 (90 BASE) MCG/ACT inhaler Inhale 2 puffs into the lungs every 6 (six) hours as needed for wheezing or shortness of breath. (Patient not taking: Reported on 02/27/2015) 1 Inhaler 2  . albuterol (PROVENTIL) (2.5 MG/3ML) 0.083% nebulizer solution Take 3 mLs (2.5 mg total) by nebulization every 6 (six) hours as needed for wheezing or shortness of breath. (Patient not taking: Reported on 02/27/2015) 360 mL 1  . rivaroxaban (XARELTO) 20 MG TABS tablet Take 1 tablet (20 mg total) by mouth daily with supper. (Patient not taking: Reported on 01/18/2015) 30 tablet 3  . Umeclidinium Bromide  (INCRUSE ELLIPTA) 62.5 MCG/INH AEPB Inhale 1 puff into the lungs every morning. (Patient not taking: Reported on 02/09/2015) 30 each 11   No current facility-administered medications for this visit.    SURGICAL HISTORY:  Past Surgical History  Procedure Laterality Date  . Wedge resection  03/28/2010  . Coronary stent placement  2009    x 2  . Cardiac catheterization  2009  . Endobronchial ultrasound Bilateral 02/28/2013    Procedure: ENDOBRONCHIAL ULTRASOUND;  Surgeon: Brand Males, MD;  Location: [facility] ENDOSCOPY;  Service: Cardiopulmonary;  Laterality: Bilateral;  . Esophagogastroduodenoscopy N/A 05/04/2013    Procedure: ESOPHAGOGASTRODUODENOSCOPY (EGD);  Surgeon: Inda Castle, MD;  Location: [facility];  Service: Endoscopy;  Laterality: N/A;  . Esophagogastroduodenoscopy N/A 07/06/2013  Procedure: ESOPHAGOGASTRODUODENOSCOPY (EGD);  Surgeon: Inda Castle, MD;  Location: [facility]OS[facility];  Service: Endoscopy;  Laterality: N/A;  . Savory dilation N/A 07/06/2013    Procedure: SAVORY DILATION;  Surgeon: Inda Castle, MD;  Location: [facility];  Service: Endoscopy;  Laterality: N/A;  . Esophagogastroduodenoscopy N/A 07/14/2013    Procedure: ESOPHAGOGASTRODUODENOSCOPY (EGD);  Surgeon: Inda Castle, MD;  Location: [facility];  Service: Endoscopy;  Laterality: N/A;  . Savory dilation N/A 07/14/2013    Procedure: SAVORY DILATION;  Surgeon: Inda Castle, MD;  Location: [facility];  Service: Endoscopy;  Laterality: N/A;  . Esophagogastroduodenoscopy N/A 07/26/2013    Procedure: ESOPHAGOGASTRODUODENOSCOPY (EGD);  Surgeon: Inda Castle, MD;  Location: [facility];  Service: Endoscopy;  Laterality: N/A;  . Savory dilation N/A 07/26/2013    Procedure: SAVORY DILATION;  Surgeon: Inda Castle, MD;  Location: [facility];  Service: Endoscopy;  Laterality: N/A;  . Left heart catheterization with coronary angiogram N/A 08/23/2013    Procedure: LEFT HEART CATHETERIZATION WITH CORONARY  ANGIOGRAM;  Surgeon: Leonie Man, MD;  Location: [facility] CATH LAB;  Service: Cardiovascular;  Laterality: N/A;    REVIEW OF SYSTEMS:  A comprehensive review of systems was negative.   PHYSICAL EXAMINATION: General appearance: alert, cooperative, appears stated age and no distress Head: Normocephalic, without obvious abnormality, atraumatic Neck: no adenopathy, no carotid bruit, no JVD, supple, symmetrical, trachea midline and thyroid not enlarged, symmetric, no tenderness/mass/nodules Lymph nodes: Cervical, supraclavicular, and axillary nodes normal. Resp: clear to auscultation bilaterally Cardio: regular rate and rhythm, S1, S2 normal, no murmur, click, rub or gallop GI: soft, non-tender; bowel sounds normal; no masses,  no organomegaly Extremities: extremities normal, atraumatic, no cyanosis or edema Neurologic: Alert and oriented X 3, normal strength and tone. Normal symmetric reflexes. Normal coordination and gait   ECOG PERFORMANCE STATUS: 1 - Symptomatic but completely ambulatory  Blood pressure 104/54, pulse 125, temperature 97.7 F (36.5 C), temperature source Oral, resp. rate 22, height '5\' 10"'$  (1.778 m), weight 208 lb 14.4 oz (94.756 kg), SpO2 96 %.  LABORATORY DATA: Lab Results  Component Value Date   WBC 6.8 02/20/2015   HGB 15.2 02/20/2015   HCT 43.6 02/20/2015   MCV 96.3 02/20/2015   PLT 172 02/20/2015      Chemistry      Component Value Date/Time   NA 136 02/20/2015 0941   NA 134* 02/09/2015 1407   K 4.4 02/20/2015 0941   K 4.5 02/09/2015 1407   CL 99 02/09/2015 1407   CO2 27 02/20/2015 0941   CO2 28 02/09/2015 1407   BUN 18.2 02/20/2015 0941   BUN 16 02/09/2015 1407   CREATININE 1.8* 02/20/2015 0941   CREATININE 1.56* 02/09/2015 1407   CREATININE 1.46* 12/10/2010 0843      Component Value Date/Time   CALCIUM 9.0 02/20/2015 0941   CALCIUM 9.3 02/09/2015 1407   ALKPHOS 103 02/20/2015 0941   ALKPHOS 104 08/03/2014 1101   AST 10 02/20/2015 0941   AST 22  08/03/2014 1101   ALT 12 02/20/2015 0941   ALT 14 08/03/2014 1101   BILITOT 0.92 02/20/2015 0941   BILITOT 0.9 08/03/2014 1101       RADIOGRAPHIC STUDIES: Ct Chest W Contrast  02/13/2015  CLINICAL DATA:  Small cell lung cancer diagnosed 2 years ago. Chemotherapy and radiation therapy completed in 2015. Subsequent encounter. EXAM: CT CHEST WITH CONTRAST TECHNIQUE: Multidetector CT imaging of the chest was performed during intravenous contrast administration. CONTRAST:  38m OMNIPAQUE  IOHEXOL 300 MG/ML  SOLN COMPARISON:  Chest CT 08/22/2014. FINDINGS: Mediastinum/Nodes: There are stable right paratracheal nodes measuring up to 11 mm on image 13. No progressive mediastinal, hilar or axillary lymphadenopathy identified. The thyroid gland, trachea and esophagus demonstrate no significant findings. The heart size is normal. A chronic pericardial effusion has mildly improved compared with the prior study. There is diffuse atherosclerosis of the aorta, great vessels and coronary arteries. Smooth peripheral filling defect laterally in the left pulmonary artery is unchanged. No acute pulmonary emboli demonstrated. Lungs/Pleura: There is no pleural effusion. The lungs appear unchanged. There are diffuse emphysematous changes. There are postsurgical changes in the right upper lobe with right hilar distortion. Central mid paramediastinal radiation changes are present within the left lung with hilar distortion and volume loss. No suspicious pulmonary nodules are present. Upper abdomen: The visualized upper abdomen has a stable appearance without suspicious findings. No evidence of adrenal nodule. Musculoskeletal/Chest wall: There is no chest wall mass or suspicious osseous finding. Stable right-sided thoracotomy defects. Mild bilateral gynecomastia. IMPRESSION: 1. Stable postoperative appearance of the chest with probable radiation changes bilaterally. No evidence of local recurrence or metastatic disease. 2. Stable  chronic right paratracheal lymph nodes. No progressive adenopathy. 3. Chronic pericardial effusion appears slightly improved. 4. Stable chronic peripheral defect laterally in the left main pulmonary artery. Given its persistence, this finding may also be influenced by prior radiation therapy as some interval resorption of simple chronic thromboembolic disease would be expected. 5. Diffuse emphysema. Electronically Signed   By: Richardean Sale M.D.   On: 02/13/2015 16:26   ASSESSMENT/PLAN: This is a very pleasant 59 years old white male with limited stage small cell lung cancer currently undergoing systemic chemotherapy concurrent with radiation, status post 4 cycles. The patient is tolerating his chemotherapy fairly well with no significant adverse effects. This was followed by prophylactic cranial irradiation. The patient has been observation since May 2015 with no evidence for disease recurrence. The recent CT scan of the chest showed no evidence for recurrence. I discussed the scan results with the patient. I recommended for him to continue on observation with repeat CT scan of the chest in 6 months. For the urine insufficiency and hyperglycemia, I strongly recommended for the patient to follow up with his family doctor for these issues. He was advised to call immediately if she has any concerning symptoms in the interval.  All questions were answered. The patient knows to call the clinic with any problems, questions or concerns. We can certainly see the patient much sooner if necessary.  Disclaimer: This note was dictated with voice recognition software. Similar sounding words can inadvertently be transcribed and may not be corrected upon review.   Eilleen Kempf., MD 02/27/2015

## 2015-03-13 ENCOUNTER — Ambulatory Visit: Payer: Self-pay | Admitting: Family Medicine

## 2015-03-14 ENCOUNTER — Ambulatory Visit (INDEPENDENT_AMBULATORY_CARE_PROVIDER_SITE_OTHER): Payer: PPO | Admitting: Family Medicine

## 2015-03-14 ENCOUNTER — Encounter: Payer: Self-pay | Admitting: Family Medicine

## 2015-03-14 VITALS — BP 98/60 | HR 77 | Temp 97.6°F | Resp 16 | Wt 209.0 lb

## 2015-03-14 DIAGNOSIS — J449 Chronic obstructive pulmonary disease, unspecified: Secondary | ICD-10-CM

## 2015-03-14 DIAGNOSIS — N183 Chronic kidney disease, stage 3 unspecified: Secondary | ICD-10-CM | POA: Insufficient documentation

## 2015-03-14 DIAGNOSIS — E119 Type 2 diabetes mellitus without complications: Secondary | ICD-10-CM

## 2015-03-14 DIAGNOSIS — I10 Essential (primary) hypertension: Secondary | ICD-10-CM

## 2015-03-14 LAB — POCT GLYCOSYLATED HEMOGLOBIN (HGB A1C)
ESTIMATED AVERAGE GLUCOSE: 209
HEMOGLOBIN A1C: 8.9

## 2015-03-14 MED ORDER — MOMETASONE FURO-FORMOTEROL FUM 100-5 MCG/ACT IN AERO: 2.0000 | INHALATION_SPRAY | Freq: Two times a day (BID) | RESPIRATORY_TRACT | Status: DC

## 2015-03-14 MED ORDER — GLIPIZIDE 5 MG PO TABS
5.0000 mg | ORAL_TABLET | Freq: Every day | ORAL | Status: AC
Start: 2015-03-14 — End: ?

## 2015-03-14 NOTE — Progress Notes (Signed)
Patient: [name] Male    DOB: 11-13-1955   59 y.o.   MRN: [id number] Visit Date: 03/14/2015  Today's Provider: Lelon Huh, MD   Chief Complaint  Patient presents with  . Follow-up  . Hyperlipidemia  . Diabetes   Subjective:    HPI   Diabetes Mellitus Type II, Follow-up:   Lab Results  Component Value Date   HGBA1C 8.0* 09/25/2014   HGBA1C 5.6 08/21/2013   HGBA1C 5.3 08/03/2013   Last seen for diabetes 5 months ago.  Management since then includes; restarted metformin 500 mg qd, however he did not start this and instead has been focusing on improving diet. He report he has had quite a bit of stress lately and has been slipping on his diet, especially chocolates.  .He had labs checked by oncologist last month and creatinine was 1.8  Current symptoms include none and have been stable. Home blood sugar records: fasting range: 100-112  Episodes of hypoglycemia? no Most Recent Eye Exam: due Weight trend: stable Prior visit with dietician: no Current exercise: walking  ----------------------------------------------------------------------      Lipid/Cholesterol, Follow-up:   Last seen for this 5 months ago.  Management since that visit includes none.  Last Lipid Panel:    Component Value Date/Time   CHOL 126 02/09/2015 1407   TRIG 123.0 02/09/2015 1407   HDL 31.30* 02/09/2015 1407   CHOLHDL 4 02/09/2015 1407   VLDL 24.6 02/09/2015 1407   LDLCALC 70 02/09/2015 1407    He reports good compliance with treatment. He is not having side effects. none  Wt Readings from Last 3 Encounters:  03/14/15 209 lb (94.802 kg)  02/27/15 208 lb 14.4 oz (94.756 kg)  02/09/15 212 lb 8 oz (96.389 kg)    ------------------------------------------------------------------------ COPD He has been out of Symbicort due to cost, he gets a little short of breath on exertion, but is much better when he uses Symbicort.    Allergies  Allergen Reactions  . Fentanyl  Other (See Comments)    Delirium and violent; took with oxycodone and morphine last hospital stay at [facility]  . Azithromycin Other (See Comments)    Reaction while in hospital - pt doesn't know reaction  . Erythromycin Other (See Comments)    Reaction while in hospital - pt doesn't know reaction  . Penicillins Other (See Comments)    Childhood reaction - unknown   Previous Medications   ACLIDINIUM BROMIDE (TUDORZA PRESSAIR) 400 MCG/ACT AEPB    Inhale 1 puff into the lungs 2 (two) times daily. One twice daily   ALBUTEROL (PROVENTIL HFA;VENTOLIN HFA) 108 (90 BASE) MCG/ACT INHALER    Inhale 2 puffs into the lungs every 6 (six) hours as needed for wheezing or shortness of breath.   ALBUTEROL (PROVENTIL) (2.5 MG/3ML) 0.083% NEBULIZER SOLUTION    Take 3 mLs (2.5 mg total) by nebulization every 6 (six) hours as needed for wheezing or shortness of breath.   ASPIRIN EC 81 MG TABLET    Take 1 tablet (81 mg total) by mouth daily.   ATORVASTATIN (LIPITOR) 40 MG TABLET    TAKE  ONE TABLET BY MOUTH NIGHTLY AT BEDTIME   BUDESONIDE-FORMOTEROL (SYMBICORT) 160-4.5 MCG/ACT INHALER    Take 2 puffs first thing in am and then another 2 puffs about 12 hours later.   ENOXAPARIN (LOVENOX) 150 MG/ML INJECTION    INJECT 0.93 MLS (140 MG TOTAL) INTO THE SKIN DAILY   ISOSORBIDE MONONITRATE (IMDUR) 60 MG 24  HR TABLET    TAKE ONE TABLET BY MOUTH EVERY DAY IN THE MORNING.   METOPROLOL (LOPRESSOR) 50 MG TABLET    Take 1 tablet (50 mg total) by mouth 2 (two) times daily.   ONDANSETRON (ZOFRAN) 4 MG TABLET    TAKE ONE TABLET BY MOUTH EVERY FOUR HOURS AS NEEDED FOR NAUSEA AND VOMMITING   PANTOPRAZOLE (PROTONIX) 40 MG TABLET    TAKE ONE TABLET BY MOUTH TWICE DAILY   RANEXA 500 MG 12 HR TABLET    TAKE ONE TABLET BY MOUTH TWICE DAILY   RIVAROXABAN (XARELTO) 20 MG TABS TABLET    Take 1 tablet (20 mg total) by mouth daily with supper.   UMECLIDINIUM BROMIDE (INCRUSE ELLIPTA) 62.5 MCG/INH AEPB    Inhale 1 puff into the lungs every morning.     Review of Systems  Constitutional: Negative for fever, chills and appetite change.  Respiratory: Positive for shortness of breath. Negative for chest tightness and wheezing.   Cardiovascular: Negative for chest pain and palpitations.  Gastrointestinal: Negative for nausea, vomiting and abdominal pain.  Neurological: Negative for dizziness and light-headedness.    Social History  Substance Use Topics  . Smoking status: Former Smoker -- 1.00 packs/day for 40 years    Types: Cigarettes    Quit date: 09/09/2008  . Smokeless tobacco: Never Used  . Alcohol Use: 0.0 oz/week    0 Standard drinks or equivalent per week     Comment: occasional use   Objective:   BP 98/60 mmHg  Pulse 77  Temp(Src) 97.6 F (36.4 C) (Oral)  Resp 16  Wt 209 lb (94.802 kg)  SpO2 95%   Depression screen PHQ 2/9 03/14/2015  Decreased Interest 0  Down, Depressed, Hopeless 0  PHQ - 2 Score 0      Physical Exam   General Appearance:    Alert, cooperative, no distress  Eyes:    PERRL, conjunctiva/corneas clear, EOM's intact       Lungs:     Diminished breath sounds. respirations unlabored  Heart:    Regular rate and rhythm  Neurologic:   Awake, alert, oriented x 3. No apparent focal neurological           defect.       Results for orders placed or performed in visit on 03/14/15  POCT glycosylated hemoglobin (Hb A1C)  Result Value Ref Range   Hemoglobin A1C 8.9    Est. average glucose Bld gHb Est-mCnc 209         Assessment & Plan:     1. Type 2 diabetes mellitus without complication, without long-term current use of insulin (HCC) Worsening, defer metformin due to CKD. Restart glipizide. He is planning on being more strict with diet - POCT glycosylated hemoglobin (Hb A1C) - glipiZIDE (GLUCOTROL) 5 MG tablet; Take 1 tablet (5 mg total) by mouth daily before breakfast.  Dispense: 30 tablet; Refill: 3  2. Essential hypertension Well controlled.    3. COPD GOLD III Out of symbicort due to  cost. Given a sample of dulera - mometasone-formoterol (DULERA) 100-5 MCG/ACT AERO; Inhale 2 puffs into the lungs 2 (two) times daily.  Dispense: 1 Inhaler; Refill: 0  4. Chronic kidney disease (CKD), stage III (moderate)   Return in 3 months to check A1c.       Lelon Huh, MD  [facility]

## 2015-03-20 ENCOUNTER — Encounter: Payer: Self-pay | Admitting: Family Medicine

## 2015-03-20 ENCOUNTER — Telehealth: Payer: Self-pay | Admitting: Internal Medicine

## 2015-03-20 DIAGNOSIS — M8700 Idiopathic aseptic necrosis of unspecified bone: Secondary | ICD-10-CM | POA: Insufficient documentation

## 2015-03-20 DIAGNOSIS — I714 Abdominal aortic aneurysm, without rupture, unspecified: Secondary | ICD-10-CM | POA: Insufficient documentation

## 2015-03-20 DIAGNOSIS — K222 Esophageal obstruction: Secondary | ICD-10-CM | POA: Insufficient documentation

## 2015-03-20 DIAGNOSIS — J189 Pneumonia, unspecified organism: Secondary | ICD-10-CM | POA: Insufficient documentation

## 2015-03-20 DIAGNOSIS — I5032 Chronic diastolic (congestive) heart failure: Secondary | ICD-10-CM | POA: Insufficient documentation

## 2015-03-20 NOTE — Telephone Encounter (Signed)
Patient calling requesting sample of Symbicort 160 Left sample at front desk for patient to pick up Left message for patient to call back.

## 2015-03-21 NOTE — Telephone Encounter (Signed)
Pt aware this has been left for pick up. Nothing further needed

## 2015-03-23 ENCOUNTER — Other Ambulatory Visit: Payer: Self-pay | Admitting: Cardiology

## 2015-04-20 ENCOUNTER — Ambulatory Visit (INDEPENDENT_AMBULATORY_CARE_PROVIDER_SITE_OTHER): Payer: PPO | Admitting: Internal Medicine

## 2015-04-20 ENCOUNTER — Encounter: Payer: Self-pay | Admitting: Internal Medicine

## 2015-04-20 VITALS — BP 110/80 | HR 97 | Ht 70.0 in | Wt 216.0 lb

## 2015-04-20 DIAGNOSIS — J449 Chronic obstructive pulmonary disease, unspecified: Secondary | ICD-10-CM | POA: Diagnosis not present

## 2015-04-20 MED ORDER — GLYCOPYRROLATE-FORMOTEROL 9-4.8 MCG/ACT IN AERO: 2.0000 | INHALATION_SPRAY | Freq: Two times a day (BID) | RESPIRATORY_TRACT | Status: DC

## 2015-04-20 MED ORDER — PREDNISONE 10 MG PO TABS: ORAL_TABLET | ORAL | Status: DC

## 2015-04-20 NOTE — Progress Notes (Signed)
Subjective:    Patient ID: [name], male    DOB: October 16, 1955,    MRN: [id number]    Brief patient profile:  [id number] quit smoking 2009 followed by [name] previously and referred by Dr [name] Section 07/10/2014  With previously documented COPD GOLD II with min reversibility 12/06/09  But progressed to GOLD III by pfts 08/2014    History of Present Illness   07/10/2014  Pulmonary consultation// [name]  Prev in 2011 GOLD II copd /min reversibility and s/p RUL 2014 and RT Chief Complaint  Patient presents with  . Pulmonary Consult    Former patient of Dr. [name]- last seen Nov 2014. He is here for medication refills. Pt states that his breathing has been worse for the past 2 months. He is using ventolin about 2 x per day and also albuterol neb 2 x per day on average.    breathing was last "better"  Until around Christmas = could walk a mall but sill using saba at least twice daily even on best days  Can't lie on L side  s sob at baseline Doe gradually worse where now gives out x 50 ft flat Assoc with mild nasal  congestion but no purulent secretions Trouble with am cough/ wheeze/ congestion  rec Plan A =  Automatic = symbicort 160 Take 2 puffs first thing in am and then another 2 puffs about 12 hours later.  Plan B= backup  Only use your albuterol (ventolin)  Plan C = crisis  Only use your nebulizer =albuterol 2.5 mg every 4 hours if you try B and it doesn't work Please remember to go to the  x-ray department downstairs for your tests - we will call you with the results when they are available. Please schedule a follow up office visit in 6 weeks, call sooner if needed with pfts       08/28/2014 f/u ov/[name] re: GOLD III copd on advair dpi  Bid but usually needs albterol each am to get his day started  Chief Complaint  Patient presents with  . Follow-up    PFT done today. Pt states that his breathing has improved slightly since the last visit. He is using albuterol inhaler 1 x daily on  average and neb about 4 x per wk.    greatest concern is early am cough/ congestion/ wheezing on advair and freq saba  rec Stop advair and start symbicort 160 x 2 / tudorza x 1 first thing in AM and repeat both  12 hours later Call with formulary substitutions if needed       01/04/2015 acute  ov/[name] re:  Chief Complaint  Patient presents with  . Acute Visit    Pt c/o increased SOB x 2 wks.  He states that he gets SOB walking from room to room at home. He was out of breath walking from lobby to exam room today. He also c/o right side CP "feels like a knife" x 3 days.   last dose tudorza one week prior to OV  - doe x min activity but / fine at rest/ fine sleeping  Maybe used saba twice daily since onset  Pain is R sided ant > post assoc with chronic dysphagia/cough to vomit  but can't have dilation due to plavix / not pleuritic rec Plan A = Automatic = symbicort 160 / tudorza twice daily  Plan B= Backup - Only use your albuterol as a rescue medication  Plan C = Crisis  -  only use neb if you try inhaler first and it doesn't work but ok to use up to every 4 hours Pantoprazole 40 mg Take 30- 60 min before your first and last meals of the day  GERD diet   For cough > use phergan with codeine 2 tsp every 4 hours office visit in 2 weeks, sooner if needed and bring all active meds with you    01/18/2015 f/u ov/[name] re: GOLD III/ symb and tudorza maint rx/ no need for saba  Chief Complaint  Patient presents with  . Follow-up    Pt states that his breathing has improved back to baseline. He is not coughing as much and occ produces some clear sputum. CP has resolved.    No longer needing  Cough suppression MMRC1 = can walk nl pace, flat grade, can't hurry or go uphills or steps s sob   rec Incruse one click each am but take 2 deep drags Please schedule a follow up visit in 3 months but call sooner if needed - bring forumlary list if not happy with your medications     04/20/2015  f/u  ov/[name] re: GOLD III copd/ problems affording meds  Chief Complaint  Patient presents with  . Follow-up    Pt following for COPD: pt states he is still " fighting for breath "  sometimes. pt states the medication you last perscribed him he can not afford so he has been going without. pt c/o SOB, wheezing , prod cough constantly clear in color.   Coughing/ choking spells on dpi's daytime only   No obvious day to day or daytime variabilty or assoc chest tightness, subjective wheeze overt sinus or hb symptoms. No unusual exp hx or h/o childhood pna/ asthma or knowledge of premature birth.  Sleeping ok without nocturnal  or early am exacerbation  of respiratory  c/o's or need for noct saba. Also denies any obvious fluctuation of symptoms with weather or environmental changes or other aggravating or alleviating factors except as outlined above   Current Medications, Allergies, Complete Past Medical History, Past Surgical History, Family History, and Social History were reviewed in [employer] record.  ROS  The following are not active complaints unless bolded sore throat, dysphagia, dental problems, itching, sneezing,  nasal congestion or excess/ purulent secretions, ear ache,   fever, chills, sweats, unintended wt loss,  exertional cp, hemoptysis,  orthopnea pnd or leg swelling, presyncope, palpitations, heartburn, abdominal pain, anorexia, nausea, vomiting, diarrhea  or change in bowel or urinary habits, change in stools or urine, dysuria,hematuria,  rash, arthralgias, visual complaints, headache, numbness weakness or ataxia or problems with walking or coordination,  change in mood/affect or memory.          Objective:   Physical Exam     amb wm nad with freq throat clearing     08/28/2014       203 > 11/27/2014  212 >  01/04/2015 212  > 01/18/2015 212 >  04/20/2015  216     04/24/14 206 lb 12.8 oz (93.804 kg)  02/02/14 202 lb 11.2 oz (91.944 kg)  11/30/13 202 lb 4 oz  (91.74 kg)    Vital signs reviewed    HEENT: nl dentition, turbinates, and orophanx. Nl external ear canals without cough reflex   NECK :  without JVD/Nodes/TM/ nl carotid upstrokes bilaterally   LUNGS: no acc muscle use, distant bs bilaterally  With minimal exp wheeze   CV:  RRR  no  s3 or murmur or increase in P2, no edema   ABD:  soft and nontender with nl excursion in the supine position. No bruits or organomegaly, bowel sounds nl  MS:  warm without deformities, calf tenderness, cyanosis or clubbing  SKIN: warm and dry without lesions    NEURO:  alert, approp, no deficits      CXR PA and Lateral:   01/04/2015 :     I personally reviewed images and agree with radiology impression as follows:    There is no evidence of progressive disease nor other acute cardiopulmonary abnormality.       Assessment & Plan:

## 2015-04-20 NOTE — Patient Instructions (Addendum)
Prednisone 10 mg take  4 each am x 2 days,   2 each am x 2 days,  1 each am x 2 days and stop   Stop symbicort and start Bevespi Take 2 puffs first thing in am and then another 2 puffs about 12 hours later.   Only use your albuterol (ventiolin) as a rescue medication to be used if you can't catch your breath by resting or doing a relaxed purse lip breathing pattern.  - The less you use it, the better it will work when you need it. - Ok to use up to 2 puffs  every 4 hours if you must but call for immediate appointment if use goes up over your usual need - Don't leave home without it !!  (think of it like the spare tire for your car)   Only use your neb if the ventolin doesn't work   Make sure you  Are taking the pantoprazole 40 Take 30- 60 min before your first and last meals of the day   GERD (REFLUX)  is an extremely common cause of respiratory symptoms just like yours , many times with no obvious heartburn at all.    It can be treated with medication, but also with lifestyle changes including elevation of the head of your bed (ideally with 6 inch  bed blocks),  Smoking cessation, avoidance of late meals, excessive alcohol, and avoid fatty foods, chocolate, peppermint, colas, red wine, and acidic juices such as orange juice.  NO MINT OR MENTHOL PRODUCTS SO NO COUGH DROPS  USE SUGARLESS CANDY INSTEAD (Jolley ranchers or Stover's or Life Savers) or even ice chips will also do - the key is to swallow to prevent all throat clearing. NO OIL BASED VITAMINS - use powdered substitutes.  See [employer] and me info for free meds from [facility] if you qualify     Please schedule a follow up office visit in 4 weeks, sooner if needed to see Tammy NP with all medications and formulary in hand

## 2015-04-22 NOTE — Assessment & Plan Note (Addendum)
-   quit smoking 2009 - 12/06/09 PFTs FEV1  1.75 (51%) p 14% improvement from saba and ratio 40 and dlco 54 corrects to 75% - 07/10/2014  Walked RA  2 laps @ 185 ft each stopped due to  Sob, no desat moderate pace  05/10/2015 p extensive coaching HFA effectiveness =    75% > try symbicort 160 2bid  - PFTs 08/28/14  FEV1  1.26 (37%) ratio 31 p 24% better p alb and DLCO 26% - 01/04/2015 p extensive coaching HFA effectiveness =    90%  - Incruse trial 01/18/2015 due to insurance restrictions   Symptoms very poorly controlled.  DDX of  difficult airways management all start with A and  include Adherence, Ace Inhibitors, Acid Reflux, Active Sinus Disease, Alpha 1 Antitripsin deficiency, Anxiety masquerading as Airways dz,  ABPA,  allergy(esp in young), Aspiration (esp in elderly), Adverse effects of meds,  Active smokers, A bunch of PE's (a small clot burden can't cause this syndrome unless there is already severe underlying pulm or vascular dz with poor reserve) plus two Bs  = Bronchiectasis and Beta blocker use..and one C= CHF   Adherence is always the initial "prime suspect" and is a multilayered concern that requires a "trust but verify" approach in every patient - starting with knowing how to use medications, especially inhalers, correctly, keeping up with refills and understanding the fundamental difference between maintenance and prns vs those medications only taken for a very short course and then stopped and not refilled.  - - The proper method of use, as well as anticipated side effects, of a metered-dose inhaler are discussed and demonstrated to the patient. Improved effectiveness after extensive coaching during this visit to a level of approximately 75 % from a baseline of 50 % - limited by short Ti  - issues of paying for deductibles > best option is combination laba/lama = one deductible > try sample of bivespi 2bid  ? Acid (or non-acid) GERD > always difficult to exclude as up to 75% of pts in some  series report no assoc GI/ Heartburn symptoms> rec max (24h)  acid suppression and diet restrictions/ reviewed and instructions given in writing.   ? Allergy/ doubt but airways inflammation likely present > Prednisone 10 mg take  4 each am x 2 days,   2 each am x 2 days,  1 each am x 2 days and stop   ? BB effects > concerned with high doses of lopressor > Strongly prefer in this setting: Bystolic, the most beta -1  selective Beta blocker available in sample form, with bisoprolol the most selective generic choice  on the market.  Will consider change next ov   I had an extended discussion with the patient reviewing all relevant studies completed to date and  lasting 15 to 20 minutes of a 25 minute visit    Each maintenance medication was reviewed in detail including most importantly the difference between maintenance and prns and under what circumstances the prns are to be triggered using an action plan format that is not reflected in the computer generated alphabetically organized AVS.    Please see instructions for details which were reviewed in writing and the patient given a copy highlighting the part that I personally wrote and discussed at today's ov.

## 2015-04-22 NOTE — Assessment & Plan Note (Signed)
Complicated by dm/ hyperlipidemia/hbp  Body mass index is 30.99 trending up .  Lab Results  Component Value Date   TSH 0.971 04/30/2013     Contributing to gerd tendency/ doe/reviewed the need and the process to achieve and maintain neg calorie balance > defer f/u primary care including intermittently monitoring thyroid status

## 2015-04-27 ENCOUNTER — Other Ambulatory Visit: Payer: Self-pay | Admitting: Cardiology

## 2015-05-15 ENCOUNTER — Telehealth: Payer: Self-pay | Admitting: Internal Medicine

## 2015-05-15 MED ORDER — ALBUTEROL SULFATE HFA 108 (90 BASE) MCG/ACT IN AERS: 2.0000 | INHALATION_SPRAY | Freq: Four times a day (QID) | RESPIRATORY_TRACT | Status: AC | PRN

## 2015-05-15 NOTE — Telephone Encounter (Signed)
Spoke with pt. Needs refill on Ventolin. Rx has been sent in. Nothing further was needed.

## 2015-05-18 ENCOUNTER — Encounter: Payer: Self-pay | Admitting: Adult Health

## 2015-05-29 ENCOUNTER — Telehealth: Payer: Self-pay | Admitting: Internal Medicine

## 2015-05-29 NOTE — Telephone Encounter (Signed)
1 sample Bevespi left up front for pick up  Spoke with the pt and notified of this  Nothing further needed

## 2015-06-12 ENCOUNTER — Ambulatory Visit (INDEPENDENT_AMBULATORY_CARE_PROVIDER_SITE_OTHER): Payer: PPO | Admitting: Adult Health

## 2015-06-12 ENCOUNTER — Encounter: Payer: Self-pay | Admitting: Adult Health

## 2015-06-12 VITALS — BP 94/62 | HR 82 | Temp 98.2°F | Ht 70.0 in | Wt 190.0 lb

## 2015-06-12 DIAGNOSIS — C3490 Malignant neoplasm of unspecified part of unspecified bronchus or lung: Secondary | ICD-10-CM | POA: Diagnosis not present

## 2015-06-12 DIAGNOSIS — I2699 Other pulmonary embolism without acute cor pulmonale: Secondary | ICD-10-CM | POA: Diagnosis not present

## 2015-06-12 DIAGNOSIS — J449 Chronic obstructive pulmonary disease, unspecified: Secondary | ICD-10-CM | POA: Diagnosis not present

## 2015-06-12 NOTE — Progress Notes (Signed)
Chart and office note reviewed in detail  > agree with a/p as outlined    

## 2015-06-12 NOTE — Progress Notes (Signed)
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 Pulmonary Consult    Former patient of Dr. [name]- last seen Nov 2014. He is here for medication refills. Pt states that his breathing has been worse for the past 2 months. He is using ventolin about 2 x per day and also albuterol neb 2 x per day on average.    breathing was last "better"  Until around Christmas = could walk a mall but sill using saba at least twice daily even on best days  Can't lie on L side  s sob at baseline Doe gradually worse where now gives out x 50 ft flat Assoc with mild nasal  congestion but no purulent secretions Trouble with am cough/ wheeze/ congestion  rec Plan A =  Automatic = symbicort 160 Take 2 puffs first thing in am and then another 2 puffs about 12 hours later.  Plan B= backup  Only use your albuterol (ventolin)  Plan C = crisis  Only use your nebulizer =albuterol 2.5 mg every 4 hours if you try B and it doesn't work Please remember to go to the  x-ray department downstairs for your tests - we will call you with the results when they are available. Please schedule a follow up office visit in 6 weeks, call sooner if needed with pfts       08/28/2014 f/u ov/[name] re: GOLD III copd on advair dpi  Bid but usually needs albterol each am to get his day started  Chief Complaint  Patient presents with  . Follow-up    PFT done today. Pt states that his breathing has improved slightly since the last visit. He is using albuterol inhaler 1 x daily on  average and neb about 4 x per wk.    greatest concern is early am cough/ congestion/ wheezing on advair and freq saba  rec Stop advair and start symbicort 160 x 2 / tudorza x 1 first thing in AM and repeat both  12 hours later Call with formulary substitutions if needed       01/04/2015 acute  ov/[name] re:  Chief Complaint  Patient presents with  . Acute Visit    Pt c/o increased SOB x 2 wks.  He states that he gets SOB walking from room to room at home. He was out of breath walking from lobby to exam room today. He also c/o right side CP "feels like a knife" x 3 days.   last dose tudorza one week prior to OV  - doe x min activity but / fine at rest/ fine sleeping  Maybe used saba twice daily since onset  Pain is R sided ant > post assoc with chronic dysphagia/cough to vomit  but can't have dilation due to plavix / not pleuritic rec Plan A = Automatic = symbicort 160 / tudorza twice daily  Plan B= Backup - Only use your albuterol as a rescue medication  Plan C = Crisis  -  only use neb if you try inhaler first and it doesn't work but ok to use up to every 4 hours Pantoprazole 40 mg Take 30- 60 min before your first and last meals of the day  GERD diet   For cough > use phergan with codeine 2 tsp every 4 hours office visit in 2 weeks, sooner if needed and bring all active meds with you    01/18/2015 f/u ov/[name] re: GOLD III/ symb and tudorza maint rx/ no need for saba  Chief Complaint  Patient presents with  . Follow-up    Pt states that his breathing has improved back to baseline. He is not coughing as much and occ produces some clear sputum. CP has resolved.    No longer needing  Cough suppression MMRC1 = can walk nl pace, flat grade, can't hurry or go uphills or steps s sob   rec Incruse one click each am but take 2 deep drags Please schedule a follow up visit in 3 months but call sooner if needed - bring forumlary list if not happy with your medications     04/20/2015  f/u  ov/[name] re: GOLD III copd/ problems affording meds  Chief Complaint  Patient presents with  . Follow-up    Pt following for COPD: pt states he is still " fighting for breath "  sometimes. pt states the medication you last perscribed him he can not afford so he has been going without. pt c/o SOB, wheezing , prod cough constantly clear in color.   Coughing/ choking spells on dpi's daytime only  >>changed from Symbicort >Bevespi   06/12/2015 Follow up : COPD -GOLD III /PE /Lung cancer  Pt returns for 6 week follow up for COPD  . Pt says he doing okay overall , has good and bad days.  Has intermittent  SOB with activity, wheezing, chest congestion, and prod cough but is unsure of the color. No hemoptysis or discolored mucus.   Denies any sinus pressure/drainage, chest tightness, fever, nausea or vomiting.  We reviewed his meds and organized them into a med calendar w/ pt education.  Was changed from Symibocrt ot Fraser last ov . Feels it is working better.   Admits not taking Xarelto right now , taking left over Lovenox.  Previously on Lovenox for PE dx 07/2013 and was suppose to go on Xarelto .  Had left over lovenox so taking injections, however taking injection every few days.  Has Xarelto '20mg'$  at home. We discussed restarting this as recommended by cardiology and oncology.   Unable to afford meds, on Medicare , prescriptions are too expensive.  We discussed changing Bivespi, wants to stay on for now . Discussed may need to change to  Nebs going forward.  Pt assistance paperwork given.  Discussed if xarelto is too expensive, can look at [place] paperwork.   Hx of Lung cancer dx 2014 s/p chemo and XRT .  Seen by Oncology , CT chest 02/2015 w/ stable radiation changes, no evidence of reoccurence. No progressive adenopathy. Has planned serial CT chest in April .     Current Medications, Allergies, Complete Past Medical History, Past Surgical History, Family History, and Social History  were reviewed in [employer] record.  ROS  The following are not active complaints unless bolded sore throat, dysphagia, dental problems, itching, sneezing,  nasal congestion or excess/ purulent secretions, ear ache,   fever, chills, sweats, unintended wt loss,  exertional cp, hemoptysis,  orthopnea pnd or  leg swelling, presyncope, palpitations, heartburn, abdominal pain, anorexia, nausea, vomiting, diarrhea  or change in bowel or urinary habits, change in stools or urine, dysuria,hematuria,  rash, arthralgias, visual complaints, headache, numbness weakness or ataxia or problems with walking or coordination,  change in mood/affect or memory.          Objective:   Physical Exam     amb wm nad    08/28/2014       203 > 11/27/2014  212 >  01/04/2015 212  > 01/18/2015 212 >  04/20/2015  216 >> Filed Vitals:   06/12/15 0910  BP: 94/62  Pulse: 82  Temp: 98.2 F (36.8 C)  Height: '5\' 10"'$  (1.778 m)  Weight: 190 lb (86.183 kg)  SpO2: 97%    Vital signs reviewed    HEENT: nl dentition, turbinates, and orophanx. Nl external ear canals without cough reflex   NECK :  without JVD/Nodes/TM/ nl carotid upstrokes bilaterally   LUNGS: no acc muscle use, distant bs bilaterally  CV:  RRR  no s3 or murmur or increase in P2, no edema   ABD:  soft and nontender with nl excursion in the supine position. No bruits or organomegaly, bowel sounds nl  MS:  warm without deformities, calf tenderness, cyanosis or clubbing  SKIN: warm and dry without lesions    NEURO:  alert, approp, no deficits      CXR PA and Lateral:   01/04/2015 :      There is no evidence of progressive disease nor other acute cardiopulmonary abnormality.       Assessment & Plan:

## 2015-06-12 NOTE — Assessment & Plan Note (Signed)
Small cell carcinoma 2014, followed by Oncology  No evidence of dz reoccurence on CT in 02/2015 ,  Follow serial CT chest in 08/2015 as planned

## 2015-06-12 NOTE — Assessment & Plan Note (Signed)
Compensated  Patient's medications were reviewed today and patient education was given. Computerized medication calendar was adjusted/completed Patient assistance papers for [employer]   Plan  ollow med calendar closely and bring to each visit.  Continue on Bivespi 2 puffs Twice daily  , rinse after use.  Fill out assistance program paperwork.  Follow up Dr. Melvyn Novas  In 3 months and As needed   Follow up for CT scan and Dr. Julien Nordmann as planned

## 2015-06-12 NOTE — Assessment & Plan Note (Signed)
Hx of PE in 07/2013 , noncompliant with anticoagulation Discussed complications and dangers of PE reoccurence He is suppose to be on lifelong therapy according to Cardiology and Oncology notes with hx of PE, Cancer and Atrial Fib.  He agrees to restart Xarelto . Will look at pt assistance program if not affordable.

## 2015-06-12 NOTE — Addendum Note (Signed)
Addended by: Osa Craver on: 06/12/2015 10:45 AM   Modules accepted: Orders, Medications

## 2015-06-12 NOTE — Patient Instructions (Addendum)
Follow med calendar closely and bring to each visit.  Restart Xarelto '20mg'$  daily . Do not take lovenox .  Call if any signs of bleeding.  Continue on Bivespi 2 puffs Twice daily  , rinse after use.  Fill out assistance program paperwork.  Follow up Dr. Melvyn Novas  In 3 months and As needed   Follow up for CT scan and Dr. Julien Nordmann as planned

## 2015-06-14 ENCOUNTER — Ambulatory Visit (INDEPENDENT_AMBULATORY_CARE_PROVIDER_SITE_OTHER): Payer: PPO | Admitting: Family Medicine

## 2015-06-14 ENCOUNTER — Encounter: Payer: Self-pay | Admitting: Family Medicine

## 2015-06-14 VITALS — BP 94/70 | HR 101 | Temp 98.2°F | Resp 18 | Wt 210.0 lb

## 2015-06-14 DIAGNOSIS — N183 Chronic kidney disease, stage 3 unspecified: Secondary | ICD-10-CM

## 2015-06-14 DIAGNOSIS — E119 Type 2 diabetes mellitus without complications: Secondary | ICD-10-CM

## 2015-06-14 LAB — POCT GLYCOSYLATED HEMOGLOBIN (HGB A1C)
Est. average glucose Bld gHb Est-mCnc: 140
Hemoglobin A1C: 6.5

## 2015-06-14 NOTE — Progress Notes (Signed)
Patient: [name] Male    DOB: Jan 29, 1956   60 y.o.   MRN: [id number] Visit Date: 06/14/2015  Today's Provider: Lelon Huh, MD   Chief Complaint  Patient presents with  . Follow-up  . Diabetes  . Hypertension  . Chronic Kidney Disease   Subjective:    HPI   Follow-up for chronic kidney disease from11/06/2014; no changes.    Diabetes Mellitus Type II, Follow-up:   Lab Results  Component Value Date   HGBA1C 8.9 03/14/2015   HGBA1C 8.0* 09/25/2014   HGBA1C 5.6 08/21/2013   Last seen for diabetes 3 months ago.  He has some renal impairment on labs by cardiologist in September, and planned on rechecking to see if metfromin needs to be started. We also restarted glipizide. advised to get strict with diet . He reports good compliance with treatment. He is not having side effects. none Current symptoms include none and have been stable. Home blood sugar records: fasting range: 100-120  Episodes of hypoglycemia? no   Current Insulin Regimen: n/a Most Recent Eye Exam: due Weight trend: stable Prior visit with dietician: no Current diet: well balanced Current exercise: none  ----------------------------------------------------------------------   Hypertension, follow-up:  BP Readings from Last 3 Encounters:  06/14/15 94/70  06/12/15 94/62  04/20/15 110/80    He was last seen for hypertension 3 months ago.  BP at that visit was 98/60. Management since that visit includes; no changes, well controlled.He reports good compliance with treatment. He is not having side effects. none  He is not exercising. He is not adherent to low salt diet.   Outside blood pressures are n/a. He is experiencing none.  Patient denies none.   Cardiovascular risk factors include diabetes mellitus.  Use of agents associated with hypertension: none.   ----------------------------------------------------------------------    Allergies  Allergen Reactions  . Fentanyl  Other (See Comments)    Delirium and violent; took with oxycodone and morphine last hospital stay at [facility]  . Azithromycin Other (See Comments)    Reaction while in hospital - pt doesn't know reaction  . Erythromycin Other (See Comments)    Reaction while in hospital - pt doesn't know reaction  . Penicillins Other (See Comments)    Childhood reaction - unknown   Previous Medications   ALBUTEROL (PROVENTIL HFA;VENTOLIN HFA) 108 (90 BASE) MCG/ACT INHALER    Inhale 2 puffs into the lungs every 6 (six) hours as needed for wheezing or shortness of breath.   ALBUTEROL (PROVENTIL) (2.5 MG/3ML) 0.083% NEBULIZER SOLUTION    Take 3 mLs (2.5 mg total) by nebulization every 6 (six) hours as needed for wheezing or shortness of breath.   ASPIRIN EC 81 MG TABLET    Take 81 mg by mouth every morning.    ATORVASTATIN (LIPITOR) 40 MG TABLET    TAKE 1 TABLET BY MOUTH DAILY AT BEDTIME   DEXTROMETHORPHAN-GUAIFENESIN (MUCINEX DM) 30-600 MG 12HR TABLET    Take 1 tablet by mouth 2 (two) times daily as needed (cough and congestion).   GLIPIZIDE (GLUCOTROL) 5 MG TABLET    Take 1 tablet (5 mg total) by mouth daily before breakfast.   GLYCOPYRROLATE-FORMOTEROL (BEVESPI) 9-4.8 MCG/ACT AERO    Inhale 2 puffs into the lungs 2 (two) times daily.   ISOSORBIDE MONONITRATE (IMDUR) 60 MG 24 HR TABLET    TAKE 1 TABLET BY MOUTH EVERY MORNING   METFORMIN (GLUCOPHAGE) 500 MG TABLET    Take 500 mg by mouth daily  with breakfast.    METOPROLOL (LOPRESSOR) 50 MG TABLET    Take 1 tablet (50 mg total) by mouth 2 (two) times daily.   PANTOPRAZOLE (PROTONIX) 40 MG TABLET    TAKE ONE TABLET BY MOUTH TWICE DAILY   RANEXA 500 MG 12 HR TABLET    TAKE ONE TABLET BY MOUTH TWICE DAILY   RIVAROXABAN (XARELTO) 20 MG TABS TABLET    Take 20 mg by mouth daily with supper.    Review of Systems  Constitutional: Negative for fever, chills and appetite change.  Respiratory: Negative for chest tightness, shortness of breath and wheezing.   Cardiovascular:  Negative for chest pain and palpitations.  Gastrointestinal: Negative for nausea, vomiting and abdominal pain.    Social History  Substance Use Topics  . Smoking status: Former Smoker -- 1.00 packs/day for 40 years    Types: Cigarettes    Quit date: 09/09/2008  . Smokeless tobacco: Never Used  . Alcohol Use: 0.0 oz/week    0 Standard drinks or equivalent per week     Comment: occasional use   Objective:   BP 94/70 mmHg  Pulse 101  Temp(Src) 98.2 F (36.8 C) (Oral)  Resp 18  Wt 210 lb (95.255 kg)  SpO2 94%  Physical Exam   General Appearance:    Alert, cooperative, no distress, overweight  Eyes:    PERRL, conjunctiva/corneas clear, EOM's intact       Lungs:     Clear to auscultation bilaterally, respirations unlabored  Heart:    Regular rate and rhythm  Neurologic:   Awake, alert, oriented x 3. No apparent focal neurological           defect.        Results for orders placed or performed in visit on 06/14/15  POCT glycosylated hemoglobin (Hb A1C)  Result Value Ref Range   Hemoglobin A1C 6.5    Est. average glucose Bld gHb Est-mCnc 140        Assessment & Plan:     1. Type 2 diabetes mellitus without complication, without long-term current use of insulin (HCC) Well controlled.  Still on metformin. Check renal panel as below.  - POCT glycosylated hemoglobin (Hb A1C)  2. Chronic kidney disease (CKD), stage III (moderate)  - Renal function panel  Return in about 4 months (around 10/12/2015).      Lelon Huh, MD  [facility]

## 2015-06-20 ENCOUNTER — Other Ambulatory Visit: Payer: Self-pay | Admitting: Cardiology

## 2015-06-20 ENCOUNTER — Telehealth: Payer: Self-pay

## 2015-06-20 ENCOUNTER — Telehealth: Payer: Self-pay | Admitting: Adult Health

## 2015-06-20 NOTE — Telephone Encounter (Signed)
Signed.

## 2015-06-20 NOTE — Telephone Encounter (Signed)
Forms have been faxed back for patient assistance. Will make sure [name] is aware.  Nothing further needed

## 2015-06-20 NOTE — Telephone Encounter (Signed)
Patient assistance forms received and placed in MW look at. Please advise thanks

## 2015-06-20 NOTE — Telephone Encounter (Signed)
I see the issue but if he's not aware of a clinical change from baseline then no need to move up the f/u already planned

## 2015-06-20 NOTE — Telephone Encounter (Signed)
Patient came to office dropping off paper work accompanied by cousin.  Registration check out staff concerned because patient was very short of breath and asked me assigned to walk ins to come see him Patient is short of breath, talks only in short sentences before he needs to take a breath Refuses to use WC for walk to nurse's room Patient is short of breath as mentioned above with 90% O2 sat, BP 122/70 HR 116 R 36 Weight = 204 lbs Sin warm and dry, color good, has slight use of secondary accessory muscles along with pursed lip breathing while sitting up and slightly forward on exam table.  Patient said he thinks he is getting a cold Lungs aerate well and equal bilaterally with light inspiratory and expiratory wheeze.  Used his atrovent inhaler 30 minutes ago Patient said that he isn't here to be seen, just to drop papers off. He typically gets this short of breath with exertion.  He says his breathing always improves with rest. Seen his Pulmonologist in the last [redacted] weeks along with his PCP.  He was started on a new inhaler.  Uses nebs at home. Patient states that he has COPD and according to his record he has history of small cell CA lung. In 2015 he was diagnosed with Pulmonary emboli with poor compliance with his anticoagulation.  Patient says he doesn't use O2 because medicare will not cover it. Discussed above findings, patients VS, symptoms and history with Ellen Henri PA C Patient is going to go to Dr. Gustavus Bryant office next to drop paper work off. I told him that I was going to send over a note to Dr. Melvyn Novas with his symptoms.  I asked him if he could talk to clinical staff about his symptoms and feeling he is coming down with a cold I asked patient if it was ok to wheel him to his car along with cousin; he agreed.  He is now able to talk in complete sentences, still sits slightly forward, O2 sat 91%. Says it always calms down and when he goes to see Dr. Martin Majestic everything is okay. Wheeled to car  with cousin.  Their next stop is Dr. Jerene Bears office.  Patient agrees to talk to clinical staff when he gets there

## 2015-06-20 NOTE — Telephone Encounter (Signed)
Spoke with pt. He scheduled acute visit tomorrow with MW. Nothing further needed

## 2015-06-21 ENCOUNTER — Other Ambulatory Visit (INDEPENDENT_AMBULATORY_CARE_PROVIDER_SITE_OTHER): Payer: Self-pay

## 2015-06-21 ENCOUNTER — Encounter: Payer: Self-pay | Admitting: Internal Medicine

## 2015-06-21 ENCOUNTER — Ambulatory Visit (INDEPENDENT_AMBULATORY_CARE_PROVIDER_SITE_OTHER): Payer: PPO | Admitting: Internal Medicine

## 2015-06-21 ENCOUNTER — Ambulatory Visit (INDEPENDENT_AMBULATORY_CARE_PROVIDER_SITE_OTHER)
Admission: RE | Admit: 2015-06-21 | Discharge: 2015-06-21 | Disposition: A | Discharge status: Home / Self Care | Payer: PPO | Source: Ambulatory Visit | Attending: Internal Medicine | Admitting: Internal Medicine

## 2015-06-21 VITALS — BP 128/78 | HR 96 | Temp 97.4°F | Ht 70.0 in | Wt 204.0 lb

## 2015-06-21 DIAGNOSIS — J449 Chronic obstructive pulmonary disease, unspecified: Secondary | ICD-10-CM

## 2015-06-21 DIAGNOSIS — R0600 Dyspnea, unspecified: Secondary | ICD-10-CM | POA: Diagnosis not present

## 2015-06-21 DIAGNOSIS — I10 Essential (primary) hypertension: Secondary | ICD-10-CM | POA: Diagnosis not present

## 2015-06-21 LAB — CBC WITH DIFFERENTIAL/PLATELET
BASOS ABS: 0 10*3/uL (ref 0.0–0.1)
Basophils Relative: 0.2 % (ref 0.0–3.0)
EOS ABS: 0.1 10*3/uL (ref 0.0–0.7)
Eosinophils Relative: 1.7 % (ref 0.0–5.0)
HCT: 39.2 % (ref 39.0–52.0)
Hemoglobin: 13.1 g/dL (ref 13.0–17.0)
LYMPHS ABS: 0.5 10*3/uL — AB (ref 0.7–4.0)
Lymphocytes Relative: 8.7 % — ABNORMAL LOW (ref 12.0–46.0)
MCHC: 33.3 g/dL (ref 30.0–36.0)
MCV: 96.3 fl (ref 78.0–100.0)
Monocytes Absolute: 0.4 10*3/uL (ref 0.1–1.0)
Monocytes Relative: 5.9 % (ref 3.0–12.0)
NEUTROS ABS: 5.3 10*3/uL (ref 1.4–7.7)
NEUTROS PCT: 83.5 % — AB (ref 43.0–77.0)
PLATELETS: 280 10*3/uL (ref 150.0–400.0)
RBC: 4.07 Mil/uL — AB (ref 4.22–5.81)
RDW: 15.7 % — ABNORMAL HIGH (ref 11.5–15.5)
WBC: 6.3 10*3/uL (ref 4.0–10.5)

## 2015-06-21 LAB — BASIC METABOLIC PANEL
BUN: 23 mg/dL (ref 6–23)
CALCIUM: 9.4 mg/dL (ref 8.4–10.5)
CHLORIDE: 100 meq/L (ref 96–112)
CO2: 26 mEq/L (ref 19–32)
Creatinine, Ser: 2.21 mg/dL — ABNORMAL HIGH (ref 0.40–1.50)
GFR: 32.47 mL/min — AB (ref >60.00)
Glucose, Bld: 100 mg/dL — ABNORMAL HIGH (ref 70–99)
Potassium: 4.8 mEq/L (ref 3.5–5.1)
Sodium: 134 mEq/L — ABNORMAL LOW (ref 135–145)

## 2015-06-21 LAB — BRAIN NATRIURETIC PEPTIDE: PRO B NATRI PEPTIDE: 474 pg/mL — AB (ref 0.0–100.0)

## 2015-06-21 LAB — TSH: TSH: 1.89 u[IU]/mL (ref 0.35–4.50)

## 2015-06-21 MED ORDER — PREDNISONE 10 MG PO TABS: ORAL_TABLET | ORAL | Status: AC

## 2015-06-21 MED ORDER — NEBIVOLOL HCL 10 MG PO TABS: 10.0000 mg | ORAL_TABLET | Freq: Every day | ORAL | Status: AC

## 2015-06-21 NOTE — Progress Notes (Signed)
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 Pulmonary Consult    Former patient of Dr. [name]- last seen Nov 2014. He is here for medication refills. Pt states that his breathing has been worse for the past 2 months. He is using ventolin about 2 x per day and also albuterol neb 2 x per day on average.    breathing was last "better"  Until around Christmas = could walk a mall but sill using saba at least twice daily even on best days  Can't lie on L side  s sob at baseline Doe gradually worse where now gives out x 50 ft flat Assoc with mild nasal  congestion but no purulent secretions Trouble with am cough/ wheeze/ congestion  rec Plan A =  Automatic = symbicort 160 Take 2 puffs first thing in am and then another 2 puffs about 12 hours later.  Plan B= backup  Only use your albuterol (ventolin)  Plan C = crisis  Only use your nebulizer =albuterol 2.5 mg every 4 hours if you try B and it doesn't work Please remember to go to the  x-ray department downstairs for your tests - we will call you with the results when they are available. Please schedule a follow up office visit in 6 weeks, call sooner if needed with pfts       08/28/2014 f/u ov/[name] re: GOLD III copd on advair dpi  Bid but usually needs albterol each am to get his day started  Chief Complaint  Patient presents with  . Follow-up    PFT done today. Pt states that his breathing has improved slightly since the last visit. He is using albuterol inhaler 1 x daily on  average and neb about 4 x per wk.    greatest concern is early am cough/ congestion/ wheezing on advair and freq saba  rec Stop advair and start symbicort 160 x 2 / tudorza x 1 first thing in AM and repeat both  12 hours later Call with formulary substitutions if needed       01/04/2015 acute  ov/[name] re:  Chief Complaint  Patient presents with  . Acute Visit    Pt c/o increased SOB x 2 wks.  He states that he gets SOB walking from room to room at home. He was out of breath walking from lobby to exam room today. He also c/o right side CP "feels like a knife" x 3 days.   last dose tudorza one week prior to OV  - doe x min activity but / fine at rest/ fine sleeping  Maybe used saba twice daily since onset  Pain is R sided ant > post assoc with chronic dysphagia/cough to vomit  but can't have dilation due to plavix / not pleuritic rec Plan A = Automatic = symbicort 160 / tudorza twice daily  Plan B= Backup - Only use your albuterol as a rescue medication  Plan C = Crisis  -  only use neb if you try inhaler first and it doesn't work but ok to use up to every 4 hours Pantoprazole 40 mg Take 30- 60 min before your first and last meals of the day  GERD diet   For cough > use phergan with codeine 2 tsp every 4 hours office visit in 2 weeks, sooner if needed and bring all active meds with you    01/18/2015 f/u ov/[name] re: GOLD III/ symb and tudorza maint rx/ no need for saba  Chief Complaint  Patient presents with  . Follow-up    Pt states that his breathing has improved back to baseline. He is not coughing as much and occ produces some clear sputum. CP has resolved.    No longer needing  Cough suppression MMRC1 = can walk nl pace, flat grade, can't hurry or go uphills or steps s sob   rec Incruse one click each am but take 2 deep drags Please schedule a follow up visit in 3 months but call sooner if needed - bring forumlary list if not happy with your medications     04/20/2015  f/u  ov/[name] re: GOLD III copd/ problems affording meds  Chief Complaint  Patient presents with  . Follow-up    Pt following for COPD: pt states he is still " fighting for breath "  sometimes. pt states the medication you last perscribed him he can not afford so he has been going without. pt c/o SOB, wheezing , prod cough constantly clear in color.   Coughing/ choking spells on dpi's daytime only  >>changed from Symbicort >Bevespi     06/12/2015 NP  Follow up : COPD -GOLD III /PE /Lung cancer   Pt says he doing okay overall , has good and bad days.  Has intermittent  SOB with activity, wheezing, chest congestion, and prod cough but is unsure of the color. No hemoptysis or discolored mucus.  Denies any sinus pressure/drainage, chest tightness, fever, nausea or vomiting.  We reviewed his meds and organized them into a med calendar w/ pt education.  Was changed from Symibcort to  [place] last ov . Feels it is working better.  Admits not taking Xarelto right now , taking left over Lovenox.  Previously on Lovenox for PE dx 07/2013 and was suppose to go on Xarelto .  Had left over lovenox so taking injections, however taking injection every few days.  Has Xarelto '20mg'$  at home. We discussed restarting this as recommended by cardiology and oncology.  Unable to afford meds, on Medicare , prescriptions are too expensive.  We discussed changing Bivespi, wants to stay on for now . Discussed may need to change to  Nebs going forward.  Pt assistance paperwork given.  Discussed if xarelto is too expensive, can look at [place] paperwork.  Hx of Lung cancer dx 2014 s/p chemo and XRT .  Seen by Oncology , CT chest 02/2015 w/ stable radiation changes, no evidence of reoccurence. No progressive adenopathy. Has planned serial CT chest in April .  rec Follow med calendar closely and bring to each visit.  Restart Xarelto '20mg'$  daily . Do not take lovenox .  Call if any signs of bleeding.  Continue on Bivespi 2  puffs Twice daily  , rinse after use.     06/21/2015 acute extended ov/[name] re: GOLD II copd/ maint rx [place]   Chief Complaint  Patient presents with  . Acute Visit    Pt presents with worsening sob with any  exertion, chest congestion, prod cough with clear mucus Xfew weeks  no sob at rest/ difficulty on L side down since starting chemo / RT but ok on R  No 02  Last ventolin  at arrival to office/ has neb not using Onset sob was gradual x sev weeks, min progressive   No obvious day to day or daytime variability or assoc excess/ purulent sputum or mucus plugs or hemoptysis or cp or chest tightness, subjective wheeze or overt sinus or hb symptoms. No unusual exp hx or h/o childhood pna/ asthma or knowledge of premature birth.  Sleeping ok without nocturnal  or early am exacerbation  of respiratory  c/o's or need for noct saba. Also denies any obvious fluctuation of symptoms with weather or environmental changes or other aggravating or alleviating factors except as outlined above   Current Medications, Allergies, Complete Past Medical History, Past Surgical History, Family History, and Social History were reviewed in [employer] record.  ROS  The following are not active complaints unless bolded sore throat, dysphagia, dental problems, itching, sneezing,  nasal congestion or excess/ purulent secretions, ear ache,   fever, chills, sweats, unintended wt loss, classically pleuritic or exertional cp,  orthopnea pnd or leg swelling, presyncope, palpitations, abdominal pain, anorexia, nausea, vomiting, diarrhea  or change in bowel or bladder habits, change in stools or urine, dysuria,hematuria,  rash, arthralgias, visual complaints, headache, numbness, weakness or ataxia or problems with walking or coordination,  change in mood/affect or memory.                   Objective:   Physical Exam     amb wm nad    08/28/2014       203 > 11/27/2014  212 >  01/04/2015 212  >  01/18/2015 212 >  04/20/2015  216 >>  06/21/2015   204   Vital signs reviewed    HEENT: nl dentition, turbinates, and orophanx. Nl external ear canals without cough reflex   NECK :  without JVD/Nodes/TM/ nl carotid upstrokes bilaterally   LUNGS: no acc muscle use, distant bs bilaterally  CV:  RRR  no s3 or murmur or increase in P2,  Trace  edema   ABD:  soft and nontender with nl excursion in the supine position. No bruits or organomegaly, bowel sounds nl  MS:  warm without deformities, calf tenderness, cyanosis or clubbing  SKIN: warm and dry without lesions    NEURO:  alert, approp, no deficits    CXR PA and Lateral:   06/21/2015 :    I personally reviewed images and agree with radiology impression as follows:    Cardiac shadow is stable in appearance. Coronary stenting is again noted. Considerable post radiation scarring is noted similar to that seen on prior exams. Emphysematous changes are noted. Increased density is noted in the lateral right lung base. This likely represents some progression of scarring seen previously although the possibility of acute on chronic infiltrate could not be totally excluded. Chronic changes in the right chest wall are again noted. No new focal abnormality is seen. A nipple shadow is again noted over the left base.       Labs ordered/ reviewed:      Chemistry      Component Value Date/Time   NA 134* 06/21/2015 1236   NA 136 02/20/2015 0941   NA 135* 09/25/2014   K 4.8 06/21/2015 1236   K 4.4 02/20/2015 0941   CL 100 06/21/2015  1236   CO2 26 06/21/2015 1236   CO2 27 02/20/2015 0941   BUN 23 06/21/2015 1236   BUN 18.2 02/20/2015 0941   BUN 18 09/25/2014   CREATININE 2.21* 06/21/2015 1236   CREATININE 1.8* 02/20/2015 0941   CREATININE 1.6* 09/25/2014   CREATININE 1.46* 12/10/2010 0843   GLU 269 09/25/2014      Component Value Date/Time   CALCIUM 9.4 06/21/2015 1236   CALCIUM 9.0 02/20/2015 0941   ALKPHOS 103 02/20/2015 0941    ALKPHOS 104 08/03/2014 1101   AST 10 02/20/2015 0941   AST 22 08/03/2014 1101   ALT 12 02/20/2015 0941   ALT 14 08/03/2014 1101   BILITOT 0.92 02/20/2015 0941   BILITOT 0.9 08/03/2014 1101        Lab Results  Component Value Date   WBC 6.3 06/21/2015   HGB 13.1 06/21/2015   HCT 39.2 06/21/2015   MCV 96.3 06/21/2015   PLT 280.0 06/21/2015       Lab Results  Component Value Date   TSH 1.89 06/21/2015     Lab Results  Component Value Date   PROBNP 474.0* 06/21/2015         Assessment & Plan:   Outpatient Encounter Prescriptions as of 06/21/2015  Medication Sig  . albuterol (PROVENTIL HFA;VENTOLIN HFA) 108 (90 Base) MCG/ACT inhaler Inhale 2 puffs into the lungs every 6 (six) hours as needed for wheezing or shortness of breath. (Patient taking differently: Inhale 2 puffs into the lungs every 4 (four) hours as needed for wheezing or shortness of breath (((PLAN B))). )  . albuterol (PROVENTIL) (2.5 MG/3ML) 0.083% nebulizer solution Take 3 mLs (2.5 mg total) by nebulization every 6 (six) hours as needed for wheezing or shortness of breath. (Patient taking differently: Take 2.5 mg by nebulization every 4 (four) hours as needed for wheezing or shortness of breath (((PLAN C))). )  . aspirin EC 81 MG tablet Take 81 mg by mouth every morning.   Marland Kitchen atorvastatin (LIPITOR) 40 MG tablet TAKE 1 TABLET BY MOUTH DAILY AT BEDTIME  . dextromethorphan-guaiFENesin (MUCINEX DM) 30-600 MG 12hr tablet Take 1 tablet by mouth 2 (two) times daily as needed (cough and congestion).  Marland Kitchen glipiZIDE (GLUCOTROL) 5 MG tablet Take 1 tablet (5 mg total) by mouth daily before breakfast.  . Glycopyrrolate-Formoterol (BEVESPI) 9-4.8 MCG/ACT AERO Inhale 2 puffs into the lungs 2 (two) times daily.  . isosorbide mononitrate (IMDUR) 60 MG 24 hr tablet TAKE 1 TABLET BY MOUTH EVERY MORNING  . metFORMIN (GLUCOPHAGE) 500 MG tablet Take 500 mg by mouth daily with breakfast.   . pantoprazole (PROTONIX) 40 MG tablet TAKE ONE  TABLET BY MOUTH TWICE DAILY (Patient taking differently: TAKE ONE TABLET BY MOUTH DAILY)  . RANEXA 500 MG 12 hr tablet TAKE ONE TABLET BY MOUTH TWICE DAILY  . rivaroxaban (XARELTO) 20 MG TABS tablet Take 20 mg by mouth daily with supper.  . [DISCONTINUED] metoprolol (LOPRESSOR) 50 MG tablet TAKE 1 TABLET BY MOUTH TWICE A DAY  . nebivolol (BYSTOLIC) 10 MG tablet Take 1 tablet (10 mg total) by mouth daily.  . predniSONE (DELTASONE) 10 MG tablet Take  4 each am x 2 days,   2 each am x 2 days,  1 each am x 2 days and stop   No facility-administered encounter medications on file as of 06/21/2015.

## 2015-06-21 NOTE — Patient Instructions (Addendum)
Stop lopressor and start bystolic 10 mg daily in its place  Prednisone 10 mg take  4 each am x 2 days,   2 each am x 2 days,  1 each am x 2 days and stop   Please remember to go to the lab and x-ray department downstairs for your tests - we will call you with the results when they are available.     See Tammy NP w/in 2 weeks with all your medications to check it against your med calendar

## 2015-06-22 ENCOUNTER — Telehealth: Payer: Self-pay | Admitting: Adult Health

## 2015-06-22 NOTE — Progress Notes (Signed)
Quick Note:  Spoke with pt and notified of results per Dr. [name]. Pt verbalized understanding and denied any questions.  ______ 

## 2015-06-22 NOTE — Telephone Encounter (Signed)
Per 06/21/15 OV: Patient Instructions       Stop lopressor and start bystolic 10 mg daily in its place Prednisone 10 mg take  4 each am x 2 days,   2 each am x 2 days,  1 each am x 2 days and stop  Please remember to go to the lab and x-ray department downstairs for your tests - we will call you with the results when they are available. See Tammy NP w/in 2 weeks with all your medications to check it against your medcalendar    ---  Called spoke with pt. Calling to confirm bystolic is only once daily. I advised this was correct. Nothing further needed

## 2015-06-24 NOTE — Assessment & Plan Note (Addendum)
- quit smoking 2009 - 12/06/09 PFTs FEV1  1.75 (51%) p 14% improvement from saba and ratio 40 and dlco 54 corrects to 75% - 07/10/2014  Walked RA  2 laps @ 185 ft each stopped due to  Sob, no desat moderate pace  05/10/2015 p extensive coaching HFA effectiveness =    75% > try symbicort 160 2bid  - PFTs 08/28/14  FEV1  1.26 (37%) ratio 31 p 24% better p alb and DLCO 26% - Incruse trial 01/18/2015 due to insurance restrictions  - med calendar 06/12/2015  - BEVESPI trial  04/20/15    Symptoms are markedly disproportionate to objective findings and not clear this is all a  lung problem but pt does appear to have difficult airway management issues. DDX of  difficult airways management almost all start with A and  include Adherence, Ace Inhibitors, Acid Reflux, Active Sinus Disease, Alpha 1 Antitripsin deficiency, Anxiety masquerading as Airways dz,  ABPA,  Allergy(esp in young), Aspiration (esp in elderly), Adverse effects of meds,  Active smokers, A bunch of PE's (a small clot burden can't cause this syndrome unless there is already severe underlying pulm or vascular dz with poor reserve) plus two Bs  = Bronchiectasis and Beta blocker use..and one C= CHF   Adherence is always the initial "prime suspect" and is a multilayered concern that requires a "trust but verify" approach in every patient - starting with knowing how to use medications, especially inhalers, correctly, keeping up with refills and understanding the fundamental difference between maintenance and prns vs those medications only taken for a very short course and then stopped and not refilled.  - - The proper method of use, as well as anticipated side effects, of a metered-dose inhaler are discussed and demonstrated to the patient. Improved effectiveness after extensive coaching during this visit to a level of approximately 90 % from a baseline of 75 %  - need a trust but verify approach here >  To keep things simple, I have asked the patient to  first separate medicines that are perceived as maintenance, that is to be taken daily "no matter what", from those medicines that are taken on only on an as-needed basis and I have given the patient examples of both, and then return to see our NP review/ correlate with a  detailed  medication calendar which should be followed until the next physician sees the patient and updates it.   ? Acid (or non-acid) GERD > always difficult to exclude as up to 75% of pts in some series report no assoc GI/ Heartburn symptoms> rec continue max (24h)  acid suppression and diet restrictions/ reviewed     ? Allergy / asthma component > Prednisone 10 mg take  4 each am x 2 days,   2 each am x 2 days,  1 each am x 2 days and stop  ? A bunch of pe's very unlikely on xarelto chronically   ? BB effect > try change to bystolic - see hpb (see separate a/p)   I had an extended discussion with the patient reviewing all relevant studies completed to date and  lasting 15 to 20 minutes of a 25 minute visit    Each maintenance medication was reviewed in detail including most importantly the difference between maintenance and prns and under what circumstances the prns are to be triggered using an action plan format that is not reflected in the computer generated alphabetically organized AVS but trather by a customized med  calendar that reflects the AVS meds with confirmed 100% correlation.   Please see instructions for details which were reviewed in writing and the patient given a copy highlighting the part that I personally wrote and discussed at today's ov.

## 2015-06-24 NOTE — Assessment & Plan Note (Signed)
Can't exclude element of chf here but clinically not an issue

## 2015-06-24 NOTE — Assessment & Plan Note (Signed)
Strongly prefer in this setting: Bystolic, the most beta -1  selective Beta blocker available in sample form, with bisoprolol the most selective generic choice  on the market.   Changed lopressor to bystolic 10 mg daily

## 2015-06-26 ENCOUNTER — Ambulatory Visit: Payer: Self-pay | Admitting: Internal Medicine

## 2015-07-05 ENCOUNTER — Ambulatory Visit: Payer: Self-pay | Admitting: Adult Health

## 2015-07-11 DEATH — deceased

## 2015-08-08 ENCOUNTER — Telehealth: Payer: Self-pay | Admitting: Medical Oncology

## 2015-08-08 ENCOUNTER — Telehealth: Payer: Self-pay | Admitting: Internal Medicine

## 2015-08-08 NOTE — Telephone Encounter (Signed)
appts cx. Pt passed away

## 2015-08-08 NOTE — Telephone Encounter (Signed)
I spoke to sister Jeannene Patella and pt died in 07-02-2022 "cardiac related".

## 2015-08-10 ENCOUNTER — Telehealth: Payer: Self-pay | Admitting: Internal Medicine

## 2015-08-10 NOTE — Telephone Encounter (Signed)
Received note from desk nurse yesterday in [facility] to cx appointments patient deceased. [place] appointments and scan cxd. Patient status marked as deceased and all other appointments recalled.

## 2015-08-27 ENCOUNTER — Other Ambulatory Visit: Payer: Self-pay

## 2015-09-03 ENCOUNTER — Ambulatory Visit: Payer: Self-pay | Admitting: Internal Medicine

## 2015-09-10 ENCOUNTER — Ambulatory Visit: Payer: Self-pay | Admitting: Internal Medicine

## 2015-10-12 ENCOUNTER — Ambulatory Visit: Payer: PPO | Admitting: Family Medicine

## 2015-10-30 ENCOUNTER — Other Ambulatory Visit: Payer: Self-pay | Admitting: Nurse Practitioner
